# Patient Record
Sex: Male | Born: 1935
Health system: Southern US, Community
[De-identification: ages and names within clinical notes are randomized; demographics above are authoritative.]

## PROBLEM LIST (undated history)

## (undated) DIAGNOSIS — K219 Gastro-esophageal reflux disease without esophagitis: Secondary | ICD-10-CM

## (undated) DIAGNOSIS — J189 Pneumonia, unspecified organism: Secondary | ICD-10-CM

## (undated) DIAGNOSIS — N429 Disorder of prostate, unspecified: Secondary | ICD-10-CM

## (undated) DIAGNOSIS — I499 Cardiac arrhythmia, unspecified: Secondary | ICD-10-CM

## (undated) DIAGNOSIS — K5090 Crohn's disease, unspecified, without complications: Secondary | ICD-10-CM

## (undated) DIAGNOSIS — I509 Heart failure, unspecified: Secondary | ICD-10-CM

## (undated) DIAGNOSIS — G4730 Sleep apnea, unspecified: Secondary | ICD-10-CM

## (undated) DIAGNOSIS — E785 Hyperlipidemia, unspecified: Secondary | ICD-10-CM

## (undated) DIAGNOSIS — I10 Essential (primary) hypertension: Secondary | ICD-10-CM

## (undated) DIAGNOSIS — I219 Acute myocardial infarction, unspecified: Secondary | ICD-10-CM

## (undated) DIAGNOSIS — M1990 Unspecified osteoarthritis, unspecified site: Secondary | ICD-10-CM

## (undated) DIAGNOSIS — I2510 Atherosclerotic heart disease of native coronary artery without angina pectoris: Secondary | ICD-10-CM

## (undated) DIAGNOSIS — F0390 Unspecified dementia without behavioral disturbance: Secondary | ICD-10-CM

## (undated) HISTORY — DX: Gastro-esophageal reflux disease without esophagitis: K21.9

## (undated) HISTORY — PX: CARDIAC CATHETERIZATION: SHX172

## (undated) HISTORY — DX: Unspecified osteoarthritis, unspecified site: M19.90

## (undated) HISTORY — PX: COLONOSCOPY: SHX174

## (undated) HISTORY — DX: Hyperlipidemia, unspecified: E78.5

## (undated) HISTORY — DX: Acute myocardial infarction, unspecified: I21.9

---

## 1974-11-23 HISTORY — PX: KNEE ARTHROSCOPY: SUR90

## 1991-04-25 HISTORY — PX: WRIST SURGERY: SHX841

## 1991-11-24 DIAGNOSIS — J189 Pneumonia, unspecified organism: Secondary | ICD-10-CM

## 1991-11-24 HISTORY — DX: Pneumonia, unspecified organism: J18.9

## 1994-11-23 HISTORY — PX: MENISCUS DEBRIDEMENT: SHX5178

## 2003-07-13 ENCOUNTER — Encounter: Payer: Self-pay | Admitting: Surgery

## 2003-07-13 ENCOUNTER — Encounter: Admission: RE | Admit: 2003-07-13 | Discharge: 2003-07-13 | Payer: Self-pay | Admitting: Surgery

## 2003-07-16 ENCOUNTER — Ambulatory Visit (HOSPITAL_BASED_OUTPATIENT_CLINIC_OR_DEPARTMENT_OTHER): Admission: RE | Admit: 2003-07-16 | Discharge: 2003-07-16 | Payer: Self-pay | Admitting: Surgery

## 2004-12-09 ENCOUNTER — Inpatient Hospital Stay (HOSPITAL_BASED_OUTPATIENT_CLINIC_OR_DEPARTMENT_OTHER): Admission: RE | Admit: 2004-12-09 | Discharge: 2004-12-09 | Payer: Self-pay | Admitting: Interventional Cardiology

## 2007-11-02 ENCOUNTER — Ambulatory Visit: Payer: Self-pay | Admitting: Gastroenterology

## 2007-11-21 DIAGNOSIS — G4730 Sleep apnea, unspecified: Secondary | ICD-10-CM | POA: Insufficient documentation

## 2007-11-21 DIAGNOSIS — D126 Benign neoplasm of colon, unspecified: Secondary | ICD-10-CM

## 2007-11-21 DIAGNOSIS — K219 Gastro-esophageal reflux disease without esophagitis: Secondary | ICD-10-CM | POA: Insufficient documentation

## 2007-11-21 DIAGNOSIS — I2510 Atherosclerotic heart disease of native coronary artery without angina pectoris: Secondary | ICD-10-CM

## 2007-11-21 DIAGNOSIS — I219 Acute myocardial infarction, unspecified: Secondary | ICD-10-CM | POA: Insufficient documentation

## 2007-11-21 DIAGNOSIS — M069 Rheumatoid arthritis, unspecified: Secondary | ICD-10-CM

## 2007-11-24 HISTORY — PX: UMBILICAL HERNIA REPAIR: SHX196

## 2009-10-05 ENCOUNTER — Emergency Department (HOSPITAL_BASED_OUTPATIENT_CLINIC_OR_DEPARTMENT_OTHER): Admission: EM | Admit: 2009-10-05 | Discharge: 2009-10-05 | Payer: Self-pay | Admitting: Emergency Medicine

## 2009-10-05 ENCOUNTER — Ambulatory Visit: Payer: Self-pay | Admitting: Diagnostic Radiology

## 2009-10-10 ENCOUNTER — Ambulatory Visit (HOSPITAL_BASED_OUTPATIENT_CLINIC_OR_DEPARTMENT_OTHER): Admission: RE | Admit: 2009-10-10 | Discharge: 2009-10-10 | Payer: Self-pay | Admitting: Orthopedic Surgery

## 2009-10-10 HISTORY — PX: TRIGGER FINGER RELEASE: SHX641

## 2010-12-29 ENCOUNTER — Other Ambulatory Visit: Payer: Self-pay | Admitting: Gastroenterology

## 2010-12-29 DIAGNOSIS — R109 Unspecified abdominal pain: Secondary | ICD-10-CM

## 2010-12-30 ENCOUNTER — Ambulatory Visit
Admission: RE | Admit: 2010-12-30 | Discharge: 2010-12-30 | Disposition: A | Discharge status: Home / Self Care | Payer: No Typology Code available for payment source | Source: Ambulatory Visit | Attending: Gastroenterology | Admitting: Gastroenterology

## 2010-12-30 DIAGNOSIS — R109 Unspecified abdominal pain: Secondary | ICD-10-CM

## 2010-12-30 MED ORDER — IOHEXOL 300 MG/ML  SOLN
100.0000 mL | Freq: Once | INTRAMUSCULAR | Status: AC | PRN
  Administered 2010-12-30: 100 mL via INTRAVENOUS

## 2011-02-25 LAB — POCT HEMOGLOBIN-HEMACUE: Hemoglobin: 15.5 g/dL (ref 13.0–17.0)

## 2011-02-25 LAB — BASIC METABOLIC PANEL
BUN: 7 mg/dL (ref 6–23)
CO2: 27 mEq/L (ref 19–32)
GFR calc non Af Amer: >60 mL/min (ref >60)
Glucose, Bld: 104 mg/dL — ABNORMAL HIGH (ref 70–99)
Potassium: 4.4 mEq/L (ref 3.5–5.1)
Sodium: 138 mEq/L (ref 135–145)

## 2011-04-10 NOTE — Assessment & Plan Note (Signed)
[place]                         GASTROENTEROLOGY OFFICE NOTE   Anthony, [name]                    MRN:          [id number]  DATE:11/02/2007                            DOB:          1934/10/11    CHIEF COMPLAINT:  77 year old white male, self referred for GERD and a  personal history of colon polyps.   HPI:  Anthony [name] is a very nice man who has been followed by Dr. Nehemiah Settle for years.  Due to health insurance reasons, he needed to change  physicians.  He has had a history of GERD complicated by LPR and  pharyngeal ulcers.  He is followed by Dr. Jaquita Rector of the  Otolaryngology Department at [facility].  He has been maintained on omeprazole q.a.m. and ranitidine  q.h.s.  He has a brother with colon polyps and he has undergone  colonoscopies every five years.  He states his last colonoscopy was  performed by [facility] in April 2007 and showed no  abnormalities.  A five year recall examination was recommended.  He has  had loose stools for many years and his bowel pattern has not changed.  He notes no abdominal pain, weight loss, dysphagia, odynophagia, melena,  hematochezia, change in stool caliber or change in bowel habits.   PAST MEDICAL HISTORY:  1. Status post myocardial infarction 1996.  2. Coronary artery disease  3. Status post angioplasty 1996.  4. Hyperlipidemia.  5. Mild sleep apnea  6. Status post hernia repair 2004.  7. Rheumatoid arthritis since 1980s, maintained on methotrexate.   CURRENT MEDICATIONS:  Listed on the chart, updated and reviewed.   ALLERGIES:  TO AN UNKNOWN EXPERIMENTAL DRUG.   SOCIAL HISTORY/REVIEW OF SYSTEMS:  Per the handwritten form.   PHYSICAL EXAMINATION:  GENERAL:  A well-developed, well-nourished, no  acute distress.  VITAL SIGNS:  Height 6 feet, weight 198.2 pounds, blood pressure 130/76,  pulse 72 and regular.  HEENT:  Anicteric  sclerae, oropharynx clear.  CHEST:  Clear to auscultation bilaterally.  CARDIAC:  Regular rate and rhythm without murmurs.  ABDOMEN:  Soft, nontender, nondistended, normoactive bowel sounds.  No  palpable organomegaly, masses or hernias.  NEUROLOGICAL:  Alert and oriented x3.  Grossly nonfocal.   ASSESSMENT/PLAN:  1. GERD complicated by LPR with pharyngeal ulcers.  Maintain      omeprazole 20 mg p.o. q.a.m. and ranitidine 300 mg q.p.m. as      recommended by his otolaryngologist.  2. Family history of colon polyps, recall colonoscopy recommended for      April 2012.  Will obtain records from his prior gastroenterologist.  3. Rheumatoid arthritis maintained on methotrexate.  I have advised      the patient to discuss his cumulative methotrexate exposure to see      if a liver biopsy is now indicated.  He states he will discuss this      with his rheumatologist at his next visit.  4. Primary care.  The patient is referred to Dr. Phoebe Sharps.     Pricilla Riffle. Fuller Plan, MD, [facility]  Electronically Signed  MTS/MedQ  DD: 11/02/2007  DT: 11/03/2007  Job #: [id number]

## 2011-04-10 NOTE — Op Note (Signed)
NAME:  Anthony [name], Anthony [name]                       ACCOUNT NO.:  [id number]   MEDICAL RECORD NO.:  [id number]                   PATIENT TYPE:  AMB   LOCATION:  [place]                                  FACILITY:  [place]   PHYSICIAN:  Isabel Caprice. Hassell Done, M.D.             DATE OF BIRTH:  Jul 24, 1934   DATE OF PROCEDURE:  07/16/2003  DATE OF DISCHARGE:                                 OPERATIVE REPORT   PREOPERATIVE DIAGNOSIS:  Umbilical hernia.   POSTOPERATIVE DIAGNOSIS:  Umbilical hernia incarcerated with preperitoneal  fat.   PROCEDURE:  Umbilical herniorrhaphy repaired with mesh.   SURGEON:  Isabel Caprice. Hassell Done, M.D.   ANESTHESIA:  General by LMA.   DESCRIPTION OF PROCEDURE:  [name] hernia was marked in the holding  area and he was taken back to room 4 at [facility] day surgery and  given general anesthesia. The abdomen was prepped with Betadine and draped  sterilely.   A curvilinear infraumbilical incision was made and taken down through the  tissue. Using scissors I dissected an obvious hernia away from the  surrounding  fascia and found the neck  which was stuck up to the umbilicus  and distorting it. I went right down to the fascia and cut through the  hernia sac and found fat, and then went around the hernia sac, freeing the  umbilicus completely. In so doing I was able to then reduce the incarcerated  hernia which was preperitoneal fat, and when I reduced it easily returned  into the abdomen. I did not expose the abdominal viscera, and it seemed  to  be that I did not have any violation of the peritoneum.   Once the hernia was completely reduced and I had margins above and below  this, I cut a piece of mesh approximately 2 cm long in a fairly modest  narrow strip and then slit it into position  on either  end with a simple  suture, taking purchase beyond the hernia, getting a purchase full thickness  in and out of the mesh and then back out through the fascia. This  sort of  anchored it at either end.   Then I incorporated the mesh in simple bites, again closing  this hernia  defect transversely. The 0 Prolene that I used was tied down with 7 knots  and completely closed the hernia obliterating  the space. A good repair was  intact. Again I did not appear to be within the abdominal cavity, but it  seemed to be that the hernia was all preperitoneal.   The umbilical defect was then tacked down to the fascia with a simple 4-0  Vicryl. The wound was closed with 4-0 Vicryl and 5-0 Vicryl and with Benzoin  and Steri-Strips. The area was injected with 0.5% Marcaine prior to closing  and then the patient was taken to the recovery room in satisfactory  condition.  He will be given Vicodin  for pain. He will be followed up in the office in  about 2 to 3 weeks.                                               Isabel Caprice Hassell Done, M.D.    MBM/MEDQ  D:  07/16/2003  T:  07/16/2003  Job:  [id number]   cc:   Nehemiah Settle, M.D.  [address] [address]  Fax: [phone]   Belva Crome III, M.D.  [place]  [address] [address]  Fax: [place]

## 2011-04-10 NOTE — Cardiovascular Report (Signed)
NAME:  Anthony [name], Anthony [name] NO.:  [id number]   MEDICAL RECORD NO.:  [id number]          PATIENT TYPE:  OIB   LOCATION:  6501                         FACILITY:  [place]   PHYSICIAN:  Belva Crome III, M.D.DATE OF BIRTH:  November 25, 1933   DATE OF PROCEDURE:  12/09/2004  DATE OF DISCHARGE:                              CARDIAC CATHETERIZATION   INDICATIONS FOR STUDY:  1.  History of previous myocardial infarction treated with angioplasty of      the LAD 1996. Recent Cardiolite study demonstrated a large area of      relatively fixed anterior wall defect consistent with prior anterior      infarction and suggestion of mid to inferior lateral ischemia versus      diaphragmatic attenuation. There was anterolateral diminished      thickening. The study is being done to rule out silent      restenosis/occlusion of the diagonal and to exclude distal circumflex      for right coronary disease causing ischemia.   PROCEDURE PERFORMED:  1.  Left heart catheterization.  2.  Selective coronary angiography.  3.  Left ventriculography.   DESCRIPTION:  After informed consent, a 6-French  sheath was placed in the  right femoral artery using the modified Seldinger technique. A 4-French A2  multipurpose catheter was then used for hemodynamic recordings, left  ventriculography by hand injection, and selective right coronary  angiography.  1.  Left Judkins catheter was used for left coronary angiography.  The      patient tolerated procedure without complications.   RESULTS:  I:  Hemodynamic data:  a.  Left ventricular pressure 134/15.  b.  Aortic pressure 134/68.   II:  LEFT VENTRICULOGRAPHY:  The left ventricle was normal in size.  There  is  a suggestion of mild anterior wall hypokinesis.  Overall LV function,  however, was felt to be normal.  EF is at least 60%.  No MR is noted.   1.  CORONARY ANGIOGRAPHY:      1.  Left main coronary: Mild calcification is noted.  Widely  patent/normal.      2.  Left anterior descending coronary artery:  Some calcification is          noted in the proximal and mid vessel. The LAD contains          irregularities with up to 20-30% narrowing in the proximal and mid          vessel. The LAD wraps around the left ventricular apex.  The          diagonal is large and bifurcates on the left lateral wall. There are          tandem 70% stenoses within a bend in the diagonal, probably          representing a perhaps a chronic stable dissection from the prior          angioplasty.  The diagonal is a large vessel that supplies the          anterolateral and mid lateral wall.      3.  Circumflex artery:  The circumflex coronary artery is large.  It          gives origin to two obtuse marginal branches which predominantly          supply the inferolateral wall.  There is tortuosity in the mid          vessel with up to 20-30% narrowing.  No high-grade obstruction is          felt to be present.      4.  Right coronary: The right coronary artery is a large dominant vessel          giving PDA left ventricular branches and two acute marginal branch.          Irregularities with up to 20-30% narrowing are noted. No significant          obstruction is seen in the RCA.   CONCLUSIONS:  1.  Borderline but probably significant restenosis of the first diagonal      which is the site of the previous infarction greater than 10 years ago.      The main body of the LAD, the  circumflex and right coronary free of any      significant obstruction.  2.  Overall normal LVEF with mild anterior wall hypokinesis.   PLAN:  Medical therapy.  Consider PCI of the diagonal should symptoms  develop.       HWS/MEDQ  D:  12/09/2004  T:  12/09/2004  Job:  [id number]   cc:   Nehemiah Settle, M.D.  [address]  [place] [address]  Fax: [phone]

## 2011-05-23 ENCOUNTER — Emergency Department (HOSPITAL_COMMUNITY)
Admission: EM | Admit: 2011-05-23 | Discharge: 2011-05-24 | Disposition: A | Discharge status: Home / Self Care | Payer: Medicare Other | Attending: Emergency Medicine | Admitting: Emergency Medicine

## 2011-05-23 ENCOUNTER — Emergency Department (HOSPITAL_COMMUNITY): Payer: Medicare Other

## 2011-05-23 DIAGNOSIS — IMO0002 Reserved for concepts with insufficient information to code with codable children: Secondary | ICD-10-CM | POA: Insufficient documentation

## 2011-05-23 DIAGNOSIS — K219 Gastro-esophageal reflux disease without esophagitis: Secondary | ICD-10-CM | POA: Insufficient documentation

## 2011-05-23 DIAGNOSIS — M545 Low back pain, unspecified: Secondary | ICD-10-CM | POA: Insufficient documentation

## 2011-05-23 DIAGNOSIS — W11XXXA Fall on and from ladder, initial encounter: Secondary | ICD-10-CM | POA: Insufficient documentation

## 2011-05-23 DIAGNOSIS — M069 Rheumatoid arthritis, unspecified: Secondary | ICD-10-CM | POA: Insufficient documentation

## 2011-05-23 DIAGNOSIS — E785 Hyperlipidemia, unspecified: Secondary | ICD-10-CM | POA: Insufficient documentation

## 2011-05-23 DIAGNOSIS — N400 Enlarged prostate without lower urinary tract symptoms: Secondary | ICD-10-CM | POA: Insufficient documentation

## 2011-05-23 DIAGNOSIS — M25519 Pain in unspecified shoulder: Secondary | ICD-10-CM | POA: Insufficient documentation

## 2011-05-23 DIAGNOSIS — I252 Old myocardial infarction: Secondary | ICD-10-CM | POA: Insufficient documentation

## 2011-05-23 DIAGNOSIS — I10 Essential (primary) hypertension: Secondary | ICD-10-CM | POA: Insufficient documentation

## 2011-05-24 ENCOUNTER — Emergency Department (HOSPITAL_COMMUNITY): Payer: Medicare Other

## 2011-05-28 ENCOUNTER — Other Ambulatory Visit (HOSPITAL_COMMUNITY): Payer: Self-pay | Admitting: Orthopedic Surgery

## 2011-05-28 DIAGNOSIS — M542 Cervicalgia: Secondary | ICD-10-CM

## 2011-05-29 ENCOUNTER — Ambulatory Visit (HOSPITAL_COMMUNITY)
Admission: RE | Admit: 2011-05-29 | Discharge: 2011-05-29 | Disposition: A | Discharge status: Home / Self Care | Payer: Medicare Other | Source: Ambulatory Visit | Attending: Orthopedic Surgery | Admitting: Orthopedic Surgery

## 2011-05-29 DIAGNOSIS — M47812 Spondylosis without myelopathy or radiculopathy, cervical region: Secondary | ICD-10-CM | POA: Insufficient documentation

## 2011-05-29 DIAGNOSIS — M459 Ankylosing spondylitis of unspecified sites in spine: Secondary | ICD-10-CM | POA: Insufficient documentation

## 2011-05-29 DIAGNOSIS — M545 Low back pain, unspecified: Secondary | ICD-10-CM | POA: Insufficient documentation

## 2011-05-29 DIAGNOSIS — M542 Cervicalgia: Secondary | ICD-10-CM

## 2011-05-29 DIAGNOSIS — M4180 Other forms of scoliosis, site unspecified: Secondary | ICD-10-CM | POA: Insufficient documentation

## 2011-05-29 DIAGNOSIS — M069 Rheumatoid arthritis, unspecified: Secondary | ICD-10-CM | POA: Insufficient documentation

## 2011-05-29 DIAGNOSIS — R29898 Other symptoms and signs involving the musculoskeletal system: Secondary | ICD-10-CM | POA: Insufficient documentation

## 2011-06-01 ENCOUNTER — Ambulatory Visit: Payer: No Typology Code available for payment source

## 2011-06-05 ENCOUNTER — Other Ambulatory Visit: Payer: Self-pay | Admitting: Internal Medicine

## 2011-06-05 DIAGNOSIS — E041 Nontoxic single thyroid nodule: Secondary | ICD-10-CM

## 2011-06-08 ENCOUNTER — Ambulatory Visit
Admission: RE | Admit: 2011-06-08 | Discharge: 2011-06-08 | Disposition: A | Discharge status: Home / Self Care | Payer: Medicare Other | Source: Ambulatory Visit | Attending: Internal Medicine | Admitting: Internal Medicine

## 2011-06-08 DIAGNOSIS — E041 Nontoxic single thyroid nodule: Secondary | ICD-10-CM

## 2011-06-30 ENCOUNTER — Other Ambulatory Visit: Payer: Self-pay | Admitting: Orthopedic Surgery

## 2011-07-03 ENCOUNTER — Other Ambulatory Visit: Payer: Medicare Other

## 2011-07-03 ENCOUNTER — Other Ambulatory Visit: Payer: Self-pay | Admitting: Orthopedic Surgery

## 2011-07-03 ENCOUNTER — Ambulatory Visit
Admission: RE | Admit: 2011-07-03 | Discharge: 2011-07-03 | Disposition: A | Discharge status: Home / Self Care | Payer: Medicare Other | Source: Ambulatory Visit | Attending: Orthopedic Surgery | Admitting: Orthopedic Surgery

## 2011-07-14 ENCOUNTER — Inpatient Hospital Stay (HOSPITAL_BASED_OUTPATIENT_CLINIC_OR_DEPARTMENT_OTHER)
Admission: RE | Admit: 2011-07-14 | Discharge: 2011-07-14 | Disposition: A | Discharge status: Home / Self Care | Payer: Medicare Other | Source: Ambulatory Visit | Attending: Interventional Cardiology | Admitting: Interventional Cardiology

## 2011-07-14 DIAGNOSIS — I519 Heart disease, unspecified: Secondary | ICD-10-CM | POA: Insufficient documentation

## 2011-07-14 DIAGNOSIS — R55 Syncope and collapse: Secondary | ICD-10-CM | POA: Insufficient documentation

## 2011-07-14 DIAGNOSIS — I2510 Atherosclerotic heart disease of native coronary artery without angina pectoris: Secondary | ICD-10-CM | POA: Insufficient documentation

## 2011-07-24 NOTE — Cardiovascular Report (Signed)
NAME:  Anthony [name], Anthony [name] NO.:  [id number]  MEDICAL RECORD NO.:  [id number]  LOCATION:                                 FACILITY:  PHYSICIAN:  Belva Crome, M.D.   DATE OF BIRTH:  11/17/1934  DATE OF PROCEDURE:  07/14/2011 DATE OF DISCHARGE:                           CARDIAC CATHETERIZATION   INDICATIONS FOR PROCEDURE:  Recent episode of syncope/altered mental status and recent Holter monitor demonstrating sustained VT up to 18 beats, asymptomatic.  Recent nuclear study demonstrating a large anterior wall scar with reduction in ejection fraction into the mid 40s from previously preserved EF.  PROCEDURE PERFORMED: 1. Left heart cath. 2. Selective coronary angio. 3. Left ventriculography.  DESCRIPTION:  After informed consent, 2 mg of IV Versed and 50 mcg of fentanyl, a 4-French sheath was placed in the right femoral artery using modified Seldinger technique.  A 1% Xylocaine was used for local anesthesia.  A 4-French A2 multipurpose catheter was used for hemodynamic recordings, left ventriculography by hand and power injection, and right coronary angiography.  Left coronary angiography was performed with a 4-French #4 left Judkins catheter.  The patient tolerated the procedure without complications.  RESULTS: 1. Hemodynamic data:     a.     Aortic pressure 149/59 mmHg.     b.     Left ventricular pressure 104/7 mmHg. 2. Coronary angiography:  The coronary arteries are heavily calcified.     The entire right coronary proximal and mid segments as well as the     distal left main proximal and mid LAD and first diagonal.     a.     Left main coronary:  Widely patent.     b.     Left anterior descending coronary:  The left anterior      descending coronary artery as mentioned before is heavily      calcified.  The vessel contains irregularities throughout the      proximal segment.  After the first septal perforator, there is      eccentric 40-50% narrowing  LAD wraps around the left ventricular      apex.  The first diagonal is large and bifurcates on the left      lateral wall.  It is heavily calcified.  There is focal mid 70%      stenosis which is eccentric, making it difficult to fully assess.      This obstruction in some views appears to be less than 60% and in      other views perhaps 80%.  The diagonal bifurcates into two      moderate-sized vessel.     c.     Circumflex artery:  The circumflex coronary artery is      tortuous.  Proximal calcification is noted.  There is mid vessel      less than 50% obstruction.  Two obtuse marginals are widely      patent.     d.     Right coronary:  The right coronary artery arises      anteriorly.  There is ostial 30% narrowing and midvessel 30-40%      narrowing.  No high-grade  obstruction seen.  CONCLUSION: 1. Moderate coronary artery disease with 50-70% eccentric mid LAD     within a calcified segment and 60-70% proximal diagonal #1     obstruction.  The diagonals also calcified.  The LAD is large and     wraps around the left ventricular apex.  The mid circumflex and mid     right coronary contains less than 50% eccentric narrowing. 2. Left ventricular dysfunction with mid-to-distal anterior wall     severe akinesis/possibly focal aneurysm     formation.  EF 40 4%.  LVEDP is normal. 3. Monomorphic ventricular tachycardia, nonsustained (18 beats) likely     scar related.  We will add beta blocker therapy and improve heart failure therapy. Consider evaluation by EP.     Belva Crome, M.D.     HWS/MEDQ  D:  07/14/2011  T:  07/14/2011  Job:  [id number]  cc:   Nehemiah Settle, M.D. [facility]  Electronically Signed by Daneen Schick M.D. on 07/24/2011 04:22:18 PM

## 2011-08-05 ENCOUNTER — Ambulatory Visit (INDEPENDENT_AMBULATORY_CARE_PROVIDER_SITE_OTHER): Payer: Medicare Other | Admitting: Surgery

## 2011-08-05 ENCOUNTER — Encounter (INDEPENDENT_AMBULATORY_CARE_PROVIDER_SITE_OTHER): Payer: Self-pay | Admitting: Surgery

## 2011-08-05 VITALS — BP 126/72 | HR 80 | Temp 97.4°F | Ht 72.0 in | Wt 190.2 lb

## 2011-08-05 DIAGNOSIS — K4090 Unilateral inguinal hernia, without obstruction or gangrene, not specified as recurrent: Secondary | ICD-10-CM

## 2011-08-05 NOTE — Progress Notes (Signed)
Anthony [name] came today for an appt regarding Anthony [name] persistant right inguinal pain.  I had previously done an umbilical hernia repair on him.  On exam Anthony [name] testes are normal bilaterally. He has a bulge on the right side that is high in the canal. He has a faint one on the left side.  Anthony [name] pain is on the right and at times is very severe. CT scan to Dr. Marlou Sa had ordered showed a tiny fat hernia on the right side. It could be that fat in Anthony [name] hernia had gotten swollen after Anthony [name] catheter for Anthony [name] recent heart attack producing some pain from that being squeezed. This could be the cycle of edema leading to squeezing leading to pain etc.  In any case I don't think he has any immediate threats regarding bowel impingement. If he continues to have this pain then we should do a right inguinal hernia repair.  However, Anthony [name] recent MI would make me want to wait a few months before offering such surgery. I would like for him to see Dr. [name] at Anthony [name] regularly scheduled appointment in October and make sure it's okay for [place] to proceed with elective surgery before putting him to sleep and doing a right inguinal herniorrhaphy.  Plan:   See [name] in October. If OK to go to surgery we'll plan to proceed with open right inguinal hernia repair with mesh.

## 2011-09-17 ENCOUNTER — Ambulatory Visit (INDEPENDENT_AMBULATORY_CARE_PROVIDER_SITE_OTHER): Payer: Medicare Other | Admitting: Surgery

## 2011-09-25 ENCOUNTER — Ambulatory Visit (INDEPENDENT_AMBULATORY_CARE_PROVIDER_SITE_OTHER): Payer: Medicare Other | Admitting: Surgery

## 2011-09-25 ENCOUNTER — Encounter (INDEPENDENT_AMBULATORY_CARE_PROVIDER_SITE_OTHER): Payer: Self-pay | Admitting: Surgery

## 2011-09-25 VITALS — BP 118/66 | HR 64 | Temp 97.4°F | Resp 16 | Ht 72.0 in | Wt 188.2 lb

## 2011-09-25 DIAGNOSIS — K4090 Unilateral inguinal hernia, without obstruction or gangrene, not specified as recurrent: Secondary | ICD-10-CM

## 2011-09-25 NOTE — Progress Notes (Signed)
Chief Complaint:  Right inguinal hernia  History of Present Illness:  Anthony [name] is an 77 y.o. male with intermittent right inguinal pain that at times a sharp. Just been actually order the scan that showed this little hernia the now on my exam he is a little more of a bulge that I think is a right indirect inguinal hernia. I discussed repair with mesh with him and his wife and we will get this scheduled since it's now been far enough out from his MI. His primary is Dr. Carlena Sax and his cardiologist as [name].   Past Medical History  Diagnosis Date  . Arthritis   . GERD (gastroesophageal reflux disease)   . Hyperlipidemia   . Heart attack 06/2011, 05/2011    Past Surgical History  Procedure Date  . Hernia repair 5009    umbilical  . Wrist surgery 04/25/91    left  . Knee arthroscopy 1976    right  . Meniscus debridement 1996    left  . Trigger finger release 10/10/2009    rt    Medications Prior to Admission  Medication Sig Dispense Refill  . Ascorbic Acid (VITAMIN C) 1000 MG tablet Take 1,000 mg by mouth daily.        Marland Kitchen aspirin 325 MG tablet Take 325 mg by mouth daily.        . beclomethasone (BECONASE-AQ) 42 MCG/SPRAY nasal spray Place 2 sprays into the nose 2 (two) times daily. Dose is for each nostril.       Marland Kitchen CALCIUM-VITAMIN D PO Take by mouth daily.        . carvedilol (COREG) 6.25 MG tablet BID times 48H.      . finasteride (PROSCAR) 5 MG tablet Every other day.      . folic acid (FOLVITE) 381 MCG tablet Take 400 mcg by mouth 2 (two) times daily.        . Glucosamine-Chondroit-Vit C-Mn (GLUCOSAMINE CHONDR 1500 COMPLX PO) Take by mouth daily.        . methotrexate (RHEUMATREX) 2.5 MG tablet 8 tablets Once a week.      . nitroGLYCERIN (NITROSTAT) 0.4 MG SL tablet Place 0.4 mg under the tongue every 5 (five) minutes as needed.        Marland Kitchen omeprazole (PRILOSEC) 20 MG capsule Ad lib.      . ranitidine (ZANTAC) 300 MG capsule At bedtime.      . Tamsulosin HCl (FLOMAX)  0.4 MG CAPS Every other day.      . tolterodine (DETROL) 2 MG tablet Take 2 mg by mouth as needed.        . traMADol (ULTRAM) 50 MG tablet Ad lib.      . Vitamins A & D (VITAMIN A & D) 10000-400 UNITS CAPS Take by mouth daily.         No current facility-administered medications on file as of 09/25/2011.     No Known Allergies   No family history on file.  Social History:   reports that he has quit smoking. He has never used smokeless tobacco. He reports that he does not drink alcohol or use illicit drugs.   REVIEW OF SYSTEMS - PERTINENT POSITIVES ONLY: Review of systems is positive for glasses, some heart disease, intermittent prostate problems, arthritis,.  Physical Exam:   Blood pressure 118/66, pulse 64, temperature 97.4 F (36.3 C), temperature source Temporal, resp. rate 16, height 6' (1.829 m), weight 188 lb 3.2 oz (85.367 kg). Body mass  index is 25.52 kg/(m^2).  Gen:  No acute distress.  Well nourished and well groomed.   Neurological: Alert and oriented to person, place, and time. Coordination normal.  Head: Normocephalic and atraumatic.  Eyes: Conjunctivae are normal. Pupils are equal, round, and reactive to light. No scleral icterus.  Neck: Normal range of motion. Neck supple. No tracheal deviation or thyromegaly present. No bruits Cardiovascular: Normal rate, regular rhythm, normal heart sounds and intact distal pulses.  Exam reveals no gallop and no friction rub.  No murmur heard. Respiratory: Effort normal.  No respiratory distress. No chest wall tenderness. Breath sounds normal.  No wheezes, rales or rhonchi.  GI: Soft. Bowel sounds are normal. The abdomen is soft and nontender.  There is no rebound and no guarding. There is a soft bulge in the right inguinal region consistent with inguinal hernis Musculoskeletal: Normal range of motion. Extremities are nontender.  Lymphadenopathy: No cervical, preauricular, postauricular or axillary adenopathy is present Skin: Skin  is warm and dry. No rash noted. No diaphoresis. No erythema. No pallor. No clubbing, cyanosis, or edema.   Psychiatric: Normal mood and affect. Behavior is normal. Judgment and thought content normal.    LABORATORY RESULTS: No results found for this or any previous visit (from the past 48 hour(s)).  RADIOLOGY RESULTS: No results found.  Problem List: Active Problems:  * No active hospital problems. *    Assessment & Plan: Symptomatic right inguinal hernia    Matt B. Hassell Done, MD, [facility] [phone] beeper [phone]  09/25/2011 4:18 PM

## 2011-09-28 ENCOUNTER — Other Ambulatory Visit (INDEPENDENT_AMBULATORY_CARE_PROVIDER_SITE_OTHER): Payer: Self-pay | Admitting: Surgery

## 2011-09-29 ENCOUNTER — Other Ambulatory Visit (INDEPENDENT_AMBULATORY_CARE_PROVIDER_SITE_OTHER): Payer: Self-pay | Admitting: Surgery

## 2011-10-28 ENCOUNTER — Other Ambulatory Visit (INDEPENDENT_AMBULATORY_CARE_PROVIDER_SITE_OTHER): Payer: Self-pay | Admitting: Surgery

## 2011-10-29 ENCOUNTER — Encounter (HOSPITAL_COMMUNITY): Payer: Self-pay | Admitting: Pharmacy Technician

## 2011-11-02 ENCOUNTER — Ambulatory Visit (HOSPITAL_COMMUNITY)
Admission: RE | Admit: 2011-11-02 | Discharge: 2011-11-02 | Disposition: A | Discharge status: Home / Self Care | Payer: Medicare Other | Source: Ambulatory Visit | Attending: Surgery | Admitting: Surgery

## 2011-11-02 ENCOUNTER — Encounter (HOSPITAL_COMMUNITY): Payer: Self-pay

## 2011-11-02 ENCOUNTER — Encounter (HOSPITAL_COMMUNITY)
Admission: RE | Admit: 2011-11-02 | Discharge: 2011-11-02 | Disposition: A | Discharge status: Home / Self Care | Payer: Medicare Other | Source: Ambulatory Visit | Attending: Surgery | Admitting: Surgery

## 2011-11-02 DIAGNOSIS — K469 Unspecified abdominal hernia without obstruction or gangrene: Secondary | ICD-10-CM | POA: Insufficient documentation

## 2011-11-02 DIAGNOSIS — Z01812 Encounter for preprocedural laboratory examination: Secondary | ICD-10-CM | POA: Insufficient documentation

## 2011-11-02 DIAGNOSIS — Z01818 Encounter for other preprocedural examination: Secondary | ICD-10-CM | POA: Insufficient documentation

## 2011-11-02 HISTORY — DX: Cardiac arrhythmia, unspecified: I49.9

## 2011-11-02 HISTORY — DX: Essential (primary) hypertension: I10

## 2011-11-02 HISTORY — DX: Heart failure, unspecified: I50.9

## 2011-11-02 HISTORY — DX: Sleep apnea, unspecified: G47.30

## 2011-11-02 HISTORY — DX: Atherosclerotic heart disease of native coronary artery without angina pectoris: I25.10

## 2011-11-02 HISTORY — DX: Disorder of prostate, unspecified: N42.9

## 2011-11-02 HISTORY — DX: Pneumonia, unspecified organism: J18.9

## 2011-11-02 LAB — BASIC METABOLIC PANEL
BUN: 10 mg/dL (ref 6–23)
Chloride: 103 mEq/L (ref 96–112)
GFR calc Af Amer: >90 mL/min (ref >90)
GFR calc non Af Amer: 83 mL/min — ABNORMAL LOW (ref >90)
Potassium: 4.3 mEq/L (ref 3.5–5.1)
Sodium: 140 mEq/L (ref 135–145)

## 2011-11-02 LAB — SURGICAL PCR SCREEN
MRSA, PCR: NEGATIVE
Staphylococcus aureus: NEGATIVE

## 2011-11-02 LAB — CBC
HCT: 40.9 % (ref 39.0–52.0)
MCHC: 34.2 g/dL (ref 30.0–36.0)
Platelets: 184 10*3/uL (ref 150–400)
RDW: 15 % (ref 11.5–15.5)
WBC: 5.7 10*3/uL (ref 4.0–10.5)

## 2011-11-02 NOTE — Patient Instructions (Signed)
20 Anthony [name]  11/02/2011   Your procedure is scheduled on:  10/1211/12   Wednesday  1030-1200  Report to [place] at 0800 AM.  Call this number if you have problems the morning of surgery: [phone]     Or   Anthony [name]   pst [id number]   Remember:   Do not eat food:After Midnight. Tuesday NIGHT  May have clear liquids:until Midnight .  Tuesday NIGHT  Clear liquids include soda, tea, black coffee, apple or grape juice, broth.  Take these medicines the morning of surgery with A SIP OF WATER:  Carvedilol, Omeprazole  With sip water,   Nitroquick, Tramadol,  Or Hydrocodone if needed              No aspirin, inti inflammatories of herbal meds 5 days before surgery  Do not wear jewelry, make-up or nail polish.  Do not wear lotions, powders, or perfumes. You may wear deodorant.  Do not shave 48 hours prior to surgery.  Do not bring valuables to the hospital.  Contacts, dentures or bridgework may not be worn into surgery.  Leave suitcase in the car. After surgery it may be brought to your room.  For patients admitted to the hospital, checkout time is 11:00 AM the day of discharge.             FLEETS ENEMA NIGHT BEFORE SURGERY BY RECTUM  Patients discharged the day of surgery will not be allowed to drive home.  Name and phone number of your driver: wife  Special Instructions: CHG Shower Use Special Wash: 1/2 bottle night before surgery and 1/2 bottle morning of surgery.   Regular soap face and privates   Please read over the following fact sheets that you were given: MRSA Information

## 2011-11-04 ENCOUNTER — Encounter (HOSPITAL_COMMUNITY)
Admission: RE | Disposition: A | Discharge status: Home / Self Care | Payer: Self-pay | Source: Ambulatory Visit | Attending: Surgery

## 2011-11-04 ENCOUNTER — Encounter (HOSPITAL_COMMUNITY): Payer: Self-pay | Admitting: Anesthesiology

## 2011-11-04 ENCOUNTER — Ambulatory Visit (HOSPITAL_COMMUNITY): Payer: Medicare Other | Admitting: Anesthesiology

## 2011-11-04 ENCOUNTER — Encounter (HOSPITAL_COMMUNITY): Payer: Self-pay | Admitting: *Deleted

## 2011-11-04 ENCOUNTER — Ambulatory Visit: Admit: 2011-11-04 | Payer: Self-pay | Admitting: Surgery

## 2011-11-04 ENCOUNTER — Ambulatory Visit (HOSPITAL_COMMUNITY)
Admission: RE | Admit: 2011-11-04 | Discharge: 2011-11-04 | Disposition: A | Discharge status: Home / Self Care | Payer: Medicare Other | Source: Ambulatory Visit | Attending: Surgery | Admitting: Surgery

## 2011-11-04 DIAGNOSIS — G4730 Sleep apnea, unspecified: Secondary | ICD-10-CM

## 2011-11-04 DIAGNOSIS — K4090 Unilateral inguinal hernia, without obstruction or gangrene, not specified as recurrent: Secondary | ICD-10-CM

## 2011-11-04 DIAGNOSIS — D126 Benign neoplasm of colon, unspecified: Secondary | ICD-10-CM

## 2011-11-04 DIAGNOSIS — K219 Gastro-esophageal reflux disease without esophagitis: Secondary | ICD-10-CM

## 2011-11-04 DIAGNOSIS — E785 Hyperlipidemia, unspecified: Secondary | ICD-10-CM | POA: Insufficient documentation

## 2011-11-04 DIAGNOSIS — Z79899 Other long term (current) drug therapy: Secondary | ICD-10-CM | POA: Insufficient documentation

## 2011-11-04 DIAGNOSIS — I252 Old myocardial infarction: Secondary | ICD-10-CM | POA: Insufficient documentation

## 2011-11-04 DIAGNOSIS — M069 Rheumatoid arthritis, unspecified: Secondary | ICD-10-CM

## 2011-11-04 DIAGNOSIS — Z7982 Long term (current) use of aspirin: Secondary | ICD-10-CM | POA: Insufficient documentation

## 2011-11-04 DIAGNOSIS — M129 Arthropathy, unspecified: Secondary | ICD-10-CM | POA: Insufficient documentation

## 2011-11-04 DIAGNOSIS — I219 Acute myocardial infarction, unspecified: Secondary | ICD-10-CM

## 2011-11-04 DIAGNOSIS — I2510 Atherosclerotic heart disease of native coronary artery without angina pectoris: Secondary | ICD-10-CM

## 2011-11-04 HISTORY — PX: INGUINAL HERNIA REPAIR: SHX194

## 2011-11-04 SURGERY — REPAIR, HERNIA, INGUINAL, ADULT
Anesthesia: General | Laterality: Right

## 2011-11-04 SURGERY — REPAIR, HERNIA, INGUINAL, ADULT
Anesthesia: General | Laterality: Right | Wound class: Clean

## 2011-11-04 MED ORDER — BUPIVACAINE LIPOSOME 1.3 % IJ SUSP
INTRAMUSCULAR | Status: DC | PRN
  Administered 2011-11-04: 14 mL

## 2011-11-04 MED ORDER — OXYCODONE-ACETAMINOPHEN 5-325 MG PO TABS: 1.0000 | ORAL_TABLET | ORAL | Status: AC | PRN

## 2011-11-04 MED ORDER — OXYCODONE-ACETAMINOPHEN 5-325 MG PO TABS
1.0000 | ORAL_TABLET | ORAL | Status: AC | PRN
  Administered 2011-11-04: 1 via ORAL

## 2011-11-04 MED ORDER — PROMETHAZINE HCL 25 MG/ML IJ SOLN: 6.2500 mg | INTRAMUSCULAR | Status: DC | PRN

## 2011-11-04 MED ORDER — FENTANYL CITRATE 0.05 MG/ML IJ SOLN: 25.0000 ug | INTRAMUSCULAR | Status: DC | PRN

## 2011-11-04 MED ORDER — HEPARIN SODIUM (PORCINE) 5000 UNIT/ML IJ SOLN
5000.0000 [IU] | Freq: Once | INTRAMUSCULAR | Status: AC
  Administered 2011-11-04: 5000 [IU] via SUBCUTANEOUS

## 2011-11-04 MED ORDER — FENTANYL CITRATE 0.05 MG/ML IJ SOLN
INTRAMUSCULAR | Status: DC | PRN
  Administered 2011-11-04 (×2): 50 ug via INTRAVENOUS

## 2011-11-04 MED ORDER — BUPIVACAINE LIPOSOME 1.3 % IJ SUSP
20.0000 mL | Freq: Once | INTRAMUSCULAR | Status: DC
  Filled 2011-11-04: qty 20

## 2011-11-04 MED ORDER — LACTATED RINGERS IV SOLN
INTRAVENOUS | Status: DC
  Administered 2011-11-04: 13:00:00 via INTRAVENOUS
  Administered 2011-11-04: 1000 mL via INTRAVENOUS

## 2011-11-04 MED ORDER — SODIUM CHLORIDE 0.9 % IR SOLN
Status: DC | PRN
  Administered 2011-11-04: 1000 mL

## 2011-11-04 MED ORDER — EPHEDRINE SULFATE 50 MG/ML IJ SOLN
INTRAMUSCULAR | Status: DC | PRN
  Administered 2011-11-04 (×2): 2.5 mg via INTRAVENOUS

## 2011-11-04 MED ORDER — OXYCODONE-ACETAMINOPHEN 5-325 MG PO TABS
ORAL_TABLET | ORAL | Status: AC
  Filled 2011-11-04: qty 1

## 2011-11-04 MED ORDER — MIDAZOLAM HCL 5 MG/5ML IJ SOLN
INTRAMUSCULAR | Status: DC | PRN
  Administered 2011-11-04 (×2): 1 mg via INTRAVENOUS

## 2011-11-04 MED ORDER — PROPOFOL 10 MG/ML IV EMUL
INTRAVENOUS | Status: DC | PRN
  Administered 2011-11-04: 175 mg via INTRAVENOUS

## 2011-11-04 MED ORDER — CEFAZOLIN SODIUM-DEXTROSE 2-3 GM-% IV SOLR
INTRAVENOUS | Status: AC
  Filled 2011-11-04: qty 50

## 2011-11-04 MED ORDER — CEFAZOLIN SODIUM-DEXTROSE 2-3 GM-% IV SOLR
2.0000 g | Freq: Once | INTRAVENOUS | Status: AC
  Administered 2011-11-04: 2 g via INTRAVENOUS

## 2011-11-04 SURGICAL SUPPLY — 45 items
ADH SKN CLS APL DERMABOND .7 (GAUZE/BANDAGES/DRESSINGS) ×1
APL SKNCLS STERI-STRIP NONHPOA (GAUZE/BANDAGES/DRESSINGS) ×1
BENZOIN TINCTURE PRP APPL 2/3 (GAUZE/BANDAGES/DRESSINGS) ×2 IMPLANT
BLADE HEX COATED 2.75 (ELECTRODE) ×2 IMPLANT
BLADE SURG 15 STRL LF DISP TIS (BLADE) ×1 IMPLANT
BLADE SURG 15 STRL SS (BLADE) ×2
BLADE SURG SZ10 CARB STEEL (BLADE) ×2 IMPLANT
CANISTER SUCTION 2500CC (MISCELLANEOUS) ×2 IMPLANT
CLOTH BEACON ORANGE TIMEOUT ST (SAFETY) ×2 IMPLANT
DECANTER SPIKE VIAL GLASS SM (MISCELLANEOUS) ×1 IMPLANT
DERMABOND ADVANCED (GAUZE/BANDAGES/DRESSINGS) ×1
DERMABOND ADVANCED .7 DNX12 (GAUZE/BANDAGES/DRESSINGS) IMPLANT
DISSECTOR ROUND CHERRY 3/8 STR (MISCELLANEOUS) IMPLANT
DRAIN PENROSE 18X1/2 LTX STRL (DRAIN) ×2 IMPLANT
DRAPE LAPAROTOMY TRNSV 102X78 (DRAPE) ×2 IMPLANT
DRSG TEGADERM 2-3/8X2-3/4 SM (GAUZE/BANDAGES/DRESSINGS) ×1 IMPLANT
ELECT REM PT RETURN 9FT ADLT (ELECTROSURGICAL) ×2
ELECTRODE REM PT RTRN 9FT ADLT (ELECTROSURGICAL) ×1 IMPLANT
GAUZE SPONGE 4X4 16PLY XRAY LF (GAUZE/BANDAGES/DRESSINGS) IMPLANT
GLOVE BIOGEL M 8.0 STRL (GLOVE) ×2 IMPLANT
GLOVE BIOGEL PI IND STRL 7.0 (GLOVE) ×1 IMPLANT
GLOVE BIOGEL PI INDICATOR 7.0 (GLOVE) ×1
GOWN STRL NON-REIN LRG LVL3 (GOWN DISPOSABLE) ×2 IMPLANT
GOWN STRL REIN XL XLG (GOWN DISPOSABLE) ×4 IMPLANT
KIT BASIN OR (CUSTOM PROCEDURE TRAY) ×2 IMPLANT
MESH HERNIA 3X6 (Mesh General) ×1 IMPLANT
NDL HYPO 25X1 1.5 SAFETY (NEEDLE) ×1 IMPLANT
NEEDLE HYPO 25X1 1.5 SAFETY (NEEDLE) ×2 IMPLANT
NS IRRIG 1000ML POUR BTL (IV SOLUTION) ×2 IMPLANT
PACK BASIC VI WITH GOWN DISP (CUSTOM PROCEDURE TRAY) ×2 IMPLANT
PENCIL BUTTON HOLSTER BLD 10FT (ELECTRODE) ×2 IMPLANT
SPONGE GAUZE 4X4 12PLY (GAUZE/BANDAGES/DRESSINGS) IMPLANT
SPONGE LAP 4X18 X RAY DECT (DISPOSABLE) ×4 IMPLANT
STAPLER VISISTAT 35W (STAPLE) IMPLANT
STRIP CLOSURE SKIN 1/2X4 (GAUZE/BANDAGES/DRESSINGS) ×2 IMPLANT
SUT PROLENE 2 0 CT2 30 (SUTURE) ×6 IMPLANT
SUT SILK 2 0 SH (SUTURE) ×2 IMPLANT
SUT SILK 2 0 SH CR/8 (SUTURE) IMPLANT
SUT SURGILON 0 BLK (SUTURE) IMPLANT
SUT VIC AB 2-0 SH 27 (SUTURE) ×4
SUT VIC AB 2-0 SH 27X BRD (SUTURE) ×2 IMPLANT
SUT VIC AB 4-0 SH 18 (SUTURE) ×2 IMPLANT
SYR BULB IRRIGATION 50ML (SYRINGE) ×2 IMPLANT
SYR CONTROL 10ML LL (SYRINGE) ×2 IMPLANT
YANKAUER SUCT BULB TIP 10FT TU (MISCELLANEOUS) ×2 IMPLANT

## 2011-11-04 NOTE — Op Note (Signed)
Surgeon: Kaylyn Lim, MD, FACS  Asst:  none  Anes:  gen by lma  Procedure: Open right inguinal hernia repair with mesh  Diagnosis: Right direct inguinal hernia  Complications: none  EBL:   minimal cc  Description of Procedure:  The patient was taken to OR 1 at [facility] and given general by LMA.  The right side had been marked and was clipped.  Time performed.  Small oblique incision was made.  External oblique was identified and opened along the fibers into the external ring.  Cord mobilized and a large direct RIH was seen.  The cord was checked and no indirect hernia was seen.    The dome of the direct defect was incised and the preperitoneal fat was swept away.  A small piece of marlex mesh was sewn between the Coopers' ligament and the transversalis fascia.  The overlying transversis was sewn over this.  A piece of marlex type mesh was cut to fit the inguinal area and a tension free repair was done with  Running 2-0 prolene.  The mesh was brought around the cord and an opening in the mesh gave plenty of room for the cord structures.  The mesh laterally was sewn to itself and then tucked beneath the external oblique.  The external oblique was closed with running 2-0 vicryl.  4-0 vicryl was used in the subcut and a subcuticular 5-0 moncryl and Dermabond completed the closure.  The patient was taken to the RR in stable condition.    Matt B. Hassell Done, [place], [facility], [place]

## 2011-11-04 NOTE — Progress Notes (Signed)
Completed bowel prep as directed by doctor

## 2011-11-04 NOTE — Anesthesia Postprocedure Evaluation (Signed)
  Anesthesia Post-op Note  Patient: Anthony [name]  Procedure(s) Performed:  HERNIA REPAIR INGUINAL ADULT - Right Inguinal Hernia Repair with Mesh  Patient Location: PACU  Anesthesia Type: General  Level of Consciousness: awake and alert   Airway and Oxygen Therapy: Patient Spontanous Breathing  Post-op Pain: mild  Post-op Assessment: Post-op Vital signs reviewed, Patient's Cardiovascular Status Stable, Respiratory Function Stable, Patent Airway and No signs of Nausea or vomiting  Post-op Vital Signs: stable  Complications: No apparent anesthesia complications

## 2011-11-04 NOTE — Transfer of Care (Signed)
Immediate Anesthesia Transfer of Care Note  Patient: Anthony [name]  Procedure(s) Performed:  HERNIA REPAIR INGUINAL ADULT - Right Inguinal Hernia Repair with Mesh  Patient Location: PACU  Anesthesia Type: General  Level of Consciousness: awake and alert   Airway & Oxygen Therapy: Patient Spontanous Breathing and Patient connected to face mask oxygen  Post-op Assessment: Report given to PACU RN and Post -op Vital signs reviewed and stable  Post vital signs: Reviewed and stable  Complications: No apparent anesthesia complications

## 2011-11-04 NOTE — H&P (Signed)
Anthony Earls, Anthony [name] 09/25/2011 4:23 PM Signed  Chief Complaint: Right inguinal hernia  History of Present Illness: Anthony [name] is an 77 y.o. male with intermittent right inguinal pain that at times a sharp. Just been actually order the scan that showed this little hernia the now on my exam he is a little more of a bulge that I think is a right indirect inguinal hernia. I discussed repair with mesh with him and his wife and we will get this scheduled since it's now been far enough out from his MI. His primary is Dr. Carlena Sax and his cardiologist as [name].  Past Medical History   Diagnosis  Date   .  Arthritis    .  GERD (gastroesophageal reflux disease)    .  Hyperlipidemia    .  Heart attack  06/2011, 05/2011    Past Surgical History   Procedure  Date   .  Hernia repair  8616     umbilical   .  Wrist surgery  04/25/91     left   .  Knee arthroscopy  1976     right   .  Meniscus debridement  1996     left   .  Trigger finger release  10/10/2009     rt    Medications Prior to Admission   Medication  Sig  Dispense  Refill   .  Ascorbic Acid (VITAMIN C) 1000 MG tablet  Take 1,000 mg by mouth daily.     Marland Kitchen  aspirin 325 MG tablet  Take 325 mg by mouth daily.     .  beclomethasone (BECONASE-AQ) 42 MCG/SPRAY nasal spray  Place 2 sprays into the nose 2 (two) times daily. Dose is for each nostril.     Marland Kitchen  CALCIUM-VITAMIN D PO  Take by mouth daily.     .  carvedilol (COREG) 6.25 MG tablet  BID times 48H.     .  finasteride (PROSCAR) 5 MG tablet  Every other day.     .  folic acid (FOLVITE) 837 MCG tablet  Take 400 mcg by mouth 2 (two) times daily.     .  Glucosamine-Chondroit-Vit C-Mn (GLUCOSAMINE CHONDR 1500 COMPLX PO)  Take by mouth daily.     .  methotrexate (RHEUMATREX) 2.5 MG tablet  8 tablets Once a week.     .  nitroGLYCERIN (NITROSTAT) 0.4 MG SL tablet  Place 0.4 mg under the tongue every 5 (five) minutes as needed.     Marland Kitchen  omeprazole (PRILOSEC) 20 MG capsule  Ad lib.     .   ranitidine (ZANTAC) 300 MG capsule  At bedtime.     .  Tamsulosin HCl (FLOMAX) 0.4 MG CAPS  Every other day.     .  tolterodine (DETROL) 2 MG tablet  Take 2 mg by mouth as needed.     .  traMADol (ULTRAM) 50 MG tablet  Ad lib.     .  Vitamins A & D (VITAMIN A & D) 10000-400 UNITS CAPS  Take by mouth daily.      No current facility-administered medications on file as of 09/25/2011.    No Known Allergies  No family history on file.  Social History: reports that he has quit smoking. He has never used smokeless tobacco. He reports that he does not drink alcohol or use illicit drugs.  REVIEW OF SYSTEMS - PERTINENT POSITIVES ONLY:  Review of systems is positive for glasses, some heart disease,  intermittent prostate problems, arthritis,.  Physical Exam:  Blood pressure 118/66, pulse 64, temperature 97.4 F (36.3 C), temperature source Temporal, resp. rate 16, height 6' (1.829 m), weight 188 lb 3.2 oz (85.367 kg).  Body mass index is 25.52 kg/(m^2).  Gen: No acute distress. Well nourished and well groomed.  Neurological: Alert and oriented to person, place, and time. Coordination normal.  Head: Normocephalic and atraumatic.  Eyes: Conjunctivae are normal. Pupils are equal, round, and reactive to light. No scleral icterus.  Neck: Normal range of motion. Neck supple. No tracheal deviation or thyromegaly present. No bruits  Cardiovascular: Normal rate, regular rhythm, normal heart sounds and intact distal pulses. Exam reveals no gallop and no friction rub. No murmur heard.  Respiratory: Effort normal. No respiratory distress. No chest wall tenderness. Breath sounds normal. No wheezes, rales or rhonchi.  GI: Soft. Bowel sounds are normal. The abdomen is soft and nontender. There is no rebound and no guarding. There is a soft bulge in the right inguinal region consistent with inguinal hernis Musculoskeletal: Normal range of motion. Extremities are nontender.  Lymphadenopathy: No cervical, preauricular,  postauricular or axillary adenopathy is present Skin: Skin is warm and dry. No rash noted. No diaphoresis. No erythema. No pallor. No clubbing, cyanosis, or edema.  Psychiatric: Normal mood and affect. Behavior is normal. Judgment and thought content normal.  LABORATORY RESULTS:  No results found for this or any previous visit (from the past 48 hour(s)).  RADIOLOGY RESULTS:  No results found.  Problem List:  Active Problems:  * No active hospital problems. *   Assessment & Plan:  Symptomatic right inguinal hernia  Anthony [name] Done, Anthony [name], [facility]  [phone] beeper  [phone]  There has been no change in the patient's past medical history or physical exam in the past 24 hours to the best of my knowledge.  Expectations and outcome results have been discussed with the patient to include risks and benefits.  All questions have been answered and will proceed with previously discussed procedure noted and signed in the consent form in the patient's record.    Ramir Malerba BMD @NOW  11/04/2011

## 2011-11-04 NOTE — Anesthesia Preprocedure Evaluation (Addendum)
Anesthesia Evaluation  Patient identified by MRN, date of birth, ID band Patient awake  General Assessment Comment:Rheumatoid arthritis.  Affects neck, but has good ROM neck  Reviewed: Allergy & Precautions, H&P , NPO status , Patient's Chart, lab work & pertinent test results  Airway Mallampati: II TM Distance: >3 FB Neck ROM: Full    Dental No notable dental hx.    Pulmonary neg pulmonary ROS, sleep apnea , pneumonia ,  clear to auscultation  Pulmonary exam normal       Cardiovascular hypertension, Pt. on home beta blockers + CAD, + Past MI, +CHF and neg cardio ROS + dysrhythmias Regular Normal Has had 2 MI. Last in Aug. 2012. No symptoms now.  Had high HR in Aug. Which caused MI. Now controlled with Coreg   Neuro/Psych Negative Neurological ROS  Negative Psych ROS   GI/Hepatic negative GI ROS, Neg liver ROS, GERD-  ,  Endo/Other  Negative Endocrine ROS  Renal/GU negative Renal ROS  Genitourinary negative   Musculoskeletal negative musculoskeletal ROS (+) Arthritis -,   Abdominal   Peds negative pediatric ROS (+)  Hematology negative hematology ROS (+)   Anesthesia Other Findings   Reproductive/Obstetrics negative OB ROS                         Anesthesia Physical Anesthesia Plan  ASA: III  Anesthesia Plan: General   Post-op Pain Management:    Induction: Intravenous  Airway Management Planned: Oral ETT  Additional Equipment:   Intra-op Plan:   Post-operative Plan: Extubation in OR  Informed Consent: I have reviewed the patients History and Physical, chart, labs and discussed the procedure including the risks, benefits and alternatives for the proposed anesthesia with the patient or authorized representative who has indicated his/her understanding and acceptance.   Dental advisory given  Plan Discussed with: CRNA  Anesthesia Plan Comments:         Anesthesia Quick  Evaluation

## 2011-11-05 ENCOUNTER — Encounter (HOSPITAL_COMMUNITY): Payer: Self-pay | Admitting: Surgery

## 2011-11-05 ENCOUNTER — Telehealth (INDEPENDENT_AMBULATORY_CARE_PROVIDER_SITE_OTHER): Payer: Self-pay

## 2011-11-05 NOTE — Telephone Encounter (Signed)
Wife called c/o bruised testicles and penis after hernia surgery yesterday.  Wife was informed that it happens and should resolve in about 2 weeks.  Have patient use ice packs and elevate feet when possible. However we do want him to walk around also. If they would like, he is welcome to come for a nurse only prior to his appointment with Dr. Hassell Done.

## 2011-11-11 ENCOUNTER — Telehealth (INDEPENDENT_AMBULATORY_CARE_PROVIDER_SITE_OTHER): Payer: Self-pay | Admitting: General Surgery

## 2011-11-11 NOTE — Telephone Encounter (Signed)
Robbin spoke with the patients wife regarding pts incision being red and swollen, also states the legnth of the incision is hard. Pt denies fever, and no heat to touch. Per Dr Hassell Done, this is normal and patient is to call if area presents with infection.

## 2011-11-20 ENCOUNTER — Ambulatory Visit (INDEPENDENT_AMBULATORY_CARE_PROVIDER_SITE_OTHER): Payer: Medicare Other | Admitting: Surgery

## 2011-11-20 ENCOUNTER — Encounter (INDEPENDENT_AMBULATORY_CARE_PROVIDER_SITE_OTHER): Payer: Self-pay | Admitting: Surgery

## 2011-11-20 VITALS — BP 112/78 | HR 76 | Temp 97.0°F | Resp 18 | Ht 73.0 in | Wt 188.5 lb

## 2011-11-20 DIAGNOSIS — K4090 Unilateral inguinal hernia, without obstruction or gangrene, not specified as recurrent: Secondary | ICD-10-CM

## 2011-11-20 NOTE — Progress Notes (Signed)
Anthony [name] he is status post open right inguinal hernia repair with mesh. His incision looks good and he is feeling well. He's and gradually increase his activity level and a plan to see him again in 2 months.

## 2011-11-20 NOTE — Patient Instructions (Signed)
Gradually increase level of activity

## 2011-12-18 DIAGNOSIS — R351 Nocturia: Secondary | ICD-10-CM | POA: Diagnosis not present

## 2011-12-18 DIAGNOSIS — R339 Retention of urine, unspecified: Secondary | ICD-10-CM | POA: Diagnosis not present

## 2012-01-14 DIAGNOSIS — M069 Rheumatoid arthritis, unspecified: Secondary | ICD-10-CM | POA: Diagnosis not present

## 2012-01-15 ENCOUNTER — Encounter (INDEPENDENT_AMBULATORY_CARE_PROVIDER_SITE_OTHER): Payer: Self-pay | Admitting: Surgery

## 2012-01-15 ENCOUNTER — Ambulatory Visit (INDEPENDENT_AMBULATORY_CARE_PROVIDER_SITE_OTHER): Payer: Medicare Other | Admitting: Surgery

## 2012-01-15 VITALS — BP 132/78 | HR 76 | Temp 97.0°F | Resp 16 | Ht 73.0 in | Wt 191.6 lb

## 2012-01-15 DIAGNOSIS — Z8719 Personal history of other diseases of the digestive system: Secondary | ICD-10-CM

## 2012-01-15 DIAGNOSIS — Z9889 Other specified postprocedural states: Secondary | ICD-10-CM

## 2012-01-15 NOTE — Progress Notes (Signed)
Anthony [name] 78 y.o.  Body mass index is 25.28 kg/(m^2).  Patient Active Problem List  Diagnoses  . COLONIC POLYPS  . MI  . CORONARY ARTERY DISEASE  . GERD  . ARTHRITIS, RHEUMATOID  . SLEEP APNEA, MILD  . Small fat containing right inguinal hernia that has been painful    No Known Allergies  Past Surgical History  Procedure Date  . Wrist surgery 04/25/91    left  . Knee arthroscopy 1976    right  . Meniscus debridement 1996    left  . Trigger finger release 10/10/2009    rt  . Cardiac catheterization     1996/ 2012 report on chart  . Hernia repair 0016    umbilical  . Colonoscopy   . Inguinal hernia repair 11/04/2011    Procedure: HERNIA REPAIR INGUINAL ADULT;  Surgeon: Pedro Earls, MD;  Location: [facility];  Service: General;  Laterality: Right;  Right Inguinal Hernia Repair with Mesh   Horton Finer, MD, MD No diagnosis found.  Incision healed.  No pain. Doing well.  Matt B. Hassell Done, MD, [facility] [phone] beeper [phone]  01/15/2012 10:05 AM

## 2012-01-15 NOTE — Patient Instructions (Signed)
Followup with [place] as needed

## 2012-02-19 DIAGNOSIS — H2510 Age-related nuclear cataract, unspecified eye: Secondary | ICD-10-CM | POA: Diagnosis not present

## 2012-03-08 DIAGNOSIS — M069 Rheumatoid arthritis, unspecified: Secondary | ICD-10-CM | POA: Diagnosis not present

## 2012-03-08 DIAGNOSIS — Z79899 Other long term (current) drug therapy: Secondary | ICD-10-CM | POA: Diagnosis not present

## 2012-03-10 DIAGNOSIS — I10 Essential (primary) hypertension: Secondary | ICD-10-CM | POA: Diagnosis not present

## 2012-03-10 DIAGNOSIS — I2510 Atherosclerotic heart disease of native coronary artery without angina pectoris: Secondary | ICD-10-CM | POA: Diagnosis not present

## 2012-03-10 DIAGNOSIS — I5022 Chronic systolic (congestive) heart failure: Secondary | ICD-10-CM | POA: Diagnosis not present

## 2012-03-10 DIAGNOSIS — E785 Hyperlipidemia, unspecified: Secondary | ICD-10-CM | POA: Diagnosis not present

## 2012-04-12 DIAGNOSIS — M069 Rheumatoid arthritis, unspecified: Secondary | ICD-10-CM | POA: Diagnosis not present

## 2012-06-06 DIAGNOSIS — R339 Retention of urine, unspecified: Secondary | ICD-10-CM | POA: Diagnosis not present

## 2012-06-07 DIAGNOSIS — M069 Rheumatoid arthritis, unspecified: Secondary | ICD-10-CM | POA: Diagnosis not present

## 2012-06-09 DIAGNOSIS — R350 Frequency of micturition: Secondary | ICD-10-CM | POA: Diagnosis not present

## 2012-06-09 DIAGNOSIS — Z125 Encounter for screening for malignant neoplasm of prostate: Secondary | ICD-10-CM | POA: Diagnosis not present

## 2012-07-30 DIAGNOSIS — Z23 Encounter for immunization: Secondary | ICD-10-CM | POA: Diagnosis not present

## 2012-08-09 DIAGNOSIS — M069 Rheumatoid arthritis, unspecified: Secondary | ICD-10-CM | POA: Diagnosis not present

## 2012-08-09 DIAGNOSIS — Z79899 Other long term (current) drug therapy: Secondary | ICD-10-CM | POA: Diagnosis not present

## 2012-09-12 DIAGNOSIS — E1149 Type 2 diabetes mellitus with other diabetic neurological complication: Secondary | ICD-10-CM | POA: Diagnosis not present

## 2012-09-12 DIAGNOSIS — L97409 Non-pressure chronic ulcer of unspecified heel and midfoot with unspecified severity: Secondary | ICD-10-CM | POA: Diagnosis not present

## 2012-09-13 DIAGNOSIS — M069 Rheumatoid arthritis, unspecified: Secondary | ICD-10-CM | POA: Diagnosis not present

## 2012-09-26 DIAGNOSIS — Z79899 Other long term (current) drug therapy: Secondary | ICD-10-CM | POA: Diagnosis not present

## 2012-09-26 DIAGNOSIS — Z0000 Encounter for general adult medical examination without abnormal findings: Secondary | ICD-10-CM | POA: Diagnosis not present

## 2012-09-26 DIAGNOSIS — Z1331 Encounter for screening for depression: Secondary | ICD-10-CM | POA: Diagnosis not present

## 2012-09-26 DIAGNOSIS — R1031 Right lower quadrant pain: Secondary | ICD-10-CM | POA: Diagnosis not present

## 2012-09-26 DIAGNOSIS — I252 Old myocardial infarction: Secondary | ICD-10-CM | POA: Diagnosis not present

## 2012-09-26 DIAGNOSIS — M069 Rheumatoid arthritis, unspecified: Secondary | ICD-10-CM | POA: Diagnosis not present

## 2012-11-14 DIAGNOSIS — M083 Juvenile rheumatoid polyarthritis (seronegative): Secondary | ICD-10-CM | POA: Diagnosis not present

## 2012-11-21 DIAGNOSIS — M083 Juvenile rheumatoid polyarthritis (seronegative): Secondary | ICD-10-CM | POA: Diagnosis not present

## 2012-11-28 DIAGNOSIS — H2510 Age-related nuclear cataract, unspecified eye: Secondary | ICD-10-CM | POA: Diagnosis not present

## 2012-11-28 DIAGNOSIS — H179 Unspecified corneal scar and opacity: Secondary | ICD-10-CM | POA: Diagnosis not present

## 2012-11-28 DIAGNOSIS — H5200 Hypermetropia, unspecified eye: Secondary | ICD-10-CM | POA: Diagnosis not present

## 2012-11-28 DIAGNOSIS — H52209 Unspecified astigmatism, unspecified eye: Secondary | ICD-10-CM | POA: Diagnosis not present

## 2012-12-06 DIAGNOSIS — M069 Rheumatoid arthritis, unspecified: Secondary | ICD-10-CM | POA: Diagnosis not present

## 2012-12-28 DIAGNOSIS — M25469 Effusion, unspecified knee: Secondary | ICD-10-CM | POA: Diagnosis not present

## 2012-12-28 DIAGNOSIS — M25569 Pain in unspecified knee: Secondary | ICD-10-CM | POA: Diagnosis not present

## 2013-02-20 DIAGNOSIS — M1710 Unilateral primary osteoarthritis, unspecified knee: Secondary | ICD-10-CM | POA: Diagnosis not present

## 2013-02-20 DIAGNOSIS — M25569 Pain in unspecified knee: Secondary | ICD-10-CM | POA: Diagnosis not present

## 2013-02-20 DIAGNOSIS — M25469 Effusion, unspecified knee: Secondary | ICD-10-CM | POA: Diagnosis not present

## 2013-02-24 DIAGNOSIS — M069 Rheumatoid arthritis, unspecified: Secondary | ICD-10-CM | POA: Diagnosis not present

## 2013-02-28 DIAGNOSIS — M069 Rheumatoid arthritis, unspecified: Secondary | ICD-10-CM | POA: Diagnosis not present

## 2013-03-09 DIAGNOSIS — M25539 Pain in unspecified wrist: Secondary | ICD-10-CM | POA: Diagnosis not present

## 2013-03-09 DIAGNOSIS — M25569 Pain in unspecified knee: Secondary | ICD-10-CM | POA: Diagnosis not present

## 2013-03-09 DIAGNOSIS — M25469 Effusion, unspecified knee: Secondary | ICD-10-CM | POA: Diagnosis not present

## 2013-03-30 DIAGNOSIS — M069 Rheumatoid arthritis, unspecified: Secondary | ICD-10-CM | POA: Diagnosis not present

## 2013-04-05 DIAGNOSIS — M2240 Chondromalacia patellae, unspecified knee: Secondary | ICD-10-CM | POA: Diagnosis not present

## 2013-04-07 DIAGNOSIS — I2510 Atherosclerotic heart disease of native coronary artery without angina pectoris: Secondary | ICD-10-CM | POA: Diagnosis not present

## 2013-04-07 DIAGNOSIS — I10 Essential (primary) hypertension: Secondary | ICD-10-CM | POA: Diagnosis not present

## 2013-04-07 DIAGNOSIS — Z01810 Encounter for preprocedural cardiovascular examination: Secondary | ICD-10-CM | POA: Diagnosis not present

## 2013-04-07 DIAGNOSIS — F29 Unspecified psychosis not due to a substance or known physiological condition: Secondary | ICD-10-CM | POA: Diagnosis not present

## 2013-04-07 DIAGNOSIS — I252 Old myocardial infarction: Secondary | ICD-10-CM | POA: Diagnosis not present

## 2013-04-11 DIAGNOSIS — E538 Deficiency of other specified B group vitamins: Secondary | ICD-10-CM | POA: Diagnosis not present

## 2013-04-11 DIAGNOSIS — R413 Other amnesia: Secondary | ICD-10-CM | POA: Diagnosis not present

## 2013-05-31 DIAGNOSIS — M069 Rheumatoid arthritis, unspecified: Secondary | ICD-10-CM | POA: Diagnosis not present

## 2013-06-15 DIAGNOSIS — R413 Other amnesia: Secondary | ICD-10-CM | POA: Diagnosis not present

## 2013-07-03 DIAGNOSIS — M069 Rheumatoid arthritis, unspecified: Secondary | ICD-10-CM | POA: Diagnosis not present

## 2013-07-31 DIAGNOSIS — R197 Diarrhea, unspecified: Secondary | ICD-10-CM | POA: Diagnosis not present

## 2013-08-15 DIAGNOSIS — R197 Diarrhea, unspecified: Secondary | ICD-10-CM | POA: Diagnosis not present

## 2013-08-28 DIAGNOSIS — M069 Rheumatoid arthritis, unspecified: Secondary | ICD-10-CM | POA: Diagnosis not present

## 2013-09-13 DIAGNOSIS — Z23 Encounter for immunization: Secondary | ICD-10-CM | POA: Diagnosis not present

## 2013-09-27 ENCOUNTER — Encounter: Payer: Self-pay | Admitting: Interventional Cardiology

## 2013-09-27 DIAGNOSIS — I5022 Chronic systolic (congestive) heart failure: Secondary | ICD-10-CM | POA: Diagnosis not present

## 2013-09-27 DIAGNOSIS — Z1331 Encounter for screening for depression: Secondary | ICD-10-CM | POA: Diagnosis not present

## 2013-09-27 DIAGNOSIS — I252 Old myocardial infarction: Secondary | ICD-10-CM | POA: Diagnosis not present

## 2013-09-27 DIAGNOSIS — E7800 Pure hypercholesterolemia, unspecified: Secondary | ICD-10-CM | POA: Diagnosis not present

## 2013-09-27 DIAGNOSIS — I700 Atherosclerosis of aorta: Secondary | ICD-10-CM | POA: Diagnosis not present

## 2013-09-27 DIAGNOSIS — Z0000 Encounter for general adult medical examination without abnormal findings: Secondary | ICD-10-CM | POA: Diagnosis not present

## 2013-09-27 DIAGNOSIS — I2510 Atherosclerotic heart disease of native coronary artery without angina pectoris: Secondary | ICD-10-CM | POA: Diagnosis not present

## 2013-10-09 ENCOUNTER — Telehealth: Payer: Self-pay

## 2013-10-09 NOTE — Telephone Encounter (Signed)
pt wife given lipid lab results.At [place]. Have PCP repeat in 1 year.pt wife verbalized understanding.

## 2013-10-09 NOTE — Telephone Encounter (Signed)
Message copied by Lamar Laundry on Mon Oct 09, 2013 11:52 AM ------      Message from: Daneen Schick      Created: Mon Oct 09, 2013  8:50 AM       At target. Have PCP repeat in 1 year ------

## 2013-11-22 DIAGNOSIS — M069 Rheumatoid arthritis, unspecified: Secondary | ICD-10-CM | POA: Diagnosis not present

## 2014-02-01 DIAGNOSIS — M069 Rheumatoid arthritis, unspecified: Secondary | ICD-10-CM | POA: Diagnosis not present

## 2014-02-28 DIAGNOSIS — M069 Rheumatoid arthritis, unspecified: Secondary | ICD-10-CM | POA: Diagnosis not present

## 2014-02-28 DIAGNOSIS — R197 Diarrhea, unspecified: Secondary | ICD-10-CM | POA: Diagnosis not present

## 2014-03-08 ENCOUNTER — Encounter: Payer: Self-pay | Admitting: Interventional Cardiology

## 2014-03-27 DIAGNOSIS — K921 Melena: Secondary | ICD-10-CM | POA: Diagnosis not present

## 2014-03-27 DIAGNOSIS — R197 Diarrhea, unspecified: Secondary | ICD-10-CM | POA: Diagnosis not present

## 2014-04-05 DIAGNOSIS — K6389 Other specified diseases of intestine: Secondary | ICD-10-CM | POA: Diagnosis not present

## 2014-04-05 DIAGNOSIS — K626 Ulcer of anus and rectum: Secondary | ICD-10-CM | POA: Diagnosis not present

## 2014-04-05 DIAGNOSIS — D126 Benign neoplasm of colon, unspecified: Secondary | ICD-10-CM | POA: Diagnosis not present

## 2014-04-05 DIAGNOSIS — K5289 Other specified noninfective gastroenteritis and colitis: Secondary | ICD-10-CM | POA: Diagnosis not present

## 2014-04-05 DIAGNOSIS — K922 Gastrointestinal hemorrhage, unspecified: Secondary | ICD-10-CM | POA: Diagnosis not present

## 2014-04-05 DIAGNOSIS — K633 Ulcer of intestine: Secondary | ICD-10-CM | POA: Diagnosis not present

## 2014-04-05 DIAGNOSIS — K6289 Other specified diseases of anus and rectum: Secondary | ICD-10-CM | POA: Diagnosis not present

## 2014-04-05 DIAGNOSIS — K921 Melena: Secondary | ICD-10-CM | POA: Diagnosis not present

## 2014-04-05 DIAGNOSIS — K5190 Ulcerative colitis, unspecified, without complications: Secondary | ICD-10-CM | POA: Diagnosis not present

## 2014-04-06 DIAGNOSIS — K626 Ulcer of anus and rectum: Secondary | ICD-10-CM | POA: Diagnosis not present

## 2014-04-06 DIAGNOSIS — K5289 Other specified noninfective gastroenteritis and colitis: Secondary | ICD-10-CM | POA: Diagnosis not present

## 2014-04-06 DIAGNOSIS — K633 Ulcer of intestine: Secondary | ICD-10-CM | POA: Diagnosis not present

## 2014-04-06 DIAGNOSIS — D126 Benign neoplasm of colon, unspecified: Secondary | ICD-10-CM | POA: Diagnosis not present

## 2014-04-11 ENCOUNTER — Ambulatory Visit: Payer: Medicare Other | Admitting: Interventional Cardiology

## 2014-05-08 DIAGNOSIS — M069 Rheumatoid arthritis, unspecified: Secondary | ICD-10-CM | POA: Diagnosis not present

## 2014-05-30 DIAGNOSIS — K5010 Crohn's disease of large intestine without complications: Secondary | ICD-10-CM | POA: Diagnosis not present

## 2014-05-31 ENCOUNTER — Ambulatory Visit (INDEPENDENT_AMBULATORY_CARE_PROVIDER_SITE_OTHER): Payer: Medicare Other | Admitting: Interventional Cardiology

## 2014-05-31 ENCOUNTER — Encounter: Payer: Self-pay | Admitting: Interventional Cardiology

## 2014-05-31 VITALS — BP 124/56 | HR 91 | Ht 73.0 in | Wt 173.0 lb

## 2014-05-31 DIAGNOSIS — I10 Essential (primary) hypertension: Secondary | ICD-10-CM | POA: Diagnosis not present

## 2014-05-31 DIAGNOSIS — I5042 Chronic combined systolic (congestive) and diastolic (congestive) heart failure: Secondary | ICD-10-CM | POA: Diagnosis not present

## 2014-05-31 DIAGNOSIS — I2510 Atherosclerotic heart disease of native coronary artery without angina pectoris: Secondary | ICD-10-CM

## 2014-05-31 DIAGNOSIS — E785 Hyperlipidemia, unspecified: Secondary | ICD-10-CM

## 2014-05-31 NOTE — Progress Notes (Signed)
Patient ID: ELJAY LAVE, male   DOB: Oct 27, 1934, 80 y.o.   MRN: [id number]    [address], [address] [place], [place]  [address] Phone: [phone] Fax:  [phone]  Date:  05/31/2014   ID:  [name], DOB 05/02/1934, MRN [id number]  PCP:  Horton Finer, MD   ASSESSMENT:  1. Coronary atherosclerosis with moderate disease documented by angiography in 2012. Prior anterolateral infarction due to occlusion of a large obtuse marginal/ramus branch . The patient is stable and asymptomatic 2. Hyperlipidemia on therapy  3. Hypertension was controlled pressure   PLAN:  1. Clinical followup in one year 2. No change in current medical regimen 3. Maintain an active lifestyle as much as possible   SUBJECTIVE: EFREM PITSTICK is a 80 y.o. male who is doing well. He is sad that he is moving to an independent living facility with his wife. They're selling their property and moving to take some of her physical and mental stress of managing day today house chores. He denies orthopnea, dyspnea, chest pain, palpitations, and syncope. His his annual followup in he has no specific concerns or complaints.   Wt Readings from Last 3 Encounters:  05/31/14 173 lb (78.472 kg)  01/15/12 191 lb 9.6 oz (86.909 kg)  11/20/11 188 lb 8 oz (85.503 kg)     Past Medical History  Diagnosis Date  . GERD (gastroesophageal reflux disease)   . Hyperlipidemia   . Heart attack 1996, 06/2011  . Coronary artery disease   . Hypertension   . Pneumonia 1993  . Sleep apnea     sleep study years ago- has apnea but did not qualify for CPAP  . Prostate troubles     states take alternating meds for bladder/prostate control- "age thing"  . Arthritis     rheumatoid-  Dr Barkley Boards  . CHF (congestive heart failure)     chronic systolic heart failure per Dr [name] [facility] note 09/23/11.   EKG 4/12, stress test  and cath from 8/12 on chart.  Clearance with LOV Dr [name] on chart  . Dysrhythmia    nonsustained,asymptomatic VT per Dr [name] office note  resulting in cath    Current Outpatient Prescriptions  Medication Sig Dispense Refill  . Ascorbic Acid (VITAMIN C) 1000 MG tablet Take 1,000 mg by mouth daily.       Marland Kitchen aspirin 325 MG tablet Take 325 mg by mouth every morning.       . beclomethasone (BECONASE-AQ) 42 MCG/SPRAY nasal spray Place 2 sprays into the nose 2 (two) times daily as needed. Allergies       . CALCIUM-VITAMIN D PO Take 1,000 mg by mouth daily.       . carvedilol (COREG) 6.25 MG tablet Take 6.25 mg by mouth 2 (two) times daily with a meal.       . finasteride (PROSCAR) 5 MG tablet Take 5 mg by mouth Every other day.       . fish oil-omega-3 fatty acids 1000 MG capsule Take 1 g by mouth daily.       . folic acid (FOLVITE) 497 MCG tablet Take 400 mcg by mouth 2 (two) times daily.       . Glucosamine-Chondroit-Vit C-Mn (GLUCOSAMINE CHONDR 1500 COMPLX PO) Take 1 tablet by mouth daily.       Marland Kitchen HYDROcodone-acetaminophen (VICODIN) 5-500 MG per tablet Take 1 tablet by mouth every 6 (six) hours as needed.        Marland Kitchen  methotrexate (RHEUMATREX) 2.5 MG tablet Take 8 tablets by mouth Once a week. Sunday       . nitroGLYCERIN (NITROSTAT) 0.4 MG SL tablet Place 0.4 mg under the tongue every 5 (five) minutes as needed. Chest pain       . omeprazole (PRILOSEC) 20 MG capsule Take 20 mg by mouth daily.       . pravastatin (PRAVACHOL) 40 MG tablet Take 40 mg by mouth at bedtime.       . ranitidine (ZANTAC) 300 MG capsule Take 300 mg by mouth At bedtime.       . Tamsulosin HCl (FLOMAX) 0.4 MG CAPS Take 0.4 mg by mouth Every other day.       . tolterodine (DETROL) 2 MG tablet Take 2 mg by mouth as needed. Bladder       . traMADol (ULTRAM) 50 MG tablet Take 50 mg by mouth at bedtime.       . Vitamins A & D (VITAMIN A & D) 10000-400 UNITS CAPS Take 1 tablet by mouth daily.        No current facility-administered medications for this visit.    Allergies:   No Known Allergies  Social  History:  The patient  reports that he quit smoking about 43 years ago. He has never used smokeless tobacco. He reports that he does not drink alcohol or use illicit drugs.   ROS:  Please see the history of present illness.   Appetite is good although he has lost significant weight. No explanation is been found.    no blood in the urine or stool. No transient neurological complaints. He has had intermittent episodes of significant confusion without explanation being found. All other systems reviewed and negative.   OBJECTIVE: VS:  BP 124/56  Pulse 91  Ht 6' 1"  (1.854 m)  Wt 173 lb (78.472 kg)  BMI 22.83 kg/m2 Well nourished, well developed, in no acute distress, appears chronically ill  HEENT: normal Neck: JVD flat. Carotid bruit absent  Cardiac:  normal S1, S2; RRR; no murmur Lungs:  clear to auscultation bilaterally, no wheezing, rhonchi or rales Abd: soft, nontender, no hepatomegaly Ext: Edema absent. Pulses 2+  Skin: warm and dry Neuro:  CNs 2-12 intact, no focal abnormalities noted  EKG:  Normal sinus rhythm with poor R-wave progression V1 through V4 unchanged from prior tracings       Signed, Illene Labrador III, MD 05/31/2014 4:50 PM

## 2014-05-31 NOTE — Patient Instructions (Signed)
Your physician recommends that you continue on your current medications as directed. Please refer to the Current Medication list given to you today.  Your physician wants you to follow-up in: 1 year. You will receive a reminder letter in the mail two months in advance. If you don't receive a letter, please call our office to schedule the follow-up appointment.  

## 2014-06-25 ENCOUNTER — Other Ambulatory Visit: Payer: Self-pay | Admitting: *Deleted

## 2014-06-25 MED ORDER — PRAVASTATIN SODIUM 40 MG PO TABS: 40.0000 mg | ORAL_TABLET | Freq: Every day | ORAL | Status: DC

## 2014-06-28 DIAGNOSIS — R059 Cough, unspecified: Secondary | ICD-10-CM | POA: Diagnosis not present

## 2014-06-28 DIAGNOSIS — R05 Cough: Secondary | ICD-10-CM | POA: Diagnosis not present

## 2014-07-02 ENCOUNTER — Other Ambulatory Visit: Payer: Self-pay | Admitting: Internal Medicine

## 2014-07-02 DIAGNOSIS — R059 Cough, unspecified: Secondary | ICD-10-CM | POA: Diagnosis not present

## 2014-07-02 DIAGNOSIS — R599 Enlarged lymph nodes, unspecified: Secondary | ICD-10-CM

## 2014-07-02 DIAGNOSIS — R05 Cough: Secondary | ICD-10-CM | POA: Diagnosis not present

## 2014-07-02 DIAGNOSIS — R634 Abnormal weight loss: Secondary | ICD-10-CM | POA: Diagnosis not present

## 2014-07-06 ENCOUNTER — Ambulatory Visit
Admission: RE | Admit: 2014-07-06 | Discharge: 2014-07-06 | Disposition: A | Discharge status: Home / Self Care | Payer: Medicare Other | Source: Ambulatory Visit | Attending: Internal Medicine | Admitting: Internal Medicine

## 2014-07-06 DIAGNOSIS — R059 Cough, unspecified: Secondary | ICD-10-CM

## 2014-07-06 DIAGNOSIS — R05 Cough: Secondary | ICD-10-CM

## 2014-07-06 DIAGNOSIS — J438 Other emphysema: Secondary | ICD-10-CM | POA: Diagnosis not present

## 2014-07-06 DIAGNOSIS — R634 Abnormal weight loss: Secondary | ICD-10-CM

## 2014-07-06 DIAGNOSIS — R599 Enlarged lymph nodes, unspecified: Secondary | ICD-10-CM

## 2014-07-06 MED ORDER — IOHEXOL 300 MG/ML  SOLN
75.0000 mL | Freq: Once | INTRAMUSCULAR | Status: AC | PRN
  Administered 2014-07-06: 75 mL via INTRAVENOUS

## 2014-07-09 DIAGNOSIS — D649 Anemia, unspecified: Secondary | ICD-10-CM | POA: Diagnosis not present

## 2014-07-09 DIAGNOSIS — R05 Cough: Secondary | ICD-10-CM | POA: Diagnosis not present

## 2014-07-09 DIAGNOSIS — I10 Essential (primary) hypertension: Secondary | ICD-10-CM | POA: Diagnosis not present

## 2014-07-09 DIAGNOSIS — R599 Enlarged lymph nodes, unspecified: Secondary | ICD-10-CM | POA: Diagnosis not present

## 2014-07-09 DIAGNOSIS — R634 Abnormal weight loss: Secondary | ICD-10-CM | POA: Diagnosis not present

## 2014-07-09 DIAGNOSIS — R059 Cough, unspecified: Secondary | ICD-10-CM | POA: Diagnosis not present

## 2014-07-09 DIAGNOSIS — I2510 Atherosclerotic heart disease of native coronary artery without angina pectoris: Secondary | ICD-10-CM | POA: Diagnosis not present

## 2014-07-09 DIAGNOSIS — E871 Hypo-osmolality and hyponatremia: Secondary | ICD-10-CM | POA: Diagnosis not present

## 2014-07-16 ENCOUNTER — Ambulatory Visit (INDEPENDENT_AMBULATORY_CARE_PROVIDER_SITE_OTHER): Payer: Medicare Other | Admitting: Pulmonary Disease

## 2014-07-16 ENCOUNTER — Encounter: Payer: Self-pay | Admitting: Pulmonary Disease

## 2014-07-16 ENCOUNTER — Other Ambulatory Visit (INDEPENDENT_AMBULATORY_CARE_PROVIDER_SITE_OTHER): Payer: Medicare Other

## 2014-07-16 VITALS — BP 126/78 | HR 86 | Temp 98.1°F | Ht 73.0 in | Wt 170.2 lb

## 2014-07-16 DIAGNOSIS — R918 Other nonspecific abnormal finding of lung field: Secondary | ICD-10-CM | POA: Diagnosis not present

## 2014-07-16 LAB — RHEUMATOID FACTOR: RHEUMATOID FACTOR: 326 [IU]/mL — AB (ref <=14)

## 2014-07-16 LAB — SEDIMENTATION RATE: Sed Rate: 68 mm/hr — ABNORMAL HIGH (ref 0–22)

## 2014-07-16 MED ORDER — PREDNISONE 20 MG PO TABS: ORAL_TABLET | ORAL | Status: DC

## 2014-07-16 NOTE — Patient Instructions (Signed)
Will start on a course of prednisone to see if you abnormalities on your chest scan resolve.  Please take as directed. Will check bloodwork today for inflammatory issues that may be contributing to your lung issue.  Will see you back in 4 weeks.

## 2014-07-16 NOTE — Progress Notes (Signed)
   Subjective:    Patient ID: Anthony [name], male    DOB: 1934-07-01, 80 y.o.   MRN: [id number]  HPI The patient is a 80 year old male who I've been asked to see for an abnormal CT chest. He was in his usual state of health until proximally 6 weeks ago, when he awakens with a dry cough. This worsened over a period of time, but clearly got better with cough suppression. However, he has also been experiencing malaise as well as a 10-15 pound weight loss over the 6 weeks.  He has had decreased appetite as well, but really hasn't seen a significant impact on his breathing. He had a recent chest CT that showed bilateral groundglass opacities that were more upper lobe than lower lobe, and more posterior than anterior. There was no associated lymphadenopathy. The patient does have a history of smoking 3 packs per day for 20 years, but has not smoked since 1971. He does have a history of rheumatoid arthritis for which he is on methotrexate for the last 15 years. The patient also has a known cardiomyopathy with some degree of chronic congestive heart failure. He never really coughs up mucus of any kind, and denies any issues with aspiration. He has no history of TB exposure, and hasn't had a negative PPD in the past. 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 He denies any significant exposure to asbestos. He has no unusual hobbies that involve particulate matter, and no unusual pets. He has no mold issues in his house, and does not work in farming.   Review of Systems  Constitutional: Positive for appetite change and unexpected weight change. Negative for fever.  HENT: Negative for congestion, dental problem, ear pain, nosebleeds, postnasal drip, rhinorrhea, sinus pressure, sneezing, sore throat and trouble swallowing.   Eyes: Negative for redness and itching.  Respiratory: Positive for cough, chest tightness and wheezing. Negative for shortness of breath.   Cardiovascular:  Positive for chest pain. Negative for palpitations and leg swelling.  Gastrointestinal: Negative for nausea and vomiting.  Genitourinary: Negative for dysuria.  Musculoskeletal: Negative for joint swelling.  Skin: Negative for rash.  Neurological: Negative for headaches.  Hematological: Does not bruise/bleed easily.  Psychiatric/Behavioral: Negative for dysphoric mood. The patient is not nervous/anxious.        Objective:   Physical Exam Constitutional:  Frail appearing male, no acute distress  HENT:  Nares patent without discharge  Oropharynx without exudate, palate and uvula are normal  Eyes:  Perrla, eomi, no scleral icterus  Neck:  No JVD, no TMG  Cardiovascular:  Normal rate, regular rhythm, no rubs or gallops.  No murmurs        Intact distal pulses  Pulmonary :  Normal breath sounds, no stridor or respiratory distress   No rales, rhonchi, or wheezing  Abdominal:  Soft, nondistended, bowel sounds present.  No tenderness noted.   Musculoskeletal:  No lower extremity edema noted.  Lymph Nodes:  No cervical lymphadenopathy noted  Skin:  No cyanosis noted  Neurologic:  Alert, appropriate, moves all 4 extremities without obvious deficit.         Assessment & Plan:

## 2014-07-16 NOTE — Assessment & Plan Note (Signed)
The patient has a cough of 6 weeks duration, and a recent CT chest with bilateral groundglass opacities that do not necessarily fit a specific pattern. He really has no significant occupational exposures, no unusual pets or hobbies, and no definite mold exposure. He does have a history of a cardiomyopathy with chronic congestive heart failure, but the pattern does not fit with edema. However, he does have some underlying emphysema which may make the presentation of edema atypical.  He also has a history of rheumatoid arthritis, and could possibly have BOOP? He is also on methotrexate which can cause pulmonary infiltrates, but it is typically associated with pleural effusion and pleuritis. With the upper lobes being involved more than the lower lobes, we also need to consider the possibility of hypersensitivity pneumonitis. It had a long discussion with him about empiric treatment with steroids, versus more aggressive treatment with bronchoscopy or surgical biopsy. I really do not think this is infectious or malignant, and therefore a trial of prednisone would not be unreasonable. Would also check some blood work for various causes of inflammatory lung disease. If the patient fails to improve on a 4 week course of steroids, could then consider more aggressive workup with bronchoscopy. The patient and his wife are agreeable to this approach.

## 2014-07-17 LAB — ANGIOTENSIN CONVERTING ENZYME: Angiotensin-Converting Enzyme: 56 U/L — ABNORMAL HIGH (ref 8–52)

## 2014-07-21 LAB — HYPERSENSITIVITY PNUEMONITIS PROFILE

## 2014-07-24 DIAGNOSIS — K5010 Crohn's disease of large intestine without complications: Secondary | ICD-10-CM | POA: Diagnosis not present

## 2014-08-09 DIAGNOSIS — M069 Rheumatoid arthritis, unspecified: Secondary | ICD-10-CM | POA: Diagnosis not present

## 2014-08-10 ENCOUNTER — Other Ambulatory Visit: Payer: Self-pay | Admitting: Internal Medicine

## 2014-08-10 ENCOUNTER — Ambulatory Visit
Admission: RE | Admit: 2014-08-10 | Discharge: 2014-08-10 | Disposition: A | Discharge status: Home / Self Care | Payer: Medicare Other | Source: Ambulatory Visit | Attending: Internal Medicine | Admitting: Internal Medicine

## 2014-08-10 DIAGNOSIS — R918 Other nonspecific abnormal finding of lung field: Secondary | ICD-10-CM | POA: Diagnosis not present

## 2014-08-10 DIAGNOSIS — R9389 Abnormal findings on diagnostic imaging of other specified body structures: Secondary | ICD-10-CM

## 2014-08-14 DIAGNOSIS — R599 Enlarged lymph nodes, unspecified: Secondary | ICD-10-CM | POA: Diagnosis not present

## 2014-08-14 DIAGNOSIS — R05 Cough: Secondary | ICD-10-CM | POA: Diagnosis not present

## 2014-08-14 DIAGNOSIS — R9389 Abnormal findings on diagnostic imaging of other specified body structures: Secondary | ICD-10-CM | POA: Diagnosis not present

## 2014-08-14 DIAGNOSIS — R059 Cough, unspecified: Secondary | ICD-10-CM | POA: Diagnosis not present

## 2014-08-14 DIAGNOSIS — Z23 Encounter for immunization: Secondary | ICD-10-CM | POA: Diagnosis not present

## 2014-08-27 ENCOUNTER — Ambulatory Visit: Payer: Medicare Other | Admitting: Pulmonary Disease

## 2014-09-20 ENCOUNTER — Ambulatory Visit (INDEPENDENT_AMBULATORY_CARE_PROVIDER_SITE_OTHER): Payer: Medicare Other | Admitting: Interventional Cardiology

## 2014-09-20 ENCOUNTER — Encounter: Payer: Self-pay | Admitting: Interventional Cardiology

## 2014-09-20 VITALS — BP 112/60 | HR 92 | Ht 73.0 in | Wt 189.8 lb

## 2014-09-20 DIAGNOSIS — I5042 Chronic combined systolic (congestive) and diastolic (congestive) heart failure: Secondary | ICD-10-CM | POA: Diagnosis not present

## 2014-09-20 DIAGNOSIS — I10 Essential (primary) hypertension: Secondary | ICD-10-CM | POA: Diagnosis not present

## 2014-09-20 DIAGNOSIS — I2510 Atherosclerotic heart disease of native coronary artery without angina pectoris: Secondary | ICD-10-CM

## 2014-09-20 NOTE — Patient Instructions (Signed)
Your physician recommends that you continue on your current medications as directed. Please refer to the Current Medication list given to you today.  Your physician has requested that you have an echocardiogram. Echocardiography is a painless test that uses sound waves to create images of your heart. It provides your doctor with information about the size and shape of your heart and how well your heart's chambers and valves are working. This procedure takes approximately one hour. There are no restrictions for this procedure.   Your physician recommends that you schedule a follow-up appointment as needed

## 2014-09-20 NOTE — Progress Notes (Signed)
Patient ID: Anthony [name], male   DOB: 10/13/1934, 80 y.o.   MRN: [id number]    [address] [place], [place]  [address] Phone: [phone] Fax:  [phone]  Date:  09/20/2014   ID:  Anthony [name], DOB 1934-09-22, MRN [id number]  PCP:  Anthony Screws, MD   ASSESSMENT:  1. Chronic combined systolic and diastolic heart failure 2. NEW: Fatigue is the reason that the wife has brought the patient in for further evaluation 3. Coronary artery disease without angina 4. Hypertension, controlled 5. Dementia  PLAN:  1. 2-D Doppler echocardiogram to assess systolic function. If no dramatic change, no further cardiac evaluation will be indicated with reference to problem #2 above. 2. The fatigue that the patient is suffering is likely multifactorial and may have a central nervous system etiology 3. Clinical follow-up as needed if develops cardiac symptoms.    SUBJECTIVE: Anthony [name] is a 80 y.o. male was last seen in July. He was stable from cardiac standpoint at that time. He is back earlier today with a note from his primary care physician reason the question of whether T could be related to coronary disease or heart issue. The patient has no cardiopulmonary complaints such as angina, orthopnea, PND, edema, or palpitations. He has not had syncope. He does sleep in a chair but cannot tell me why. He states he feels better when he sleeps in a chair with relationship to his back, legs, and overall comparable.  Wt Readings from Last 3 Encounters:  09/20/14 189 lb 12.8 oz (86.093 kg)  07/16/14 170 lb 3.2 oz (77.202 kg)  05/31/14 173 lb (78.472 kg)     Past Medical History  Diagnosis Date  . GERD (gastroesophageal reflux disease)   . Hyperlipidemia   . Heart attack 1996, 06/2011  . Coronary artery disease   . Hypertension   . Pneumonia 1993  . Sleep apnea     sleep study years ago- has apnea but did not qualify for CPAP  . Prostate troubles     states take  alternating meds for bladder/prostate control- "age thing"  . Arthritis     rheumatoid-  Dr Barkley Boards  . CHF (congestive heart failure)     chronic systolic heart failure per Dr [name] [facility] note 09/23/11.   EKG 4/12, stress test  and cath from 8/12 on chart.  Clearance with LOV Dr [name] on chart  . Dysrhythmia     nonsustained,asymptomatic VT per Dr [name] office note  resulting in cath    Current Outpatient Prescriptions  Medication Sig Dispense Refill  . Ascorbic Acid (VITAMIN C) 1000 MG tablet Take 1,000 mg by mouth daily.       Marland Kitchen aspirin 325 MG tablet Take 325 mg by mouth every morning.       . beclomethasone (BECONASE-AQ) 42 MCG/SPRAY nasal spray Place 2 sprays into the nose 2 (two) times daily as needed. Allergies       . CALCIUM-VITAMIN D PO Take 1,000 mg by mouth daily.       . carvedilol (COREG) 6.25 MG tablet Take 6.25 mg by mouth 2 (two) times daily with a meal.       . donepezil (ARICEPT) 10 MG tablet Take 10 mg by mouth at bedtime.      . fish oil-omega-3 fatty acids 1000 MG capsule Take 1 g by mouth daily.       . folic acid (FOLVITE) 101 MCG tablet Take 400 mcg by  mouth 2 (two) times daily.       . methotrexate (RHEUMATREX) 2.5 MG tablet Take 8 tablets by mouth Once a week. Sunday       . nitroGLYCERIN (NITROSTAT) 0.4 MG SL tablet Place 0.4 mg under the tongue every 5 (five) minutes as needed. Chest pain       . omeprazole (PRILOSEC) 20 MG capsule Take 20 mg by mouth daily.       . pravastatin (PRAVACHOL) 40 MG tablet Take 1 tablet (40 mg total) by mouth at bedtime.  90 tablet  1  . PRESCRIPTION MEDICATION Liada---Crohns      . ranitidine (ZANTAC) 300 MG capsule Take 300 mg by mouth At bedtime.       . Vitamins A & D (VITAMIN A & D) 10000-400 UNITS CAPS Take 1 tablet by mouth daily.        No current facility-administered medications for this visit.    Allergies:   No Known Allergies  Social History:  The patient  reports that he quit smoking about 43 years ago. His  smoking use included Cigarettes. He has a 60 pack-year smoking history. He has never used smokeless tobacco. He reports that he does not drink alcohol or use illicit drugs.   ROS:  Please see the history of present illness.   He has had confusion and periods of loss of memory over the last 5 years. This is progressing. His wife admits to me that he is developing dementia. I've seen this progressed over the past 5-7 years. The patient is much less engaging in conversation.   All other systems reviewed and negative.   OBJECTIVE: VS:  BP 112/60  Pulse 92  Ht 6' 1"  (1.854 m)  Wt 189 lb 12.8 oz (86.093 kg)  BMI 25.05 kg/m2  SpO2 97% Well nourished, well developed, in no acute distress, elderly  HEENT: normal Neck: JVD flat. Carotid bruit absent  Cardiac:  normal S1, S2; RRR; no murmur Lungs:  clear to auscultation bilaterally, no wheezing, rhonchi or rales Abd: soft, nontender, no hepatomegaly Ext: Edema absent. Pulses 2+  Skin: warm and dry Neuro:  CNs 2-12 intact, no focal abnormalities noted  EKG:  Not performed       Signed, Illene Labrador III, MD 09/20/2014 12:45 PM

## 2014-09-25 ENCOUNTER — Ambulatory Visit (HOSPITAL_COMMUNITY): Payer: Medicare Other | Attending: Cardiology | Admitting: Cardiology

## 2014-09-25 DIAGNOSIS — I5042 Chronic combined systolic (congestive) and diastolic (congestive) heart failure: Secondary | ICD-10-CM

## 2014-09-25 DIAGNOSIS — E785 Hyperlipidemia, unspecified: Secondary | ICD-10-CM | POA: Insufficient documentation

## 2014-09-25 DIAGNOSIS — I2510 Atherosclerotic heart disease of native coronary artery without angina pectoris: Secondary | ICD-10-CM | POA: Diagnosis not present

## 2014-09-25 DIAGNOSIS — I10 Essential (primary) hypertension: Secondary | ICD-10-CM | POA: Diagnosis not present

## 2014-09-25 NOTE — Progress Notes (Signed)
Echo performed. 

## 2014-09-28 ENCOUNTER — Telehealth: Payer: Self-pay

## 2014-09-28 NOTE — Telephone Encounter (Signed)
-----   Message from [place], MD sent at 09/26/2014  1:38 PM EST ----- Heart is stable with mild chronic reduction in systolic function unchanged over past 10 years

## 2014-09-28 NOTE — Telephone Encounter (Signed)
Pt aware of echo results.Heart is stable with mild chronic reduction in systolic function unchanged over past 10 years.pt verbalized understanding.

## 2014-09-28 NOTE — Telephone Encounter (Signed)
-----   Message from [place], MD sent at 09/26/2014  1:38 PM EST ----- Heart is stable with mild chronic reduction in systolic function unchanged over past 10 years

## 2014-10-01 DIAGNOSIS — I10 Essential (primary) hypertension: Secondary | ICD-10-CM | POA: Diagnosis not present

## 2014-10-01 DIAGNOSIS — Z79899 Other long term (current) drug therapy: Secondary | ICD-10-CM | POA: Diagnosis not present

## 2014-10-01 DIAGNOSIS — E559 Vitamin D deficiency, unspecified: Secondary | ICD-10-CM | POA: Diagnosis not present

## 2014-10-01 DIAGNOSIS — Z23 Encounter for immunization: Secondary | ICD-10-CM | POA: Diagnosis not present

## 2014-10-01 DIAGNOSIS — Z1389 Encounter for screening for other disorder: Secondary | ICD-10-CM | POA: Diagnosis not present

## 2014-10-01 DIAGNOSIS — E785 Hyperlipidemia, unspecified: Secondary | ICD-10-CM | POA: Diagnosis not present

## 2014-10-01 DIAGNOSIS — M069 Rheumatoid arthritis, unspecified: Secondary | ICD-10-CM | POA: Diagnosis not present

## 2014-10-01 DIAGNOSIS — Z0000 Encounter for general adult medical examination without abnormal findings: Secondary | ICD-10-CM | POA: Diagnosis not present

## 2014-10-01 DIAGNOSIS — J8489 Other specified interstitial pulmonary diseases: Secondary | ICD-10-CM | POA: Diagnosis not present

## 2014-11-04 DIAGNOSIS — R531 Weakness: Secondary | ICD-10-CM | POA: Diagnosis not present

## 2014-11-04 DIAGNOSIS — R404 Transient alteration of awareness: Secondary | ICD-10-CM | POA: Diagnosis not present

## 2014-11-05 ENCOUNTER — Emergency Department (HOSPITAL_COMMUNITY): Payer: Medicare Other

## 2014-11-05 ENCOUNTER — Encounter (HOSPITAL_COMMUNITY): Payer: Self-pay

## 2014-11-05 ENCOUNTER — Inpatient Hospital Stay (HOSPITAL_COMMUNITY)
Admission: EM | Admit: 2014-11-05 | Discharge: 2014-11-06 | DRG: 641 | Disposition: A | Discharge status: Home Health | Payer: Medicare Other | Attending: Internal Medicine | Admitting: Internal Medicine

## 2014-11-05 ENCOUNTER — Observation Stay (HOSPITAL_COMMUNITY): Payer: Medicare Other

## 2014-11-05 DIAGNOSIS — G4730 Sleep apnea, unspecified: Secondary | ICD-10-CM | POA: Diagnosis present

## 2014-11-05 DIAGNOSIS — I10 Essential (primary) hypertension: Secondary | ICD-10-CM | POA: Diagnosis present

## 2014-11-05 DIAGNOSIS — K5090 Crohn's disease, unspecified, without complications: Secondary | ICD-10-CM | POA: Diagnosis present

## 2014-11-05 DIAGNOSIS — I2510 Atherosclerotic heart disease of native coronary artery without angina pectoris: Secondary | ICD-10-CM | POA: Diagnosis present

## 2014-11-05 DIAGNOSIS — Z7982 Long term (current) use of aspirin: Secondary | ICD-10-CM | POA: Diagnosis not present

## 2014-11-05 DIAGNOSIS — R531 Weakness: Secondary | ICD-10-CM | POA: Diagnosis not present

## 2014-11-05 DIAGNOSIS — R52 Pain, unspecified: Secondary | ICD-10-CM

## 2014-11-05 DIAGNOSIS — M069 Rheumatoid arthritis, unspecified: Secondary | ICD-10-CM | POA: Diagnosis present

## 2014-11-05 DIAGNOSIS — K219 Gastro-esophageal reflux disease without esophagitis: Secondary | ICD-10-CM | POA: Diagnosis present

## 2014-11-05 DIAGNOSIS — E860 Dehydration: Principal | ICD-10-CM | POA: Diagnosis present

## 2014-11-05 DIAGNOSIS — Z87891 Personal history of nicotine dependence: Secondary | ICD-10-CM | POA: Diagnosis not present

## 2014-11-05 DIAGNOSIS — N400 Enlarged prostate without lower urinary tract symptoms: Secondary | ICD-10-CM | POA: Diagnosis present

## 2014-11-05 DIAGNOSIS — R112 Nausea with vomiting, unspecified: Secondary | ICD-10-CM | POA: Diagnosis not present

## 2014-11-05 DIAGNOSIS — R000 Tachycardia, unspecified: Secondary | ICD-10-CM | POA: Diagnosis not present

## 2014-11-05 DIAGNOSIS — I5042 Chronic combined systolic (congestive) and diastolic (congestive) heart failure: Secondary | ICD-10-CM | POA: Diagnosis present

## 2014-11-05 DIAGNOSIS — R262 Difficulty in walking, not elsewhere classified: Secondary | ICD-10-CM | POA: Diagnosis not present

## 2014-11-05 DIAGNOSIS — E785 Hyperlipidemia, unspecified: Secondary | ICD-10-CM | POA: Diagnosis present

## 2014-11-05 DIAGNOSIS — F0390 Unspecified dementia without behavioral disturbance: Secondary | ICD-10-CM | POA: Diagnosis present

## 2014-11-05 DIAGNOSIS — R739 Hyperglycemia, unspecified: Secondary | ICD-10-CM | POA: Diagnosis present

## 2014-11-05 DIAGNOSIS — M25511 Pain in right shoulder: Secondary | ICD-10-CM | POA: Diagnosis not present

## 2014-11-05 LAB — CBC
HCT: 37.4 % — ABNORMAL LOW (ref 39.0–52.0)
HCT: 37.6 % — ABNORMAL LOW (ref 39.0–52.0)
HEMATOCRIT: 36.6 % — AB (ref 39.0–52.0)
HEMATOCRIT: 40.7 % (ref 39.0–52.0)
HEMOGLOBIN: 12.1 g/dL — AB (ref 13.0–17.0)
HEMOGLOBIN: 12.8 g/dL — AB (ref 13.0–17.0)
Hemoglobin: 12.6 g/dL — ABNORMAL LOW (ref 13.0–17.0)
Hemoglobin: 13.8 g/dL (ref 13.0–17.0)
MCH: 29.6 pg (ref 26.0–34.0)
MCH: 29.9 pg (ref 26.0–34.0)
MCH: 30.1 pg (ref 26.0–34.0)
MCH: 30.5 pg (ref 26.0–34.0)
MCHC: 33.1 g/dL (ref 30.0–36.0)
MCHC: 33.7 g/dL (ref 30.0–36.0)
MCHC: 33.9 g/dL (ref 30.0–36.0)
MCHC: 34 g/dL (ref 30.0–36.0)
MCV: 88.8 fL (ref 78.0–100.0)
MCV: 88.9 fL (ref 78.0–100.0)
MCV: 89.5 fL (ref 78.0–100.0)
MCV: 89.5 fL (ref 78.0–100.0)
PLATELETS: 204 10*3/uL (ref 150–400)
PLATELETS: 203 10*3/uL (ref 150–400)
Platelets: 188 10*3/uL (ref 150–400)
Platelets: 228 10*3/uL (ref 150–400)
RBC: 4.09 MIL/uL — AB (ref 4.22–5.81)
RBC: 4.2 MIL/uL — ABNORMAL LOW (ref 4.22–5.81)
RBC: 4.21 MIL/uL — AB (ref 4.22–5.81)
RBC: 4.58 MIL/uL (ref 4.22–5.81)
RDW: 13.8 % (ref 11.5–15.5)
RDW: 13.9 % (ref 11.5–15.5)
RDW: 13.9 % (ref 11.5–15.5)
RDW: 14 % (ref 11.5–15.5)
WBC: 11.3 10*3/uL — AB (ref 4.0–10.5)
WBC: 11.3 10*3/uL — AB (ref 4.0–10.5)
WBC: 13 10*3/uL — AB (ref 4.0–10.5)
WBC: 15.3 10*3/uL — ABNORMAL HIGH (ref 4.0–10.5)

## 2014-11-05 LAB — DIFFERENTIAL
BASOS ABS: 0 10*3/uL (ref 0.0–0.1)
BASOS ABS: 0 10*3/uL (ref 0.0–0.1)
BASOS PCT: 0 % (ref 0–1)
Basophils Relative: 0 % (ref 0–1)
EOS PCT: 0 % (ref 0–5)
Eosinophils Absolute: 0 10*3/uL (ref 0.0–0.7)
Eosinophils Absolute: 0.1 10*3/uL (ref 0.0–0.7)
Eosinophils Relative: 1 % (ref 0–5)
LYMPHS ABS: 1.5 10*3/uL (ref 0.7–4.0)
Lymphocytes Relative: 5 % — ABNORMAL LOW (ref 12–46)
Lymphocytes Relative: 12 % (ref 12–46)
Lymphs Abs: 0.8 10*3/uL (ref 0.7–4.0)
Monocytes Absolute: 0.9 10*3/uL (ref 0.1–1.0)
Monocytes Absolute: 1.1 10*3/uL — ABNORMAL HIGH (ref 0.1–1.0)
Monocytes Relative: 6 % (ref 3–12)
Monocytes Relative: 9 % (ref 3–12)
NEUTROS ABS: 9.5 10*3/uL — AB (ref 1.7–7.7)
Neutro Abs: 13.7 10*3/uL — ABNORMAL HIGH (ref 1.7–7.7)
Neutrophils Relative %: 89 % — ABNORMAL HIGH (ref 43–77)
Neutrophils Relative %: 78 % — ABNORMAL HIGH (ref 43–77)

## 2014-11-05 LAB — GLUCOSE, CAPILLARY
GLUCOSE-CAPILLARY: 113 mg/dL — AB (ref 70–99)
GLUCOSE-CAPILLARY: 109 mg/dL — AB (ref 70–99)
GLUCOSE-CAPILLARY: 105 mg/dL — AB (ref 70–99)
GLUCOSE-CAPILLARY: 89 mg/dL (ref 70–99)
Glucose-Capillary: 154 mg/dL — ABNORMAL HIGH (ref 70–99)

## 2014-11-05 LAB — COMPREHENSIVE METABOLIC PANEL
ALBUMIN: 3.2 g/dL — AB (ref 3.5–5.2)
ALT: 10 U/L (ref 0–53)
ALT: 22 U/L (ref 0–53)
ANION GAP: 17 — AB (ref 5–15)
AST: 26 U/L (ref 0–37)
AST: 75 U/L — ABNORMAL HIGH (ref 0–37)
Albumin: 3.7 g/dL (ref 3.5–5.2)
Alkaline Phosphatase: 87 U/L (ref 39–117)
Alkaline Phosphatase: 78 U/L (ref 39–117)
Anion gap: 12 (ref 5–15)
BUN: 12 mg/dL (ref 6–23)
BUN: 13 mg/dL (ref 6–23)
CO2: 21 mEq/L (ref 19–32)
CO2: 24 mEq/L (ref 19–32)
CREATININE: 0.89 mg/dL (ref 0.50–1.35)
Calcium: 9.5 mg/dL (ref 8.4–10.5)
Calcium: 9 mg/dL (ref 8.4–10.5)
Chloride: 97 mEq/L (ref 96–112)
Chloride: 101 mEq/L (ref 96–112)
Creatinine, Ser: 0.81 mg/dL (ref 0.50–1.35)
GFR calc Af Amer: >90 mL/min (ref >90)
GFR calc Af Amer: >90 mL/min (ref >90)
GFR calc non Af Amer: 80 mL/min — ABNORMAL LOW (ref >90)
GFR calc non Af Amer: 83 mL/min — ABNORMAL LOW (ref >90)
GLUCOSE: 119 mg/dL — AB (ref 70–99)
Glucose, Bld: 184 mg/dL — ABNORMAL HIGH (ref 70–99)
POTASSIUM: 3.4 meq/L — AB (ref 3.7–5.3)
POTASSIUM: 4 meq/L (ref 3.7–5.3)
SODIUM: 137 meq/L (ref 137–147)
Sodium: 135 mEq/L — ABNORMAL LOW (ref 137–147)
TOTAL PROTEIN: 7.1 g/dL (ref 6.0–8.3)
TOTAL PROTEIN: 8 g/dL (ref 6.0–8.3)
Total Bilirubin: 1 mg/dL (ref 0.3–1.2)
Total Bilirubin: 1 mg/dL (ref 0.3–1.2)

## 2014-11-05 LAB — URINE MICROSCOPIC-ADD ON

## 2014-11-05 LAB — URINALYSIS, ROUTINE W REFLEX MICROSCOPIC
Bilirubin Urine: NEGATIVE
Glucose, UA: NEGATIVE mg/dL
KETONES UR: NEGATIVE mg/dL
LEUKOCYTES UA: NEGATIVE
Nitrite: NEGATIVE
PROTEIN: NEGATIVE mg/dL
Specific Gravity, Urine: 1.021 (ref 1.005–1.030)
Urobilinogen, UA: 0.2 mg/dL (ref 0.0–1.0)
pH: 5.5 (ref 5.0–8.0)

## 2014-11-05 LAB — TYPE AND SCREEN
ABO/RH(D): B POS
Antibody Screen: NEGATIVE

## 2014-11-05 LAB — LIPASE, BLOOD: LIPASE: 26 U/L (ref 11–59)

## 2014-11-05 LAB — SEDIMENTATION RATE: Sed Rate: 48 mm/hr — ABNORMAL HIGH (ref 0–16)

## 2014-11-05 LAB — TROPONIN I: Troponin I: <0.3 ng/mL (ref <0.30)

## 2014-11-05 LAB — HEMOGLOBIN A1C
Hgb A1c MFr Bld: 5.6 % (ref <5.7)
MEAN PLASMA GLUCOSE: 114 mg/dL (ref <117)

## 2014-11-05 LAB — TSH: TSH: 1.21 u[IU]/mL (ref 0.350–4.500)

## 2014-11-05 LAB — ABO/RH: ABO/RH(D): B POS

## 2014-11-05 LAB — CK: Total CK: 3368 U/L — ABNORMAL HIGH (ref 7–232)

## 2014-11-05 LAB — OCCULT BLOOD, POC DEVICE: Fecal Occult Bld: NEGATIVE

## 2014-11-05 MED ORDER — ACETAMINOPHEN 650 MG RE SUPP: 650.0000 mg | Freq: Four times a day (QID) | RECTAL | Status: DC | PRN

## 2014-11-05 MED ORDER — DONEPEZIL HCL 10 MG PO TABS
10.0000 mg | ORAL_TABLET | Freq: Every day | ORAL | Status: DC
  Administered 2014-11-05: 10 mg via ORAL
  Filled 2014-11-05 (×2): qty 1

## 2014-11-05 MED ORDER — NITROGLYCERIN 0.4 MG SL SUBL
0.4000 mg | SUBLINGUAL_TABLET | SUBLINGUAL | Status: DC | PRN
Start: 2014-11-05 — End: 2014-11-06

## 2014-11-05 MED ORDER — METHOTREXATE 2.5 MG PO TABS
20.0000 mg | ORAL_TABLET | ORAL | Status: DC
Start: 2014-11-07 — End: 2014-11-06

## 2014-11-05 MED ORDER — MORPHINE SULFATE 4 MG/ML IJ SOLN
4.0000 mg | Freq: Once | INTRAMUSCULAR | Status: AC
  Administered 2014-11-05: 4 mg via INTRAVENOUS
  Filled 2014-11-05: qty 1

## 2014-11-05 MED ORDER — CARVEDILOL 6.25 MG PO TABS
6.2500 mg | ORAL_TABLET | Freq: Two times a day (BID) | ORAL | Status: DC
  Administered 2014-11-05 – 2014-11-06 (×3): 6.25 mg via ORAL
  Filled 2014-11-05 (×5): qty 1

## 2014-11-05 MED ORDER — PANTOPRAZOLE SODIUM 40 MG IV SOLR
40.0000 mg | Freq: Once | INTRAVENOUS | Status: AC
  Administered 2014-11-05: 40 mg via INTRAVENOUS
  Filled 2014-11-05: qty 40

## 2014-11-05 MED ORDER — SODIUM CHLORIDE 0.9 % IV SOLN
INTRAVENOUS | Status: AC
  Administered 2014-11-05: 06:00:00 via INTRAVENOUS

## 2014-11-05 MED ORDER — TAMSULOSIN HCL 0.4 MG PO CAPS
0.4000 mg | ORAL_CAPSULE | Freq: Every day | ORAL | Status: DC
  Administered 2014-11-05 – 2014-11-06 (×2): 0.4 mg via ORAL
  Filled 2014-11-05 (×2): qty 1

## 2014-11-05 MED ORDER — PRAVASTATIN SODIUM 40 MG PO TABS
40.0000 mg | ORAL_TABLET | Freq: Every day | ORAL | Status: DC
  Administered 2014-11-05: 40 mg via ORAL
  Filled 2014-11-05 (×2): qty 1

## 2014-11-05 MED ORDER — ACETAMINOPHEN 325 MG PO TABS
650.0000 mg | ORAL_TABLET | Freq: Four times a day (QID) | ORAL | Status: DC | PRN
  Administered 2014-11-06 (×2): 650 mg via ORAL
  Filled 2014-11-05 (×2): qty 2

## 2014-11-05 MED ORDER — PANTOPRAZOLE SODIUM 40 MG PO TBEC: 40.0000 mg | DELAYED_RELEASE_TABLET | Freq: Every day | ORAL | Status: DC

## 2014-11-05 MED ORDER — MESALAMINE 1.2 G PO TBEC
2.4000 g | DELAYED_RELEASE_TABLET | Freq: Every day | ORAL | Status: DC
  Filled 2014-11-05 (×2): qty 2

## 2014-11-05 MED ORDER — POTASSIUM CHLORIDE CRYS ER 20 MEQ PO TBCR
40.0000 meq | EXTENDED_RELEASE_TABLET | Freq: Once | ORAL | Status: AC
  Administered 2014-11-05: 40 meq via ORAL
  Filled 2014-11-05: qty 2

## 2014-11-05 MED ORDER — ONDANSETRON HCL 4 MG/2ML IJ SOLN: 4.0000 mg | Freq: Four times a day (QID) | INTRAMUSCULAR | Status: DC | PRN

## 2014-11-05 MED ORDER — FLUTICASONE PROPIONATE 50 MCG/ACT NA SUSP
2.0000 | Freq: Every day | NASAL | Status: DC
  Filled 2014-11-05: qty 16

## 2014-11-05 MED ORDER — OMEGA-3-ACID ETHYL ESTERS 1 G PO CAPS
1.0000 g | ORAL_CAPSULE | Freq: Every day | ORAL | Status: DC
Start: 2014-11-05 — End: 2014-11-06
  Administered 2014-11-05 – 2014-11-06 (×2): 1 g via ORAL
  Filled 2014-11-05 (×2): qty 1

## 2014-11-05 MED ORDER — ONDANSETRON HCL 4 MG PO TABS
4.0000 mg | ORAL_TABLET | Freq: Four times a day (QID) | ORAL | Status: DC | PRN
Start: 2014-11-05 — End: 2014-11-06

## 2014-11-05 MED ORDER — SODIUM CHLORIDE 0.9 % IV BOLUS (SEPSIS)
500.0000 mL | Freq: Once | INTRAVENOUS | Status: AC
  Administered 2014-11-05: 500 mL via INTRAVENOUS

## 2014-11-05 MED ORDER — PANTOPRAZOLE SODIUM 40 MG IV SOLR
40.0000 mg | Freq: Two times a day (BID) | INTRAVENOUS | Status: DC
  Administered 2014-11-05 – 2014-11-06 (×3): 40 mg via INTRAVENOUS
  Filled 2014-11-05 (×4): qty 40

## 2014-11-05 NOTE — ED Notes (Signed)
Bed: XA06 Expected date:  Expected time:  Means of arrival:  Comments: 80 EMS emesis and weakness

## 2014-11-05 NOTE — Evaluation (Signed)
Physical Therapy Evaluation Patient Details Name: Anthony [name] MRN: [id number] DOB: [birth year] Today's Date: 11/05/2014   History of Present Illness  80 yo male adm with weakness adn R shoulder pain; PMHx:  HTN, CAD; xray right shoulder unchanged from 2012, CT head negative  Clinical Impression  Pt will benefit from PT to address deficits below; May benefit from OT Consult as well; Will follow    Follow Up Recommendations Home health PT;Outpatient PT (vs)    Equipment Recommendations  None recommended by PT    Recommendations for Other Services       Precautions / Restrictions Precautions Precautions: Fall Precaution Comments: pt dizzy, orthostatic on adm, not a time of PT eval      Mobility  Bed Mobility               General bed mobility comments: pt in chair  Transfers Overall transfer level: Needs assistance   Transfers: Sit to/from Stand Sit to Stand: Supervision         General transfer comment: incr time d/t R shoulder pain and guarding  Ambulation/Gait Ambulation/Gait assistance: Min guard Ambulation Distance (Feet): 150 Feet Assistive device: None Gait Pattern/deviations: Decreased stride length;Trunk flexed Gait velocity: decr trunk rotation   General Gait Details: cues for safety, pt bumps into objects on right side but denies visual problems  Stairs            Wheelchair Mobility    Modified Rankin (Stroke Patients Only)       Balance Overall balance assessment: Needs assistance         Standing balance support: No upper extremity supported Standing balance-Leahy Scale: Fair Standing balance comment: NT further this session d/t pain                             Pertinent Vitals/Pain Pain Assessment: 0-10 Pain Score: 2  Pain Location: right shoulder Pain Descriptors / Indicators: Constant Pain Intervention(s): Limited activity within patient's tolerance;Repositioned;Monitored during session    Home  Living Family/patient expects to be discharged to:: Private residence Living Arrangements: Spouse/significant other   Type of Home: House Home Access: Stairs to enter   Technical brewer of Steps: 1 Home Layout: One level Home Equipment: Environmental consultant - 2 wheels;Cane - single point;Toilet riser;Grab bars - tub/shower      Prior Function Level of Independence: Independent               Hand Dominance        Extremity/Trunk Assessment   Upper Extremity Assessment: Defer to OT evaluation;RUE deficits/detail RUE Deficits / Details: hand/wrist/elbow grossly [facility] for AROM although elbow extension slightly guarded; AAROM ER/IR grossly WFL; fwd flexion and abduction painful; painful to light palpation over superior-lateral shoulder(bursitis ?  type pain)         Lower Extremity Assessment: Overall WFL for tasks assessed         Communication   Communication: No difficulties  Cognition Arousal/Alertness: Awake/alert Behavior During Therapy: WFL for tasks assessed/performed Overall Cognitive Status: Within Functional Limits for tasks assessed                      General Comments      Exercises        Assessment/Plan    PT Assessment Patient needs continued PT services  PT Diagnosis Difficulty walking   PT Problem List Decreased mobility;Decreased activity tolerance;Decreased strength;Decreased range of motion;Decreased balance  PT  Treatment Interventions     PT Goals (Current goals can be found in the Care Plan section) Acute Rehab PT Goals Patient Stated Goal: decr pain PT Goal Formulation: With patient Time For Goal Achievement: 11/12/14 Potential to Achieve Goals: Good    Frequency     Barriers to discharge        Co-evaluation               End of Session Equipment Utilized During Treatment: Gait belt Activity Tolerance: Patient tolerated treatment well Patient left: in chair;with call bell/phone within reach;with chair alarm set;with  family/visitor present           Time: 1435-1451 PT Time Calculation (min) (ACUTE ONLY): 16 min   Charges:   PT Evaluation $Initial PT Evaluation Tier I: 1 Procedure PT Treatments $Gait Training: 8-22 mins   PT G Codes:          Anthony [name] 11/17/14, 3:11 PM

## 2014-11-05 NOTE — H&P (Addendum)
[facility] History and Physical  GERMAIN KOOPMANN MVH:[id number] DOB: 1934/06/05 DOA: 11/05/2014  Referring physician: ER physician. PCP: [name], MD   Chief Complaint: Weakness.  HPI: Anthony [name] is a 80 y.o. male with history of chronic combined systolic and diastolic heart failure last EF measured last month was 45%, hyperlipidemia, nonobstructive CAD, hypertension and dementia was brought to the ER the patient was feeling weak. Patient's wife states that patient had one episode of nausea vomiting early yesterday morning which was coffee-ground. Patient not have any abdominal pain or diarrhea. Patient was recently diagnosed with Crohn's disease and is on Lialda but it's not mentioned in the medication list. Late in the evening patient found very weak to walk. In the ER patient was initially found to be tachycardic and orthostatic which improved with 5 mL normal saline bolus. On exam patient on lying down does not feel weak but on trying to make him walk patient feels dizzy. CT head is pending. In addition patient also was complaining of right shoulder pain which started having since yesterday morning. Denies any fall. Pain is mostly on trying to move his shoulder abduction. Denies any fever chills chest pain shortness of breath. Patient has been admitted for weakness probably from dehydration with nausea vomiting coffee-ground vomitus. Patient states he has had gastric ulcer 20 years ago for which he had EGD done at that time.   Review of Systems: As presented in the history of presenting illness, rest negative.  Past Medical History  Diagnosis Date  . GERD (gastroesophageal reflux disease)   . Hyperlipidemia   . Heart attack 1996, 06/2011  . Coronary artery disease   . Hypertension   . Pneumonia 1993  . Sleep apnea     sleep study years ago- has apnea but did not qualify for CPAP  . Prostate troubles     states take alternating meds for bladder/prostate control-  "age thing"  . Arthritis     rheumatoid-  Dr Barkley Boards  . CHF (congestive heart failure)     chronic systolic heart failure per Dr [name] [facility] note 09/23/11.   EKG 4/12, stress test  and cath from 8/12 on chart.  Clearance with LOV Dr [name] on chart  . Dysrhythmia     nonsustained,asymptomatic VT per Dr [name] office note  resulting in cath   Past Surgical History  Procedure Laterality Date  . Wrist surgery  04/25/91    left  . Knee arthroscopy  1976    right  . Meniscus debridement  1996    left  . Trigger finger release  10/10/2009    rt  . Cardiac catheterization      1996/ 2012 report on chart  . Hernia repair  8413    umbilical  . Colonoscopy    . Inguinal hernia repair  11/04/2011    Procedure: HERNIA REPAIR INGUINAL ADULT;  Surgeon: Pedro Earls, MD;  Location: [facility];  Service: General;  Laterality: Right;  Right Inguinal Hernia Repair with Mesh   Social History:  reports that he quit smoking about 44 years ago. His smoking use included Cigarettes. He has a 60 pack-year smoking history. He has never used smokeless tobacco. He reports that he does not drink alcohol or use illicit drugs. Where does patient live home. Can patient participate in ADLs? Yes.  No Known Allergies  Family History:  Family History  Problem Relation Age of Onset  . Heart disease Mother   . Rheum arthritis Mother   .  Emphysema Father       Prior to Admission medications   Medication Sig Start Date End Date Taking? Authorizing Provider  Ascorbic Acid (VITAMIN C) 1000 MG tablet Take 1,000 mg by mouth daily.    Yes Historical Provider, MD  aspirin 325 MG tablet Take 325 mg by mouth every morning.    Yes Historical Provider, MD  beclomethasone (BECONASE-AQ) 42 MCG/SPRAY nasal spray Place 2 sprays into the nose 2 (two) times daily as needed. Allergies    Yes Historical Provider, MD  CALCIUM-VITAMIN D PO Take 1,000 mg by mouth daily.    Yes Historical Provider, MD  carvedilol (COREG) 6.25 MG tablet  Take 6.25 mg by mouth 2 (two) times daily with a meal.  07/14/11  Yes Historical Provider, MD  donepezil (ARICEPT) 10 MG tablet Take 10 mg by mouth at bedtime.   Yes Historical Provider, MD  fish oil-omega-3 fatty acids 1000 MG capsule Take 1 g by mouth daily.    Yes Historical Provider, MD  folic acid (FOLVITE) 951 MCG tablet Take 400 mcg by mouth 2 (two) times daily.    Yes Historical Provider, MD  methotrexate (RHEUMATREX) 2.5 MG tablet Take 8 tablets by mouth Once a week. Wednesday 07/02/11  Yes Historical Provider, MD  nitroGLYCERIN (NITROSTAT) 0.4 MG SL tablet Place 0.4 mg under the tongue every 5 (five) minutes as needed. Chest pain    Yes Historical Provider, MD  omeprazole (PRILOSEC) 20 MG capsule Take 20 mg by mouth daily.  07/28/11  Yes Historical Provider, MD  pravastatin (PRAVACHOL) 40 MG tablet Take 1 tablet (40 mg total) by mouth at bedtime. 06/25/14  Yes Sinclair Grooms, MD  PRESCRIPTION MEDICATION Take 2 tablets by mouth daily. Liada---Crohns   Yes Historical Provider, MD  ranitidine (ZANTAC) 300 MG capsule Take 300 mg by mouth At bedtime.  07/30/11  Yes Historical Provider, MD  tamsulosin (FLOMAX) 0.4 MG CAPS capsule Take 1 capsule by mouth daily. 10/01/14  Yes Historical Provider, MD  Vitamins A & D (VITAMIN A & D) 10000-400 UNITS CAPS Take 1 tablet by mouth daily.    Yes Historical Provider, MD    Physical Exam: Filed Vitals:   11/05/14 0424 11/05/14 0500 11/05/14 0510 11/05/14 0513  BP: 133/78  145/73   Pulse: 91  92   Temp:   97.7 F (36.5 C)   TempSrc:   Oral   Resp: 18  18   Height:  6' 1"  (1.854 m)    Weight:    79.4 kg (175 lb 0.7 oz)  SpO2: 96%  95%      General:  Well-developed and nourished.  Eyes: Anicteric no pallor.  ENT: No discharge from the ears eyes nose normal.  Neck: No mass felt.  Cardiovascular: S1 and S2 heard.  Respiratory: No rhonchi or crepitations.  Abdomen: Soft nontender bowel sounds present.  Skin: No rash.  Musculoskeletal: No  edema. Patient has increased pain on moving his right shoulder particularly abduction.  Psychiatric: Appears normal.  Neurologic: Alert awake oriented to time place and person. Moves all extremities 5 x 5. No facial asymmetry. No tongue deviation.  Labs on Admission:  Basic Metabolic Panel:  Recent Labs Lab 11/05/14 0012  NA 135*  K 4.0  CL 97  CO2 21  GLUCOSE 184*  BUN 13  CREATININE 0.89  CALCIUM 9.5   Liver Function Tests:  Recent Labs Lab 11/05/14 0012  AST 26  ALT 10  ALKPHOS 87  BILITOT 1.0  PROT 8.0  ALBUMIN 3.7    Recent Labs Lab 11/05/14 0012  LIPASE 26   No results for input(s): AMMONIA in the last 168 hours. CBC:  Recent Labs Lab 11/05/14 0012  WBC 15.3*  NEUTROABS 13.7*  HGB 13.8  HCT 40.7  MCV 88.9  PLT 228   Cardiac Enzymes: No results for input(s): CKTOTAL, CKMB, CKMBINDEX, TROPONINI in the last 168 hours.  BNP (last 3 results) No results for input(s): PROBNP in the last 8760 hours. CBG: No results for input(s): GLUCAP in the last 168 hours.  Radiological Exams on Admission: Dg Shoulder Right  11/05/2014   CLINICAL DATA:  Shoulder pain.  Rheumatoid arthritis.  EXAM: RIGHT SHOULDER - 2+ VIEW  COMPARISON:  05/23/2011  FINDINGS: Suboptimal study due to difficulty with patient positioning. Best obtainable exam shows no evidence of fracture or dislocation. No erosive changes. No significant joint narrowing or change from 2012.  IMPRESSION: No acute abnormality or change since 2012.   Electronically Signed   By: Jorje Guild M.D.   On: 11/05/2014 02:15     Assessment/Plan Active Problems:   Rheumatoid arthritis   HLD (hyperlipidemia)   Chronic combined systolic and diastolic heart failure   Weakness   Nausea with vomiting   Generalized weakness   1. Weakness - may be related to possible dehydration versus could be also related to patient's rheumatoid arthritis. We have to rule out GI bleed for which we have to follow CBC. CT  head is pending. Check CK levels troponin and sedimentation rate and TSH. Gently hydrated given that patient has history of CHF. Physical therapy. 2. Nausea vomiting with coffee-ground vomitus - given the history of gastric ulcers at this time we will keep patient nothing by mouth and CBC checks and Protonix IV for now. If there is evidence of hemoglobin drop then GI consult. Patient states that he is not taking aspirin though it is listed in his medications. 3. Chronic combined systolic and diastolic heart failure last year measured was last month was 45% - patient is not on any diuretics. Appears compensated at this time. Closely observe respiratory status the patient is receiving IV fluids. 4. Rheumatoid arthritis - patient has increasing pain in both shoulder particularly right side. X-rays do not show anything acute. Patient is on methotrexate. Check sedimentation rate. 5. Hyperglycemia - check hemoglobin A1c. 6. History of Crohn's disease - as per patient by patient is on Lialda. At this time patient abdomen appears benign and denies any diarrhea. 7. Hypertension - continue present medications. 8. History of dementia - continue present medications. 9. BPH - continue present medications. 10. Nonobstructive CAD - denies any chest pain. Check troponin.    Code Status: Full code.  Family Communication: Patient's wife at the bedside.  Disposition Plan: Admit to inpatient.    Shevawn Langenberg N. [facility] Pager [phone].  If 7PM-7AM, please contact night-coverage [email or web address] Password [email or web address] 11/05/2014, 5:42 AM

## 2014-11-05 NOTE — ED Notes (Addendum)
Awake. Verbally responsive. A/O x4. Resp even and unlabored. No audible adventitious breath sounds noted. ABC's intact.  

## 2014-11-05 NOTE — ED Provider Notes (Signed)
CSN: [id number]     Arrival date & time 11/05/14  0000 History   First MD Initiated Contact with Patient 11/05/14 [phone]     Chief Complaint  Patient presents with  . Weakness  . Emesis     (Consider location/radiation/quality/duration/timing/severity/associated sxs/prior Treatment) HPI 80 year old male presents emergency department from home with complaint of one episode of nausea and vomiting containing coffee-ground emesis, generalized weakness and right shoulder pain.  Patient was well yesterday.  Per his wife.  This morning woke complaining of right shoulder pain.  She reports that he had a large amount of vomiting of coffee-ground emesis initially thought to be barbecue that he ate last night.  No further vomiting or complaint of abdominal pain.  Patient has gotten gradually more weak and complaining of diffuse myalgias throughout the day.  No fever or chills.  No change in stool.  Patient has history of Crohn's and stomach ulcers.  No prior history of GI bleed.  Patient is on methotrexate and occasionally takes aspirin.  He denies any other NSAIDs. Past Medical History  Diagnosis Date  . GERD (gastroesophageal reflux disease)   . Hyperlipidemia   . Heart attack 1996, 06/2011  . Coronary artery disease   . Hypertension   . Pneumonia 1993  . Sleep apnea     sleep study years ago- has apnea but did not qualify for CPAP  . Prostate troubles     states take alternating meds for bladder/prostate control- "age thing"  . Arthritis     rheumatoid-  Dr Barkley Boards  . CHF (congestive heart failure)     chronic systolic heart failure per Dr [name] [facility] note 09/23/11.   EKG 4/12, stress test  and cath from 8/12 on chart.  Clearance with LOV Dr [name] on chart  . Dysrhythmia     nonsustained,asymptomatic VT per Dr [name] office note  resulting in cath   Past Surgical History  Procedure Laterality Date  . Wrist surgery  04/25/91    left  . Knee arthroscopy  1976    right  . Meniscus debridement   1996    left  . Trigger finger release  10/10/2009    rt  . Cardiac catheterization      1996/ 2012 report on chart  . Hernia repair  1583    umbilical  . Colonoscopy    . Inguinal hernia repair  11/04/2011    Procedure: HERNIA REPAIR INGUINAL ADULT;  Surgeon: Pedro Earls, MD;  Location: [facility];  Service: General;  Laterality: Right;  Right Inguinal Hernia Repair with Mesh   Family History  Problem Relation Age of Onset  . Heart disease Mother   . Rheum arthritis Mother   . Emphysema Father    History  Substance Use Topics  . Smoking status: Former Smoker -- 3.00 packs/day for 20 years    Types: Cigarettes    Quit date: 11/01/1970  . Smokeless tobacco: Never Used     Comment: quit 1967  . Alcohol Use: No    Review of Systems   See History of Present Illness; otherwise all other systems are reviewed and negative  Allergies  Review of patient's allergies indicates no known allergies.  Home Medications   Prior to Admission medications   Medication Sig Start Date End Date Taking? Authorizing Provider  Ascorbic Acid (VITAMIN C) 1000 MG tablet Take 1,000 mg by mouth daily.    Yes Historical Provider, MD  aspirin 325 MG tablet Take 325 mg by  mouth every morning.    Yes Historical Provider, MD  beclomethasone (BECONASE-AQ) 42 MCG/SPRAY nasal spray Place 2 sprays into the nose 2 (two) times daily as needed. Allergies    Yes Historical Provider, MD  CALCIUM-VITAMIN D PO Take 1,000 mg by mouth daily.    Yes Historical Provider, MD  carvedilol (COREG) 6.25 MG tablet Take 6.25 mg by mouth 2 (two) times daily with a meal.  07/14/11  Yes Historical Provider, MD  donepezil (ARICEPT) 10 MG tablet Take 10 mg by mouth at bedtime.   Yes Historical Provider, MD  fish oil-omega-3 fatty acids 1000 MG capsule Take 1 g by mouth daily.    Yes Historical Provider, MD  folic acid (FOLVITE) 329 MCG tablet Take 400 mcg by mouth 2 (two) times daily.    Yes Historical Provider, MD  methotrexate  (RHEUMATREX) 2.5 MG tablet Take 8 tablets by mouth Once a week. Wednesday 07/02/11  Yes Historical Provider, MD  nitroGLYCERIN (NITROSTAT) 0.4 MG SL tablet Place 0.4 mg under the tongue every 5 (five) minutes as needed. Chest pain    Yes Historical Provider, MD  omeprazole (PRILOSEC) 20 MG capsule Take 20 mg by mouth daily.  07/28/11  Yes Historical Provider, MD  pravastatin (PRAVACHOL) 40 MG tablet Take 1 tablet (40 mg total) by mouth at bedtime. 06/25/14  Yes Sinclair Grooms, MD  PRESCRIPTION MEDICATION Take 2 tablets by mouth daily. Liada---Crohns   Yes Historical Provider, MD  ranitidine (ZANTAC) 300 MG capsule Take 300 mg by mouth At bedtime.  07/30/11  Yes Historical Provider, MD  tamsulosin (FLOMAX) 0.4 MG CAPS capsule Take 1 capsule by mouth daily. 10/01/14  Yes Historical Provider, MD  Vitamins A & D (VITAMIN A & D) 10000-400 UNITS CAPS Take 1 tablet by mouth daily.    Yes Historical Provider, MD   BP 139/80 mmHg  Pulse 112  Temp(Src) 98.7 F (37.1 C) (Oral)  Resp 23  SpO2 96% Physical Exam  Constitutional: He is oriented to person, place, and time. He appears well-developed and well-nourished. He appears distressed (uncomfortable appearing).  HENT:  Head: Normocephalic and atraumatic.  Nose: Nose normal.  Mouth/Throat: Oropharynx is clear and moist.  Eyes: Conjunctivae and EOM are normal. Pupils are equal, round, and reactive to light.  Neck: Normal range of motion. Neck supple. No JVD present. No tracheal deviation present. No thyromegaly present.  Cardiovascular: Normal rate, regular rhythm, normal heart sounds and intact distal pulses.  Exam reveals no gallop and no friction rub.   No murmur heard. Pulmonary/Chest: Effort normal and breath sounds normal. No stridor. No respiratory distress. He has no wheezes. He has no rales. He exhibits no tenderness.  Abdominal: Soft. Bowel sounds are normal. He exhibits no distension and no mass. There is no tenderness. There is no rebound and no  guarding.  Genitourinary: Rectum normal and prostate normal. Guaiac negative stool.  Musculoskeletal: Normal range of motion. He exhibits tenderness (Patient has tenderness with palpation and attempted range of motion over right shoulder.). He exhibits no edema.  Lymphadenopathy:    He has no cervical adenopathy.  Neurological: He is alert and oriented to person, place, and time. He displays normal reflexes. He exhibits normal muscle tone. Coordination normal.  Skin: Skin is warm and dry. No rash noted. No erythema. No pallor.  Psychiatric: He has a normal mood and affect. His behavior is normal. Judgment and thought content normal.  Nursing note and vitals reviewed.   ED Course  Procedures (including  critical care time) Labs Review Labs Reviewed  CBC - Abnormal; Notable for the following:    WBC 15.3 (*)    All other components within normal limits  COMPREHENSIVE METABOLIC PANEL - Abnormal; Notable for the following:    Sodium 135 (*)    Glucose, Bld 184 (*)    GFR calc non Af Amer 80 (*)    Anion gap 17 (*)    All other components within normal limits  URINALYSIS, ROUTINE W REFLEX MICROSCOPIC - Abnormal; Notable for the following:    Color, Urine AMBER (*)    APPearance HAZY (*)    Hgb urine dipstick LARGE (*)    All other components within normal limits  DIFFERENTIAL - Abnormal; Notable for the following:    Neutrophils Relative % 89 (*)    Neutro Abs 13.7 (*)    Lymphocytes Relative 5 (*)    All other components within normal limits  URINE MICROSCOPIC-ADD ON - Abnormal; Notable for the following:    Casts HYALINE CASTS (*)    All other components within normal limits  LIPASE, BLOOD  POC OCCULT BLOOD, ED  TYPE AND SCREEN  ABO/RH    Imaging Review Dg Shoulder Right  11/05/2014   CLINICAL DATA:  Shoulder pain.  Rheumatoid arthritis.  EXAM: RIGHT SHOULDER - 2+ VIEW  COMPARISON:  05/23/2011  FINDINGS: Suboptimal study due to difficulty with patient positioning. Best  obtainable exam shows no evidence of fracture or dislocation. No erosive changes. No significant joint narrowing or change from 2012.  IMPRESSION: No acute abnormality or change since 2012.   Electronically Signed   By: Jorje Guild M.D.   On: 11/05/2014 02:15     EKG Interpretation   Date/Time:  Monday November 05 2014 00:05:31 EST Ventricular Rate:  118 PR Interval:  141 QRS Duration: 94 QT Interval:  344 QTC Calculation: 482 R Axis:   -52 Text Interpretation:  Sinus tachycardia Left anterior fascicular block  Probable anteroseptal infarct, old Confirmed by Audri Kozub  MD, Navdeep Fessenden ([id number]) on  11/05/2014 1:06:24 AM      MDM   Final diagnoses:  Pain  Generalized weakness  Nausea and vomiting, vomiting of unspecified type  Tachycardia  Dehydration    80 year old male with one episode of nausea vomiting with coffee-ground emesis, orthostatic changes with standing, patient has been persistently tachycardic despite fluids.  Hemoglobin and hematocrit have been stable.  Will discuss with hospitalist for admission for dehydration and generalized weakness.  I am uncomfortable giving a large amounts of fluids.  Given history of CHF.    Kalman Drape, MD 11/05/14 [phone]

## 2014-11-05 NOTE — ED Notes (Signed)
Pt presents via EMS with c/o weakness and vomiting. Pt had an episode of vomiting this morning, dark in color, possible hematemesis. Pt reports increased weakness throughout the day, no other episodes of nausea and vomiting. BP 130/80, HR 122, R 18, 94% RA, CBG 162 for EMS. Pt denied any shortness of breath, dizziness, or pain.

## 2014-11-05 NOTE — Progress Notes (Signed)
[facility] PROGRESS NOTE  Anthony [name] PQZ:[id number] DOB: December 23, 1933 DOA: 11/05/2014 PCP: Anthony Screws, MD  Assessment/Plan: 1. Weakness 1. Possibly secondary to mild dehydration per below 2. Improved overnight with hydration 2. Nausea/vominting 1. Per pt, sx began relatively shortly after consuming seafood from a local seafood restaurant 2. Symptoms have improved since admission overnight 3. Started clears and will advance as tolerated 4. Question food-borne gastroenteritis as an etiology, although there are no sick contacts 3. Chronic systolic/diastolic CHF 1. Stable. 2. Appears compensated 3. Cont to monitor closely 4. RA 1. Stable 2. Does have R shoulder stiffness this AM 5. Hyperglycemia 1. Stable and controlle 6. Crohn'Anthony disease 1. Stable 2. Will cont home mesalamine for now 7. HTN  1. BP stable, controlled 2. On coreg, 8. Dementia 1. On aricept 9. BPH 1. Cont flomax 10. CAD 1. Stable 2. Denies chest pain or sob 11. DVT propylaixs 1. SCD'Anthony  Code Status: Full Family Communication: Pt in room (indicate person spoken with, relationship, and if by phone, the number) Disposition Plan: Pending   Consultants:    Procedures:    Antibiotics:   (indicate start date, and stop date if known)  HPI/Subjective: Feels better today. Eager to try PO intake  Objective: Filed Vitals:   11/05/14 0500 11/05/14 0510 11/05/14 0513 11/05/14 1400  BP:  145/73  119/63  Pulse:  92  82  Temp:  97.7 F (36.5 C)  98.6 F (37 C)  TempSrc:  Oral  Oral  Resp:  18  18  Height: 6' 1"  (1.854 m)     Weight:   79.4 kg (175 lb 0.7 oz)   SpO2:  95%  98%    Intake/Output Summary (Last 24 hours) at 11/05/14 1507 Last data filed at 11/05/14 1400  Gross per 24 hour  Intake  572.5 ml  Output    150 ml  Net  422.5 ml   Filed Weights   11/05/14 0513  Weight: 79.4 kg (175 lb 0.7 oz)    Exam:   General:  Awake, in nad  Cardiovascular: regular, s1,  s2  Respiratory: normal resp effort, no wheezing  Abdomen: soft, nondistended  Musculoskeletal: perfused, no clubbing   Data Reviewed: Basic Metabolic Panel:  Recent Labs Lab 11/05/14 0012 11/05/14 0605  NA 135* 137  K 4.0 3.4*  CL 97 101  CO2 21 24  GLUCOSE 184* 119*  BUN 13 12  CREATININE 0.89 0.81  CALCIUM 9.5 9.0   Liver Function Tests:  Recent Labs Lab 11/05/14 0012 11/05/14 0605  AST 26 75*  ALT 10 22  ALKPHOS 87 78  BILITOT 1.0 1.0  PROT 8.0 7.1  ALBUMIN 3.7 3.2*    Recent Labs Lab 11/05/14 0012  LIPASE 26   No results for input(Anthony): AMMONIA in the last 168 hours. CBC:  Recent Labs Lab 11/05/14 0012 11/05/14 0605 11/05/14 0915 11/05/14 1321  WBC 15.3* 13.0* 11.3* 11.3*  NEUTROABS 13.7* 9.5*  --   --   HGB 13.8 12.6* 12.1* 12.8*  HCT 40.7 37.4* 36.6* 37.6*  MCV 88.9 88.8 89.5 89.5  PLT 228 204 188 203   Cardiac Enzymes:  Recent Labs Lab 11/05/14 0605  CKTOTAL 3368*  TROPONINI <0.30   BNP (last 3 results) No results for input(Anthony): PROBNP in the last 8760 hours. CBG:  Recent Labs Lab 11/05/14 0929 11/05/14 1200  GLUCAP 113* 109*    No results found for this or any previous visit (from the past 240 hour(Anthony)).  Studies: Dg Shoulder Right  11/05/2014   CLINICAL DATA:  Shoulder pain.  Rheumatoid arthritis.  EXAM: RIGHT SHOULDER - 2+ VIEW  COMPARISON:  05/23/2011  FINDINGS: Suboptimal study due to difficulty with patient positioning. Best obtainable exam shows no evidence of fracture or dislocation. No erosive changes. No significant joint narrowing or change from 2012.  IMPRESSION: No acute abnormality or change since 2012.   Electronically Signed   By: Jorje Guild M.D.   On: 11/05/2014 02:15   Ct Head Wo Contrast  11/05/2014   CLINICAL DATA:  Difficulty walking.  EXAM: CT HEAD WITHOUT CONTRAST  TECHNIQUE: Contiguous axial images were obtained from the base of the skull through the vertex without intravenous contrast.   COMPARISON:  None.  FINDINGS: Skull and Sinuses:No acute fracture or destructive process. Mild mucosal thickening in the inferior and posterior left maxillary sinus.  Orbits: No acute abnormality.  Brain: No evidence of acute infarction, hemorrhage, hydrocephalus, or mass lesion/mass effect.  There is generalized brain volume loss which is typical for age and without lobar predilection. Mild-moderate chronic small vessel disease with ischemic gliosis best visualized around the lateral ventricles.  IMPRESSION: 1. No acute intracranial findings. 2. Mild to moderate chronic small vessel disease.   Electronically Signed   By: Jorje Guild M.D.   On: 11/05/2014 06:00    Scheduled Meds: . carvedilol  6.25 mg Oral BID WC  . donepezil  10 mg Oral QHS  . fluticasone  2 spray Each Nare Daily  . [START ON 11/07/2014] methotrexate  20 mg Oral Q Wed  . omega-3 acid ethyl esters  1 g Oral Daily  . pantoprazole (PROTONIX) IV  40 mg Intravenous Q12H  . pravastatin  40 mg Oral QHS  . tamsulosin  0.4 mg Oral Daily   Continuous Infusions: . sodium chloride 50 mL/hr at 11/05/14 0545    Active Problems:   Rheumatoid arthritis   HLD (hyperlipidemia)   Chronic combined systolic and diastolic heart failure   Weakness   Nausea with vomiting   Generalized weakness    Time spent: 33mn    Anthony [name], [facility] Pager [name] If 7PM-7AM, please contact night-coverage at [email or web address], password [email or web address]12/14/2015, 3:07 PM  LOS: 0 days

## 2014-11-06 DIAGNOSIS — R112 Nausea with vomiting, unspecified: Secondary | ICD-10-CM

## 2014-11-06 DIAGNOSIS — M069 Rheumatoid arthritis, unspecified: Secondary | ICD-10-CM

## 2014-11-06 LAB — GLUCOSE, CAPILLARY
Glucose-Capillary: 97 mg/dL (ref 70–99)
Glucose-Capillary: 99 mg/dL (ref 70–99)

## 2014-11-06 LAB — CBC
HCT: 37.7 % — ABNORMAL LOW (ref 39.0–52.0)
Hemoglobin: 12.3 g/dL — ABNORMAL LOW (ref 13.0–17.0)
MCH: 29.5 pg (ref 26.0–34.0)
MCHC: 32.6 g/dL (ref 30.0–36.0)
MCV: 90.4 fL (ref 78.0–100.0)
PLATELETS: 209 10*3/uL (ref 150–400)
RBC: 4.17 MIL/uL — ABNORMAL LOW (ref 4.22–5.81)
RDW: 14 % (ref 11.5–15.5)
WBC: 10.3 10*3/uL (ref 4.0–10.5)

## 2014-11-06 LAB — BASIC METABOLIC PANEL
Anion gap: 12 (ref 5–15)
BUN: 10 mg/dL (ref 6–23)
CALCIUM: 9.2 mg/dL (ref 8.4–10.5)
CO2: 23 mEq/L (ref 19–32)
CREATININE: 0.85 mg/dL (ref 0.50–1.35)
Chloride: 101 mEq/L (ref 96–112)
GFR calc Af Amer: >90 mL/min (ref >90)
GFR, EST NON AFRICAN AMERICAN: 81 mL/min — AB (ref >90)
Glucose, Bld: 103 mg/dL — ABNORMAL HIGH (ref 70–99)
Potassium: 3.9 mEq/L (ref 3.7–5.3)
SODIUM: 136 meq/L — AB (ref 137–147)

## 2014-11-06 MED ORDER — IBUPROFEN 200 MG PO TABS: 200.0000 mg | ORAL_TABLET | Freq: Four times a day (QID) | ORAL | Status: DC | PRN

## 2014-11-06 NOTE — Care Management Note (Signed)
    Page 1 of 1   11/06/2014     12:12:57 PM CARE MANAGEMENT NOTE 11/06/2014  Patient:  Anthony [name], Anthony [name]   Account Number:  [id number]  Date Initiated:  11/06/2014  Documentation initiated by:  Sunday Spillers  Subjective/Objective Assessment:   80 yo male admitted with weakness, gastroenteritis. PTA lived at home with spouse.     Action/Plan:   Home when stable   Anticipated DC Date:  11/06/2014   Anticipated DC Plan:  [place]  CM consult      [facility] Choice  HOME HEALTH   Choice offered to / List presented to:  C-3 Spouse        HH arranged  [place].   Status of service:  Completed, signed off Medicare Important Message given?   (If response is "NO", the following Medicare IM given date fields will be blank) Date Medicare IM given:   Medicare IM given by:   Date Additional Medicare IM given:   Additional Medicare IM given by:    Discharge Disposition:  [place]  Per UR Regulation:  Reviewed for med. necessity/level of care/duration of stay  If discussed at [place] of Stay Meetings, dates discussed:    Comments:

## 2014-11-06 NOTE — Discharge Summary (Signed)
Physician Discharge Summary  Anthony [name] EVO:[id number] DOB: [birth year] DOA: 11/05/2014  PCP: [name], MD  Admit date: 11/05/2014 Discharge date: 11/06/2014  Time spent: 30 minutes  Recommendations for Outpatient Follow-up:  1. Follow up with PCP in 1-2 weeks  Discharge Diagnoses:  Active Problems:   Rheumatoid arthritis   HLD (hyperlipidemia)   Chronic combined systolic and diastolic heart failure   Weakness   Nausea with vomiting   Generalized weakness   Dehydration   Difficulty walking   Discharge Condition: Improved  Diet recommendation: Regular  Filed Weights   11/05/14 0513  Weight: 79.4 kg (175 lb 0.7 oz)    History of present illness:  Please see admit h and p from 12/14 for details. Briefly pt presents with increased weakness. Pt was noted to have presenting leukocytosis and complaints of nausea/vomiting prior to admit. The patient was admitted for further work up.  Hospital Course:  1. Weakness 1. Possibly secondary to mild dehydration per below 2. Improved overnight with hydration 3. Seen by PT who recommends [place] [facility] 2. Nausea/vominting 1. Per pt, sx began relatively shortly after consuming seafood from a local seafood restaurant 2. Symptoms have improved since admission overnight without antimicrobial intervention 3. Started clears and will advanced to regular diet 4. Question food-borne gastroenteritis as an etiology, although there are no sick contacts 5. Pt seems to be at baseline at time of discharge 3. Chronic systolic/diastolic CHF 1. Stable. 2. Appears compensated 3. Cont to monitor closely 4. RA 1. Stable 2. Does have R shoulder stiffness in the AM 3. Ibuprofen as needed, trying to avoid prednisone given hx of Crohn's disease 5. Hyperglycemia 1. Stable and controlled 6. Crohn's disease 1. Stable 2. Cont home mesalamine 7. HTN 1. BP stable, controlled 2. On coreg, 8. Dementia 1. On aricept 9. BPH 1. Cont  flomax 10. CAD 1. Stable 2. Denied chest pain or sob 11. DVT propylaixs 1. SCD's  Consultations:  none  Discharge Exam: Filed Vitals:   11/05/14 0513 11/05/14 1400 11/05/14 2326 11/06/14 0455  BP:  119/63 129/63 143/63  Pulse:  82 79 82  Temp:  98.6 F (37 C) 98.3 F (36.8 C) 98.2 F (36.8 C)  TempSrc:  Oral Oral Oral  Resp:  18 16 16   Height:      Weight: 79.4 kg (175 lb 0.7 oz)     SpO2:  98% 97% 95%    General: Awake, in nad Cardiovascular: regular, s1, s2 Respiratory: normal resp effort, no wheezing  Discharge Instructions     Medication List    TAKE these medications        aspirin 325 MG tablet  Take 325 mg by mouth every morning.     beclomethasone 42 MCG/SPRAY nasal spray  Commonly known as:  BECONASE-AQ  - Place 2 sprays into the nose 2 (two) times daily as needed. Allergies  -      CALCIUM-VITAMIN D PO  Take 1,000 mg by mouth daily.     carvedilol 6.25 MG tablet  Commonly known as:  COREG  Take 6.25 mg by mouth 2 (two) times daily with a meal.     donepezil 10 MG tablet  Commonly known as:  ARICEPT  Take 10 mg by mouth at bedtime.     fish oil-omega-3 fatty acids 1000 MG capsule  Take 1 g by mouth daily.     folic acid 299 MCG tablet  Commonly known as:  FOLVITE  Take 400 mcg by mouth 2 (  two) times daily.     ibuprofen 200 MG tablet  Commonly known as:  ADVIL,MOTRIN  Take 1 tablet (200 mg total) by mouth every 6 (six) hours as needed for moderate pain.     mesalamine 1.2 G EC tablet  Commonly known as:  LIALDA  Take 2.4 g by mouth daily with breakfast.     methotrexate 2.5 MG tablet  Commonly known as:  RHEUMATREX  Take 8 tablets by mouth Once a week. Wednesday     nitroGLYCERIN 0.4 MG SL tablet  Commonly known as:  NITROSTAT  - Place 0.4 mg under the tongue every 5 (five) minutes as needed. Chest pain  -      omeprazole 20 MG capsule  Commonly known as:  PRILOSEC  Take 20 mg by mouth daily.     pravastatin 40 MG tablet   Commonly known as:  PRAVACHOL  Take 1 tablet (40 mg total) by mouth at bedtime.     ranitidine 300 MG capsule  Commonly known as:  ZANTAC  Take 300 mg by mouth At bedtime.     tamsulosin 0.4 MG Caps capsule  Commonly known as:  FLOMAX  Take 1 capsule by mouth daily.     Vitamin A & D 10000-400 UNITS Caps  Take 1 tablet by mouth daily.     vitamin C 1000 MG tablet  Take 1,000 mg by mouth daily.       No Known Allergies Follow-up Information    Follow up with GATES,ROBERT NEVILL, MD. Schedule an appointment as soon as possible for a visit in 1 week.   Specialty:  Internal Medicine   Contact information:   [address] [place] [address] [phone]        The results of significant diagnostics from this hospitalization (including imaging, microbiology, ancillary and laboratory) are listed below for reference.    Significant Diagnostic Studies: Dg Shoulder Right  11/05/2014   CLINICAL DATA:  Shoulder pain.  Rheumatoid arthritis.  EXAM: RIGHT SHOULDER - 2+ VIEW  COMPARISON:  05/23/2011  FINDINGS: Suboptimal study due to difficulty with patient positioning. Best obtainable exam shows no evidence of fracture or dislocation. No erosive changes. No significant joint narrowing or change from 2012.  IMPRESSION: No acute abnormality or change since 2012.   Electronically Signed   By: Jorje Guild M.D.   On: 11/05/2014 02:15   Ct Head Wo Contrast  11/05/2014   CLINICAL DATA:  Difficulty walking.  EXAM: CT HEAD WITHOUT CONTRAST  TECHNIQUE: Contiguous axial images were obtained from the base of the skull through the vertex without intravenous contrast.  COMPARISON:  None.  FINDINGS: Skull and Sinuses:No acute fracture or destructive process. Mild mucosal thickening in the inferior and posterior left maxillary sinus.  Orbits: No acute abnormality.  Brain: No evidence of acute infarction, hemorrhage, hydrocephalus, or mass lesion/mass effect.  There is generalized  brain volume loss which is typical for age and without lobar predilection. Mild-moderate chronic small vessel disease with ischemic gliosis best visualized around the lateral ventricles.  IMPRESSION: 1. No acute intracranial findings. 2. Mild to moderate chronic small vessel disease.   Electronically Signed   By: Jorje Guild M.D.   On: 11/05/2014 06:00    Microbiology: No results found for this or any previous visit (from the past 240 hour(s)).   Labs: Basic Metabolic Panel:  Recent Labs Lab 11/05/14 0012 11/05/14 0605 11/06/14 0430  NA 135* 137 136*  K 4.0 3.4* 3.9  CL 97 101 101  CO2 21 24 23   GLUCOSE 184* 119* 103*  BUN 13 12 10   CREATININE 0.89 0.81 0.85  CALCIUM 9.5 9.0 9.2   Liver Function Tests:  Recent Labs Lab 11/05/14 0012 11/05/14 0605  AST 26 75*  ALT 10 22  ALKPHOS 87 78  BILITOT 1.0 1.0  PROT 8.0 7.1  ALBUMIN 3.7 3.2*    Recent Labs Lab 11/05/14 0012  LIPASE 26   No results for input(s): AMMONIA in the last 168 hours. CBC:  Recent Labs Lab 11/05/14 0012 11/05/14 0605 11/05/14 0915 11/05/14 1321 11/06/14 0430  WBC 15.3* 13.0* 11.3* 11.3* 10.3  NEUTROABS 13.7* 9.5*  --   --   --   HGB 13.8 12.6* 12.1* 12.8* 12.3*  HCT 40.7 37.4* 36.6* 37.6* 37.7*  MCV 88.9 88.8 89.5 89.5 90.4  PLT 228 204 188 203 209   Cardiac Enzymes:  Recent Labs Lab 11/05/14 0605  CKTOTAL 3368*  TROPONINI <0.30   BNP: BNP (last 3 results) No results for input(s): PROBNP in the last 8760 hours. CBG:  Recent Labs Lab 11/05/14 1200 11/05/14 1616 11/05/14 2034 11/05/14 2323 11/06/14 0338  GLUCAP 109* 105* 154* 89 97   Signed:  Concepcion Gillott K  [facility] 11/06/2014, 11:43 AM

## 2014-11-09 DIAGNOSIS — F0390 Unspecified dementia without behavioral disturbance: Secondary | ICD-10-CM | POA: Diagnosis not present

## 2014-11-09 DIAGNOSIS — I5042 Chronic combined systolic (congestive) and diastolic (congestive) heart failure: Secondary | ICD-10-CM | POA: Diagnosis not present

## 2014-11-09 DIAGNOSIS — M6281 Muscle weakness (generalized): Secondary | ICD-10-CM | POA: Diagnosis not present

## 2014-11-09 DIAGNOSIS — N400 Enlarged prostate without lower urinary tract symptoms: Secondary | ICD-10-CM | POA: Diagnosis not present

## 2014-11-09 DIAGNOSIS — I2510 Atherosclerotic heart disease of native coronary artery without angina pectoris: Secondary | ICD-10-CM | POA: Diagnosis not present

## 2014-11-09 DIAGNOSIS — M069 Rheumatoid arthritis, unspecified: Secondary | ICD-10-CM | POA: Diagnosis not present

## 2014-11-13 DIAGNOSIS — K5010 Crohn's disease of large intestine without complications: Secondary | ICD-10-CM | POA: Diagnosis not present

## 2014-11-13 DIAGNOSIS — K920 Hematemesis: Secondary | ICD-10-CM | POA: Diagnosis not present

## 2014-11-13 DIAGNOSIS — K219 Gastro-esophageal reflux disease without esophagitis: Secondary | ICD-10-CM | POA: Diagnosis not present

## 2014-11-29 DIAGNOSIS — K219 Gastro-esophageal reflux disease without esophagitis: Secondary | ICD-10-CM | POA: Diagnosis not present

## 2014-11-29 DIAGNOSIS — K920 Hematemesis: Secondary | ICD-10-CM | POA: Diagnosis not present

## 2015-05-16 ENCOUNTER — Other Ambulatory Visit: Payer: Self-pay

## 2015-05-16 MED ORDER — CARVEDILOL 6.25 MG PO TABS: 6.2500 mg | ORAL_TABLET | Freq: Two times a day (BID) | ORAL | Status: DC

## 2015-05-27 NOTE — Progress Notes (Signed)
Cardiology Office [name]   Date:  05/28/2015   ID:  Anthony [name], DOB 1934/01/12, MRN [id number]  PCP:  Anthony Screws, [name]  Cardiologist:  Anthony Grooms, [name]   Chief Complaint  Patient presents with  . Coronary Artery Disease      History of Present Illness: Anthony [name] is a 81 y.o. male who presents for chronic systolic heart failure, coronary artery disease with prior anterolateral infarct due to occlusion of a large diagonal, hyperlipidemia, and hypertension. LVEF 45% by echo 2015.  Anthony [name] is doing well. He denies dyspnea. He has not had angina. No episodes of syncope.  Past Medical History  Diagnosis Date  . GERD (gastroesophageal reflux disease)   . Hyperlipidemia   . Heart attack 1996, 06/2011  . Coronary artery disease   . Hypertension   . Pneumonia 1993  . Sleep apnea     sleep study years ago- has apnea but did not qualify for CPAP  . Prostate troubles     states take alternating meds for bladder/prostate control- "age thing"  . Arthritis     rheumatoid-  Anthony [name]  . CHF (congestive heart failure)     chronic systolic heart failure per Anthony [name] 09/23/11.   EKG 4/12, stress test  and cath from 8/12 on chart.  Clearance with [name] Anthony [name] on chart  . Dysrhythmia     nonsustained,asymptomatic VT per Anthony [name]  resulting in cath    Past Surgical History  Procedure Laterality Date  . Wrist surgery Left 04/25/91  . Knee arthroscopy Right 1976  . Meniscus debridement Left 1996  . Trigger finger release Right 10/10/2009  . Cardiac catheterization      1996/ 2012 report on chart  . Umbilical hernia repair  2009  . Colonoscopy    . Inguinal hernia repair  11/04/2011    Procedure: HERNIA REPAIR INGUINAL ADULT;  Surgeon: Anthony [name];  Location: [facility];  Service: General;  Laterality: Right;  Right Inguinal Hernia Repair with Mesh     Current Outpatient Prescriptions  Medication Sig Dispense Refill  . Ascorbic Acid  (VITAMIN C) 1000 MG tablet Take 1,000 mg by mouth daily.     Marland Kitchen aspirin 325 MG tablet Take 325 mg by mouth every morning.     . beclomethasone (BECONASE-AQ) 42 MCG/SPRAY nasal spray Place 2 sprays into the nose 2 (two) times daily as needed. Allergies     . CALCIUM-VITAMIN D PO Take 1,000 mg by mouth daily.     . carvedilol (COREG) 6.25 MG tablet Take 1 tablet (6.25 mg total) by mouth 2 (two) times daily with a meal. 180 tablet 2  . donepezil (ARICEPT) 10 MG tablet Take 10 mg by mouth at bedtime.    . fish oil-omega-3 fatty acids 1000 MG capsule Take 1 g by mouth daily.     . folic acid (FOLVITE) 654 MCG tablet Take 400 mcg by mouth 2 (two) times daily.     Marland Kitchen ibuprofen (ADVIL,MOTRIN) 200 MG tablet Take 1 tablet (200 mg total) by mouth every 6 (six) hours as needed for moderate pain. 30 tablet 0  . mesalamine (LIALDA) 1.2 G EC tablet Take 2.4 g by mouth daily with breakfast.    . methotrexate (RHEUMATREX) 2.5 MG tablet Take 8 tablets by mouth Once a week. Wednesday    . nitroGLYCERIN (NITROSTAT) 0.4 MG SL tablet Place 0.4 mg under the tongue every 5 (five) minutes as  needed. Chest pain     . omeprazole (PRILOSEC) 20 MG capsule Take 20 mg by mouth daily.     . pravastatin (PRAVACHOL) 40 MG tablet Take 1 tablet (40 mg total) by mouth at bedtime. 90 tablet 3  . ranitidine (ZANTAC) 300 MG capsule Take 300 mg by mouth At bedtime.     . tamsulosin (FLOMAX) 0.4 MG CAPS capsule Take 1 capsule by mouth daily.  11  . traMADol (ULTRAM) 50 MG tablet Take 50 mg by mouth every 6 (six) hours as needed.  0  . Vitamins A & D (VITAMIN A & D) 10000-400 UNITS CAPS Take 1 tablet by mouth daily.      No current facility-administered medications for this visit.    Allergies:   Review of patient's allergies indicates no known allergies.    Social History:  The patient  reports that he quit smoking about 44 years ago. His smoking use included Cigarettes. He has a 60 pack-year smoking history. He has never used  smokeless tobacco. He reports that he does not drink alcohol or use illicit drugs.   Family History:  The patient's family history includes Emphysema in his father; Heart disease in his mother; Rheum arthritis in his mother.    ROS:  Please see the history of present illness.   Otherwise, review of systems are positive for decreased memory but otherwise okay.   All other systems are reviewed and negative.    PHYSICAL EXAM: VS:  BP 118/62 mmHg  Pulse 71  Ht 6' 1"  (1.854 m)  Wt 79.379 kg (175 lb)  BMI 23.09 kg/m2 , BMI Body mass index is 23.09 kg/(m^2). GEN: Well nourished, well developed, in no acute distress HEENT: normal Neck: no JVD, carotid bruits, or masses Cardiac: RRR; no murmurs, rubs, or gallops,no edema  Respiratory:  clear to auscultation bilaterally, normal work of breathing GI: soft, nontender, nondistended, + BS MS: no deformity or atrophy Skin: warm and dry, no rash Neuro:  Strength and sensation are intact Psych: euthymic mood, full affect   EKG:  EKG is ordered today. The ekg ordered today demonstrates normal sinus rhythm, left axis deviation, Q waves 1 and aVL. No change from prior.   Recent Labs: 11/05/2014: ALT 22; TSH 1.210 11/06/2014: BUN 10; Creatinine, Ser 0.85; Hemoglobin 12.3*; Platelets 209; Potassium 3.9; Sodium 136*    Lipid Panel No results found for: CHOL, TRIG, HDL, CHOLHDL, VLDL, LDLCALC, LDLDIRECT    Wt Readings from Last 3 Encounters:  05/28/15 79.379 kg (175 lb)  11/05/14 79.4 kg (175 lb 0.7 oz)  09/20/14 86.093 kg (189 lb 12.8 oz)      Other studies Reviewed: Additional studies/ records that were reviewed today include: . Review of the above records demonstrates:    ASSESSMENT AND PLAN:  1. Atherosclerosis of native coronary artery of native heart without angina pectoris Stable  2. Chronic combined systolic and diastolic heart failure No evidence of volume overload  3. Essential hypertension Controlled  4. HLD  (hyperlipidemia) Followed by primary care    Current medicines are reviewed at length with the patient today.  The patient does not have concerns regarding medicines.  The following changes have been made:  no change  Labs/ tests ordered today include:   Orders Placed This Encounter  Procedures  . EKG 12-Lead     Disposition:   FU with HS in 1 year  Signed, Anthony Grooms, [name]  05/28/2015 5:50 PM    [facility]  [place], [place], [place]  [address] Phone: [phone]; Fax: [phone]

## 2015-05-28 ENCOUNTER — Encounter: Payer: Self-pay | Admitting: *Deleted

## 2015-05-28 ENCOUNTER — Other Ambulatory Visit: Payer: Self-pay | Admitting: *Deleted

## 2015-05-28 ENCOUNTER — Ambulatory Visit (INDEPENDENT_AMBULATORY_CARE_PROVIDER_SITE_OTHER): Payer: PPO | Admitting: Interventional Cardiology

## 2015-05-28 VITALS — BP 118/62 | HR 71 | Ht 73.0 in | Wt 175.0 lb

## 2015-05-28 DIAGNOSIS — I10 Essential (primary) hypertension: Secondary | ICD-10-CM

## 2015-05-28 DIAGNOSIS — M5020 Other cervical disc displacement, unspecified cervical region: Secondary | ICD-10-CM | POA: Insufficient documentation

## 2015-05-28 DIAGNOSIS — I5042 Chronic combined systolic (congestive) and diastolic (congestive) heart failure: Secondary | ICD-10-CM

## 2015-05-28 DIAGNOSIS — I2510 Atherosclerotic heart disease of native coronary artery without angina pectoris: Secondary | ICD-10-CM

## 2015-05-28 DIAGNOSIS — I5022 Chronic systolic (congestive) heart failure: Secondary | ICD-10-CM | POA: Insufficient documentation

## 2015-05-28 DIAGNOSIS — R197 Diarrhea, unspecified: Secondary | ICD-10-CM | POA: Insufficient documentation

## 2015-05-28 DIAGNOSIS — R591 Generalized enlarged lymph nodes: Secondary | ICD-10-CM | POA: Insufficient documentation

## 2015-05-28 DIAGNOSIS — R634 Abnormal weight loss: Secondary | ICD-10-CM | POA: Insufficient documentation

## 2015-05-28 DIAGNOSIS — Q792 Exomphalos: Secondary | ICD-10-CM | POA: Insufficient documentation

## 2015-05-28 DIAGNOSIS — I252 Old myocardial infarction: Secondary | ICD-10-CM | POA: Insufficient documentation

## 2015-05-28 DIAGNOSIS — K209 Esophagitis, unspecified without bleeding: Secondary | ICD-10-CM | POA: Insufficient documentation

## 2015-05-28 DIAGNOSIS — I700 Atherosclerosis of aorta: Secondary | ICD-10-CM | POA: Insufficient documentation

## 2015-05-28 DIAGNOSIS — D649 Anemia, unspecified: Secondary | ICD-10-CM | POA: Insufficient documentation

## 2015-05-28 DIAGNOSIS — R05 Cough: Secondary | ICD-10-CM | POA: Insufficient documentation

## 2015-05-28 DIAGNOSIS — E785 Hyperlipidemia, unspecified: Secondary | ICD-10-CM | POA: Diagnosis not present

## 2015-05-28 DIAGNOSIS — R599 Enlarged lymph nodes, unspecified: Secondary | ICD-10-CM | POA: Insufficient documentation

## 2015-05-28 DIAGNOSIS — R55 Syncope and collapse: Secondary | ICD-10-CM | POA: Insufficient documentation

## 2015-05-28 DIAGNOSIS — E871 Hypo-osmolality and hyponatremia: Secondary | ICD-10-CM | POA: Insufficient documentation

## 2015-05-28 DIAGNOSIS — E7800 Pure hypercholesterolemia, unspecified: Secondary | ICD-10-CM | POA: Insufficient documentation

## 2015-05-28 DIAGNOSIS — K5010 Crohn's disease of large intestine without complications: Secondary | ICD-10-CM | POA: Insufficient documentation

## 2015-05-28 DIAGNOSIS — I4729 Other ventricular tachycardia: Secondary | ICD-10-CM | POA: Insufficient documentation

## 2015-05-28 DIAGNOSIS — J8410 Pulmonary fibrosis, unspecified: Secondary | ICD-10-CM | POA: Insufficient documentation

## 2015-05-28 DIAGNOSIS — R059 Cough, unspecified: Secondary | ICD-10-CM | POA: Insufficient documentation

## 2015-05-28 DIAGNOSIS — I472 Ventricular tachycardia: Secondary | ICD-10-CM | POA: Insufficient documentation

## 2015-05-28 DIAGNOSIS — IMO0001 Reserved for inherently not codable concepts without codable children: Secondary | ICD-10-CM | POA: Insufficient documentation

## 2015-05-28 DIAGNOSIS — R1031 Right lower quadrant pain: Secondary | ICD-10-CM | POA: Insufficient documentation

## 2015-05-28 MED ORDER — PRAVASTATIN SODIUM 40 MG PO TABS: 40.0000 mg | ORAL_TABLET | Freq: Every day | ORAL | Status: DC

## 2015-05-28 NOTE — Patient Instructions (Signed)
Medication Instructions:   Your physician recommends that you continue on your current medications as directed. Please refer to the Current Medication list given to you today.   Labwork:   Testing/Procedures:   Follow-Up:  Your physician wants you to follow-up in:  [place] will receive a reminder letter in the mail two months in advance. If you don't receive a letter, please call our office to schedule the follow-up appointment.   Any Other Special Instructions Will Be Listed Below (If Applicable).

## 2015-12-19 DIAGNOSIS — Z79899 Other long term (current) drug therapy: Secondary | ICD-10-CM | POA: Diagnosis not present

## 2015-12-19 DIAGNOSIS — M0579 Rheumatoid arthritis with rheumatoid factor of multiple sites without organ or systems involvement: Secondary | ICD-10-CM | POA: Diagnosis not present

## 2015-12-19 DIAGNOSIS — M150 Primary generalized (osteo)arthritis: Secondary | ICD-10-CM | POA: Diagnosis not present

## 2016-03-12 ENCOUNTER — Other Ambulatory Visit: Payer: PPO

## 2016-03-12 DIAGNOSIS — Z79899 Other long term (current) drug therapy: Secondary | ICD-10-CM | POA: Diagnosis not present

## 2016-03-12 DIAGNOSIS — M0579 Rheumatoid arthritis with rheumatoid factor of multiple sites without organ or systems involvement: Secondary | ICD-10-CM | POA: Diagnosis not present

## 2016-03-19 DIAGNOSIS — M0579 Rheumatoid arthritis with rheumatoid factor of multiple sites without organ or systems involvement: Secondary | ICD-10-CM | POA: Diagnosis not present

## 2016-03-19 DIAGNOSIS — M150 Primary generalized (osteo)arthritis: Secondary | ICD-10-CM | POA: Diagnosis not present

## 2016-03-19 DIAGNOSIS — Z79899 Other long term (current) drug therapy: Secondary | ICD-10-CM | POA: Diagnosis not present

## 2016-07-10 DIAGNOSIS — M150 Primary generalized (osteo)arthritis: Secondary | ICD-10-CM | POA: Diagnosis not present

## 2016-07-10 DIAGNOSIS — M0579 Rheumatoid arthritis with rheumatoid factor of multiple sites without organ or systems involvement: Secondary | ICD-10-CM | POA: Diagnosis not present

## 2016-07-10 DIAGNOSIS — Z79899 Other long term (current) drug therapy: Secondary | ICD-10-CM | POA: Diagnosis not present

## 2016-08-03 DIAGNOSIS — I10 Essential (primary) hypertension: Secondary | ICD-10-CM | POA: Diagnosis not present

## 2016-08-03 DIAGNOSIS — Z0000 Encounter for general adult medical examination without abnormal findings: Secondary | ICD-10-CM | POA: Diagnosis not present

## 2016-08-03 DIAGNOSIS — E7800 Pure hypercholesterolemia, unspecified: Secondary | ICD-10-CM | POA: Diagnosis not present

## 2016-08-03 DIAGNOSIS — I25119 Atherosclerotic heart disease of native coronary artery with unspecified angina pectoris: Secondary | ICD-10-CM | POA: Diagnosis not present

## 2016-08-03 DIAGNOSIS — F322 Major depressive disorder, single episode, severe without psychotic features: Secondary | ICD-10-CM | POA: Diagnosis not present

## 2016-08-03 DIAGNOSIS — K5010 Crohn's disease of large intestine without complications: Secondary | ICD-10-CM | POA: Diagnosis not present

## 2016-08-03 DIAGNOSIS — K219 Gastro-esophageal reflux disease without esophagitis: Secondary | ICD-10-CM | POA: Diagnosis not present

## 2016-08-03 DIAGNOSIS — I252 Old myocardial infarction: Secondary | ICD-10-CM | POA: Diagnosis not present

## 2016-08-03 DIAGNOSIS — B351 Tinea unguium: Secondary | ICD-10-CM | POA: Diagnosis not present

## 2016-08-03 DIAGNOSIS — Z79899 Other long term (current) drug therapy: Secondary | ICD-10-CM | POA: Diagnosis not present

## 2016-08-03 DIAGNOSIS — Z1389 Encounter for screening for other disorder: Secondary | ICD-10-CM | POA: Diagnosis not present

## 2016-08-03 DIAGNOSIS — E559 Vitamin D deficiency, unspecified: Secondary | ICD-10-CM | POA: Diagnosis not present

## 2016-08-03 DIAGNOSIS — M069 Rheumatoid arthritis, unspecified: Secondary | ICD-10-CM | POA: Diagnosis not present

## 2016-08-03 DIAGNOSIS — Z23 Encounter for immunization: Secondary | ICD-10-CM | POA: Diagnosis not present

## 2016-08-03 DIAGNOSIS — I5022 Chronic systolic (congestive) heart failure: Secondary | ICD-10-CM | POA: Diagnosis not present

## 2016-08-31 ENCOUNTER — Other Ambulatory Visit: Payer: Self-pay | Admitting: Interventional Cardiology

## 2016-09-07 ENCOUNTER — Encounter: Payer: Self-pay | Admitting: Nurse Practitioner

## 2016-09-07 ENCOUNTER — Other Ambulatory Visit: Payer: Self-pay | Admitting: *Deleted

## 2016-09-07 ENCOUNTER — Telehealth: Payer: Self-pay | Admitting: Interventional Cardiology

## 2016-09-07 ENCOUNTER — Ambulatory Visit (INDEPENDENT_AMBULATORY_CARE_PROVIDER_SITE_OTHER): Payer: PPO | Admitting: Nurse Practitioner

## 2016-09-07 ENCOUNTER — Encounter (INDEPENDENT_AMBULATORY_CARE_PROVIDER_SITE_OTHER): Payer: Self-pay

## 2016-09-07 VITALS — BP 122/80 | HR 107 | Ht 73.0 in | Wt 180.8 lb

## 2016-09-07 DIAGNOSIS — R000 Tachycardia, unspecified: Secondary | ICD-10-CM

## 2016-09-07 DIAGNOSIS — E7800 Pure hypercholesterolemia, unspecified: Secondary | ICD-10-CM

## 2016-09-07 DIAGNOSIS — I10 Essential (primary) hypertension: Secondary | ICD-10-CM | POA: Diagnosis not present

## 2016-09-07 DIAGNOSIS — I2510 Atherosclerotic heart disease of native coronary artery without angina pectoris: Secondary | ICD-10-CM | POA: Diagnosis not present

## 2016-09-07 DIAGNOSIS — I5042 Chronic combined systolic (congestive) and diastolic (congestive) heart failure: Secondary | ICD-10-CM

## 2016-09-07 LAB — CBC
HCT: 42.9 % (ref 38.5–50.0)
Hemoglobin: 14.6 g/dL (ref 13.2–17.1)
MCH: 30.9 pg (ref 27.0–33.0)
MCHC: 34 g/dL (ref 32.0–36.0)
MCV: 90.9 fL (ref 80.0–100.0)
MPV: 9 fL (ref 7.5–12.5)
Platelets: 251 10*3/uL (ref 140–400)
RBC: 4.72 MIL/uL (ref 4.20–5.80)
RDW: 14.9 % (ref 11.0–15.0)
WBC: 8.9 10*3/uL (ref 3.8–10.8)

## 2016-09-07 LAB — TSH: TSH: 0.76 mIU/L (ref 0.40–4.50)

## 2016-09-07 MED ORDER — CARVEDILOL 6.25 MG PO TABS: 6.2500 mg | ORAL_TABLET | Freq: Two times a day (BID) | ORAL | 2 refills | Status: DC

## 2016-09-07 MED ORDER — PRAVASTATIN SODIUM 40 MG PO TABS: 40.0000 mg | ORAL_TABLET | Freq: Every day | ORAL | 3 refills | Status: DC

## 2016-09-07 NOTE — Telephone Encounter (Signed)
New Message   *STAT* If patient is at the pharmacy, call can be transferred to refill team.   1. Which medications need to be refilled? (please list name of each medication and dose if known)  Pravastatin 40 mg tablet once daily at bedtime  2. Which pharmacy/location (including street and city if local pharmacy) is medication to be sent to? [facility] [phone], [place], [place]  3. Do they need a 30 day or 90 day supply?  30 day supply

## 2016-09-07 NOTE — Patient Instructions (Addendum)
We will be checking the following labs today - BMET, CBC, TSH, Lipids and HPF   Medication Instructions:    Continue with your current medicines.   Make sure this list of medicines matches up with what he is taking    Testing/Procedures To Be Arranged:  48 hour Holter  Follow-Up:   See back tentatively in 6 months - unless the monitor is abnormal.    Other Special Instructions:   N/A    If you need a refill on your cardiac medications before your next appointment, please call your pharmacy.   Call the [place] office at [phone] if you have any questions, problems or concerns.

## 2016-09-07 NOTE — Progress Notes (Signed)
CARDIOLOGY OFFICE NOTE  Date:  09/07/2016    Anthony [name] Date of Birth: 1934-03-05 Medical Record #[id number]  PCP:  [name], MD  Cardiologist:  [name]    Chief Complaint  Patient presents with  . Medication Refill    15 month check - seen for Dr. [name]    History of Present Illness: Anthony [name] is a 82 y.o. male who presents today for a follow up visit. Seen for Dr. [name].   He has a history of chronic systolic heart failure, coronary artery disease with prior anterolateral infarct due to occlusion of a large diagonal, hyperlipidemia, and hypertension. LVEF 45% by echo 2015.  He was last seen in July of 2016 - he was doing well.  Comes in today. Here with his daughter. She was involved in a motor vehicle accident on way here today. No injury noted. Labs typically checked by PCP - not really clear when he was last there - he answers "I don't remember" to several of my questions. Cardiac status ok. No chest pain. Breathing is stable. No syncope/dizziness. Tolerating his current medicines. Needing refills. Not really aware that his heart rate is a little fast today - wife typically gives him his medicines - she has a broken rib - may have missed some doses though according to the daughter. Daughter feels like he is doing ok - just getting more forgetful. Not really clear if he took his AM dose of Coreg today.   Past Medical History:  Diagnosis Date  . Arthritis    rheumatoid-  Dr Barkley Boards  . CHF (congestive heart failure) (HCC)    chronic systolic heart failure per Dr [name] [facility] note 09/23/11.   EKG 4/12, stress test  and cath from 8/12 on chart.  Clearance with [facility] Dr [name] on chart  . Coronary artery disease   . Dysrhythmia    nonsustained,asymptomatic VT per Dr [name] office note  resulting in cath  . GERD (gastroesophageal reflux disease)   . Heart attack 1996, 06/2011  . Hyperlipidemia   . Hypertension   . Pneumonia 1993  . Prostate troubles    states take alternating meds for bladder/prostate control- "age thing"  . Sleep apnea    sleep study years ago- has apnea but did not qualify for CPAP    Past Surgical History:  Procedure Laterality Date  . CARDIAC CATHETERIZATION     1996/ 2012 report on chart  . COLONOSCOPY    . INGUINAL HERNIA REPAIR  11/04/2011   Procedure: HERNIA REPAIR INGUINAL ADULT;  Surgeon: Pedro Earls, MD;  Location: [facility];  Service: General;  Laterality: Right;  Right Inguinal Hernia Repair with Mesh  . KNEE ARTHROSCOPY Right 1976  . MENISCUS DEBRIDEMENT Left 1996  . TRIGGER FINGER RELEASE Right 10/10/2009  . UMBILICAL HERNIA REPAIR  2009  . WRIST SURGERY Left 04/25/91     Medications: Current Outpatient Prescriptions  Medication Sig Dispense Refill  . Ascorbic Acid (VITAMIN C) 1000 MG tablet Take 1,000 mg by mouth daily.     Marland Kitchen aspirin 325 MG tablet Take 325 mg by mouth as needed.    . beclomethasone (BECONASE-AQ) 42 MCG/SPRAY nasal spray Place 2 sprays into the nose 2 (two) times daily as needed. Allergies     . CALCIUM-VITAMIN D PO Take 1,000 mg by mouth daily.     . carvedilol (COREG) 6.25 MG tablet Take 1 tablet (6.25 mg total) by mouth 2 (two) times daily with a  meal. 180 tablet 2  . donepezil (ARICEPT) 10 MG tablet Take 10 mg by mouth at bedtime.    . fish oil-omega-3 fatty acids 1000 MG capsule Take 1 g by mouth daily.     Marland Kitchen FLUoxetine (PROZAC) 20 MG capsule     . folic acid (FOLVITE) 412 MCG tablet Take 400 mcg by mouth 2 (two) times daily.     Marland Kitchen ibuprofen (ADVIL,MOTRIN) 200 MG tablet Take 1 tablet (200 mg total) by mouth every 6 (six) hours as needed for moderate pain. 30 tablet 0  . mesalamine (LIALDA) 1.2 G EC tablet Take 2.4 g by mouth daily with breakfast.    . methotrexate (RHEUMATREX) 2.5 MG tablet Take 8 tablets by mouth Once a week. Wednesday    . nitroGLYCERIN (NITROSTAT) 0.4 MG SL tablet Place 0.4 mg under the tongue every 5 (five) minutes as needed. Chest pain     . omeprazole  (PRILOSEC) 40 MG capsule Take 40 mg by mouth as needed.    . pravastatin (PRAVACHOL) 40 MG tablet Take 1 tablet (40 mg total) by mouth at bedtime. 90 tablet 3  . ranitidine (ZANTAC) 300 MG capsule Take 300 mg by mouth At bedtime.     . tamsulosin (FLOMAX) 0.4 MG CAPS capsule Take 1 capsule by mouth daily.  11  . traMADol (ULTRAM) 50 MG tablet Take 50 mg by mouth every 6 (six) hours as needed.  0  . Vitamins A & D (VITAMIN A & D) 10000-400 UNITS CAPS Take 1 tablet by mouth daily.      No current facility-administered medications for this visit.     Allergies: No Known Allergies  Social History: The patient  reports that he quit smoking about 45 years ago. His smoking use included Cigarettes. He has a 60.00 pack-year smoking history. He has never used smokeless tobacco. He reports that he does not drink alcohol or use drugs.   Family History: The patient's family history includes Emphysema in his father; Heart disease in his mother; Rheum arthritis in his mother.   Review of Systems: Please see the history of present illness.   Otherwise, the review of systems is positive for none.   All other systems are reviewed and negative.   Physical Exam: VS:  BP 122/80   Pulse (!) 107   Ht 6' 1"  (1.854 m)   Wt 180 lb 12.8 oz (82 kg)   BMI 23.85 kg/m  .  BMI Body mass index is 23.85 kg/m.  Wt Readings from Last 3 Encounters:  09/07/16 180 lb 12.8 oz (82 kg)  05/28/15 175 lb (79.4 kg)  11/05/14 175 lb 0.7 oz (79.4 kg)    General: Pleasant. Elderly male who is alert and in no acute distress.  He admits he has a memory issue. Weight is up 5 pounds since last visit here. HEENT: Normal.  Neck: Supple, no JVD, carotid bruits, or masses noted.  Cardiac: Regular rate and rhythm. His rate is faster on exam. ?S3 gallop. No edema.  Respiratory:  Lungs are clear to auscultation bilaterally with normal work of breathing.  GI: Soft and nontender.  MS: No deformity or atrophy. Gait and ROM intact.    Skin: Warm and dry. Color is normal.  Neuro:  Strength and sensation are intact and no gross focal deficits noted.  Psych: Alert, appropriate and with normal affect.   LABORATORY DATA:  EKG:  EKG is ordered today. This demonstrates sinus tach - rate today is 107.  Lab  Results  Component Value Date   WBC 10.3 11/06/2014   HGB 12.3 (L) 11/06/2014   HCT 37.7 (L) 11/06/2014   PLT 209 11/06/2014   GLUCOSE 103 (H) 11/06/2014   ALT 22 11/05/2014   AST 75 (H) 11/05/2014   NA 136 (L) 11/06/2014   K 3.9 11/06/2014   CL 101 11/06/2014   CREATININE 0.85 11/06/2014   BUN 10 11/06/2014   CO2 23 11/06/2014   TSH 1.210 11/05/2014   HGBA1C 5.6 11/05/2014    BNP (last 3 results) No results for input(s): BNP in the last 8760 hours.  ProBNP (last 3 results) No results for input(s): PROBNP in the last 8760 hours.   Other Studies Reviewed Today:  Echo Study Conclusions from 2015  - Left ventricle: The cavity size was normal. Wall thickness was normal. The estimated ejection fraction was 45%. Diffuse hypokinesis. Doppler parameters are consistent with abnormal left ventricular relaxation (grade 1 diastolic dysfunction). - Aortic valve: There was no stenosis. There was trivial regurgitation. - Mitral valve: Mildly calcified annulus. Mildly calcified leaflets . There was trivial regurgitation. - Right ventricle: The cavity size was normal. Systolic function was normal. - Tricuspid valve: There was trivial regurgitation. Peak RV-RA gradient (S): 21 mm Hg. - Pulmonary arteries: PA peak pressure: 24 mm Hg (S). - Inferior vena cava: The vessel was normal in size. The respirophasic diameter changes were in the normal range (>= 50%), consistent with normal central venous pressure.  Impressions:  - Technically difficult study with poor acoustic windows. Echo contrast would have been helpful. Normal LV size with probable global hypokinesis, EF estimated to be 45%.  Normal RV size and systolic function, no significant valvular abnormalities.   Notes Recorded by Sinclair Grooms, MD on 09/26/2014 at 1:38 PM Heart is stable with mild chronic reduction in systolic function unchanged over past 10 years   Assessment/Plan:  1. Atherosclerosis of native coronary artery of native heart without angina pectoris - he is doing well without symptoms.   2. Chronic combined systolic and diastolic heart failure No evidence of volume overload - last echo was unchanged - chronic low EF over the past 10 years.   3. Essential hypertension - BP ok on his current regimen.  4. HLD (hyperlipidemia) - followed by primary care - does not sound like he has had any recent labs - will check here today. Statin refilled.  5. Tachycardia - will review with Dr. [name] - ? If he is taking his Coreg correctly. Daughter will verify. Will place 48 hour Holter. Will need to address anticoagulation if this is AFL/fib.   6. Memory issues - on Aricept  Current medicines are reviewed with the patient today.  The patient does not have concerns regarding medicines other than what has been noted above.  The following changes have been made:  See above.  Labs/ tests ordered today include:    Orders Placed This Encounter  Procedures  . Basic metabolic panel  . Hepatic function panel  . Lipid panel  . CBC  . TSH  . Holter monitor - 48 hour  . EKG 12-Lead     Disposition:   FU with Dr. [name] in 6 months tentatively, unless Holter shows AFL/fib.   Patient is agreeable to this plan and will call if any problems develop in the interim.   Signed: Burtis Junes, RN, ANP-C 09/07/2016 4:23 PM  [facility] [address] [place], [place]  [address]  Phone: [phone] Fax: [phone]

## 2016-09-08 ENCOUNTER — Other Ambulatory Visit: Payer: Self-pay | Admitting: *Deleted

## 2016-09-08 LAB — BASIC METABOLIC PANEL
BUN: 8 mg/dL (ref 7–25)
CO2: 27 mmol/L (ref 20–31)
Calcium: 9.3 mg/dL (ref 8.6–10.3)
Chloride: 102 mmol/L (ref 98–110)
Creat: 1.1 mg/dL (ref 0.70–1.11)
Glucose, Bld: 88 mg/dL (ref 65–99)
Potassium: 3.7 mmol/L (ref 3.5–5.3)
Sodium: 140 mmol/L (ref 135–146)

## 2016-09-08 LAB — LIPID PANEL
Cholesterol: 170 mg/dL (ref 125–200)
HDL: 74 mg/dL (ref >=40)
LDL Cholesterol: 75 mg/dL (ref <130)
Total CHOL/HDL Ratio: 2.3 Ratio (ref <=5.0)
Triglycerides: 105 mg/dL (ref <150)
VLDL: 21 mg/dL (ref <30)

## 2016-09-08 LAB — HEPATIC FUNCTION PANEL
ALT: 22 U/L (ref 9–46)
AST: 25 U/L (ref 10–35)
Albumin: 4 g/dL (ref 3.6–5.1)
Alkaline Phosphatase: 70 U/L (ref 40–115)
Bilirubin, Direct: 0.2 mg/dL (ref <=0.2)
Indirect Bilirubin: 0.3 mg/dL (ref 0.2–1.2)
Total Bilirubin: 0.5 mg/dL (ref 0.2–1.2)
Total Protein: 6.8 g/dL (ref 6.1–8.1)

## 2016-09-17 ENCOUNTER — Ambulatory Visit (INDEPENDENT_AMBULATORY_CARE_PROVIDER_SITE_OTHER): Payer: PPO

## 2016-09-17 DIAGNOSIS — E0590 Thyrotoxicosis, unspecified without thyrotoxic crisis or storm: Secondary | ICD-10-CM | POA: Diagnosis not present

## 2016-09-17 DIAGNOSIS — E7800 Pure hypercholesterolemia, unspecified: Secondary | ICD-10-CM | POA: Diagnosis not present

## 2016-09-17 DIAGNOSIS — I5022 Chronic systolic (congestive) heart failure: Secondary | ICD-10-CM | POA: Diagnosis not present

## 2016-09-17 DIAGNOSIS — I10 Essential (primary) hypertension: Secondary | ICD-10-CM | POA: Diagnosis not present

## 2016-09-17 DIAGNOSIS — E559 Vitamin D deficiency, unspecified: Secondary | ICD-10-CM | POA: Diagnosis not present

## 2016-09-17 DIAGNOSIS — R000 Tachycardia, unspecified: Secondary | ICD-10-CM | POA: Diagnosis not present

## 2016-09-17 DIAGNOSIS — F322 Major depressive disorder, single episode, severe without psychotic features: Secondary | ICD-10-CM | POA: Diagnosis not present

## 2016-09-17 DIAGNOSIS — I25119 Atherosclerotic heart disease of native coronary artery with unspecified angina pectoris: Secondary | ICD-10-CM | POA: Diagnosis not present

## 2016-10-08 DIAGNOSIS — M150 Primary generalized (osteo)arthritis: Secondary | ICD-10-CM | POA: Diagnosis not present

## 2016-10-08 DIAGNOSIS — M0579 Rheumatoid arthritis with rheumatoid factor of multiple sites without organ or systems involvement: Secondary | ICD-10-CM | POA: Diagnosis not present

## 2016-10-08 DIAGNOSIS — Z79899 Other long term (current) drug therapy: Secondary | ICD-10-CM | POA: Diagnosis not present

## 2016-11-11 DIAGNOSIS — H2513 Age-related nuclear cataract, bilateral: Secondary | ICD-10-CM | POA: Diagnosis not present

## 2016-11-11 DIAGNOSIS — H25013 Cortical age-related cataract, bilateral: Secondary | ICD-10-CM | POA: Diagnosis not present

## 2016-11-11 DIAGNOSIS — H1859 Other hereditary corneal dystrophies: Secondary | ICD-10-CM | POA: Diagnosis not present

## 2016-11-11 DIAGNOSIS — H524 Presbyopia: Secondary | ICD-10-CM | POA: Diagnosis not present

## 2017-02-16 DIAGNOSIS — I25119 Atherosclerotic heart disease of native coronary artery with unspecified angina pectoris: Secondary | ICD-10-CM | POA: Diagnosis not present

## 2017-02-16 DIAGNOSIS — K521 Toxic gastroenteritis and colitis: Secondary | ICD-10-CM | POA: Diagnosis not present

## 2017-02-16 DIAGNOSIS — I10 Essential (primary) hypertension: Secondary | ICD-10-CM | POA: Diagnosis not present

## 2017-02-16 DIAGNOSIS — I5022 Chronic systolic (congestive) heart failure: Secondary | ICD-10-CM | POA: Diagnosis not present

## 2017-02-16 DIAGNOSIS — E0590 Thyrotoxicosis, unspecified without thyrotoxic crisis or storm: Secondary | ICD-10-CM | POA: Diagnosis not present

## 2017-02-16 DIAGNOSIS — E7800 Pure hypercholesterolemia, unspecified: Secondary | ICD-10-CM | POA: Diagnosis not present

## 2017-02-16 DIAGNOSIS — K5010 Crohn's disease of large intestine without complications: Secondary | ICD-10-CM | POA: Diagnosis not present

## 2017-02-16 DIAGNOSIS — E559 Vitamin D deficiency, unspecified: Secondary | ICD-10-CM | POA: Diagnosis not present

## 2017-02-16 DIAGNOSIS — F322 Major depressive disorder, single episode, severe without psychotic features: Secondary | ICD-10-CM | POA: Diagnosis not present

## 2017-04-01 DIAGNOSIS — M0579 Rheumatoid arthritis with rheumatoid factor of multiple sites without organ or systems involvement: Secondary | ICD-10-CM | POA: Diagnosis not present

## 2017-04-15 DIAGNOSIS — M0579 Rheumatoid arthritis with rheumatoid factor of multiple sites without organ or systems involvement: Secondary | ICD-10-CM | POA: Diagnosis not present

## 2017-04-15 DIAGNOSIS — Z79899 Other long term (current) drug therapy: Secondary | ICD-10-CM | POA: Diagnosis not present

## 2017-04-15 DIAGNOSIS — M150 Primary generalized (osteo)arthritis: Secondary | ICD-10-CM | POA: Diagnosis not present

## 2017-05-20 DIAGNOSIS — R197 Diarrhea, unspecified: Secondary | ICD-10-CM | POA: Diagnosis not present

## 2017-08-04 DIAGNOSIS — I5022 Chronic systolic (congestive) heart failure: Secondary | ICD-10-CM | POA: Diagnosis not present

## 2017-08-04 DIAGNOSIS — Z23 Encounter for immunization: Secondary | ICD-10-CM | POA: Diagnosis not present

## 2017-08-04 DIAGNOSIS — I25119 Atherosclerotic heart disease of native coronary artery with unspecified angina pectoris: Secondary | ICD-10-CM | POA: Diagnosis not present

## 2017-08-04 DIAGNOSIS — F322 Major depressive disorder, single episode, severe without psychotic features: Secondary | ICD-10-CM | POA: Diagnosis not present

## 2017-08-04 DIAGNOSIS — K521 Toxic gastroenteritis and colitis: Secondary | ICD-10-CM | POA: Diagnosis not present

## 2017-08-04 DIAGNOSIS — I10 Essential (primary) hypertension: Secondary | ICD-10-CM | POA: Diagnosis not present

## 2017-08-04 DIAGNOSIS — N400 Enlarged prostate without lower urinary tract symptoms: Secondary | ICD-10-CM | POA: Diagnosis not present

## 2017-08-04 DIAGNOSIS — E7800 Pure hypercholesterolemia, unspecified: Secondary | ICD-10-CM | POA: Diagnosis not present

## 2017-08-04 DIAGNOSIS — M069 Rheumatoid arthritis, unspecified: Secondary | ICD-10-CM | POA: Diagnosis not present

## 2017-08-04 DIAGNOSIS — Z0000 Encounter for general adult medical examination without abnormal findings: Secondary | ICD-10-CM | POA: Diagnosis not present

## 2017-08-04 DIAGNOSIS — E559 Vitamin D deficiency, unspecified: Secondary | ICD-10-CM | POA: Diagnosis not present

## 2017-08-04 DIAGNOSIS — K5010 Crohn's disease of large intestine without complications: Secondary | ICD-10-CM | POA: Diagnosis not present

## 2017-08-04 DIAGNOSIS — K219 Gastro-esophageal reflux disease without esophagitis: Secondary | ICD-10-CM | POA: Diagnosis not present

## 2017-08-04 DIAGNOSIS — Z79899 Other long term (current) drug therapy: Secondary | ICD-10-CM | POA: Diagnosis not present

## 2017-08-04 DIAGNOSIS — Z1389 Encounter for screening for other disorder: Secondary | ICD-10-CM | POA: Diagnosis not present

## 2017-08-10 DIAGNOSIS — K529 Noninfective gastroenteritis and colitis, unspecified: Secondary | ICD-10-CM | POA: Diagnosis not present

## 2017-08-10 DIAGNOSIS — R197 Diarrhea, unspecified: Secondary | ICD-10-CM | POA: Diagnosis not present

## 2017-08-19 DIAGNOSIS — M0579 Rheumatoid arthritis with rheumatoid factor of multiple sites without organ or systems involvement: Secondary | ICD-10-CM | POA: Diagnosis not present

## 2017-08-26 DIAGNOSIS — M0579 Rheumatoid arthritis with rheumatoid factor of multiple sites without organ or systems involvement: Secondary | ICD-10-CM | POA: Diagnosis not present

## 2017-08-26 DIAGNOSIS — Z79899 Other long term (current) drug therapy: Secondary | ICD-10-CM | POA: Diagnosis not present

## 2017-08-26 DIAGNOSIS — M150 Primary generalized (osteo)arthritis: Secondary | ICD-10-CM | POA: Diagnosis not present

## 2017-10-10 ENCOUNTER — Other Ambulatory Visit: Payer: Self-pay | Admitting: Nurse Practitioner

## 2017-10-10 DIAGNOSIS — E7800 Pure hypercholesterolemia, unspecified: Secondary | ICD-10-CM

## 2017-10-10 DIAGNOSIS — I5042 Chronic combined systolic (congestive) and diastolic (congestive) heart failure: Secondary | ICD-10-CM

## 2017-10-10 DIAGNOSIS — I2510 Atherosclerotic heart disease of native coronary artery without angina pectoris: Secondary | ICD-10-CM

## 2017-10-10 DIAGNOSIS — I10 Essential (primary) hypertension: Secondary | ICD-10-CM

## 2017-10-19 ENCOUNTER — Other Ambulatory Visit: Payer: Self-pay | Admitting: Nurse Practitioner

## 2017-10-19 DIAGNOSIS — I5022 Chronic systolic (congestive) heart failure: Secondary | ICD-10-CM | POA: Diagnosis not present

## 2017-10-19 DIAGNOSIS — F322 Major depressive disorder, single episode, severe without psychotic features: Secondary | ICD-10-CM | POA: Diagnosis not present

## 2017-10-19 DIAGNOSIS — I5042 Chronic combined systolic (congestive) and diastolic (congestive) heart failure: Secondary | ICD-10-CM

## 2017-10-19 DIAGNOSIS — I10 Essential (primary) hypertension: Secondary | ICD-10-CM | POA: Diagnosis not present

## 2017-10-19 DIAGNOSIS — I2510 Atherosclerotic heart disease of native coronary artery without angina pectoris: Secondary | ICD-10-CM

## 2017-10-19 DIAGNOSIS — I25119 Atherosclerotic heart disease of native coronary artery with unspecified angina pectoris: Secondary | ICD-10-CM | POA: Diagnosis not present

## 2017-10-19 DIAGNOSIS — Z79899 Other long term (current) drug therapy: Secondary | ICD-10-CM | POA: Diagnosis not present

## 2017-10-19 DIAGNOSIS — N400 Enlarged prostate without lower urinary tract symptoms: Secondary | ICD-10-CM | POA: Diagnosis not present

## 2017-10-19 DIAGNOSIS — E7800 Pure hypercholesterolemia, unspecified: Secondary | ICD-10-CM | POA: Diagnosis not present

## 2017-10-19 DIAGNOSIS — M069 Rheumatoid arthritis, unspecified: Secondary | ICD-10-CM | POA: Diagnosis not present

## 2017-10-19 DIAGNOSIS — K219 Gastro-esophageal reflux disease without esophagitis: Secondary | ICD-10-CM | POA: Diagnosis not present

## 2017-10-19 DIAGNOSIS — K529 Noninfective gastroenteritis and colitis, unspecified: Secondary | ICD-10-CM | POA: Diagnosis not present

## 2017-12-16 DIAGNOSIS — K529 Noninfective gastroenteritis and colitis, unspecified: Secondary | ICD-10-CM | POA: Diagnosis not present

## 2018-01-12 ENCOUNTER — Other Ambulatory Visit: Payer: Self-pay | Admitting: Nurse Practitioner

## 2018-01-12 DIAGNOSIS — I2510 Atherosclerotic heart disease of native coronary artery without angina pectoris: Secondary | ICD-10-CM

## 2018-01-12 DIAGNOSIS — I10 Essential (primary) hypertension: Secondary | ICD-10-CM

## 2018-01-12 DIAGNOSIS — E7800 Pure hypercholesterolemia, unspecified: Secondary | ICD-10-CM

## 2018-01-12 DIAGNOSIS — I5042 Chronic combined systolic (congestive) and diastolic (congestive) heart failure: Secondary | ICD-10-CM

## 2018-02-02 DIAGNOSIS — M069 Rheumatoid arthritis, unspecified: Secondary | ICD-10-CM | POA: Diagnosis not present

## 2018-02-02 DIAGNOSIS — K219 Gastro-esophageal reflux disease without esophagitis: Secondary | ICD-10-CM | POA: Diagnosis not present

## 2018-02-02 DIAGNOSIS — E7800 Pure hypercholesterolemia, unspecified: Secondary | ICD-10-CM | POA: Diagnosis not present

## 2018-02-02 DIAGNOSIS — I25119 Atherosclerotic heart disease of native coronary artery with unspecified angina pectoris: Secondary | ICD-10-CM | POA: Diagnosis not present

## 2018-02-02 DIAGNOSIS — I5022 Chronic systolic (congestive) heart failure: Secondary | ICD-10-CM | POA: Diagnosis not present

## 2018-02-02 DIAGNOSIS — N400 Enlarged prostate without lower urinary tract symptoms: Secondary | ICD-10-CM | POA: Diagnosis not present

## 2018-02-02 DIAGNOSIS — K529 Noninfective gastroenteritis and colitis, unspecified: Secondary | ICD-10-CM | POA: Diagnosis not present

## 2018-02-02 DIAGNOSIS — I10 Essential (primary) hypertension: Secondary | ICD-10-CM | POA: Diagnosis not present

## 2018-02-02 DIAGNOSIS — F322 Major depressive disorder, single episode, severe without psychotic features: Secondary | ICD-10-CM | POA: Diagnosis not present

## 2018-02-02 DIAGNOSIS — Z79899 Other long term (current) drug therapy: Secondary | ICD-10-CM | POA: Diagnosis not present

## 2018-02-08 ENCOUNTER — Other Ambulatory Visit: Payer: Self-pay | Admitting: Nurse Practitioner

## 2018-02-08 DIAGNOSIS — E7800 Pure hypercholesterolemia, unspecified: Secondary | ICD-10-CM

## 2018-02-08 DIAGNOSIS — I5042 Chronic combined systolic (congestive) and diastolic (congestive) heart failure: Secondary | ICD-10-CM

## 2018-02-08 DIAGNOSIS — I10 Essential (primary) hypertension: Secondary | ICD-10-CM

## 2018-02-08 DIAGNOSIS — I2510 Atherosclerotic heart disease of native coronary artery without angina pectoris: Secondary | ICD-10-CM

## 2018-02-19 ENCOUNTER — Other Ambulatory Visit: Payer: Self-pay | Admitting: Nurse Practitioner

## 2018-02-19 DIAGNOSIS — I10 Essential (primary) hypertension: Secondary | ICD-10-CM

## 2018-02-19 DIAGNOSIS — I5042 Chronic combined systolic (congestive) and diastolic (congestive) heart failure: Secondary | ICD-10-CM

## 2018-02-19 DIAGNOSIS — E7800 Pure hypercholesterolemia, unspecified: Secondary | ICD-10-CM

## 2018-02-19 DIAGNOSIS — I2510 Atherosclerotic heart disease of native coronary artery without angina pectoris: Secondary | ICD-10-CM

## 2018-02-22 ENCOUNTER — Encounter: Payer: Self-pay | Admitting: Nurse Practitioner

## 2018-02-22 ENCOUNTER — Ambulatory Visit: Payer: PPO | Admitting: Nurse Practitioner

## 2018-02-22 DIAGNOSIS — I10 Essential (primary) hypertension: Secondary | ICD-10-CM

## 2018-02-22 DIAGNOSIS — I5042 Chronic combined systolic (congestive) and diastolic (congestive) heart failure: Secondary | ICD-10-CM | POA: Diagnosis not present

## 2018-02-22 DIAGNOSIS — I2510 Atherosclerotic heart disease of native coronary artery without angina pectoris: Secondary | ICD-10-CM

## 2018-02-22 DIAGNOSIS — E7800 Pure hypercholesterolemia, unspecified: Secondary | ICD-10-CM | POA: Diagnosis not present

## 2018-02-22 MED ORDER — CARVEDILOL 6.25 MG PO TABS: ORAL_TABLET | ORAL | 3 refills | Status: DC
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
## 2018-02-22 NOTE — Patient Instructions (Addendum)
We will be checking the following labs today - NONE   Medication Instructions:    Continue with your current medicines.   I sent in your refill for Coreg today    Testing/Procedures To Be Arranged:  N/A  Follow-Up:   See me in one year.     Other Special Instructions:   N/A    If you need a refill on your cardiac medications before your next appointment, please call your pharmacy.   Call the [place] office at [phone] if you have any questions, problems or concerns.

## 2018-03-05 ENCOUNTER — Other Ambulatory Visit: Payer: Self-pay | Admitting: Nurse Practitioner

## 2018-03-05 DIAGNOSIS — E7800 Pure hypercholesterolemia, unspecified: Secondary | ICD-10-CM

## 2018-03-05 DIAGNOSIS — I10 Essential (primary) hypertension: Secondary | ICD-10-CM

## 2018-03-05 DIAGNOSIS — I2510 Atherosclerotic heart disease of native coronary artery without angina pectoris: Secondary | ICD-10-CM

## 2018-03-05 DIAGNOSIS — I5042 Chronic combined systolic (congestive) and diastolic (congestive) heart failure: Secondary | ICD-10-CM

## 2018-04-03 DIAGNOSIS — R402441 Other coma, without documented Glasgow coma scale score, or with partial score reported, in the field [EMT or ambulance]: Secondary | ICD-10-CM | POA: Diagnosis not present

## 2018-04-04 ENCOUNTER — Emergency Department (HOSPITAL_COMMUNITY): Payer: PPO

## 2018-04-04 ENCOUNTER — Other Ambulatory Visit: Payer: Self-pay

## 2018-04-04 ENCOUNTER — Inpatient Hospital Stay (HOSPITAL_COMMUNITY)
Admission: EM | Admit: 2018-04-04 | Discharge: 2018-04-07 | DRG: 439 | Disposition: A | Discharge status: Home Health | Payer: PPO | Attending: Internal Medicine | Admitting: Internal Medicine

## 2018-04-04 DIAGNOSIS — E860 Dehydration: Secondary | ICD-10-CM | POA: Diagnosis not present

## 2018-04-04 DIAGNOSIS — K859 Acute pancreatitis without necrosis or infection, unspecified: Secondary | ICD-10-CM | POA: Diagnosis present

## 2018-04-04 DIAGNOSIS — I10 Essential (primary) hypertension: Secondary | ICD-10-CM | POA: Diagnosis present

## 2018-04-04 DIAGNOSIS — R112 Nausea with vomiting, unspecified: Secondary | ICD-10-CM | POA: Diagnosis not present

## 2018-04-04 DIAGNOSIS — R21 Rash and other nonspecific skin eruption: Secondary | ICD-10-CM | POA: Diagnosis not present

## 2018-04-04 DIAGNOSIS — K219 Gastro-esophageal reflux disease without esophagitis: Secondary | ICD-10-CM | POA: Diagnosis present

## 2018-04-04 DIAGNOSIS — Z8249 Family history of ischemic heart disease and other diseases of the circulatory system: Secondary | ICD-10-CM | POA: Diagnosis not present

## 2018-04-04 DIAGNOSIS — K8500 Idiopathic acute pancreatitis without necrosis or infection: Secondary | ICD-10-CM | POA: Diagnosis not present

## 2018-04-04 DIAGNOSIS — I110 Hypertensive heart disease with heart failure: Secondary | ICD-10-CM | POA: Diagnosis not present

## 2018-04-04 DIAGNOSIS — I5022 Chronic systolic (congestive) heart failure: Secondary | ICD-10-CM | POA: Diagnosis not present

## 2018-04-04 DIAGNOSIS — Z66 Do not resuscitate: Secondary | ICD-10-CM | POA: Diagnosis not present

## 2018-04-04 DIAGNOSIS — R531 Weakness: Secondary | ICD-10-CM

## 2018-04-04 DIAGNOSIS — E871 Hypo-osmolality and hyponatremia: Secondary | ICD-10-CM | POA: Diagnosis not present

## 2018-04-04 DIAGNOSIS — R748 Abnormal levels of other serum enzymes: Secondary | ICD-10-CM | POA: Diagnosis not present

## 2018-04-04 DIAGNOSIS — F0280 Dementia in other diseases classified elsewhere without behavioral disturbance: Secondary | ICD-10-CM | POA: Diagnosis not present

## 2018-04-04 DIAGNOSIS — Z87891 Personal history of nicotine dependence: Secondary | ICD-10-CM

## 2018-04-04 DIAGNOSIS — I2510 Atherosclerotic heart disease of native coronary artery without angina pectoris: Secondary | ICD-10-CM | POA: Diagnosis present

## 2018-04-04 DIAGNOSIS — N400 Enlarged prostate without lower urinary tract symptoms: Secondary | ICD-10-CM | POA: Diagnosis present

## 2018-04-04 DIAGNOSIS — K5010 Crohn's disease of large intestine without complications: Secondary | ICD-10-CM | POA: Diagnosis present

## 2018-04-04 DIAGNOSIS — Z8261 Family history of arthritis: Secondary | ICD-10-CM | POA: Diagnosis not present

## 2018-04-04 DIAGNOSIS — K760 Fatty (change of) liver, not elsewhere classified: Secondary | ICD-10-CM

## 2018-04-04 DIAGNOSIS — M069 Rheumatoid arthritis, unspecified: Secondary | ICD-10-CM | POA: Diagnosis not present

## 2018-04-04 DIAGNOSIS — R0902 Hypoxemia: Secondary | ICD-10-CM | POA: Diagnosis not present

## 2018-04-04 DIAGNOSIS — E785 Hyperlipidemia, unspecified: Secondary | ICD-10-CM | POA: Diagnosis present

## 2018-04-04 DIAGNOSIS — R1110 Vomiting, unspecified: Secondary | ICD-10-CM | POA: Diagnosis not present

## 2018-04-04 DIAGNOSIS — Z825 Family history of asthma and other chronic lower respiratory diseases: Secondary | ICD-10-CM | POA: Diagnosis not present

## 2018-04-04 DIAGNOSIS — G309 Alzheimer's disease, unspecified: Secondary | ICD-10-CM | POA: Diagnosis present

## 2018-04-04 LAB — URINALYSIS, ROUTINE W REFLEX MICROSCOPIC
BACTERIA UA: NONE SEEN
BILIRUBIN URINE: NEGATIVE
Glucose, UA: NEGATIVE mg/dL
Ketones, ur: NEGATIVE mg/dL
LEUKOCYTES UA: NEGATIVE
NITRITE: NEGATIVE
PH: 6 (ref 5.0–8.0)
Protein, ur: NEGATIVE mg/dL
SPECIFIC GRAVITY, URINE: 1.013 (ref 1.005–1.030)

## 2018-04-04 LAB — CBC
HCT: 40.8 % (ref 39.0–52.0)
HEMOGLOBIN: 14.4 g/dL (ref 13.0–17.0)
MCH: 31.9 pg (ref 26.0–34.0)
MCHC: 35.3 g/dL (ref 30.0–36.0)
MCV: 90.5 fL (ref 78.0–100.0)
Platelets: 275 10*3/uL (ref 150–400)
RBC: 4.51 MIL/uL (ref 4.22–5.81)
RDW: 15.3 % (ref 11.5–15.5)
WBC: 11.5 10*3/uL — ABNORMAL HIGH (ref 4.0–10.5)

## 2018-04-04 LAB — COMPREHENSIVE METABOLIC PANEL
ALBUMIN: 3.3 g/dL — AB (ref 3.5–5.0)
ALT: 22 U/L (ref 17–63)
AST: 27 U/L (ref 15–41)
Alkaline Phosphatase: 84 U/L (ref 38–126)
Anion gap: 10 (ref 5–15)
BUN: 10 mg/dL (ref 6–20)
CHLORIDE: 98 mmol/L — AB (ref 101–111)
CO2: 23 mmol/L (ref 22–32)
Calcium: 8.6 mg/dL — ABNORMAL LOW (ref 8.9–10.3)
Creatinine, Ser: 0.73 mg/dL (ref 0.61–1.24)
GFR calc Af Amer: >60 mL/min (ref >60)
GFR calc non Af Amer: >60 mL/min (ref >60)
GLUCOSE: 128 mg/dL — AB (ref 65–99)
Potassium: 4.1 mmol/L (ref 3.5–5.1)
SODIUM: 131 mmol/L — AB (ref 135–145)
Total Bilirubin: 2.1 mg/dL — ABNORMAL HIGH (ref 0.3–1.2)
Total Protein: 7.4 g/dL (ref 6.5–8.1)

## 2018-04-04 LAB — I-STAT TROPONIN, ED: TROPONIN I, POC: 0 ng/mL (ref 0.00–0.08)

## 2018-04-04 LAB — LIPASE, BLOOD: LIPASE: 163 U/L — AB (ref 11–51)

## 2018-04-04 MED ORDER — IOPAMIDOL (ISOVUE-300) INJECTION 61%
INTRAVENOUS | Status: AC
  Filled 2018-04-04: qty 100

## 2018-04-04 MED ORDER — ACETAMINOPHEN 325 MG PO TABS: 650.0000 mg | ORAL_TABLET | Freq: Four times a day (QID) | ORAL | Status: DC | PRN

## 2018-04-04 MED ORDER — SENNA 8.6 MG PO TABS
1.0000 | ORAL_TABLET | Freq: Two times a day (BID) | ORAL | Status: DC
  Administered 2018-04-05 – 2018-04-07 (×5): 8.6 mg via ORAL
  Filled 2018-04-04 (×6): qty 1

## 2018-04-04 MED ORDER — PRAVASTATIN SODIUM 40 MG PO TABS
40.0000 mg | ORAL_TABLET | Freq: Every day | ORAL | Status: DC
Start: 2018-04-04 — End: 2018-04-07
  Administered 2018-04-05 – 2018-04-06 (×3): 40 mg via ORAL
  Filled 2018-04-04 (×2): qty 1
  Filled 2018-04-04: qty 2

## 2018-04-04 MED ORDER — ONDANSETRON HCL 4 MG/2ML IJ SOLN: 4.0000 mg | Freq: Four times a day (QID) | INTRAMUSCULAR | Status: DC | PRN

## 2018-04-04 MED ORDER — ENOXAPARIN SODIUM 40 MG/0.4ML ~~LOC~~ SOLN
40.0000 mg | SUBCUTANEOUS | Status: DC
  Administered 2018-04-05 – 2018-04-07 (×3): 40 mg via SUBCUTANEOUS
  Filled 2018-04-04 (×3): qty 0.4

## 2018-04-04 MED ORDER — DONEPEZIL HCL 10 MG PO TABS
10.0000 mg | ORAL_TABLET | Freq: Every day | ORAL | Status: DC
  Administered 2018-04-05 – 2018-04-06 (×3): 10 mg via ORAL
  Filled 2018-04-04: qty 1
  Filled 2018-04-04: qty 2
  Filled 2018-04-04: qty 1

## 2018-04-04 MED ORDER — ACETAMINOPHEN 650 MG RE SUPP: 650.0000 mg | Freq: Four times a day (QID) | RECTAL | Status: DC | PRN

## 2018-04-04 MED ORDER — IOPAMIDOL (ISOVUE-300) INJECTION 61%
100.0000 mL | Freq: Once | INTRAVENOUS | Status: AC | PRN
  Administered 2018-04-04: 100 mL via INTRAVENOUS

## 2018-04-04 MED ORDER — MESALAMINE 1.2 G PO TBEC
4.8000 g | DELAYED_RELEASE_TABLET | Freq: Every day | ORAL | Status: DC
  Administered 2018-04-05 – 2018-04-07 (×3): 4.8 g via ORAL
  Filled 2018-04-04 (×3): qty 4

## 2018-04-04 MED ORDER — FLUOXETINE HCL 20 MG PO CAPS
20.0000 mg | ORAL_CAPSULE | Freq: Every day | ORAL | Status: DC
  Administered 2018-04-05 – 2018-04-07 (×3): 20 mg via ORAL
  Filled 2018-04-04 (×3): qty 1

## 2018-04-04 MED ORDER — DOCUSATE SODIUM 100 MG PO CAPS
100.0000 mg | ORAL_CAPSULE | Freq: Two times a day (BID) | ORAL | Status: DC
  Administered 2018-04-05 – 2018-04-07 (×5): 100 mg via ORAL
  Filled 2018-04-04 (×6): qty 1

## 2018-04-04 MED ORDER — LACTATED RINGERS IV SOLN
INTRAVENOUS | Status: AC
  Administered 2018-04-05: via INTRAVENOUS

## 2018-04-04 MED ORDER — FAMOTIDINE 20 MG PO TABS
20.0000 mg | ORAL_TABLET | Freq: Every day | ORAL | Status: DC
  Administered 2018-04-05 – 2018-04-06 (×3): 20 mg via ORAL
  Filled 2018-04-04 (×3): qty 1

## 2018-04-04 MED ORDER — TAMSULOSIN HCL 0.4 MG PO CAPS
0.4000 mg | ORAL_CAPSULE | Freq: Every day | ORAL | Status: DC
  Administered 2018-04-05 – 2018-04-07 (×3): 0.4 mg via ORAL
  Filled 2018-04-04 (×3): qty 1

## 2018-04-04 MED ORDER — OXYCODONE HCL 5 MG PO TABS
5.0000 mg | ORAL_TABLET | ORAL | Status: DC | PRN
  Administered 2018-04-06: 5 mg via ORAL
  Filled 2018-04-04: qty 1

## 2018-04-04 MED ORDER — ONDANSETRON HCL 4 MG PO TABS: 4.0000 mg | ORAL_TABLET | Freq: Four times a day (QID) | ORAL | Status: DC | PRN

## 2018-04-04 MED ORDER — POLYETHYLENE GLYCOL 3350 17 G PO PACK: 17.0000 g | PACK | Freq: Every day | ORAL | Status: DC | PRN

## 2018-04-04 MED ORDER — SODIUM CHLORIDE 0.9 % IV BOLUS
500.0000 mL | Freq: Once | INTRAVENOUS | Status: AC
  Administered 2018-04-04: 500 mL via INTRAVENOUS

## 2018-04-04 MED ORDER — CARVEDILOL 6.25 MG PO TABS
6.2500 mg | ORAL_TABLET | Freq: Two times a day (BID) | ORAL | Status: DC
  Administered 2018-04-05 – 2018-04-07 (×4): 6.25 mg via ORAL
  Filled 2018-04-04 (×5): qty 1

## 2018-04-04 NOTE — H&P (Addendum)
History and Physical    IZEKIEL FLEGEL WLN:[id number] DOB: 06/14/1934 DOA: 04/04/2018  PCP: Josetta Huddle, MD Patient coming from: [place]  I have personally briefly reviewed patient's old medical records in [place]  Chief Complaint: Nausea, vomiting, abdominal pain  HPI: Anthony [name] is a 84 y.o. male with medical history significant for HFrEF (EF 45% in 2015), CAD, Crohn's disease on mesalamine, RA on methotrexate, Alzheimer's dementia, HTN and BPH who presented with proximally 1 week of progressive nausea, vomiting and abdominal pain.  He reports that his symptoms have been worsened with eating greasy foods.  Pain is also worsened with rising from supine to seated position, as well as with ambulation.  He denies similar symptoms in the past.  He endorses to 3 episodes of loose stool over similar period of time, he reports that his baseline is generally more constipated.  He denies alcohol consumption.  No new medications.  No recent travel.  No clear food indiscretions.  He reports adherence with his home medication regimen.  Denies fever, chills, myalgias, arthralgias, chest pain, shortness of breath.  Patient resides at home with his wife who is chronically ill.  They have 2 daughters who live nearby and assist with IADLs.  ED Course: In the ED, pt afebrile, hemodynamically intact. Labs notable for WBC 11.5, Hgb 14.4, platelets 275, Na 131, K 4.1, BUN 10, Cr 0.73, glucose 128, TBili 2.1, AST/ALT wnl, lipase 163. U/A negative for leuks, nitrites. CT a/p showed no acute findings. RUQ U/S showed no gallstones, cholecystic wall thickening or CBD dilation. Pt given 500 cc NS while in ED. Pt asking for resumption of diet.  Review of Systems: As per HPI otherwise 10 point review of systems negative.   Past Medical History:  Diagnosis Date  . Arthritis    rheumatoid-  Dr Barkley Boards  . CHF (congestive heart failure) (HCC)    chronic systolic heart failure per Dr [name] LOV note 09/23/11.    EKG 4/12, stress test  and cath from 8/12 on chart.  Clearance with LOV Dr [name] on chart  . Coronary artery disease   . Dysrhythmia    nonsustained,asymptomatic VT per Dr [name] office note  resulting in cath  . GERD (gastroesophageal reflux disease)   . Heart attack ([place]) 1996, 06/2011  . Hyperlipidemia   . Hypertension   . Pneumonia 1993  . Prostate troubles    states take alternating meds for bladder/prostate control- "age thing"  . Sleep apnea    sleep study years ago- has apnea but did not qualify for CPAP    Past Surgical History:  Procedure Laterality Date  . CARDIAC CATHETERIZATION     1996/ 2012 report on chart  . COLONOSCOPY    . INGUINAL HERNIA REPAIR  11/04/2011   Procedure: HERNIA REPAIR INGUINAL ADULT;  Surgeon: Pedro Earls, MD;  Location: [facility];  Service: General;  Laterality: Right;  Right Inguinal Hernia Repair with Mesh  . KNEE ARTHROSCOPY Right 1976  . MENISCUS DEBRIDEMENT Left 1996  . TRIGGER FINGER RELEASE Right 10/10/2009  . UMBILICAL HERNIA REPAIR  2009  . WRIST SURGERY Left 04/25/91     reports that he quit smoking about 47 years ago. His smoking use included cigarettes. He has a 60.00 pack-year smoking history. He has never used smokeless tobacco. He reports that he does not drink alcohol or use drugs.  No Known Allergies  Family History  Problem Relation Age of Onset  . Heart disease Mother   .  Rheum arthritis Mother   . Emphysema Father     Prior to Admission medications   Medication Sig Start Date End Date Taking? Authorizing Provider  CALCIUM-VITAMIN D PO Take 1,000 mg by mouth daily.    Yes [provider]  carvedilol (COREG) 6.25 MG tablet TAKE 1 TABLET TWICE A DAY WITH A MEAL. 02/22/18  Yes Burtis Junes, NP  Coenzyme Q10 (CO Q-10) 100 MG CAPS Take 100 mg by mouth daily.   Yes [provider]  donepezil (ARICEPT) 10 MG tablet Take 10 mg by mouth at bedtime.   Yes [provider]  fish oil-omega-3 fatty acids  1000 MG capsule Take 1 g by mouth daily.    Yes [provider]  FLUoxetine (PROZAC) 20 MG capsule Take 20 mg by mouth daily.  09/06/16  Yes [provider]  folic acid (FOLVITE) 784 MCG tablet Take 400 mcg by mouth 2 (two) times daily.    Yes [provider]  magnesium oxide (MAG-OX) 400 MG tablet Take 400 mg by mouth daily.   Yes [provider]  mesalamine (LIALDA) 1.2 G EC tablet Take 4.8 g by mouth daily with breakfast.    Yes [provider]  pravastatin (PRAVACHOL) 40 MG tablet Take 1 tablet (40 mg total) by mouth at bedtime. 03/08/18  Yes Belva Crome, MD  ranitidine (ZANTAC) 300 MG capsule Take 300 mg by mouth At bedtime.  07/30/11  Yes [provider]  tamsulosin (FLOMAX) 0.4 MG CAPS capsule Take 1 capsule by mouth daily. 10/01/14  Yes [provider]  Vitamins A & D (VITAMIN A & D) 10000-400 UNITS CAPS Take 1 tablet by mouth daily.    Yes [provider]  methotrexate (RHEUMATREX) 2.5 MG tablet Take 8 tablets by mouth Once a week. Wednesday Take 8 tablets=20 mg. 07/02/11   [provider]  nitroGLYCERIN (NITROSTAT) 0.4 MG SL tablet Place 0.4 mg under the tongue every 5 (five) minutes as needed. Chest pain     [provider]  traMADol (ULTRAM) 50 MG tablet Take 50 mg by mouth every 6 (six) hours as needed for moderate pain or severe pain.  04/16/15   [provider]    Physical Exam: Vitals:   04/04/18 2015 04/04/18 2024 04/04/18 2030 04/04/18 2210  BP: 112/71 138/65 109/66 132/73  Pulse: 75 70 74 74  Resp: 12 18 14 17   Temp:      TempSrc:      SpO2: 100% 98% 96% 96%  Weight:      Height:        Constitutional: NAD, calm, comfortable Eyes: PERRL, lids and conjunctivae normal ENMT: Mucous membranes are moist. Posterior pharynx clear of any exudate or lesions.  Neck: normal, supple, no masses Respiratory: clear to auscultation bilaterally, no wheezing, no crackles. Normal respiratory  effort. Cardiovascular: Regular rate and rhythm, no murmurs / rubs / gallops. No extremity edema. 2+ pedal pulses. Abdomen: no tenderness, no masses palpated. Bowel sounds positive.  Musculoskeletal: no clubbing / cyanosis. No joint deformity upper and lower extremities. Good ROM, no contractures. Normal muscle tone.  Skin: no rashes, lesions, ulcers. No induration Neurologic: CN 2-12 grossly intact. Sensation intact. Strength 5/5 in all 4.  Psychiatric: Normal judgment and insight. Alert and oriented. Normal mood.   Labs on Admission: I have personally reviewed following labs and imaging studies  CBC: Recent Labs  Lab 04/04/18 1605  WBC 11.5*  HGB 14.4  HCT 40.8  MCV 90.5  PLT 503   Basic Metabolic Panel: Recent Labs  Lab 04/04/18 1605  NA 131*  K 4.1  CL 98*  CO2 23  GLUCOSE 128*  BUN 10  CREATININE 0.73  CALCIUM 8.6*   GFR: Estimated Creatinine Clearance: 78.1 mL/min (by C-G formula based on SCr of 0.73 mg/dL). Liver Function Tests: Recent Labs  Lab 04/04/18 1605  AST 27  ALT 22  ALKPHOS 84  BILITOT 2.1*  PROT 7.4  ALBUMIN 3.3*   Recent Labs  Lab 04/04/18 1605  LIPASE 163*   No results for input(s): AMMONIA in the last 168 hours. Coagulation Profile: No results for input(s): INR, PROTIME in the last 168 hours. Cardiac Enzymes: No results for input(s): CKTOTAL, CKMB, CKMBINDEX, TROPONINI in the last 168 hours. BNP (last 3 results) No results for input(s): PROBNP in the last 8760 hours. HbA1C: No results for input(s): HGBA1C in the last 72 hours. CBG: No results for input(s): GLUCAP in the last 168 hours. Lipid Profile: No results for input(s): CHOL, HDL, LDLCALC, TRIG, CHOLHDL, LDLDIRECT in the last 72 hours. Thyroid Function Tests: No results for input(s): TSH, T4TOTAL, FREET4, T3FREE, THYROIDAB in the last 72 hours. Anemia Panel: No results for input(s): VITAMINB12, FOLATE, FERRITIN, TIBC, IRON, RETICCTPCT in the last 72 hours. Urine analysis:     Component Value Date/Time   COLORURINE YELLOW 04/04/2018 1543   APPEARANCEUR CLEAR 04/04/2018 1543   LABSPEC 1.013 04/04/2018 1543   PHURINE 6.0 04/04/2018 1543   GLUCOSEU NEGATIVE 04/04/2018 1543   HGBUR SMALL (A) 04/04/2018 1543   BILIRUBINUR NEGATIVE 04/04/2018 1543   KETONESUR NEGATIVE 04/04/2018 1543   PROTEINUR NEGATIVE 04/04/2018 1543   UROBILINOGEN 0.2 11/05/2014 0303   NITRITE NEGATIVE 04/04/2018 1543   LEUKOCYTESUR NEGATIVE 04/04/2018 1543    Radiological Exams on Admission: Ct Abdomen Pelvis W Contrast  Result Date: 04/04/2018 CLINICAL DATA:  Nausea and vomiting for the past 2 days. History of dementia. EXAM: CT ABDOMEN AND PELVIS WITH CONTRAST TECHNIQUE: Multidetector CT imaging of the abdomen and pelvis was performed using the standard protocol following bolus administration of intravenous contrast. CONTRAST:  113m ISOVUE-300 IOPAMIDOL (ISOVUE-300) INJECTION 61% COMPARISON:  CT abdomen dated 07/03/2011. FINDINGS: Lower chest: Bibasilar atelectasis and/or fibrosis. Hepatobiliary: Gallbladder is mildly distended but otherwise unremarkable. No focal abnormality within the liver. No bile duct dilatation seen. Pancreas: Partially infiltrated with fat but otherwise unremarkable. Spleen: Normal in size without focal abnormality. Adrenals/Urinary Tract: Adrenal glands appear normal. LEFT kidney is somewhat atrophic/scarred but otherwise unremarkable without mass, stone or hydronephrosis. RIGHT kidney is unremarkable without mass, stone or hydronephrosis. No perinephric inflammation bilaterally. No ureteral or bladder calculi seen. Bladder appears normal. Stomach/Bowel: No large or small bowel dilatation. No bowel wall thickening or evidence of bowel wall inflammation seen. Appendix is normal. Stomach is unremarkable. Vascular/Lymphatic: Aortic atherosclerosis. No enlarged abdominal or pelvic lymph nodes. Reproductive: Prostate is unremarkable. Other: No free fluid or abscess collections  seen. No free intraperitoneal air. Musculoskeletal: Degenerative changes of the thoracolumbar spine, moderate in degree. No acute or suspicious osseous finding. IMPRESSION: 1. No acute findings within the abdomen or pelvis. No free fluid or inflammatory change seen. No bowel obstruction. No evidence of acute solid organ abnormality. No evidence of pyelonephritis. No renal or ureteral calculi. 2. Aortic atherosclerosis. 3. Additional chronic/incidental findings detailed above. Electronically Signed   By: SFranki CabotM.D.   On: 04/04/2018 18:56   [place]oreaAbdomen Limited  Result Date: 04/04/2018 CLINICAL DATA:  Vomiting for 1 day, elevated lipase. EXAM:  ULTRASOUND ABDOMEN LIMITED RIGHT UPPER QUADRANT COMPARISON:  CT abdomen from earlier same day. FINDINGS: Gallbladder: Gallbladder is mildly distended but otherwise unremarkable. No gallbladder wall thickening, pericholecystic fluid or other signs of acute cholecystitis. No gallstones seen. No sonographic Murphy's sign elicited during the exam. Common bile duct: Diameter: 5 mm Liver: At least mildly echogenic and difficult to penetrate suggesting fatty infiltration. No focal mass or lesion demonstrated. Portal vein is patent on color Doppler imaging with normal direction of blood flow towards the liver. IMPRESSION: 1. No acute findings. No evidence of cholecystitis. No gallstones seen. No bile duct dilatation. 2. Probable fatty infiltration of the liver. Electronically Signed   By: Franki Cabot M.D.   On: 04/04/2018 21:30    Assessment/Plan Active Problems:   Acute pancreatitis  Acute pancreatitis Meets criteria with N/V, abd pain and lipase >3x ULN. Etiology unclear, possibly idiopathic. Denies alcohol use. CT a/p and RUQ U/S unrevealing for biliary etiology. No hypercalcemia. No new meds. - Gentle hydration - Start clears, ADAT - Pain control - Antiemetics PRN - Check TGs - Trend fever curve - Monitor volume status - Daily CBC, CMP  Mild  hyponatremia, likely 2/2 poor solute intake - Gentle IVF as above - Repeat BMP in AM - Urine studies if no improvement  Chronic HFrEF, not in exacerbation - Gentle IVF as above - Continue home Coreg, pravastatin  Rheumatoid Arthritis, chronic - Continue home MTX Qweek  Crohn's disease, not in exacerbation - Continue daily mesalamine (although poor evidence for salicylates in Crohn's)  Dementia - Continue Aricept, Prozac - Delirium protocols  DVT prophylaxis: Lovenox Code Status: DNR/DNI Family Communication: Daughter, Ebony Hail  Disposition Plan: Home in 1-2 days Consults called: None Admission status: Inpatient   Bennie Pierini MD [facility]  If 7PM-7AM, please contact night-coverage [email or web address] Password [facility]  04/04/2018, 10:18 PM

## 2018-04-04 NOTE — ED Triage Notes (Signed)
Pt to ed via [facility] with complaints of N/V for the past two days. Per [facility] patient hasn't been able to get up and change his under garments daily and smells of strong urine smell.  Pt was warm to touch to [facility] as well. Pt has hx of Dementia, and his alert to his norm which is A&Ox2. Pt also has hx of RA and pt complains of Right hip pain.

## 2018-04-04 NOTE — ED Notes (Signed)
Bed: PY19 Expected date:  Expected time:  Means of arrival:  Comments: EMS UTI

## 2018-04-04 NOTE — ED Provider Notes (Signed)
[facility] DEPT Provider Note   CSN: [id number] Arrival date & time: 04/04/18  1504     History   Chief Complaint Chief Complaint  Patient presents with  . Urinary Tract Infection  . Nausea  . Emesis    HPI Anthony [name] is a 84 y.o. male.  HPI  Patient with history of dementia and is limited historian.  Level 5 caveat applies.  Patient's daughter is at bedside.  Patient is been vomiting for the past 3 to 4 days.  Unable to keep any oral intake down.  Was endorsing right-sided abdominal pain earlier but currently denies pain.  No diarrhea.  Daughter is concerned the patient is dehydrated.  Normally able to ambulate has not been able to for the last 2 days. Past Medical History:  Diagnosis Date  . Arthritis    rheumatoid-  Dr Barkley Boards  . CHF (congestive heart failure) (HCC)    chronic systolic heart failure per Dr [name] LOV note 09/23/11.   EKG 4/12, stress test  and cath from 8/12 on chart.  Clearance with LOV Dr [name] on chart  . Coronary artery disease   . Dysrhythmia    nonsustained,asymptomatic VT per Dr [name] office note  resulting in cath  . GERD (gastroesophageal reflux disease)   . Heart attack ([place]) 1996, 06/2011  . Hyperlipidemia   . Hypertension   . Pneumonia 1993  . Prostate troubles    states take alternating meds for bladder/prostate control- "age thing"  . Sleep apnea    sleep study years ago- has apnea but did not qualify for CPAP    Patient Active Problem List   Diagnosis Date Noted  . Abdominal pain, right lower quadrant 05/28/2015  . Absolute anemia 05/28/2015  . Aortic atherosclerosis ([place]) 05/28/2015  . Altered blood in stool 05/28/2015  . Chronic systolic heart failure ([place]) 05/28/2015  . Syncope and collapse 05/28/2015  . Arteriosclerosis of coronary artery 05/28/2015  . Cough 05/28/2015  . Crohn's disease of large bowel ([place]) 05/28/2015  . D (diarrhea) 05/28/2015  . Displacement of cervical intervertebral  disc without myelopathy 05/28/2015  . Benign essential HTN 05/28/2015  . Adenopathy 05/28/2015  . Hypercholesterolemia without hypertriglyceridemia 05/28/2015  . Hypo-osmolality and hyponatremia 05/28/2015  . Postinflammatory pulmonary fibrosis ([place]) 05/28/2015  . Healed myocardial infarct 05/28/2015  . Peptic esophagitis 05/28/2015  . 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 Paroxysmal ventricular tachycardia ([place]) 05/28/2015  . Decreased body weight 05/28/2015  . Weakness 11/05/2014  . Nausea with vomiting 11/05/2014  . Generalized weakness   . Dehydration   . Difficulty walking   . Pulmonary infiltrates 07/16/2014  . Chronic combined systolic and diastolic heart failure ([place]) 05/31/2014  . Essential hypertension 05/31/2014  .  H/O Right inguinal hernia repair 01/15/2012  . Small fat containing right inguinal hernia that has been painful 08/05/2011  . Finger wound, simple, open 10/12/2008  . Benign prostatic hypertrophy without urinary obstruction 04/30/2008  . Synovitis and tenosynovitis 04/30/2008  . HLD (hyperlipidemia) 04/12/2008  . COLONIC POLYPS 11/21/2007  . MI 11/21/2007  . Coronary atherosclerosis 11/21/2007  . GERD 11/21/2007  . Rheumatoid arthritis ([place]) 11/21/2007  . SLEEP APNEA, MILD 11/21/2007    Past Surgical History:  Procedure Laterality Date  . CARDIAC CATHETERIZATION     1996/ 2012 report on chart  . COLONOSCOPY    . INGUINAL HERNIA REPAIR  11/04/2011   Procedure: HERNIA REPAIR INGUINAL ADULT;  Surgeon: Pedro Earls, MD;  Location: [facility]S;  Service:  General;  Laterality: Right;  Right Inguinal Hernia Repair with Mesh  . KNEE ARTHROSCOPY Right 1976  . MENISCUS DEBRIDEMENT Left 1996  . TRIGGER FINGER RELEASE Right 10/10/2009  . UMBILICAL HERNIA REPAIR  2009  . WRIST SURGERY Left 04/25/91        Home Medications    Prior to Admission medications   Medication Sig Start Date End Date Taking? Authorizing Provider  CALCIUM-VITAMIN D PO Take 1,000 mg by mouth  daily.    Yes [provider]  carvedilol (COREG) 6.25 MG tablet TAKE 1 TABLET TWICE A DAY WITH A MEAL. 02/22/18  Yes Burtis Junes, NP  Coenzyme Q10 (CO Q-10) 100 MG CAPS Take 100 mg by mouth daily.   Yes [provider]  donepezil (ARICEPT) 10 MG tablet Take 10 mg by mouth at bedtime.   Yes [provider]  fish oil-omega-3 fatty acids 1000 MG capsule Take 1 g by mouth daily.    Yes [provider]  FLUoxetine (PROZAC) 20 MG capsule Take 20 mg by mouth daily.  09/06/16  Yes [provider]  folic acid (FOLVITE) 779 MCG tablet Take 400 mcg by mouth 2 (two) times daily.    Yes [provider]  magnesium oxide (MAG-OX) 400 MG tablet Take 400 mg by mouth daily.   Yes [provider]  mesalamine (LIALDA) 1.2 G EC tablet Take 4.8 g by mouth daily with breakfast.    Yes [provider]  pravastatin (PRAVACHOL) 40 MG tablet Take 1 tablet (40 mg total) by mouth at bedtime. 03/08/18  Yes Belva Crome, MD  ranitidine (ZANTAC) 300 MG capsule Take 300 mg by mouth At bedtime.  07/30/11  Yes [provider]  tamsulosin (FLOMAX) 0.4 MG CAPS capsule Take 1 capsule by mouth daily. 10/01/14  Yes [provider]  Vitamins A & D (VITAMIN A & D) 10000-400 UNITS CAPS Take 1 tablet by mouth daily.    Yes [provider]  methotrexate (RHEUMATREX) 2.5 MG tablet Take 8 tablets by mouth Once a week. Wednesday Take 8 tablets=20 mg. 07/02/11   [provider]  nitroGLYCERIN (NITROSTAT) 0.4 MG SL tablet Place 0.4 mg under the tongue every 5 (five) minutes as needed. Chest pain     [provider]  traMADol (ULTRAM) 50 MG tablet Take 50 mg by mouth every 6 (six) hours as needed for moderate pain or severe pain.  04/16/15   [provider]    Family History Family History  Problem Relation Age of Onset  . Heart disease Mother   . Rheum arthritis Mother   . Emphysema Father     Social History Social  History   Tobacco Use  . Smoking status: Former Smoker    Packs/day: 3.00    Years: 20.00    Pack years: 60.00    Types: Cigarettes    Last attempt to quit: 11/01/1970    Years since quitting: 47.4  . Smokeless tobacco: Never Used  . Tobacco comment: quit 1967  Substance Use Topics  . Alcohol use: No  . Drug use: No     Allergies   Patient has no known allergies.   Review of Systems Review of Systems  Unable to perform ROS: Dementia     Physical Exam Updated Vital Signs BP 132/73 (BP Location: Left Arm)   Pulse 74   Temp 98.6 F (37 C) (Oral)   Resp 17   Ht 6' (1.829 m)   Wt 83  kg (183 lb)   SpO2 96%   BMI 24.82 kg/m   Physical Exam  Constitutional: He appears well-developed and well-nourished. No distress.  HENT:  Head: Normocephalic and atraumatic.  Mouth/Throat: Oropharynx is clear and moist. No oropharyngeal exudate.  Eyes: Pupils are equal, round, and reactive to light. EOM are normal.  Neck: Normal range of motion. Neck supple.  Cardiovascular: Normal rate and regular rhythm. Exam reveals no gallop and no friction rub.  No murmur heard. Pulmonary/Chest: Effort normal and breath sounds normal. No stridor. No respiratory distress. He has no wheezes. He has no rales. He exhibits no tenderness.  Abdominal: Soft. Bowel sounds are normal. He exhibits distension. There is no tenderness. There is no rebound and no guarding.  Diffuse abdominal distention without focal tenderness, rebound or guarding.  Musculoskeletal: Normal range of motion. He exhibits no edema or tenderness.  No lower extremity swelling, asymmetry or tenderness.  Neurological: He is alert.  Moving all extremities without focal deficit.  Sensation grossly intact.  Oriented to person  Skin: Skin is warm and dry. Capillary refill takes less than 2 seconds. No rash noted. He is not diaphoretic. No erythema.  Nursing note and vitals reviewed.    ED Treatments / Results  Labs (all labs ordered  are listed, but only abnormal results are displayed) Labs Reviewed  LIPASE, BLOOD - Abnormal; Notable for the following components:      Result Value   Lipase 163 (*)    All other components within normal limits  COMPREHENSIVE METABOLIC PANEL - Abnormal; Notable for the following components:   Sodium 131 (*)    Chloride 98 (*)    Glucose, Bld 128 (*)    Calcium 8.6 (*)    Albumin 3.3 (*)    Total Bilirubin 2.1 (*)    All other components within normal limits  CBC - Abnormal; Notable for the following components:   WBC 11.5 (*)    All other components within normal limits  URINALYSIS, ROUTINE W REFLEX MICROSCOPIC - Abnormal; Notable for the following components:   Hgb urine dipstick SMALL (*)    All other components within normal limits  I-STAT TROPONIN, ED    EKG EKG Interpretation  Date/Time:  Monday Apr 04 2018 19:30:02 EDT Ventricular Rate:  75 PR Interval:    QRS Duration: 112 QT Interval:  430 QTC Calculation: 481 R Axis:   -55 Text Interpretation:  Sinus rhythm Left anterior fascicular block Abnormal R-wave progression, late transition Borderline prolonged QT interval Confirmed by Julianne Rice [phone]) on 04/04/2018 7:37:35 PM   Radiology Ct Abdomen Pelvis W Contrast  Result Date: 04/04/2018 CLINICAL DATA:  Nausea and vomiting for the past 2 days. History of dementia. EXAM: CT ABDOMEN AND PELVIS WITH CONTRAST TECHNIQUE: Multidetector CT imaging of the abdomen and pelvis was performed using the standard protocol following bolus administration of intravenous contrast. CONTRAST:  176m ISOVUE-300 IOPAMIDOL (ISOVUE-300) INJECTION 61% COMPARISON:  CT abdomen dated 07/03/2011. FINDINGS: Lower chest: Bibasilar atelectasis and/or fibrosis. Hepatobiliary: Gallbladder is mildly distended but otherwise unremarkable. No focal abnormality within the liver. No bile duct dilatation seen. Pancreas: Partially infiltrated with fat but otherwise unremarkable. Spleen: Normal in size  without focal abnormality. Adrenals/Urinary Tract: Adrenal glands appear normal. LEFT kidney is somewhat atrophic/scarred but otherwise unremarkable without mass, stone or hydronephrosis. RIGHT kidney is unremarkable without mass, stone or hydronephrosis. No perinephric inflammation bilaterally. No ureteral or bladder calculi seen. Bladder appears normal. Stomach/Bowel: No large or small bowel dilatation. No bowel wall  thickening or evidence of bowel wall inflammation seen. Appendix is normal. Stomach is unremarkable. Vascular/Lymphatic: Aortic atherosclerosis. No enlarged abdominal or pelvic lymph nodes. Reproductive: Prostate is unremarkable. Other: No free fluid or abscess collections seen. No free intraperitoneal air. Musculoskeletal: Degenerative changes of the thoracolumbar spine, moderate in degree. No acute or suspicious osseous finding. IMPRESSION: 1. No acute findings within the abdomen or pelvis. No free fluid or inflammatory change seen. No bowel obstruction. No evidence of acute solid organ abnormality. No evidence of pyelonephritis. No renal or ureteral calculi. 2. Aortic atherosclerosis. 3. Additional chronic/incidental findings detailed above. Electronically Signed   By: Franki Cabot M.D.   On: 04/04/2018 18:56   US Abdomen Limited  Result Date: 04/04/2018 CLINICAL DATA:  Vomiting for 1 day, elevated lipase. EXAM: ULTRASOUND ABDOMEN LIMITED RIGHT UPPER QUADRANT COMPARISON:  CT abdomen from earlier same day. FINDINGS: Gallbladder: Gallbladder is mildly distended but otherwise unremarkable. No gallbladder wall thickening, pericholecystic fluid or other signs of acute cholecystitis. No gallstones seen. No sonographic Murphy's sign elicited during the exam. Common bile duct: Diameter: 5 mm Liver: At least mildly echogenic and difficult to penetrate suggesting fatty infiltration. No focal mass or lesion demonstrated. Portal vein is patent on color Doppler imaging with normal direction of blood flow  towards the liver. IMPRESSION: 1. No acute findings. No evidence of cholecystitis. No gallstones seen. No bile duct dilatation. 2. Probable fatty infiltration of the liver. Electronically Signed   By: Franki Cabot M.D.   On: 04/04/2018 21:30    Procedures Procedures (including critical care time)  Medications Ordered in ED Medications  iopamidol (ISOVUE-300) 61 % injection (has no administration in time range)  iopamidol (ISOVUE-300) 61 % injection 100 mL (100 mLs Intravenous Contrast Given 04/04/18 1832)  sodium chloride 0.9 % bolus 500 mL (500 mLs Intravenous New Bag/Given 04/04/18 2108)     Initial Impression / Assessment and Plan / ED Course  I have reviewed the triage vital signs and the nursing notes.  Pertinent labs & imaging results that were available during my care of the patient were reviewed by me and considered in my medical decision making (see chart for details).     Lipase is elevated.  CT abdomen and abdominal ultrasound without acute findings.  Given IV fluids.  Discussed with hospitalist will admit for hydration.   Final Clinical Impressions(s) / ED Diagnoses   Final diagnoses:  Idiopathic acute pancreatitis, unspecified complication status    ED Discharge Orders    None       Julianne Rice, MD 04/04/18 2213

## 2018-04-05 ENCOUNTER — Other Ambulatory Visit: Payer: Self-pay

## 2018-04-05 ENCOUNTER — Encounter (HOSPITAL_COMMUNITY): Payer: Self-pay

## 2018-04-05 DIAGNOSIS — R531 Weakness: Secondary | ICD-10-CM

## 2018-04-05 DIAGNOSIS — I2510 Atherosclerotic heart disease of native coronary artery without angina pectoris: Secondary | ICD-10-CM

## 2018-04-05 LAB — CBC
HEMATOCRIT: 38.7 % — AB (ref 39.0–52.0)
HEMOGLOBIN: 13.1 g/dL (ref 13.0–17.0)
MCH: 31 pg (ref 26.0–34.0)
MCHC: 33.9 g/dL (ref 30.0–36.0)
MCV: 91.7 fL (ref 78.0–100.0)
Platelets: 260 10*3/uL (ref 150–400)
RBC: 4.22 MIL/uL (ref 4.22–5.81)
RDW: 15.3 % (ref 11.5–15.5)
WBC: 9.5 10*3/uL (ref 4.0–10.5)

## 2018-04-05 LAB — LIPASE, BLOOD: LIPASE: 152 U/L — AB (ref 11–51)

## 2018-04-05 LAB — PHOSPHORUS: PHOSPHORUS: 2.8 mg/dL (ref 2.5–4.6)

## 2018-04-05 LAB — MAGNESIUM: MAGNESIUM: 1.8 mg/dL (ref 1.7–2.4)

## 2018-04-05 LAB — COMPREHENSIVE METABOLIC PANEL
ALT: 16 U/L — ABNORMAL LOW (ref 17–63)
ANION GAP: 10 (ref 5–15)
AST: 16 U/L (ref 15–41)
Albumin: 2.9 g/dL — ABNORMAL LOW (ref 3.5–5.0)
Alkaline Phosphatase: 75 U/L (ref 38–126)
BILIRUBIN TOTAL: 1.7 mg/dL — AB (ref 0.3–1.2)
BUN: 10 mg/dL (ref 6–20)
CHLORIDE: 101 mmol/L (ref 101–111)
CO2: 24 mmol/L (ref 22–32)
Calcium: 8.5 mg/dL — ABNORMAL LOW (ref 8.9–10.3)
Creatinine, Ser: 0.75 mg/dL (ref 0.61–1.24)
Glucose, Bld: 118 mg/dL — ABNORMAL HIGH (ref 65–99)
POTASSIUM: 3.9 mmol/L (ref 3.5–5.1)
Sodium: 135 mmol/L (ref 135–145)
TOTAL PROTEIN: 6.6 g/dL (ref 6.5–8.1)

## 2018-04-05 LAB — GLUCOSE, CAPILLARY: Glucose-Capillary: 168 mg/dL — ABNORMAL HIGH (ref 65–99)

## 2018-04-05 LAB — TRIGLYCERIDES: Triglycerides: 74 mg/dL (ref <150)

## 2018-04-05 MED ORDER — SODIUM CHLORIDE 0.9 % IV SOLN
INTRAVENOUS | Status: AC
  Administered 2018-04-05 – 2018-04-06 (×2): via INTRAVENOUS

## 2018-04-05 NOTE — Progress Notes (Signed)
PROGRESS NOTE    Anthony [name]  ZOX:[id number] DOB: Aug 31, 1934 DOA: 04/04/2018 PCP: Josetta Huddle, MD   Brief Narrative:  Anthony [name] is a 84 y.o. male with medical history significant for HFrEF (EF 45% in 2015), CAD, Crohn's disease on mesalamine, RA on methotrexate, Alzheimer's dementia, HTN and BPH who presented with proximally 1 week of progressive nausea, vomiting and abdominal pain.  He reports that his symptoms have been worsened with eating greasy foods.  Pain is also worsened with rising from supine to seated position, as well as with ambulation.  He denies similar symptoms in the past.  He endorsed to 3 episodes of loose stool over similar period of time, he reports that his baseline is generally more constipated.  He denies alcohol consumption.  No new medications.  No recent travel.  No clear food indiscretions.  He reports adherence with his home medication regimen.    In the ED, CT of the Abd/Pelvis showed no acute findings. RUQ U/S showed no gallstones, cholecystic wall thickening or CBD dilation. Pt given 500 cc NS while in ED. He was admitted for Mild Pancreatitis and currently his diet is being advanced.   Assessment & Plan:   Active Problems:   Coronary atherosclerosis   Benign prostatic hyperplasia without urinary obstruction   Generalized weakness   Chronic systolic heart failure (HCC)   Crohn's disease of large bowel (HCC)   Benign essential HTN   Acute pancreatitis  Mild Acute Pancreatitis -Met criteria with N/V, abd pain and lipase >3x ULN on admission. Nausea/Vomiting and Abdominal Pain Improved  -Lipase Level was 163 -> 152; ? Dehydration -Etiology unclear, possibly idiopathic. Denies alcohol use.  -CT a/p and RUQ U/S unrevealing for biliary etiology.  -No hypercalcemia. No new meds. -Gentle Hydration stopped yesterday but will resume at 75 mL/hr x15 hours  -Started Clear Liquid Diet and advanced to FULL and if tolerating will go to Soft - Pain  control -Antiemetics with Ondansetron 4 mg po/IV q6hprn Nausea -Checked TGs and was 79 -WBC went from 11.5 -> 9.5 -Continue to Trend fever curve -Continue to Monitor volume status -Repeat CBC, CMP, and Lipase in AM -Gentle IVF resumed with NS at 75 mL/hr x15 hours  Generalized Weakness with Recent Fall at [place] from Above -PT/OT Evaluating and Recommending SNF -Social Work Consulted for assistance with placement   Mild Hyponatremia, likely 2/2 poor solute intake, improving  -Na+ was 131 and improved to 135 -Gentle IVF resumed with NS at 75 mL/hr x15 hours -Will obtain Urine studies if no improvement -Repeat CMP in AM   Chronic Systolic HFrEF -Currently not in exacerbation -Gentle IVF resumed with NS at 75 mL/hr x15 hours -Strict I's/O's, Daily Weights -Patient is -299.6 mL since admit; Weight on admission was 183 -Continue to Monitor Volume Status Carefully  Rheumatoid Arthritis, chronic -Continue Home MTX Qweek when improved   Crohn's Disease, not in exacerbation -Continue Daily Mesalamine 4.8 g po Daily with Breakfast  Alzheimer's Dementia -C/w Donepezil 10 mg qHS and Fluoxetine 20 mg po qHS -Delirium Precautions   GERD -Continue with Famotidine 20 mg p.o. nightly  BPH -C/w Tamsulosin 0.4 mg p.o. daily  OSAP -Did not qualify for CPAP as an outpatient   HLD -C/w Pravastatin 40 mg p.o. nightly  Hx of CAD/MI -Continue with Carvedilol 6.25 mg p.o. twice daily as well as Pravastatin 40 mg p.o. nightly  Essential HTN -Continue with Carvedilol 6.25 mg p.o. twice daily -BP on the lower side at 101/59  so will continue to monitor closely   Hyperbilirubinemia -Mildly elevated at 2.1 and improved to 1.7 -Continue to Monitor and Repeat CMP in AM   DVT prophylaxis: Enoxaparin 40 mg sq q24h Code Status: FULL CODE Family Communication: Discussed with Family at bedside  Disposition Plan: SNF if at D/C when medically stable and if agreeable   Consultants:    None   Procedures: None   Antimicrobials:  Anti-infectives (From admission, onward)   None     Subjective: Seen and examined and was feeling better.  States pain was improved.  No nausea, vomiting, shortness breath, lightheadedness or dizziness.  Felt like he could increase his diet.  No other complaints or concerns at this time.  Objective: Vitals:   04/05/18 0116 04/05/18 0518 04/05/18 1253 04/05/18 1710  BP: 133/75 (!) 145/72 (!) 101/59 95/64  Pulse: 84 82 72 77  Resp: _0 Temp: 97.7 F (36.5 C) 97.6 F (36.4 C) 97.8 F (36.6 C)   TempSrc: Oral Oral Oral   SpO2: 97% 95% 98%   Weight:      Height:        Intake/Output Summary (Last 24 hours) at 04/05/2018 1712 Last data filed at 04/05/2018 1500 Gross per 24 hour  Intake 2050.42 ml  Output 2650 ml  Net -599.58 ml   Filed Weights   04/04/18 1521  Weight: 83 kg (183 lb)   Examination: Physical Exam:  Constitutional: WN/WD Caucasian male in NAD and appears calm and comfortable Eyes: Lids and conjunctivae normal, sclerae anicteric  ENMT: External Ears, Nose appear normal. Grossly normal hearing.  Neck: Appears normal, supple, no cervical masses, normal ROM, no appreciable thyromegaly, no JVD Respiratory: Diminished to auscultation bilaterally, no wheezing, rales, rhonchi or crackles. Normal respiratory effort and patient is not tachypenic. No accessory muscle use.  Cardiovascular: RRR, no murmurs / rubs / gallops. S1 and S2 auscultated. No extremity edema. Abdomen: Soft, non-tender, non-distended. No masses palpated. No appreciable hepatosplenomegaly. Bowel sounds positive x4.  GU: Deferred. Musculoskeletal: No clubbing / cyanosis of digits/nails. No joint deformity upper and lower extremities. Skin: No rashes, lesions, ulcers on a limited skin evaluation. No induration; Warm and dry.  Neurologic: CN 2-12 grossly intact with no focal deficits. Romberg sign and cerebellar reflexes not assessed.    Psychiatric: Normal judgment and insight. Alert and awake. Normal mood and appropriate affect.   Data Reviewed: I have personally reviewed following labs and imaging studies  CBC: Recent Labs  Lab 04/04/18 1605 04/05/18 0407  WBC 11.5* 9.5  HGB 14.4 13.1  HCT 40.8 38.7*  MCV 90.5 91.7  PLT 275 295   Basic Metabolic Panel: Recent Labs  Lab 04/04/18 1605 04/04/18 2331 04/05/18 0407  NA 131*  --  135  K 4.1  --  3.9  CL 98*  --  101  CO2 23  --  24  GLUCOSE 128*  --  118*  BUN 10  --  10  CREATININE 0.73  --  0.75  CALCIUM 8.6*  --  8.5*  MG  --  1.8  --   PHOS  --  2.8  --    GFR: Estimated Creatinine Clearance: 78.1 mL/min (by C-G formula based on SCr of 0.75 mg/dL). Liver Function Tests: Recent Labs  Lab 04/04/18 1605 04/05/18 0407  AST 27 16  ALT 22 16*  ALKPHOS 84 75  BILITOT 2.1* 1.7*  PROT 7.4 6.6  ALBUMIN 3.3* 2.9*   Recent Labs  Lab 04/04/18  1605 04/05/18 0407  LIPASE 163* 152*   No results for input(s): AMMONIA in the last 168 hours. Coagulation Profile: No results for input(s): INR, PROTIME in the last 168 hours. Cardiac Enzymes: No results for input(s): CKTOTAL, CKMB, CKMBINDEX, TROPONINI in the last 168 hours. BNP (last 3 results) No results for input(s): PROBNP in the last 8760 hours. HbA1C: No results for input(s): HGBA1C in the last 72 hours. CBG: Recent Labs  Lab 04/05/18 0751  GLUCAP 168*   Lipid Profile: Recent Labs    04/04/18 2331  TRIG 74   Thyroid Function Tests: No results for input(s): TSH, T4TOTAL, FREET4, T3FREE, THYROIDAB in the last 72 hours. Anemia Panel: No results for input(s): VITAMINB12, FOLATE, FERRITIN, TIBC, IRON, RETICCTPCT in the last 72 hours. Sepsis Labs: No results for input(s): PROCALCITON, LATICACIDVEN in the last 168 hours.  No results found for this or any previous visit (from the past 240 hour(s)).   Radiology Studies: Ct Abdomen Pelvis W Contrast  Result Date: 04/04/2018 CLINICAL DATA:   Nausea and vomiting for the past 2 days. History of dementia. EXAM: CT ABDOMEN AND PELVIS WITH CONTRAST TECHNIQUE: Multidetector CT imaging of the abdomen and pelvis was performed using the standard protocol following bolus administration of intravenous contrast. CONTRAST:  148m ISOVUE-300 IOPAMIDOL (ISOVUE-300) INJECTION 61% COMPARISON:  CT abdomen dated 07/03/2011. FINDINGS: Lower chest: Bibasilar atelectasis and/or fibrosis. Hepatobiliary: Gallbladder is mildly distended but otherwise unremarkable. No focal abnormality within the liver. No bile duct dilatation seen. Pancreas: Partially infiltrated with fat but otherwise unremarkable. Spleen: Normal in size without focal abnormality. Adrenals/Urinary Tract: Adrenal glands appear normal. LEFT kidney is somewhat atrophic/scarred but otherwise unremarkable without mass, stone or hydronephrosis. RIGHT kidney is unremarkable without mass, stone or hydronephrosis. No perinephric inflammation bilaterally. No ureteral or bladder calculi seen. Bladder appears normal. Stomach/Bowel: No large or small bowel dilatation. No bowel wall thickening or evidence of bowel wall inflammation seen. Appendix is normal. Stomach is unremarkable. Vascular/Lymphatic: Aortic atherosclerosis. No enlarged abdominal or pelvic lymph nodes. Reproductive: Prostate is unremarkable. Other: No free fluid or abscess collections seen. No free intraperitoneal air. Musculoskeletal: Degenerative changes of the thoracolumbar spine, moderate in degree. No acute or suspicious osseous finding. IMPRESSION: 1. No acute findings within the abdomen or pelvis. No free fluid or inflammatory change seen. No bowel obstruction. No evidence of acute solid organ abnormality. No evidence of pyelonephritis. No renal or ureteral calculi. 2. Aortic atherosclerosis. 3. Additional chronic/incidental findings detailed above. Electronically Signed   By: SFranki CabotM.D.   On: 04/04/2018 18:56   [place]oreaAbdomen Limited  Result  Date: 04/04/2018 CLINICAL DATA:  Vomiting for 1 day, elevated lipase. EXAM: ULTRASOUND ABDOMEN LIMITED RIGHT UPPER QUADRANT COMPARISON:  CT abdomen from earlier same day. FINDINGS: Gallbladder: Gallbladder is mildly distended but otherwise unremarkable. No gallbladder wall thickening, pericholecystic fluid or other signs of acute cholecystitis. No gallstones seen. No sonographic Murphy's sign elicited during the exam. Common bile duct: Diameter: 5 mm Liver: At least mildly echogenic and difficult to penetrate suggesting fatty infiltration. No focal mass or lesion demonstrated. Portal vein is patent on color Doppler imaging with normal direction of blood flow towards the liver. IMPRESSION: 1. No acute findings. No evidence of cholecystitis. No gallstones seen. No bile duct dilatation. 2. Probable fatty infiltration of the liver. Electronically Signed   By: SFranki CabotM.D.   On: 04/04/2018 21:30   Scheduled Meds: . carvedilol  6.25 mg Oral BID  . docusate sodium  100 mg Oral  BID  . donepezil  10 mg Oral QHS  . enoxaparin (LOVENOX) injection  40 mg Subcutaneous Q24H  . famotidine  20 mg Oral QHS  . FLUoxetine  20 mg Oral Daily  . mesalamine  4.8 g Oral Q breakfast  . pravastatin  40 mg Oral QHS  . senna  1 tablet Oral BID  . tamsulosin  0.4 mg Oral Daily   Continuous Infusions: . sodium chloride 75 mL/hr at 04/05/18 1500    LOS: 1 day   Kerney Elbe, DO [facility] Pager [phone]  If 7PM-7AM, please contact night-coverage [email or web address] Password [facility] 04/05/2018, 5:12 PM

## 2018-04-05 NOTE — Plan of Care (Signed)
  Problem: Clinical Measurements: Goal: Ability to maintain clinical measurements within normal limits will improve Outcome: Progressing Goal: Will remain free from infection Outcome: Progressing Goal: Diagnostic test results will improve Outcome: Progressing Goal: Respiratory complications will improve Outcome: Progressing Goal: Cardiovascular complication will be avoided Outcome: Progressing   Problem: Nutrition: Goal: Adequate nutrition will be maintained Outcome: Progressing   Problem: Coping: Goal: Level of anxiety will decrease Outcome: Progressing   Problem: Elimination: Goal: Will not experience complications related to urinary retention Outcome: Progressing   Problem: Pain Managment: Goal: General experience of comfort will improve Outcome: Progressing   Problem: Safety: Goal: Ability to remain free from injury will improve Outcome: Progressing

## 2018-04-05 NOTE — ED Notes (Signed)
ED TO INPATIENT HANDOFF REPORT  Name/Age/Gender Anthony [name] 84 y.o. male  Code Status    Code Status Orders  (From admission, onward)        Start     Ordered   04/04/18 2331  Full code  Continuous     04/04/18 2330    Code Status History    Date Active Date Inactive Code Status Order ID Comments User Context   11/05/2014 0541 11/06/2014 1550 Full Code [id number]  Rise Patience, MD Inpatient      Home/SNF/Other Home  Chief Complaint GI Problems, UTI?  Level of Care/Admitting Diagnosis ED Disposition    ED Disposition Condition Comment   Admit  Hospital Area: [facility] [[id number]]  Level of Care: Med-Surg [16]  Diagnosis: Acute pancreatitis [577.0.ICD-9-CM]  Admitting Physician: Bennie Pierini [[id number]]  Attending Physician: Jonnie Finner, [place] [[id number]]  Estimated length of stay: past midnight tomorrow  Certification:: I certify this patient will need inpatient services for at least 2 midnights  PT Class (Do Not Modify): Inpatient [101]  PT Acc Code (Do Not Modify): Private [1]       Medical History Past Medical History:  Diagnosis Date  . Arthritis    rheumatoid-  Dr Barkley Boards  . CHF (congestive heart failure) (HCC)    chronic systolic heart failure per Dr [name] LOV note 09/23/11.   EKG 4/12, stress test  and cath from 8/12 on chart.  Clearance with LOV Dr [name] on chart  . Coronary artery disease   . Dysrhythmia    nonsustained,asymptomatic VT per Dr [name] office note  resulting in cath  . GERD (gastroesophageal reflux disease)   . Heart attack ([place]) 1996, 06/2011  . Hyperlipidemia   . Hypertension   . Pneumonia 1993  . Prostate troubles    states take alternating meds for bladder/prostate control- "age thing"  . Sleep apnea    sleep study years ago- has apnea but did not qualify for CPAP    Allergies No Known Allergies  IV Location/Drains/Wounds Patient Lines/Drains/Airways Status   Active Line/Drains/Airways     Name:   Placement date:   Placement time:   Site:   Days:   Peripheral IV 04/04/18 Right Forearm   04/04/18    1514    Forearm   1   Incision 11/04/11 Abdomen Bilateral   11/04/11    1243     2344          Labs/Imaging Results for orders placed or performed during the hospital encounter of 04/04/18 (from the past 48 hour(s))  Urinalysis, Routine w reflex microscopic     Status: Abnormal   Collection Time: 04/04/18  3:43 PM  Result Value Ref Range   Color, Urine YELLOW YELLOW   APPearance CLEAR CLEAR   Specific Gravity, Urine 1.013 1.005 - 1.030   pH 6.0 5.0 - 8.0   Glucose, UA NEGATIVE NEGATIVE mg/dL   Hgb urine dipstick SMALL (A) NEGATIVE   Bilirubin Urine NEGATIVE NEGATIVE   Ketones, ur NEGATIVE NEGATIVE mg/dL   Protein, ur NEGATIVE NEGATIVE mg/dL   Nitrite NEGATIVE NEGATIVE   Leukocytes, UA NEGATIVE NEGATIVE   RBC / HPF 0-5 0 - 5 RBC/hpf   WBC, UA 0-5 0 - 5 WBC/hpf   Bacteria, UA NONE SEEN NONE SEEN   Squamous Epithelial / LPF 0-5 0 - 5   Mucus PRESENT     Comment: Performed at [facility] [place] [address], [place], [place] [address]  Lipase, blood     Status: Abnormal   Collection Time: 04/04/18  4:05 PM  Result Value Ref Range   Lipase 163 (H) 11 - 51 U/L    Comment: Performed at [facility], [place] [address], [place], [place] [address]  Comprehensive metabolic panel     Status: Abnormal   Collection Time: 04/04/18  4:05 PM  Result Value Ref Range   Sodium 131 (L) 135 - 145 mmol/L   Potassium 4.1 3.5 - 5.1 mmol/L   Chloride 98 (L) 101 - 111 mmol/L   CO2 23 22 - 32 mmol/L   Glucose, Bld 128 (H) 65 - 99 mg/dL   BUN 10 6 - 20 mg/dL   Creatinine, Ser 0.73 0.61 - 1.24 mg/dL   Calcium 8.6 (L) 8.9 - 10.3 mg/dL   Total Protein 7.4 6.5 - 8.1 g/dL   Albumin 3.3 (L) 3.5 - 5.0 g/dL   AST 27 15 - 41 U/L   ALT 22 17 - 63 U/L   Alkaline Phosphatase 84 38 - 126 U/L   Total Bilirubin 2.1 (H) 0.3 - 1.2 mg/dL   GFR calc non Af Amer >60 >60  mL/min   GFR calc Af Amer >60 >60 mL/min    Comment: (NOTE) The eGFR has been calculated using the CKD EPI equation. This calculation has not been validated in all clinical situations. eGFR's persistently <60 mL/min signify possible Chronic Kidney Disease.    Anion gap 10 5 - 15    Comment: Performed at [facility], [place] [address], [place], [place] [address]  CBC     Status: Abnormal   Collection Time: 04/04/18  4:05 PM  Result Value Ref Range   WBC 11.5 (H) 4.0 - 10.5 K/uL   RBC 4.51 4.22 - 5.81 MIL/uL   Hemoglobin 14.4 13.0 - 17.0 g/dL   HCT 40.8 39.0 - 52.0 %   MCV 90.5 78.0 - 100.0 fL   MCH 31.9 26.0 - 34.0 pg   MCHC 35.3 30.0 - 36.0 g/dL   RDW 15.3 11.5 - 15.5 %   Platelets 275 150 - 400 K/uL    Comment: Performed at [facility], [place] [address], [place],  [address]  I-stat troponin, ED     Status: None   Collection Time: 04/04/18  7:37 PM  Result Value Ref Range   Troponin i, poc 0.00 0.00 - 0.08 ng/mL   Comment 3            Comment: Due to the release kinetics of cTnI, a negative result within the first hours of the onset of symptoms does not rule out myocardial infarction with certainty. If myocardial infarction is still suspected, repeat the test at appropriate intervals.    Ct Abdomen Pelvis W Contrast  Result Date: 04/04/2018 CLINICAL DATA:  Nausea and vomiting for the past 2 days. History of dementia. EXAM: CT ABDOMEN AND PELVIS WITH CONTRAST TECHNIQUE: Multidetector CT imaging of the abdomen and pelvis was performed using the standard protocol following bolus administration of intravenous contrast. CONTRAST:  135m ISOVUE-300 IOPAMIDOL (ISOVUE-300) INJECTION 61% COMPARISON:  CT abdomen dated 07/03/2011. FINDINGS: Lower chest: Bibasilar atelectasis and/or fibrosis. Hepatobiliary: Gallbladder is mildly distended but otherwise unremarkable. No focal abnormality within the liver. No bile duct dilatation seen. Pancreas:  Partially infiltrated with fat but otherwise unremarkable. Spleen: Normal in size without focal abnormality. Adrenals/Urinary Tract: Adrenal glands appear normal. LEFT kidney is somewhat atrophic/scarred but otherwise unremarkable without mass, stone or  hydronephrosis. RIGHT kidney is unremarkable without mass, stone or hydronephrosis. No perinephric inflammation bilaterally. No ureteral or bladder calculi seen. Bladder appears normal. Stomach/Bowel: No large or small bowel dilatation. No bowel wall thickening or evidence of bowel wall inflammation seen. Appendix is normal. Stomach is unremarkable. Vascular/Lymphatic: Aortic atherosclerosis. No enlarged abdominal or pelvic lymph nodes. Reproductive: Prostate is unremarkable. Other: No free fluid or abscess collections seen. No free intraperitoneal air. Musculoskeletal: Degenerative changes of the thoracolumbar spine, moderate in degree. No acute or suspicious osseous finding. IMPRESSION: 1. No acute findings within the abdomen or pelvis. No free fluid or inflammatory change seen. No bowel obstruction. No evidence of acute solid organ abnormality. No evidence of pyelonephritis. No renal or ureteral calculi. 2. Aortic atherosclerosis. 3. Additional chronic/incidental findings detailed above. Electronically Signed   By: Franki Cabot M.D.   On: 04/04/2018 18:56   US Abdomen Limited  Result Date: 04/04/2018 CLINICAL DATA:  Vomiting for 1 day, elevated lipase. EXAM: ULTRASOUND ABDOMEN LIMITED RIGHT UPPER QUADRANT COMPARISON:  CT abdomen from earlier same day. FINDINGS: Gallbladder: Gallbladder is mildly distended but otherwise unremarkable. No gallbladder wall thickening, pericholecystic fluid or other signs of acute cholecystitis. No gallstones seen. No sonographic Murphy's sign elicited during the exam. Common bile duct: Diameter: 5 mm Liver: At least mildly echogenic and difficult to penetrate suggesting fatty infiltration. No focal mass or lesion demonstrated.  Portal vein is patent on color Doppler imaging with normal direction of blood flow towards the liver. IMPRESSION: 1. No acute findings. No evidence of cholecystitis. No gallstones seen. No bile duct dilatation. 2. Probable fatty infiltration of the liver. Electronically Signed   By: Franki Cabot M.D.   On: 04/04/2018 21:30    Pending Labs Unresulted Labs (From admission, onward)   Start     Ordered   04/05/18 0500  Comprehensive metabolic panel  Tomorrow morning,   R     04/04/18 2330   04/05/18 0500  CBC  Tomorrow morning,   R     04/04/18 2330   04/04/18 2331  Magnesium  Add-on,   R     04/04/18 2330   04/04/18 2331  Phosphorus  Add-on,   R     04/04/18 2331   04/04/18 2331  Triglycerides  Add-on,   R     04/04/18 2331      Vitals/Pain Today's Vitals   04/04/18 2015 04/04/18 2024 04/04/18 2030 04/04/18 2210  BP: 112/71 138/65 109/66 132/73  Pulse: 75 70 74 74  Resp: 12 18 14 17   Temp:      TempSrc:      SpO2: 100% 98% 96% 96%  Weight:      Height:      PainSc:        Isolation Precautions No active isolations  Medications Medications  iopamidol (ISOVUE-300) 61 % injection (has no administration in time range)  carvedilol (COREG) tablet 6.25 mg (has no administration in time range)  donepezil (ARICEPT) tablet 10 mg (has no administration in time range)  FLUoxetine (PROZAC) capsule 20 mg (has no administration in time range)  mesalamine (LIALDA) EC tablet 4.8 g (has no administration in time range)  pravastatin (PRAVACHOL) tablet 40 mg (has no administration in time range)  famotidine (PEPCID) tablet 20 mg (has no administration in time range)  tamsulosin (FLOMAX) capsule 0.4 mg (has no administration in time range)  enoxaparin (LOVENOX) injection 40 mg (has no administration in time range)  lactated ringers infusion (has no administration in time  range)  acetaminophen (TYLENOL) tablet 650 mg (has no administration in time range)    Or  acetaminophen (TYLENOL)  suppository 650 mg (has no administration in time range)  oxyCODONE (Oxy IR/ROXICODONE) immediate release tablet 5 mg (has no administration in time range)  docusate sodium (COLACE) capsule 100 mg (has no administration in time range)  senna (SENOKOT) tablet 8.6 mg (has no administration in time range)  polyethylene glycol (MIRALAX / GLYCOLAX) packet 17 g (has no administration in time range)  ondansetron (ZOFRAN) tablet 4 mg (has no administration in time range)    Or  ondansetron (ZOFRAN) injection 4 mg (has no administration in time range)  iopamidol (ISOVUE-300) 61 % injection 100 mL (100 mLs Intravenous Contrast Given 04/04/18 1832)  sodium chloride 0.9 % bolus 500 mL (0 mLs Intravenous Stopped 04/04/18 2139)    Mobility non-ambulatory

## 2018-04-05 NOTE — Evaluation (Signed)
Physical Therapy Evaluation Patient Details Name: Anthony [name] MRN: [id number] DOB: [birth year] Today's Date: 04/05/2018   History of Present Illness  84 y.o. male with medical history significant for HFrEF (EF 45% in 2015), CAD, Crohn's disease on mesalamine, RA on methotrexate, Alzheimer's dementia, HTN and BPH and admitted with nausea, vomiting, abdominal pain and diagnosed with acute pancreatitis  Clinical Impression  Pt admitted with above diagnosis. Pt currently with functional limitations due to the deficits listed below (see PT Problem List).  Pt will benefit from skilled PT to increase their independence and safety with mobility to allow discharge to the venue listed below.  Pt assisted OOB and ambulated short distance in hallway.  Pt requiring increased assist for bed mobility.  Daughter reports pt with recent fall at home.  Recommend SNF upon d/c at this time.    Follow Up Recommendations SNF    Equipment Recommendations  None recommended by PT    Recommendations for Other Services       Precautions / Restrictions Precautions Precautions: Fall      Mobility  Bed Mobility Overal bed mobility: Needs Assistance Bed Mobility: Supine to Sit     Supine to sit: Max assist     General bed mobility comments: pt attempted to bring LEs over EOB and unable due to weakness, assist for bil LEs to EOB and trunk upright  Transfers Overall transfer level: Needs assistance Equipment used: Rolling walker (2 wheeled) Transfers: Sit to/from Stand Sit to Stand: Min assist         General transfer comment: verbal cues for forward flexion and hand placement, slight assist to steady upon rise  Ambulation/Gait Ambulation/Gait assistance: Min assist Ambulation Distance (Feet): 40 Feet Assistive device: Rolling walker (2 wheeled) Gait Pattern/deviations: Step-to pattern;Trunk flexed Gait velocity: decreased   General Gait Details: small steps, increased trunk flexion despite  posture cues (daughter says this is baseline), distance to tolerance, pt reports fatigue and weakness  Stairs            Wheelchair Mobility    Modified Rankin (Stroke Patients Only)       Balance Overall balance assessment: Needs assistance;History of Falls         Standing balance support: Bilateral upper extremity supported Standing balance-Leahy Scale: Poor Standing balance comment: requires UE support                             Pertinent Vitals/Pain Pain Assessment: No/denies pain    Home Living Family/patient expects to be discharged to:: Private residence Living Arrangements: Spouse/significant other Available Help at Discharge: Family;Available PRN/intermittently Type of Home: House Home Access: Stairs to enter   [employer] of Steps: 2-3 Home Layout: One level Home Equipment: Walker - 2 wheels;Cane - single point      Prior Function Level of Independence: Independent         Comments: typically ambulates without assistive device however daughter states pt needs to use something, hx of falls     Hand Dominance        Extremity/Trunk Assessment        Lower Extremity Assessment Lower Extremity Assessment: Generalized weakness    Cervical / Trunk Assessment Cervical / Trunk Assessment: Kyphotic  Communication   Communication: No difficulties  Cognition Arousal/Alertness: Awake/alert Behavior During Therapy: WFL for tasks assessed/performed Overall Cognitive Status: History of cognitive impairments - at baseline  General Comments      Exercises     Assessment/Plan    PT Assessment Patient needs continued PT services  PT Problem List Decreased strength;Decreased mobility;Decreased knowledge of use of DME;Decreased activity tolerance;Decreased balance       PT Treatment Interventions DME instruction;Therapeutic activities;Gait training;Therapeutic  exercise;Patient/family education;Functional mobility training;Balance training    PT Goals (Current goals can be found in the Care Plan section)  Acute Rehab PT Goals PT Goal Formulation: With patient/family Time For Goal Achievement: 04/19/18 Potential to Achieve Goals: Good    Frequency Min 2X/week   Barriers to discharge        Co-evaluation               AM-PAC PT "6 Clicks" Daily Activity  Outcome Measure Difficulty turning over in bed (including adjusting bedclothes, sheets and blankets)?: Unable Difficulty moving from lying on back to sitting on the side of the bed? : Unable Difficulty sitting down on and standing up from a chair with arms (e.g., wheelchair, bedside commode, etc,.)?: Unable Help needed moving to and from a bed to chair (including a wheelchair)?: A Little Help needed walking in hospital room?: A Little Help needed climbing 3-5 steps with a railing? : A Lot 6 Click Score: 11    End of Session Equipment Utilized During Treatment: Gait belt Activity Tolerance: Patient tolerated treatment well Patient left: in chair;with chair alarm set;with call bell/phone within reach;with family/visitor present Nurse Communication: Mobility status PT Visit Diagnosis: Other abnormalities of gait and mobility (R26.89)    Time: 6503-5465 PT Time Calculation (min) (ACUTE ONLY): 18 min   Charges:   PT Evaluation $PT Eval Low Complexity: 1 Low     PT G CodesCarmelia Bake, PT, DPT 04/05/2018 Pager: [phone]  York Ram E 04/05/2018, 1:07 PM

## 2018-04-06 DIAGNOSIS — K8500 Idiopathic acute pancreatitis without necrosis or infection: Principal | ICD-10-CM

## 2018-04-06 DIAGNOSIS — K5010 Crohn's disease of large intestine without complications: Secondary | ICD-10-CM

## 2018-04-06 DIAGNOSIS — K859 Acute pancreatitis without necrosis or infection, unspecified: Secondary | ICD-10-CM

## 2018-04-06 DIAGNOSIS — I10 Essential (primary) hypertension: Secondary | ICD-10-CM

## 2018-04-06 DIAGNOSIS — N400 Enlarged prostate without lower urinary tract symptoms: Secondary | ICD-10-CM

## 2018-04-06 DIAGNOSIS — I5022 Chronic systolic (congestive) heart failure: Secondary | ICD-10-CM

## 2018-04-06 DIAGNOSIS — K760 Fatty (change of) liver, not elsewhere classified: Secondary | ICD-10-CM

## 2018-04-06 LAB — GLUCOSE, CAPILLARY: Glucose-Capillary: 121 mg/dL — ABNORMAL HIGH (ref 65–99)

## 2018-04-06 LAB — COMPREHENSIVE METABOLIC PANEL
ALK PHOS: 73 U/L (ref 38–126)
ALT: 15 U/L — AB (ref 17–63)
AST: 19 U/L (ref 15–41)
Albumin: 2.6 g/dL — ABNORMAL LOW (ref 3.5–5.0)
Anion gap: 10 (ref 5–15)
BUN: 10 mg/dL (ref 6–20)
CALCIUM: 8.5 mg/dL — AB (ref 8.9–10.3)
CO2: 24 mmol/L (ref 22–32)
CREATININE: 0.7 mg/dL (ref 0.61–1.24)
Chloride: 102 mmol/L (ref 101–111)
GFR calc Af Amer: >60 mL/min (ref >60)
GFR calc non Af Amer: >60 mL/min (ref >60)
Glucose, Bld: 115 mg/dL — ABNORMAL HIGH (ref 65–99)
Potassium: 3.7 mmol/L (ref 3.5–5.1)
SODIUM: 136 mmol/L (ref 135–145)
Total Bilirubin: 1 mg/dL (ref 0.3–1.2)
Total Protein: 6.3 g/dL — ABNORMAL LOW (ref 6.5–8.1)

## 2018-04-06 LAB — CBC WITH DIFFERENTIAL/PLATELET
Basophils Absolute: 0 10*3/uL (ref 0.0–0.1)
Basophils Relative: 0 %
EOS ABS: 0.2 10*3/uL (ref 0.0–0.7)
Eosinophils Relative: 3 %
HCT: 37.7 % — ABNORMAL LOW (ref 39.0–52.0)
HEMOGLOBIN: 12.7 g/dL — AB (ref 13.0–17.0)
LYMPHS ABS: 1.2 10*3/uL (ref 0.7–4.0)
LYMPHS PCT: 18 %
MCH: 30.8 pg (ref 26.0–34.0)
MCHC: 33.7 g/dL (ref 30.0–36.0)
MCV: 91.3 fL (ref 78.0–100.0)
Monocytes Absolute: 0.6 10*3/uL (ref 0.1–1.0)
Monocytes Relative: 10 %
NEUTROS PCT: 69 %
Neutro Abs: 4.4 10*3/uL (ref 1.7–7.7)
Platelets: 233 10*3/uL (ref 150–400)
RBC: 4.13 MIL/uL — AB (ref 4.22–5.81)
RDW: 15.2 % (ref 11.5–15.5)
WBC: 6.4 10*3/uL (ref 4.0–10.5)

## 2018-04-06 LAB — LIPASE, BLOOD: Lipase: 36 U/L (ref 11–51)

## 2018-04-06 LAB — PHOSPHORUS: PHOSPHORUS: 2.6 mg/dL (ref 2.5–4.6)

## 2018-04-06 LAB — MAGNESIUM: Magnesium: 1.9 mg/dL (ref 1.7–2.4)

## 2018-04-06 MED ORDER — METHOTREXATE 2.5 MG PO TABS
20.0000 mg | ORAL_TABLET | ORAL | Status: DC
  Administered 2018-04-06: 20 mg via ORAL
  Filled 2018-04-06: qty 8

## 2018-04-06 MED ORDER — ENSURE ENLIVE PO LIQD
237.0000 mL | Freq: Two times a day (BID) | ORAL | Status: DC
  Administered 2018-04-06 – 2018-04-07 (×3): 237 mL via ORAL

## 2018-04-06 NOTE — Progress Notes (Signed)
Daughter at bedside states pt will go to SNF. CSW will follow.

## 2018-04-06 NOTE — NC FL2 (Signed)
[place] LEVEL OF CARE SCREENING TOOL     IDENTIFICATION  Patient Name: Anthony [name] Birthdate: 11-19-1934 Sex: male Admission Date (Current Location): 04/04/2018  [place] [facility] and [place] Number:  Herbalist and Address:  [facility],  [place] [address], [place]      Provider Number: [id number]  Attending Physician Name and Address:  Debbe Odea, MD  Relative Name and Phone Number:       Current Level of Care: Hospital Recommended Level of Care: [place] Prior Approval Number:    Date Approved/Denied:   PASRR Number: [id number]  Discharge Plan: SNF    Current Diagnoses: Patient Active Problem List   Diagnosis Date Noted  . Acute pancreatitis 04/04/2018  . Abdominal pain, right lower quadrant 05/28/2015  . Absolute anemia 05/28/2015  . Aortic atherosclerosis ([place]) 05/28/2015  . Altered blood in stool 05/28/2015  . Chronic systolic heart failure ([place]) 05/28/2015  . Syncope and collapse 05/28/2015  . Arteriosclerosis of coronary artery 05/28/2015  . Cough 05/28/2015  . Crohn's disease of large bowel ([place]) 05/28/2015  . D (diarrhea) 05/28/2015  . Displacement of cervical intervertebral disc without myelopathy 05/28/2015  . Benign essential HTN 05/28/2015  . Adenopathy 05/28/2015  . Hypercholesterolemia without hypertriglyceridemia 05/28/2015  . Hypo-osmolality and hyponatremia 05/28/2015  . Postinflammatory pulmonary fibrosis ([place]) 05/28/2015  . Healed myocardial infarct 05/28/2015  . Peptic esophagitis 05/28/2015  . 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 Paroxysmal ventricular tachycardia ([place]) 05/28/2015  . Decreased body weight 05/28/2015  . Weakness 11/05/2014  . Nausea with vomiting 11/05/2014  . Generalized weakness   . Dehydration   . Difficulty walking   . Pulmonary infiltrates 07/16/2014  . Chronic combined systolic and diastolic heart failure ([place]) 05/31/2014  . Essential hypertension 05/31/2014  .   H/O Right inguinal hernia repair 01/15/2012  . Small fat containing right inguinal hernia that has been painful 08/05/2011  . Finger wound, simple, open 10/12/2008  . Benign prostatic hyperplasia without urinary obstruction 04/30/2008  . Synovitis and tenosynovitis 04/30/2008  . HLD (hyperlipidemia) 04/12/2008  . COLONIC POLYPS 11/21/2007  . MI 11/21/2007  . Coronary atherosclerosis 11/21/2007  . GERD 11/21/2007  . Rheumatoid arthritis ([place]) 11/21/2007  . SLEEP APNEA, MILD 11/21/2007    Orientation RESPIRATION BLADDER Height & Weight     Self, Time, Place  Normal Incontinent, External catheter Weight: 173 lb 4.5 oz (78.6 kg) Height:  6' (182.9 cm)  BEHAVIORAL SYMPTOMS/MOOD NEUROLOGICAL BOWEL NUTRITION STATUS      Incontinent Diet(soft diet)  AMBULATORY STATUS COMMUNICATION OF NEEDS Skin   Extensive Assist Verbally Normal                       Personal Care Assistance Level of Assistance  Bathing, Feeding, Dressing Bathing Assistance: Limited assistance Feeding assistance: Independent Dressing Assistance: Limited assistance     Functional Limitations Info  Sight, Hearing, Speech Sight Info: Adequate Hearing Info: Adequate      SPECIAL CARE FACTORS FREQUENCY  PT (By licensed PT), OT (By licensed OT)     PT Frequency: 5x OT Frequency: 5x            Contractures Contractures Info: Not present    Additional Factors Info  Code Status, Allergies Code Status Info: full code Allergies Info: nka           Current Medications (04/06/2018):  This is the current hospital active medication list Current Facility-Administered Medications  Medication Dose Route Frequency  Provider Last Rate Last Dose  . acetaminophen (TYLENOL) tablet 650 mg  650 mg Oral Q6H PRN Bennie Pierini, MD       Or  . acetaminophen (TYLENOL) suppository 650 mg  650 mg Rectal Q6H PRN Bennie Pierini, MD      . carvedilol (COREG) tablet 6.25 mg  6.25 mg Oral BID Bennie Pierini, MD    6.25 mg at 04/06/18 0900  . docusate sodium (COLACE) capsule 100 mg  100 mg Oral BID Bennie Pierini, MD   100 mg at 04/06/18 0900  . donepezil (ARICEPT) tablet 10 mg  10 mg Oral QHS Bennie Pierini, MD   10 mg at 04/05/18 2125  . enoxaparin (LOVENOX) injection 40 mg  40 mg Subcutaneous Q24H Bennie Pierini, MD   40 mg at 04/06/18 0900  . famotidine (PEPCID) tablet 20 mg  20 mg Oral QHS Bennie Pierini, MD   20 mg at 04/05/18 2125  . feeding supplement (ENSURE ENLIVE) (ENSURE ENLIVE) liquid 237 mL  237 mL Oral BID BM Rizwan, Saima, MD      . FLUoxetine (PROZAC) capsule 20 mg  20 mg Oral Daily Bennie Pierini, MD   20 mg at 04/06/18 0900  . mesalamine (LIALDA) EC tablet 4.8 g  4.8 g Oral Q breakfast Bennie Pierini, MD   4.8 g at 04/06/18 0859  . methotrexate (RHEUMATREX) tablet 20 mg  20 mg Oral Weekly Rizwan, Eunice Blase, MD      . ondansetron (ZOFRAN) tablet 4 mg  4 mg Oral Q6H PRN Bennie Pierini, MD       Or  . ondansetron [facility]) injection 4 mg  4 mg Intravenous Q6H PRN Bennie Pierini, MD      . oxyCODONE (Oxy IR/ROXICODONE) immediate release tablet 5 mg  5 mg Oral Q4H PRN Bennie Pierini, MD      . polyethylene glycol [facility] Flor[facility]) packet 17 g  17 g Oral Daily PRN Bennie Pierini, MD      . pravastatin (PRAVACHOL) tablet 40 mg  40 mg Oral QHS Bennie Pierini, MD   40 mg at 04/05/18 2125  . senna (SENOKOT) tablet 8.6 mg  1 tablet Oral BID Bennie Pierini, MD   8.6 mg at 04/06/18 0859  . tamsulosin (FLOMAX) capsule 0.4 mg  0.4 mg Oral Daily Bennie Pierini, MD   0.4 mg at 04/06/18 6213     Discharge Medications: Please see discharge summary for a list of discharge medications.  Relevant Imaging Results:  Relevant Lab Results:   Additional Information SS# [id number]  Nila Nephew, LCSW

## 2018-04-06 NOTE — Evaluation (Signed)
Occupational Therapy Evaluation Patient Details Name: Anthony [name] MRN: [id number] DOB: [birth year] Today's Date: 04/06/2018    History of Present Illness 84 y.o. male with medical history significant for HFrEF (EF 45% in 2015), CAD, Crohn's disease on mesalamine, RA on methotrexate, Alzheimer's dementia, HTN and BPH and admitted with nausea, vomiting, abdominal pain and diagnosed with acute pancreatitis   Clinical Impression   Pt admitted with pancreatitis. Pt currently with functional limitations due to the deficits listed below (see OT Problem List).  Pt will benefit from skilled OT to increase their safety and independence with ADL and functional mobility for ADL to facilitate discharge to venue listed below.      Follow Up Recommendations  SNF    Equipment Recommendations  None recommended by OT    Recommendations for Other Services       Precautions / Restrictions Precautions Precautions: Fall      Mobility Bed Mobility Overal bed mobility: Needs Assistance Bed Mobility: Supine to Sit     Supine to sit: Max assist     General bed mobility comments: pt attempted to bring LEs over EOB and unable due to weakness, assist for bil LEs to EOB and trunk upright  Transfers Overall transfer level: Needs assistance Equipment used: Rolling walker (2 wheeled) Transfers: Sit to/from [facility] Sit to Stand: Mod assist Stand pivot transfers: Min assist            Balance Overall balance assessment: Needs assistance;History of Falls         Standing balance support: Bilateral upper extremity supported Standing balance-Leahy Scale: Poor Standing balance comment: requires UE support                           ADL either performed or assessed with clinical judgement   ADL Overall ADL's : Needs assistance/impaired Eating/Feeding: Set up;Sitting   Grooming: Minimal assistance;Sitting   Upper Body Bathing: Moderate assistance;Sitting    Lower Body Bathing: Maximal assistance;Sit to/from stand;Cueing for sequencing;Cueing for safety   Upper Body Dressing : Minimal assistance;Sitting   Lower Body Dressing: Maximal assistance;Sit to/from stand;Cueing for sequencing;Cueing for safety   Toilet Transfer: Maximal assistance;Stand-pivot;RW Toilet Transfer Details (indicate cue type and reason): bed to chair Toileting- Clothing Manipulation and Hygiene: Total assistance;Sit to/from stand               Vision Patient Visual Report: No change from baseline              Pertinent Vitals/Pain Pain Assessment: No/denies pain     Hand Dominance     Extremity/Trunk Assessment Upper Extremity Assessment Upper Extremity Assessment: Generalized weakness           Communication Communication Communication: No difficulties   Cognition Arousal/Alertness: Awake/alert Behavior During Therapy: WFL for tasks assessed/performed Overall Cognitive Status: History of cognitive impairments - at baseline                                                Home Living Family/patient expects to be discharged to:: Skilled nursing facility Living Arrangements: Spouse/significant other Available Help at Discharge: Family;Available PRN/intermittently Type of Home: House Home Access: Stairs to enter [employer] of Steps: 2-3   Home Layout: One level         Bathroom Toilet: Standard  Home Equipment: [place] - 2 wheels;Cane - single point          Prior Functioning/Environment Level of Independence: Independent        Comments: typically ambulates without assistive device however daughter states pt needs to use something, hx of falls        OT Problem List: Decreased strength;Decreased activity tolerance;Decreased safety awareness;Impaired balance (sitting and/or standing)      OT Treatment/Interventions: Self-care/ADL training;Patient/family education;DME and/or AE instruction     OT Goals(Current goals can be found in the care plan section) Acute Rehab OT Goals Patient Stated Goal: get well OT Goal Formulation: With patient Time For Goal Achievement: 04/20/18  OT Frequency: Min 2X/week   Barriers to D/C:               AM-PAC PT "6 Clicks" Daily Activity     Outcome Measure Help from another person eating meals?: A Little Help from another person taking care of personal grooming?: A Little Help from another person toileting, which includes using toliet, bedpan, or urinal?: Total Help from another person bathing (including washing, rinsing, drying)?: A Little Help from another person to put on and taking off regular upper body clothing?: A Little Help from another person to put on and taking off regular lower body clothing?: A Lot 6 Click Score: 15   End of Session Equipment Utilized During Treatment: Rolling walker;Gait belt Nurse Communication: Mobility status  Activity Tolerance: Patient tolerated treatment well Patient left: in chair;with call bell/phone within reach;with chair alarm set;with family/visitor present  OT Visit Diagnosis: Unsteadiness on feet (R26.81);History of falling (Z91.81);Other abnormalities of gait and mobility (R26.89);Muscle weakness (generalized) (M62.81)                Time: 8377-9396 OT Time Calculation (min): 19 min Charges:  OT General Charges $OT Visit: 1 Visit OT Evaluation $OT Eval Moderate Complexity: 1 Mod G-Codes:     Kari Baars, OT [phone]  Betsy Pries 04/06/2018, 12:40 PM

## 2018-04-06 NOTE — Progress Notes (Signed)
Initial Nutrition Assessment  DOCUMENTATION CODES:   Not applicable  INTERVENTION:   Ensure Enlive po BID, each supplement provides 350 kcal and 20 grams of protein  NUTRITION DIAGNOSIS:   Inadequate oral intake related to vomiting, nausea as evidenced by per patient/family report.  GOAL:   Patient will meet greater than or equal to 90% of their needs  MONITOR:   PO intake, Supplement acceptance, Weight trends, Labs, Diet advancement  REASON FOR ASSESSMENT:   Malnutrition Screening Tool    ASSESSMENT:   Patient with PMH significant for CHF, CAD, HLD, HTN, Alzheimer's dementia, and Crohn's disease (on mesalamine). Presents this admission with complaints of nausea/vomiting one week PTA. Found to have acute pancreatitis.    Spoke with daughter at bedside. Reports pt and wife lives at home alone and his wife cooks most meals. Daughter has attempted to convince them to eat "healthier options" but they usually stick to things they like (sweets). Daughter states pt's appetite decreased during this one week time period PTA but is now increasing back to normal. Discussed the importance of protein intake for preservation of lean body mass in advancing age. Discussed high protein options pt and wife can eat at home.  Pt has tolerated full liquids and his diet has progressed to soft today. Pt amendable to supplementation. RD to order.   Daughter endorses pt's UBW stays around 180 lb and denies he had any recent wt loss. Records indicate pt weighed 183 lb 02/22/18 and 173 lb this admission. Unable to identify if this is fluid loss versus dry weight loss given history of CHF. Nutrition-Focused physical exam completed.   Medications reviewed.  Labs reviewed.   NUTRITION - FOCUSED PHYSICAL EXAM:    Most Recent Value  Orbital Region  No depletion  Upper Arm Region  Mild depletion  Thoracic and Lumbar Region  No depletion  Buccal Region  No depletion  Temple Region  Moderate depletion   Clavicle Bone Region  Mild depletion  Clavicle and Acromion Bone Region  No depletion  Scapular Bone Region  No depletion  Dorsal Hand  No depletion  Patellar Region  No depletion  Anterior Thigh Region  No depletion  Posterior Calf Region  No depletion  Edema (RD Assessment)  Mild  Hair  Reviewed  Eyes  Reviewed  Mouth  Reviewed  Skin  Reviewed  Nails  Reviewed     Diet Order:   Diet Order           DIET SOFT Room service appropriate? Yes; Fluid consistency: Thin  Diet effective now          EDUCATION NEEDS:   Education needs have been addressed  Skin:  Skin Assessment: Reviewed RN Assessment  Last BM:  04/04/18  Height:   Ht Readings from Last 1 Encounters:  04/04/18 6' (1.829 m)    Weight:   Wt Readings from Last 1 Encounters:  04/06/18 173 lb 4.5 oz (78.6 kg)    Ideal Body Weight:  80.9 kg  BMI:  Body mass index is 23.5 kg/m.  Estimated Nutritional Needs:   Kcal:  2100-2300 kcal  Protein:  105-115 g  Fluid:  >2.1 L/day    Mariana Single RD, LDN Clinical Nutrition Pager # - [phone]

## 2018-04-06 NOTE — Progress Notes (Addendum)
PROGRESS NOTE    Anthony [name]   OMV:[id number]  DOB: 09-Jul-1934  DOA: 04/04/2018 PCP: Anthony Huddle, MD   Brief Narrative:  Anthony [name] medical history significantfor HFrEF (EF 45% in 2015), CAD, Crohn's disease on mesalamine, RA on methotrexate,Alzheimer'sdementia, HTN and BPH who presented withproximally 1 week of progressivenausea, vomiting and abdominal pain. He reports that his symptoms have been worsened with eating greasy foods.  CT of the Abd/Pelvis showed no acute findings. RUQ U/S showed no gallstones, cholecystic wall thickening or CBD dilation  Subjective: Drinking full liquids without any trouble. He daughter states he wraps his hands in heating pad when his rheumatoid arthritis is bad and leaves them on his lap and has a rash on his thighs.    Assessment & Plan:   Principal Problem:   Acute pancreatitis - no cause found - imaging shows fatty infiltration of the pancreast but other wise no cause for pancreatitis. No clear inflammation of the pancreas noted either.  - symptoms improved- advance to soft diet and if tolerated, d/c home tomorrow  Active Problems:      Generalized weakness - per PT/ OT, needs SNF- he is an agreement with it    Chronic systolic heart failure/ dehydration with mild hyponatremia - has improved with IVF which I have d/c'd today - cont Coreg  Fatty liver  - noted on imaging- discussed with patient and daughter    Crohn's disease of large bowel  - cont Mesalamine  Rh arthritis - cont Methotrexate    Benign prostatic hyperplasia without urinary obstruction - Flomax  Dementia- no behavioral disturbances - Aricept-   DVT prophylaxis: Lovenox Code Status: Full code Family Communication: daughter Disposition Plan: SNF Consultants:   none Procedures:    Antimicrobials:  Anti-infectives (From admission, onward)   None       Objective: Vitals:   04/05/18 1253  04/05/18 1710 04/06/18 0423 04/06/18 1244  BP: (!) 101/59 95/64 (!) 147/76 112/64  Pulse: 72 77 75 83  Resp: 18  16 19   Temp: 97.8 F (36.6 C)  (!) 97.4 F (36.3 C) 98.2 F (36.8 C)  TempSrc: Oral  Oral Oral  SpO2: 98%  97% 100%  Weight:   78.6 kg (173 lb 4.5 oz)   Height:        Intake/Output Summary (Last 24 hours) at 04/06/2018 1538 Last data filed at 04/06/2018 1255 Gross per 24 hour  Intake 1545 ml  Output 3900 ml  Net -2355 ml   Filed Weights   04/04/18 1521 04/06/18 0423  Weight: 83 kg (183 lb) 78.6 kg (173 lb 4.5 oz)    Examination: General exam: Appears comfortable  HEENT: PERRLA, oral mucosa moist, no sclera icterus or thrush Respiratory system: Clear to auscultation. Respiratory effort normal. Cardiovascular system: S1 & S2 heard, RRR.   Gastrointestinal system: Abdomen soft, non-tender, nondistended. Normal bowel sound. No organomegaly Central nervous system: Alert and oriented. No focal neurological deficits. Extremities: No cyanosis, clubbing or edema Skin: he has healing burn marks from a heating pad on his thighs- the skin is not broken but only discolored Psychiatry:  Mood & affect appropriate.     Data Reviewed: I have personally reviewed following labs and imaging studies  CBC: Recent Labs  Lab 04/04/18 1605 04/05/18 0407 04/06/18 0354  WBC 11.5* 9.5 6.4  NEUTROABS  --   --  4.4  HGB 14.4 13.1 12.7*  HCT 40.8 38.7* 37.7*  MCV 90.5 91.7  91.3  PLT 275 260 132   Basic Metabolic Panel: Recent Labs  Lab 04/04/18 1605 04/04/18 2331 04/05/18 0407 04/06/18 0354  NA 131*  --  135 136  K 4.1  --  3.9 3.7  CL 98*  --  101 102  CO2 23  --  24 24  GLUCOSE 128*  --  118* 115*  BUN 10  --  10 10  CREATININE 0.73  --  0.75 0.70  CALCIUM 8.6*  --  8.5* 8.5*  MG  --  1.8  --  1.9  PHOS  --  2.8  --  2.6   GFR: Estimated Creatinine Clearance: 78.1 mL/min (by C-G formula based on SCr of 0.7 mg/dL). Liver Function Tests: Recent Labs  Lab  04/04/18 1605 04/05/18 0407 04/06/18 0354  AST 27 16 19   ALT 22 16* 15*  ALKPHOS 84 75 73  BILITOT 2.1* 1.7* 1.0  PROT 7.4 6.6 6.3*  ALBUMIN 3.3* 2.9* 2.6*   Recent Labs  Lab 04/04/18 1605 04/05/18 0407 04/06/18 0354  LIPASE 163* 152* 36   No results for input(s): AMMONIA in the last 168 hours. Coagulation Profile: No results for input(s): INR, PROTIME in the last 168 hours. Cardiac Enzymes: No results for input(s): CKTOTAL, CKMB, CKMBINDEX, TROPONINI in the last 168 hours. BNP (last 3 results) No results for input(s): PROBNP in the last 8760 hours. HbA1C: No results for input(s): HGBA1C in the last 72 hours. CBG: Recent Labs  Lab 04/05/18 0751 04/06/18 0738  GLUCAP 168* 121*   Lipid Profile: Recent Labs    04/04/18 2331  TRIG 74   Thyroid Function Tests: No results for input(s): TSH, T4TOTAL, FREET4, T3FREE, THYROIDAB in the last 72 hours. Anemia Panel: No results for input(s): VITAMINB12, FOLATE, FERRITIN, TIBC, IRON, RETICCTPCT in the last 72 hours. Urine analysis:    Component Value Date/Time   COLORURINE YELLOW 04/04/2018 1543   APPEARANCEUR CLEAR 04/04/2018 1543   LABSPEC 1.013 04/04/2018 1543   PHURINE 6.0 04/04/2018 1543   GLUCOSEU NEGATIVE 04/04/2018 1543   HGBUR SMALL (A) 04/04/2018 1543   BILIRUBINUR NEGATIVE 04/04/2018 1543   KETONESUR NEGATIVE 04/04/2018 1543   PROTEINUR NEGATIVE 04/04/2018 1543   UROBILINOGEN 0.2 11/05/2014 0303   NITRITE NEGATIVE 04/04/2018 1543   LEUKOCYTESUR NEGATIVE 04/04/2018 1543   Sepsis Labs: @LABRCNTIP (procalcitonin:4,lacticidven:4) )No results found for this or any previous visit (from the past 240 hour(s)).       Radiology Studies: Ct Abdomen Pelvis W Contrast  Result Date: 04/04/2018 CLINICAL DATA:  Nausea and vomiting for the past 2 days. History of dementia. EXAM: CT ABDOMEN AND PELVIS WITH CONTRAST TECHNIQUE: Multidetector CT imaging of the abdomen and pelvis was performed using the standard protocol  following bolus administration of intravenous contrast. CONTRAST:  164m ISOVUE-300 IOPAMIDOL (ISOVUE-300) INJECTION 61% COMPARISON:  CT abdomen dated 07/03/2011. FINDINGS: Lower chest: Bibasilar atelectasis and/or fibrosis. Hepatobiliary: Gallbladder is mildly distended but otherwise unremarkable. No focal abnormality within the liver. No bile duct dilatation seen. Pancreas: Partially infiltrated with fat but otherwise unremarkable. Spleen: Normal in size without focal abnormality. Adrenals/Urinary Tract: Adrenal glands appear normal. LEFT kidney is somewhat atrophic/scarred but otherwise unremarkable without mass, stone or hydronephrosis. RIGHT kidney is unremarkable without mass, stone or hydronephrosis. No perinephric inflammation bilaterally. No ureteral or bladder calculi seen. Bladder appears normal. Stomach/Bowel: No large or small bowel dilatation. No bowel wall thickening or evidence of bowel wall inflammation seen. Appendix is normal. Stomach is unremarkable. Vascular/Lymphatic: Aortic atherosclerosis. No enlarged abdominal or  pelvic lymph nodes. Reproductive: Prostate is unremarkable. Other: No free fluid or abscess collections seen. No free intraperitoneal air. Musculoskeletal: Degenerative changes of the thoracolumbar spine, moderate in degree. No acute or suspicious osseous finding. IMPRESSION: 1. No acute findings within the abdomen or pelvis. No free fluid or inflammatory change seen. No bowel obstruction. No evidence of acute solid organ abnormality. No evidence of pyelonephritis. No renal or ureteral calculi. 2. Aortic atherosclerosis. 3. Additional chronic/incidental findings detailed above. Electronically Signed   By: Franki Cabot M.D.   On: 04/04/2018 18:56   US Abdomen Limited  Result Date: 04/04/2018 CLINICAL DATA:  Vomiting for 1 day, elevated lipase. EXAM: ULTRASOUND ABDOMEN LIMITED RIGHT UPPER QUADRANT COMPARISON:  CT abdomen from earlier same day. FINDINGS: Gallbladder: Gallbladder  is mildly distended but otherwise unremarkable. No gallbladder wall thickening, pericholecystic fluid or other signs of acute cholecystitis. No gallstones seen. No sonographic Murphy's sign elicited during the exam. Common bile duct: Diameter: 5 mm Liver: At least mildly echogenic and difficult to penetrate suggesting fatty infiltration. No focal mass or lesion demonstrated. Portal vein is patent on color Doppler imaging with normal direction of blood flow towards the liver. IMPRESSION: 1. No acute findings. No evidence of cholecystitis. No gallstones seen. No bile duct dilatation. 2. Probable fatty infiltration of the liver. Electronically Signed   By: Franki Cabot M.D.   On: 04/04/2018 21:30      Scheduled Meds: . carvedilol  6.25 mg Oral BID  . docusate sodium  100 mg Oral BID  . donepezil  10 mg Oral QHS  . enoxaparin (LOVENOX) injection  40 mg Subcutaneous Q24H  . famotidine  20 mg Oral QHS  . feeding supplement (ENSURE ENLIVE)  237 mL Oral BID BM  . FLUoxetine  20 mg Oral Daily  . mesalamine  4.8 g Oral Q breakfast  . methotrexate  20 mg Oral Weekly  . pravastatin  40 mg Oral QHS  . senna  1 tablet Oral BID  . tamsulosin  0.4 mg Oral Daily   Continuous Infusions:   LOS: 2 days    Time spent in minutes: 35    Debbe Odea, MD [facility] Pager: [email or web address] Password [facility] 04/06/2018, 3:38 PM

## 2018-04-07 ENCOUNTER — Encounter: Payer: Self-pay | Admitting: *Deleted

## 2018-04-07 DIAGNOSIS — E871 Hypo-osmolality and hyponatremia: Secondary | ICD-10-CM

## 2018-04-07 DIAGNOSIS — K760 Fatty (change of) liver, not elsewhere classified: Secondary | ICD-10-CM

## 2018-04-07 DIAGNOSIS — E860 Dehydration: Secondary | ICD-10-CM

## 2018-04-07 LAB — GLUCOSE, CAPILLARY: GLUCOSE-CAPILLARY: 87 mg/dL (ref 65–99)

## 2018-04-07 MED ORDER — SENNA 8.6 MG PO TABS: 1.0000 | ORAL_TABLET | Freq: Two times a day (BID) | ORAL | 0 refills | Status: DC

## 2018-04-07 MED ORDER — POLYETHYLENE GLYCOL 3350 17 G PO PACK: 17.0000 g | PACK | Freq: Every day | ORAL | 0 refills | Status: DC | PRN

## 2018-04-07 MED ORDER — ENSURE ENLIVE PO LIQD: 237.0000 mL | Freq: Two times a day (BID) | ORAL | 12 refills | Status: DC

## 2018-04-07 NOTE — Care Management Important Message (Signed)
Important Message  Patient Details  Name: Anthony [name] MRN: [id number] Date of Birth: [birth year]   Medicare Important Message Given:  Yes    Kerin Salen 04/07/2018, 11:33 AMImportant Message  Patient Details  Name: Anthony [name] MRN: [id number] Date of Birth: 1934-09-17   Medicare Important Message Given:  Yes    Kerin Salen 04/07/2018, 11:33 AM

## 2018-04-07 NOTE — Discharge Summary (Signed)
Physician Discharge Summary  Anthony [name]:[id number] DOB: 1934-08-05 DOA: 04/04/2018  PCP: Anthony Huddle, MD  Admit date: 04/04/2018 Discharge date: 04/07/2018  Admitted From: home with wife Disposition:  same    Home Health:  PT       Discharge Condition:  stable   CODE STATUS:  Full code   Diet recommendation: heart healthy Consultations:  none    Discharge Diagnoses:  Principal Problem:   Acute pancreatitis Active Problems:   Coronary atherosclerosis   Benign prostatic hyperplasia without urinary obstruction   Generalized weakness   Chronic systolic heart failure (HCC)   Crohn's disease of large bowel ([place])   Benign essential HTN   Fatty liver    Subjective: Tolerating solid food without issues. They have chosen to take him home with home health.   Brief Summary: Anthony [name] a 84 y.o.malewith medical history significantfor HFrEF (EF 45% in 2015), CAD, Crohn's disease on mesalamine, RA on methotrexate,Alzheimer'sdementia, HTN and BPH who presented withproximally 1 week of progressivenausea, vomiting and abdominal pain. He reports that his symptoms have been worsened with eating greasy foods.  CT of the Abd/Pelvisshowed no acute findings. RUQ U/S showed no gallstones, cholecystic wall thickening or CBD dilation     Hospital Course:  Principal Problem:   Acute pancreatitis - no cause found - imaging shows fatty infiltration of the pancreas but other wise no cause for pancreatitis. No clear inflammation of the pancreas noted either.  - symptoms improved- advanced to soft diet yesterday. No abdominal pain, nausea, vomiting or diarrhea noted after this change-   Active Problems:      Generalized weakness - per PT/ OT, initially needed SNF but now he is minimal assist and has walked 120 ft with a rolling walker      Chronic systolic heart failure/ dehydration with mild hyponatremia - dehydration and sodium level has  improved with IVF   - cont Coreg  Fatty liver  - noted on imaging- discussed with patient and daughter    Crohn's disease of large bowel  - cont Mesalamine  Rh arthritis - cont Methotrexate    Benign prostatic hyperplasia without urinary obstruction - Flomax  Dementia- no behavioral disturbances - Aricept-   Discoloration/ burn on b/l thighs He daughter states he wraps his hands in a very old heating pad when his rheumatoid arthritis is bad and leaves them on his lap - skin is a bit dry but he does not have any discomfort in the area - he states he will apply olive oil - this is reasonable         Discharge Exam: Vitals:   04/06/18 2142 04/07/18 0516  BP: 110/65 (!) 147/76  Pulse: 77 70  Resp: 18 19  Temp: 97.8 F (36.6 C) (!) 97.5 F (36.4 C)  SpO2: 97% 98%   Vitals:   04/06/18 0423 04/06/18 1244 04/06/18 2142 04/07/18 0516  BP: (!) 147/76 112/64 110/65 (!) 147/76  Pulse: 75 83 77 70  Resp: 16 19 18 19   Temp: (!) 97.4 F (36.3 C) 98.2 F (36.8 C) 97.8 F (36.6 C) (!) 97.5 F (36.4 C)  TempSrc: Oral Oral Oral Oral  SpO2: 97% 100% 97% 98%  Weight: 78.6 kg (173 lb 4.5 oz)   78.9 kg (173 lb 15.1 oz)  Height:        General: Pt is alert, awake, not in acute distress- herd of hearing Cardiovascular: RRR, S1/S2 +, no rubs, no gallops Respiratory: CTA  bilaterally, no wheezing, no rhonchi Abdominal: Soft, NT, ND, bowel sounds + Extremities: no edema, no cyanosis Skin: see rash above   Discharge Instructions  Discharge Instructions    Diet general   Complete by:  As directed    Regular diet   Increase activity slowly   Complete by:  As directed      Allergies as of 04/07/2018   No Known Allergies     Medication List    TAKE these medications   CALCIUM-VITAMIN D PO Take 1,000 mg by mouth daily.   carvedilol 6.25 MG tablet Commonly known as:  COREG TAKE 1 TABLET TWICE A DAY WITH A MEAL.   Co Q-10 100 MG Caps Take 100 mg by mouth daily.    donepezil 10 MG tablet Commonly known as:  ARICEPT Take 10 mg by mouth at bedtime.   feeding supplement (ENSURE ENLIVE) Liqd Take 237 mLs by mouth 2 (two) times daily between meals.   fish oil-omega-3 fatty acids 1000 MG capsule Take 1 g by mouth daily.   FLUoxetine 20 MG capsule Commonly known as:  PROZAC Take 20 mg by mouth daily.   folic acid 212 MCG tablet Commonly known as:  FOLVITE Take 400 mcg by mouth 2 (two) times daily.   magnesium oxide 400 MG tablet Commonly known as:  MAG-OX Take 400 mg by mouth daily.   mesalamine 1.2 g EC tablet Commonly known as:  LIALDA Take 4.8 g by mouth daily with breakfast.   methotrexate 2.5 MG tablet Commonly known as:  RHEUMATREX Take 8 tablets by mouth Once a week. Wednesday Take 8 tablets=20 mg.   nitroGLYCERIN 0.4 MG SL tablet Commonly known as:  NITROSTAT Place 0.4 mg under the tongue every 5 (five) minutes as needed. Chest pain   polyethylene glycol packet Commonly known as:  MIRALAX / GLYCOLAX Take 17 g by mouth daily as needed for mild constipation.   pravastatin 40 MG tablet Commonly known as:  PRAVACHOL Take 1 tablet (40 mg total) by mouth at bedtime.   ranitidine 300 MG capsule Commonly known as:  ZANTAC Take 300 mg by mouth At bedtime.   senna 8.6 MG Tabs tablet Commonly known as:  SENOKOT Take 1 tablet (8.6 mg total) by mouth 2 (two) times daily.   tamsulosin 0.4 MG Caps capsule Commonly known as:  FLOMAX Take 1 capsule by mouth daily.   traMADol 50 MG tablet Commonly known as:  ULTRAM Take 50 mg by mouth every 6 (six) hours as needed for moderate pain or severe pain.   Vitamin A & D 10000-400 units Caps Take 1 tablet by mouth daily.       No Known Allergies   Procedures/Studies:    Ct Abdomen Pelvis W Contrast  Result Date: 04/04/2018 CLINICAL DATA:  Nausea and vomiting for the past 2 days. History of dementia. EXAM: CT ABDOMEN AND PELVIS WITH CONTRAST TECHNIQUE: Multidetector CT imaging of  the abdomen and pelvis was performed using the standard protocol following bolus administration of intravenous contrast. CONTRAST:  191m ISOVUE-300 IOPAMIDOL (ISOVUE-300) INJECTION 61% COMPARISON:  CT abdomen dated 07/03/2011. FINDINGS: Lower chest: Bibasilar atelectasis and/or fibrosis. Hepatobiliary: Gallbladder is mildly distended but otherwise unremarkable. No focal abnormality within the liver. No bile duct dilatation seen. Pancreas: Partially infiltrated with fat but otherwise unremarkable. Spleen: Normal in size without focal abnormality. Adrenals/Urinary Tract: Adrenal glands appear normal. LEFT kidney is somewhat atrophic/scarred but otherwise unremarkable without mass, stone or hydronephrosis. RIGHT kidney is unremarkable without mass, stone  or hydronephrosis. No perinephric inflammation bilaterally. No ureteral or bladder calculi seen. Bladder appears normal. Stomach/Bowel: No large or small bowel dilatation. No bowel wall thickening or evidence of bowel wall inflammation seen. Appendix is normal. Stomach is unremarkable. Vascular/Lymphatic: Aortic atherosclerosis. No enlarged abdominal or pelvic lymph nodes. Reproductive: Prostate is unremarkable. Other: No free fluid or abscess collections seen. No free intraperitoneal air. Musculoskeletal: Degenerative changes of the thoracolumbar spine, moderate in degree. No acute or suspicious osseous finding. IMPRESSION: 1. No acute findings within the abdomen or pelvis. No free fluid or inflammatory change seen. No bowel obstruction. No evidence of acute solid organ abnormality. No evidence of pyelonephritis. No renal or ureteral calculi. 2. Aortic atherosclerosis. 3. Additional chronic/incidental findings detailed above. Electronically Signed   By: Franki Cabot M.D.   On: 04/04/2018 18:56   US Abdomen Limited  Result Date: 04/04/2018 CLINICAL DATA:  Vomiting for 1 day, elevated lipase. EXAM: ULTRASOUND ABDOMEN LIMITED RIGHT UPPER QUADRANT COMPARISON:  CT  abdomen from earlier same day. FINDINGS: Gallbladder: Gallbladder is mildly distended but otherwise unremarkable. No gallbladder wall thickening, pericholecystic fluid or other signs of acute cholecystitis. No gallstones seen. No sonographic Murphy's sign elicited during the exam. Common bile duct: Diameter: 5 mm Liver: At least mildly echogenic and difficult to penetrate suggesting fatty infiltration. No focal mass or lesion demonstrated. Portal vein is patent on color Doppler imaging with normal direction of blood flow towards the liver. IMPRESSION: 1. No acute findings. No evidence of cholecystitis. No gallstones seen. No bile duct dilatation. 2. Probable fatty infiltration of the liver. Electronically Signed   By: Franki Cabot M.D.   On: 04/04/2018 21:30     The results of significant diagnostics from this hospitalization (including imaging, microbiology, ancillary and laboratory) are listed below for reference.     Microbiology: No results found for this or any previous visit (from the past 240 hour(s)).   Labs: BNP (last 3 results) No results for input(s): BNP in the last 8760 hours. Basic Metabolic Panel: Recent Labs  Lab 04/04/18 1605 04/04/18 2331 04/05/18 0407 04/06/18 0354  NA 131*  --  135 136  K 4.1  --  3.9 3.7  CL 98*  --  101 102  CO2 23  --  24 24  GLUCOSE 128*  --  118* 115*  BUN 10  --  10 10  CREATININE 0.73  --  0.75 0.70  CALCIUM 8.6*  --  8.5* 8.5*  MG  --  1.8  --  1.9  PHOS  --  2.8  --  2.6   Liver Function Tests: Recent Labs  Lab 04/04/18 1605 04/05/18 0407 04/06/18 0354  AST 27 16 19   ALT 22 16* 15*  ALKPHOS 84 75 73  BILITOT 2.1* 1.7* 1.0  PROT 7.4 6.6 6.3*  ALBUMIN 3.3* 2.9* 2.6*   Recent Labs  Lab 04/04/18 1605 04/05/18 0407 04/06/18 0354  LIPASE 163* 152* 36   No results for input(s): AMMONIA in the last 168 hours. CBC: Recent Labs  Lab 04/04/18 1605 04/05/18 0407 04/06/18 0354  WBC 11.5* 9.5 6.4  NEUTROABS  --   --  4.4  HGB  14.4 13.1 12.7*  HCT 40.8 38.7* 37.7*  MCV 90.5 91.7 91.3  PLT 275 260 233   Cardiac Enzymes: No results for input(s): CKTOTAL, CKMB, CKMBINDEX, TROPONINI in the last 168 hours. BNP: Invalid input(s): POCBNP CBG: Recent Labs  Lab 04/05/18 0751 04/06/18 0738 04/07/18 0754  GLUCAP 168* 121* 87  D-Dimer No results for input(s): DDIMER in the last 72 hours. Hgb A1c No results for input(s): HGBA1C in the last 72 hours. Lipid Profile Recent Labs    04/04/18 2331  TRIG 74   Thyroid function studies No results for input(s): TSH, T4TOTAL, T3FREE, THYROIDAB in the last 72 hours.  Invalid input(s): FREET3 Anemia work up No results for input(s): VITAMINB12, FOLATE, FERRITIN, TIBC, IRON, RETICCTPCT in the last 72 hours. Urinalysis    Component Value Date/Time   COLORURINE YELLOW 04/04/2018 1543   APPEARANCEUR CLEAR 04/04/2018 1543   LABSPEC 1.013 04/04/2018 1543   PHURINE 6.0 04/04/2018 1543   GLUCOSEU NEGATIVE 04/04/2018 1543   HGBUR SMALL (A) 04/04/2018 1543   BILIRUBINUR NEGATIVE 04/04/2018 1543   KETONESUR NEGATIVE 04/04/2018 1543   PROTEINUR NEGATIVE 04/04/2018 1543   UROBILINOGEN 0.2 11/05/2014 0303   NITRITE NEGATIVE 04/04/2018 1543   LEUKOCYTESUR NEGATIVE 04/04/2018 1543   Sepsis Labs Invalid input(s): PROCALCITONIN,  WBC,  LACTICIDVEN Microbiology No results found for this or any previous visit (from the past 240 hour(s)).   Time coordinating discharge in minutes: 65  SIGNED:   Debbe Odea, MD  [facility] 04/07/2018, 10:33 AM Pager   If 7PM-7AM, please contact night-coverage [email or web address] Password [email or web address]

## 2018-04-07 NOTE — Care Management Note (Signed)
Case Management Note  Patient Details  Name: Anthony [name] MRN: [id number] Date of Birth: 1934/03/13  Subjective/Objective:                    Action/Plan: spoke with Pt and daughter at bedside concerning discharge plans. Pt selected to go home with [facility]. Pt had no preference for HH. Referral given to [place].    Expected Discharge Date:  04/07/18               Expected Discharge Plan:  [place]  In-House Referral:  Clinical Social Work  Discharge planning Services     Post Acute Care Choice:    Choice offered to:  Patient, Adult Children  DME Arranged:    DME Agency:     HH Arranged:  PT, OT HH Agency:  [place]  Status of Service:  Completed, signed off  If discussed at [place] of Stay Meetings, dates discussed:    Additional CommentsPurcell Mouton, RN 04/07/2018, 1:27 PM

## 2018-04-07 NOTE — Consult Note (Addendum)
   [facility] CM Inpatient Consult   04/07/2018  Anthony [name] 14-Feb-1934 [id number]    Patient screened for [place] Management program. Spoke with inpatient LCSW made aware that family has elected to take patient home.   Went to bedside to speak with Anthony [name] and (daughter) Anthony [name]. Discussed [facility] Care Management services. Both daughter and patient are agreeable. [facility] Care Management written consent obtained. [facility] Care Management folders provided.   Anthony [name] lives with his wife who has dementia.  His daughter Anthony [name] lives locally and is primary contact and caregiver.  Daughter Anthony [name] indicates Anthony [name] may need assist with transportation to MD appointments.  Confirmed Primary Care MD is Dr. Inda Merlin [facility] is listed as doing transition of care calls). Daughter Anthony [name] states she lives a hour away and that her sister Anthony [name] should be contacted post discharge at [phone]. Anthony [name] is agreeable to this.   Discussed [facility] Care Management referral for CHF with [facility] RNCM and [facility] LCSW for transportation and community resources. Explained [facility] Care Management will not interfere or replace home health.   Made inpatient RNCM aware [facility] Care Management to follow post discharge.   Anthony [name] is slated for discharge today.     Marthenia Rolling, MSN-Ed, RN,BSN [facility] Liaison [phone]

## 2018-04-07 NOTE — Progress Notes (Signed)
Physical Therapy Treatment Patient Details Name: Anthony [name] MRN: [id number] DOB: 27-May-1934 Today's Date: 04/07/2018    History of Present Illness 84 y.o. male with medical history significant for HFrEF (EF 45% in 2015), CAD, Crohn's disease on mesalamine, RA on methotrexate, Alzheimer's dementia, HTN and BPH and admitted with nausea, vomiting, abdominal pain and diagnosed with acute pancreatitis    PT Comments    Pt assisted with ambulating in hallway and performed a few exercises seated in recliner.  Pt's mobility improved today however would still recommend SNF upon d/c in preparation for more independence upon return home with spouse.  Daughter present and reports spouse more agreeable to HHPT upon pt's return home as well.    Follow Up Recommendations  SNF     Equipment Recommendations  None recommended by PT    Recommendations for Other Services       Precautions / Restrictions Precautions Precautions: Fall Restrictions Weight Bearing Restrictions: No    Mobility  Bed Mobility Overal bed mobility: Needs Assistance Bed Mobility: Supine to Sit     Supine to sit: Min guard     General bed mobility comments: increased time and effort however did not require physical assist today  Transfers Overall transfer level: Needs assistance Equipment used: Rolling walker (2 wheeled) Transfers: Sit to/from Stand Sit to Stand: Min guard         General transfer comment: verbal cues for forward flexion and hand placement  Ambulation/Gait Ambulation/Gait assistance: Min guard Ambulation Distance (Feet): 120 Feet Assistive device: Rolling walker (2 wheeled) Gait Pattern/deviations: Trunk flexed;Step-through pattern     General Gait Details: more reciprocal gait pattern observed today, pt reports feeling better, tolerated improved distance, appears more steady with RW   Stairs             Wheelchair Mobility    Modified Rankin (Stroke Patients Only)        Balance                                            Cognition Arousal/Alertness: Awake/alert Behavior During Therapy: WFL for tasks assessed/performed Overall Cognitive Status: History of cognitive impairments - at baseline                                        Exercises General Exercises - Lower Extremity Ankle Circles/Pumps: AROM;10 reps;Seated Long Arc Quad: AROM;10 reps;Both;Seated Hip ABduction/ADduction: AROM;10 reps;Both;Seated Hip Flexion/Marching: AROM;10 reps;Both;Seated    General Comments        Pertinent Vitals/Pain Pain Assessment: No/denies pain    Home Living                      Prior Function            PT Goals (current goals can now be found in the care plan section) Progress towards PT goals: Progressing toward goals    Frequency    Min 2X/week      PT Plan Current plan remains appropriate    Co-evaluation              AM-PAC PT "6 Clicks" Daily Activity  Outcome Measure  Difficulty turning over in bed (including adjusting bedclothes, sheets and blankets)?: A Lot Difficulty moving from lying on back to sitting  on the side of the bed? : A Lot Difficulty sitting down on and standing up from a chair with arms (e.g., wheelchair, bedside commode, etc,.)?: A Lot Help needed moving to and from a bed to chair (including a wheelchair)?: A Little Help needed walking in hospital room?: A Little Help needed climbing 3-5 steps with a railing? : A Lot 6 Click Score: 14    End of Session Equipment Utilized During Treatment: Gait belt Activity Tolerance: Patient tolerated treatment well Patient left: in chair;with chair alarm set;with call bell/phone within reach;with family/visitor present Nurse Communication: Mobility status PT Visit Diagnosis: Other abnormalities of gait and mobility (R26.89)     Time: 9390-3009 PT Time Calculation (min) (ACUTE ONLY): 18 min  Charges:  $Gait Training:  8-22 mins                    G Codes:      Carmelia Bake, PT, DPT 04/07/2018 Pager: [phone]  York Ram E 04/07/2018, 10:49 AM

## 2018-04-07 NOTE — Clinical Social Work Note (Addendum)
Daughter and pt discussed plan with CSW again after pt participated in PT session this morning. They were encouraged by his progress and feel they would like to have pt come directly home (with home health) rather than SNF. Attending and CM aware of updated plan.   Clinical Social Work Assessment  Patient Details  Name: Anthony [name] MRN: [id number] Date of Birth: 20-Oct-1934  Date of referral:  04/07/18               Reason for consult:  Facility Placement                Permission sought to share information with:  Family Supports Permission granted to share information::  Yes, Verbal Permission Granted  Name::     wife and 2 daughters [phone]  Agency::     Relationship::     Contact Information:     Housing/Transportation Living arrangements for the past 2 months:  Single Family Home Source of Information:  Patient, Adult Children Patient Interpreter Needed:  None Criminal Activity/Legal Involvement Pertinent to Current Situation/Hospitalization:  No - Comment as needed Significant Relationships:  None Lives with:  Spouse Do you feel safe going back to the place where you live?  Yes Need for family participation in patient care:  Yes (Comment)(pt with dementia)  Care giving concerns:  Pt admitted from home where he resides with his wife. Daughters report that wife and daughters care for pt at baseline- he can ambulate independently but due to dementia/Alzheimer's needs assistance with daily care and hygiene. Daughters report some family dynamics issues which are causing them to consider having pt come stay with one of his daughters rather than home with wife in near future. Currently admitted for acute pancreatitis.   Social Worker assessment / plan:  CSW consulted to assist with disposition planning- SNF recommended. Met with pt and daughter at bedside and other daughter participated via phone (number above, primary contact). They are in agreement to pursue SNF, state they  do not anticipate a long stay needed as pt is progressing with PT while inpatient.  Daughter requested [facility] and CSW made referral- pt accepted and [employer] authorized [phone] 4 days.   Plan: pursuing SNF for ST rehab at DC. Pending medical stability for DC and following for any changes in pt's needs.   Employment status:  Retired Forensic scientist:  Managed Medicare([employer]) PT Recommendations:  [place] / Referral to community resources:  [place]  Patient/Family's Response to care:  Pt engaged but does not remark, daughters appreciative of care  Patient/Family's Understanding of and Emotional Response to Diagnosis, Current Treatment, and Prognosis:  Pt conversed but does not directly demonstrate understanding of treatment. Emotionally calm and pleasant. Daughters demonstrate good understanding of treatment received by describing hospital course to [place]- are good historians of pt's needs. Emotionally daughters admit being somewhat concerned about pt being cared for at home but are proactive in anticipating changes which may be needed.   Emotional Assessment Appearance:  Appears stated age Attitude/Demeanor/Rapport:  Engaged Affect (typically observed):  Calm Orientation:  Oriented to Self, Oriented to Place, Oriented to  Time Alcohol / Substance use:  Not Applicable Psych involvement (Current and /or in the community):  No (Comment)  Discharge Needs  Concerns to be addressed:  Decision making concerns, Discharge Planning Concerns Readmission within the last 30 days:  No Current discharge risk:  Dependent with Mobility Barriers to Discharge:  Continued Medical Work up  Nila Nephew, LCSW 04/07/2018, 10:31 AM  [phone]

## 2018-04-07 NOTE — Progress Notes (Signed)
Pt's daughter changed and asked for [facility]. Referral given to in house rep.

## 2018-04-07 NOTE — Progress Notes (Signed)
Patient and his family given discharge, follow up, and medication instructions, verbalized understanding, IV removed, personal belongings with patient, family to transport home

## 2018-04-08 ENCOUNTER — Other Ambulatory Visit: Payer: Self-pay | Admitting: *Deleted

## 2018-04-08 NOTE — Patient Outreach (Signed)
[facility]) Care Management  04/08/2018  Anthony [name] 1934/04/22 [id number]   Covering for assigned care manager, L. Zigmund Daniel.  Referral received from hospital liaison as member was recently admitted to hospital, diagnosed with pancreatitis.  He was discharged home yesterday.  Per chart, he also has history of heart failure, hypertension, sleep apnea, and Crohn's.  Noted per referral contact person is daughter [name].  Call placed to [facility], member's identity verified.  This care manager explained purpose of call, [facility] services explained.  She state she was not at the bedside when hospital liaison introduced services but instead her sister was.  She state member will have home health involved, hoping to hear from them within the next 24 hours.  Difference between home health and [facility] explained, but she still would like to wait until assessment from home health team is done to determine what member's additional needs are.    Will update covering care manager to follow up next week.  Valente David, [place], MSN [place] [phone]

## 2018-04-11 ENCOUNTER — Other Ambulatory Visit: Payer: Self-pay | Admitting: *Deleted

## 2018-04-11 ENCOUNTER — Other Ambulatory Visit: Payer: Self-pay

## 2018-04-11 NOTE — Progress Notes (Signed)
[facility] was called concerning pt's start of care spoke with [name], RN [phone] in house rep and a call to the pt and family.

## 2018-04-11 NOTE — Patient Outreach (Signed)
[facility]) Care Management  04/11/2018  Anthony [name] Jul 21, 1934 [id number]  BSW contacted patients daughter, [name], as indicated on referral. HIPAA identifiers verified. BSW introduced self as well as [facility] Care Management services. BSW discussed referral for community resources and transportation. Anthony [name] stated "I'm really not sure what we need".   Patient discharged from hospital on 04/07/18. Anthony [name] expected patient to discharge to a SNF for rehabilitation. However, patients other daughter declined SNF stating patient could be managed at home. During phone call Anthony [name] stated she is who is to make decisions regarding Anthony fathers care. Anthony [name] indicated Anthony sister is bipolar and is not patients point of contact.   Anthony [name] states patient not managing well. Patient has had a fall over the weekend. Patient having difficulty transfering and ambulating safely. Anthony [name] stated home health services have not started.  BSW obtained verbal permission to contact [facility] to follow-up on patients referral status.  Anthony [name] disclosed that Anthony fathers Alzheimer's leads to difficulty in completion of ADL's indicating before hospitalization patient went "four months" without a bath due to refusal. BSW discussed challenges of Alzheimer's and discussed utilization of redirection when attempting to assist with ADL's. Patient over the income limit to qualify for Medicaid or CAP services. BSW discussed adults day options versus private duty in-home aide. BSW to mail [name] resources for both. BSW educated [name] on in-home aide options through home health while patient receives physical therapy. Anthony [name] communicated understanding of options and plans to ask if patients qualifies for an in-home aide to assist with ADL's while receiving therapy.  BSW discussed referral to [employer] [facility] [place] for [employer] due to both patient and wife's inability  to safely cook. Although this is a needed service, referral was declined due to patients wife "throwing them away" in the past when they were approved for this community resource. Anthony [name] stated that Anthony [name] has beginning stages of dementia and refuses to eat mobile meals due to "not liking them". Anthony [name] currently providing meals for both patient and his wife.  BSW spoke about possible transportation resources for [facility]. [name] happy to hear there are options available but not interested in a referral at this time. Patient currently ambulates with a walker and can not safely exit home due to steps. Anthony [name] hoping he will regain strength with PT services. BSW mentioned options throughout the community to have ramps built if needed.   At this time Anthony [name] is overwhelmed and unable to establish what, if any, resources she would like assistance with. BSW to mail resources to Anthony home address. BSW to call [name] in two weeks to verify resources received. At that time, BSW will assist with resources as needed. BSW gave direct contact information and asked that if  assistance is needed in the next two weeks to contact this BSW directly. BSW spoke with [name] about plan and she is in agreement.  After ending call with patients daughter, BSW obtained contact information for [facility], spoke with [name] in the [place] location. BSW communicated with [name] [name]'s concerns and contact information. BSW recevied a call back from [facility] stating she was able to speak with patients daughter, [name]. Patients start of care is scheduled for Tuesday afternoon. BSW discussed patients need for a home health aide with [name] as well.   Daneen Schick, BSW, CDP Social Worker Cell # [phone]  [email or web address]

## 2018-04-11 NOTE — Patient Outreach (Addendum)
[place] [facility]) Care Management  04/11/2018  Anthony [name] February 29, 1934 [id number]  Initial telephone assessment   Referral received: 04/07/18 Referral from : [facility] hospital Liaison  Referral reason : Hospital admission 5/13-5/16 Acute Pancreatitis.  Insurance : [employer]  Chart reviewed : Patient is 84 year old male with PMHx that includes but not limited to BPH, Chrons Disease, HTN, CHF,Alzheimer's Dementia   Transition of care to be completed by PCP office  Successful outreach call to patient contact , daughter Anthony [name], HIPAA information was verified, explained purpose of the call.   Daughter discussed patient recent hospital with pancreatitis tolerating diet and fluids  at home, patient is drinking ensure as a supplement. Daughter states patient does not complain of having pain, no vomiting noted, patient has chronic diarrhea.  Daughter discussed that patient lives at home with his wife, that is able to assist with warming food in microwave,.Daughter Anthony [name] discussed she helps with bringing prepared meals to home,unsure at times if wife helps patient with getting meals/food, they at times just eat donuts for breakfast. Daughter discussed concern patients' wife may have some Dementia.   Daughter discussed patient is had a fall over the weekend, states he forgot how to use handle on side of recliner to let foot rest down and had a fall getting out of chair with foot rest up. Daughter reports patient did not have an injury with fall, his wife called EMS for assistance with getting patient up from the floor, states that is not the first time EMS has been to home to assist with patient or wife. Daughter reports patient using rolling walker for mobility in home. Discussed with daughter notifying MD of increased falls or injury with fall.   Daughter discussed appreciation of information resources provided by [facility] , social worker Anthony [name] on today, she discussed being  overwhelmed with patient increasing needs as she doesn't live  nearby.   Daughter manages patient medication filling daily pill organizer, denies any cost concerns. Patient was recently discharged from hospital and all medications have been reviewed.  Daughter is agreeable to [facility] care management services and Transition of care program , and outreaches explained Daughter states [facility] has visit scheduled at home on 5/21 for assessment, physical therapy  and plan to include bath aide with services.  Patient has PCP in the next week.   Plan Will update  assigned care manager, for follow up call in the next week as part of transition of care.  Provided [facility] care management main office, 24 hours nurse line, my contact information.  Will send PCP barrier letter.   THN CM Care Plan Problem One     Most Recent Value  Care Plan Problem One  Risk for readmission , recent hospital admission related to Acute Pancreatitis .   Role Documenting the Problem One  Care Management Coordinator  Care Plan for Problem One  Active  THN Long Term Goal   Patient will not experience a hospital admission in the next 31 days   THN Long Term Goal Start Date  04/11/18  Interventions for Problem One Long Term Goal  Advised daughter related importance of taking medication as prescribed, Reviewed symptoms of Pancreatitis to be aware of  abdominal pain , nausea, vomiting , fever and notifying MD sooner.   THN CM Short Term Goal #1   Patient/Daughter will reports attending all medical appointments in the next 14 days   THN CM Short Term Goal #1  Start Date  04/12/18  Interventions for Short Term Goal #1  Reviewed upcoming medical appointments,  taking medication bottle or list to visit, PCP visit on 5/30 discussed transportaiton  THN CM Short Term Goal #2   Over the next 30 days patient will report maintaining adequate nutriiton intake .   THN CM Short Term Goal #2 Start Date  04/12/18  Interventions for Short  Term Goal #2  Discussed with daughter importance of nutrition and fluid intake as tolerated , encouraged continued use of ensure as supplement ,small meals        Anthony Draft, RN, [place] Management Coordinator  [phone]- Mobile [phone]- [place]

## 2018-04-12 DIAGNOSIS — I10 Essential (primary) hypertension: Secondary | ICD-10-CM | POA: Diagnosis not present

## 2018-04-12 DIAGNOSIS — R269 Unspecified abnormalities of gait and mobility: Secondary | ICD-10-CM | POA: Diagnosis not present

## 2018-04-12 DIAGNOSIS — I509 Heart failure, unspecified: Secondary | ICD-10-CM | POA: Diagnosis not present

## 2018-04-12 DIAGNOSIS — I2510 Atherosclerotic heart disease of native coronary artery without angina pectoris: Secondary | ICD-10-CM | POA: Diagnosis not present

## 2018-04-12 DIAGNOSIS — M6281 Muscle weakness (generalized): Secondary | ICD-10-CM | POA: Diagnosis not present

## 2018-04-12 DIAGNOSIS — G309 Alzheimer's disease, unspecified: Secondary | ICD-10-CM | POA: Diagnosis not present

## 2018-04-12 DIAGNOSIS — K8500 Idiopathic acute pancreatitis without necrosis or infection: Secondary | ICD-10-CM | POA: Diagnosis not present

## 2018-04-13 ENCOUNTER — Other Ambulatory Visit: Payer: Self-pay | Admitting: *Deleted

## 2018-04-13 NOTE — Patient Outreach (Signed)
[place] [facility]) Care Management  04/13/2018  Anthony [name] 1934-10-03 [id number]   EMMI red alert:for 04/12/18  Dashboard reads : patient answers yes to question of sad/anxious/hopeless.   Placed call to patient contact person , [name], daughter, HIPAA verified, explained reason for the call and EMMI program.  Daughter states she was with patient on yesterday, she described patient as being frustrated with slow progress with mobility.  She discussed home health visit on yesterday with therapist. Daughter plans to visit patient again on tomorrow and home health visit also planned.   Daughter denies any new concern at this time.   Plan  Assigned care manager for follow up on the next week for transition of care, encouraged notify this care manager is new concerns .  [name], RN, [place] Management Coordinator  [phone]- Mobile [phone]- Toll Free Main Office

## 2018-04-16 DIAGNOSIS — G309 Alzheimer's disease, unspecified: Secondary | ICD-10-CM | POA: Diagnosis not present

## 2018-04-16 DIAGNOSIS — I509 Heart failure, unspecified: Secondary | ICD-10-CM | POA: Diagnosis not present

## 2018-04-16 DIAGNOSIS — R269 Unspecified abnormalities of gait and mobility: Secondary | ICD-10-CM | POA: Diagnosis not present

## 2018-04-16 DIAGNOSIS — I2510 Atherosclerotic heart disease of native coronary artery without angina pectoris: Secondary | ICD-10-CM | POA: Diagnosis not present

## 2018-04-16 DIAGNOSIS — K8500 Idiopathic acute pancreatitis without necrosis or infection: Secondary | ICD-10-CM | POA: Diagnosis not present

## 2018-04-16 DIAGNOSIS — M6281 Muscle weakness (generalized): Secondary | ICD-10-CM | POA: Diagnosis not present

## 2018-04-16 DIAGNOSIS — I10 Essential (primary) hypertension: Secondary | ICD-10-CM | POA: Diagnosis not present

## 2018-04-20 ENCOUNTER — Other Ambulatory Visit: Payer: Self-pay | Admitting: *Deleted

## 2018-04-20 ENCOUNTER — Encounter: Payer: Self-pay | Admitting: *Deleted

## 2018-04-20 NOTE — Patient Outreach (Signed)
[facility]) Care Management  04/20/2018  TAZ VANNESS [birth year] [id number]    Telephone Assessment (Unsuccessful)  RN attempted outreach call for [facility] services however unsuccessful but able to leave a HIPAA approved voice message requesting a call back. Will sent outreach letter accordingly.  [name], RN Care Management Coordinator [place] Office [phone]

## 2018-04-21 DIAGNOSIS — K859 Acute pancreatitis without necrosis or infection, unspecified: Secondary | ICD-10-CM | POA: Diagnosis not present

## 2018-04-22 ENCOUNTER — Other Ambulatory Visit: Payer: Self-pay | Admitting: *Deleted

## 2018-04-22 NOTE — Patient Outreach (Signed)
[facility]) Care Management  04/22/2018  Anthony [name] 24-Feb-1934 [id number]   Telephone Assessment:  RN spoke with pt who provided permission to speak with his spouse Anthony [name]) and/or daughter Anthony [name]). RN introduced the [facility] program and the purpose for today's call. Pt recently discharged via hospital due to pancreatitis and was discharged home with [facility]. RN spoke with the wife and daughter Anthony [name]) who indicated pt is doing well with no acute issues at this time. It was verified that pt followed up with his primary provided on yesterday and had therapy with the [facility] agency. Much discussion on available resources for [employer], [address]",[employer],  Level of care issues related to possible placement if pt requires more care and caregivers are unable to accommodate pt's needs. RN also offered [facility] for community home visits if pt would benefit from a more one-on-one consultation in the home. Currently wife has decline but continues to be receptive to the ongoing weekly calls of inquire. Plan of care discussed with both the wife and daughter with adjusted interventions based upon pt's progress. Will scheduled next week's follow up call accordingly and provided needed community resources based upon today's discussion. No additional inquires or request at this time. Note daughter Anthony [name] has requested all call come to her cell for pt's follow up calls [phone].   [name], RN Care Management Coordinator [place] Office [phone]

## 2018-04-23 DIAGNOSIS — I10 Essential (primary) hypertension: Secondary | ICD-10-CM | POA: Diagnosis not present

## 2018-04-23 DIAGNOSIS — I2510 Atherosclerotic heart disease of native coronary artery without angina pectoris: Secondary | ICD-10-CM | POA: Diagnosis not present

## 2018-04-23 DIAGNOSIS — K8500 Idiopathic acute pancreatitis without necrosis or infection: Secondary | ICD-10-CM | POA: Diagnosis not present

## 2018-04-23 DIAGNOSIS — M6281 Muscle weakness (generalized): Secondary | ICD-10-CM | POA: Diagnosis not present

## 2018-04-23 DIAGNOSIS — R269 Unspecified abnormalities of gait and mobility: Secondary | ICD-10-CM | POA: Diagnosis not present

## 2018-04-23 DIAGNOSIS — I509 Heart failure, unspecified: Secondary | ICD-10-CM | POA: Diagnosis not present

## 2018-04-23 DIAGNOSIS — G309 Alzheimer's disease, unspecified: Secondary | ICD-10-CM | POA: Diagnosis not present

## 2018-04-25 ENCOUNTER — Ambulatory Visit: Payer: Self-pay

## 2018-04-25 ENCOUNTER — Ambulatory Visit: Payer: PPO

## 2018-04-26 ENCOUNTER — Other Ambulatory Visit: Payer: Self-pay

## 2018-04-26 ENCOUNTER — Ambulatory Visit: Payer: Self-pay

## 2018-04-26 NOTE — Patient Outreach (Signed)
[facility]) Care Management  04/26/2018  Anthony [name] 1934/04/02 [id number]   BSW contacted patients daughter, Anthony [name], to follow up on status of mailings. HIPAA identifiers confirmed. [name] confirmed she has received resources in the mail but is still unable to distinguish which, if any, she would like assistance with. [name] states "dad's doing really well we just cant make him bathe". Due to patients Alzheimer's Disease, patient is refusing to bathe due to belief he has already taken a bath.   Patient continues to received PT twice weekly. So far, patient has not been offered aide services through home health. Anthony [name] stated she is letting her sister, Anthony [name], speak with home health in efforts to obtain aide services. [name] is under the impression a home health nurse will assess patient today to determine eligibility of aide services during home health certification.  [name] also states "moms patience has ran thin" when speaking of her father's Alzheimer's Disease. Although patients wife becomes irritated with level of care patient needs, she is not open to paying for any services including an in-home aide or for patient to attend an adult day center. BSW encouraged [name] to continue discussing options with her mother in hopes of alleviating some stress patients wife is enduring.   Patient is in need of dental work due to cavities in patients remaining front teeth. One tooth is broken and patients dentist has referred him to an oral surgeon. [name] estimates $3,000 worth of dental work needed and indicates the work will "drain their savings". The dentist is recommending all teeth be removed and patient be fitted for dentures. Patient currently has a partial plate which he refuses to wear. [name] would like to only pull the broken tooth and await future dental work until cavities begin to be painful due to concerns of patient not wearing  dentures and being unable to eat. However, [name] indicates her sister believes that only pulling one tooth would be "abuse" and has threatened to call APS if patient does not have all teeth pulled at once. BSW spoke with [name] about difficulty in navigating tough decisions for persons living with Alzheimer's due to inability to predict how the patient will respond.  During today's call [name] stated patient currently has a shower chair and grab bars on outside steps as well as in the hallway of the home. Patient is in need of an outdoor ramp in order to safely exit using a walker as well as grab bars in patients shower. BSW called and left messages with [facility] in hopes of referring patient to their home modification programs. BSW also provided patients daughter with contact information to [employer]. BSW explained that this program does not accept referrals from outside sources and likes to speak with the patient/caregiver.  Plan: BSW to follow up with patients daughter in the next two weeks to discuss status of ramp referrals.  Daneen Schick, Arita Miss, CDP Social Worker [phone]

## 2018-04-29 ENCOUNTER — Other Ambulatory Visit: Payer: Self-pay | Admitting: *Deleted

## 2018-04-29 NOTE — Patient Outreach (Signed)
[place] [facility]) Care Management  04/29/2018  Anthony [name] 02/10/1934 [id number]   Transition of care (Successful) RN spoke with pt's daughter (dawn) who indicates pt is doing well with no falls or acute issues. States pt is eating better with an improved appetite and [facility] continue to work with pt with PT services twice weekly. Plan of care discussed related to prevention measures/medical appointments and nutritional improvement.  RN was able to verify pt's attendance to all her medical appointments and has been taking all his medications with sufficient refills and transportation source. Currently the only problems is pt refusal to take a bath. RN encouraged large wipes and daughter indicates this is what they have been using with some success. Plan of care updated with interventions to adjust to pt's progress and offered to continue with ongoing transition of care contacts over the next few weeks with a follow up call next week. Daughter remains receptive as Therapist, sports inquired on any needed resources at this time and strongly encouraged ongoing ahderence to the current plan of care in place. Will follow up next week.  [name], RN Care Management Coordinator [place] Office [phone]

## 2018-04-30 DIAGNOSIS — I2510 Atherosclerotic heart disease of native coronary artery without angina pectoris: Secondary | ICD-10-CM | POA: Diagnosis not present

## 2018-04-30 DIAGNOSIS — I509 Heart failure, unspecified: Secondary | ICD-10-CM | POA: Diagnosis not present

## 2018-04-30 DIAGNOSIS — G309 Alzheimer's disease, unspecified: Secondary | ICD-10-CM | POA: Diagnosis not present

## 2018-04-30 DIAGNOSIS — R269 Unspecified abnormalities of gait and mobility: Secondary | ICD-10-CM | POA: Diagnosis not present

## 2018-04-30 DIAGNOSIS — M6281 Muscle weakness (generalized): Secondary | ICD-10-CM | POA: Diagnosis not present

## 2018-04-30 DIAGNOSIS — I10 Essential (primary) hypertension: Secondary | ICD-10-CM | POA: Diagnosis not present

## 2018-04-30 DIAGNOSIS — K8500 Idiopathic acute pancreatitis without necrosis or infection: Secondary | ICD-10-CM | POA: Diagnosis not present

## 2018-05-10 ENCOUNTER — Other Ambulatory Visit: Payer: Self-pay

## 2018-05-10 ENCOUNTER — Encounter: Payer: Self-pay | Admitting: *Deleted

## 2018-05-10 ENCOUNTER — Other Ambulatory Visit: Payer: Self-pay | Admitting: *Deleted

## 2018-05-10 NOTE — Patient Outreach (Signed)
[place] [facility]) Care Management  05/10/2018  ISMAEEL ARVELO 1934-07-07 [id number]  Successful call placed to patients daughter, [name], on today's date. [name] able to verify HIPAA identifiers. [name] stated her dad is doing "okay". Patient has been discharged from physical therapy. [name] states she and her sister are taking turns being with the patient at home. The patients children are actively looking into options for an in home aide to assist with ADL's. The patients wife continues to not want to pay for services which makes it difficult.   [name] confirmed she has hired someone to [employer] in the patients shower. She has not called [facility] due to knowing "they can afford a ramp, my mom just doesn't want to pay for one. So anyone that we call will look at their finances and say they do not qualify". [name] stated neither the patient nor his spouse are currently in need of a wheelchair so she has decided this is not a priority.   [name] also informed this BSW she has been in contact with a real estate agent to discuss selling the patients home and finding a smaller, more affordable, rental home. [name] states "they can't afford to move into assisted living". [name] states the patient "does not have savings, makes too much for financial assistance, and still owes a lot of money on his home". The patients wife is overwhelmed with being the primary care giver and her patience is "running thin".  BSW inquired if the family has began looking at assisted living facilities. [name] denies actively looking at options due to knowing memory care will cost more than the patient makes. BSW discussed long term care planning with a CSW. [name] agreeable to referral. BSW to update CSW, Nat Christen, with patient referral for long term care planning. BSW to sign off on case as patient will be followed by a CSW.  Daneen Schick, Arita Miss,  CDP Social Worker [phone]

## 2018-05-10 NOTE — Patient Outreach (Signed)
[facility]) Care Management  05/10/2018  Anthony [name] 1934/10/16 [id number]   CSW was able to make initial contact with patient and patient's daughter, Anthony [name] today to perform the phone assessment, as well as assess and assist with social work needs and services.  CSW introduced self, explained role and types of services provided through [place] [facility] ([place] [facility]).  CSW further explained to patient and Anthony [name] that CSW works with Daneen Schick, social work Social worker, also with [place] [facility]. CSW then explained the reason for the call, indicating that Anthony [name] thought that patient would benefit from social work services and resources to assist with arranging for long-term care placement in an assisted living facility that offers memory impaired services and a wander-guard system.  CSW obtained two HIPAA compliant identifiers from patient, which included patient's name and date of birth. CSW spent the majority of the conversation talking with Anthony [name], as patient did not wish to discuss placement, of any kind, with CSW.  Anthony [name] indicated that she would really like to place both patient and her mother, Anthony [name], as they are both experiencing memory deficits and unable to perform all activities of daily living independently.  Anthony [name] believes that her mother will be agreeable, but knows that her father will be very resistant.  CSW agreed to schedule a home visit with patient, wife and Anthony [name] to discuss the entire placement process, as well as to try and convince patient of the benefits to assisted living placement. In the meantime, CSW agreed to have Anthony [name] mail a list of assisted living facilities in [place] to Anthony [name]'s home for her to review.  Anthony [name] has also been encouraged to go and view facilities of interest. CSW will make contact with patient's Primary Care Physician, Dr. Josetta Huddle  to request that an FL-2 Form be completed on both patient and wife.  In addition, CSW will request that an order for a TB Skin Test to be placed, both requirements of assisted living placement.  CSW explained to Anthony [name] that once the completed and signed FL-2 Forms are obtained, CSW will fax out the forms to all assisted living facilities of interest, keeping in mind that patient would greatly benefit from an assisted living facility that offers memory impaired services and has the wander-guard system.  Anthony [name] has been encouraged to go ahead and apply for Long-Term Care Medicaid, for both patient and wife, at the [place].  CSW provided Anthony [name] with all the information she will need to take with her when she goes to apply.  Anthony [name] voiced understanding and was agreeable to this above named plans. THN CM Care Plan Problem One     Most Recent Value  Care Plan Problem One  Level of care issues.  Role Documenting the Problem One  Clinical Social Worker  Care Plan for Problem One  Active  [facility] CM Short Term Goal #1   Patient will meet with CSW for an initial home visit to discuss long-term care placement in an assisted living facility within the next two weeks.  THN CM Short Term Goal #1 Start Date  05/10/18  Interventions for Short Term Goal #1  CSW will meet with patient, patient's wife, and patient's daughter next week to discuss long-term care placement arrangements.  THN CM Short Term Goal #2   Patient will review the list of assisted living facilities  mailed to him by CSW, within the next two weeks.  THN CM Short Term Goal #2 Start Date  05/10/18  Interventions for Short Term Goal #2  CSW will mail patient and patient's daughter a list of assisted living facilities in [facility] that offer memory impaired services.  THN CM Short Term Goal #3  Patient will decide on assisted living facilities of choice and report these findings to CSW within the  next three weeks.  THN CM Short Term Goal #3 Start Date  05/10/18  Interventions for Short Tern Goal #3  CSW will contact patient's primary care physician to request that an FL-2 Form be completed and a TB Skin Test order be placed, both necessary for assisted living placement.    Nat Christen, BSW, MSW, LCSW  Licensed Education officer, [facility]  [address] [place] [address] [place], [place] [address] Physical Address-[address], [place], [place] [address] Toll Free Main # [phone] Fax # [phone] Cell # [phone]  Office # [phone] [email or web address]

## 2018-05-11 ENCOUNTER — Other Ambulatory Visit: Payer: Self-pay | Admitting: *Deleted

## 2018-05-11 ENCOUNTER — Encounter: Payer: Self-pay | Admitting: *Deleted

## 2018-05-11 ENCOUNTER — Ambulatory Visit: Payer: PPO | Admitting: *Deleted

## 2018-05-11 NOTE — Patient Outreach (Signed)
[facility]) Care Management  05/11/2018  Anthony [name] February 09, 1934 [id number]  BSW mailed a list of assisted living facilities located in [place] to the patients daughter, Anthony [name], as requested by [place].  Daneen Schick, Arita Miss, CDP Social Worker [phone]

## 2018-05-11 NOTE — Patient Outreach (Addendum)
[place] [facility]) Care Management  05/11/2018  NILESH STEGALL 1934-02-10 [id number]    Telephone Assessment (Unsuccessful)  RN attempted outreach call today however only able to leave a HIPAA approved voice message requesting a call back. Will continue outreach calls accordingly and verify letter to be sent for notification. Will also communicate with involved LCSW Di Kindle) with update.  [name], RN Care Management Coordinator [place] Office [phone]

## 2018-05-18 ENCOUNTER — Encounter: Payer: Self-pay | Admitting: *Deleted

## 2018-05-20 ENCOUNTER — Other Ambulatory Visit: Payer: Self-pay | Admitting: *Deleted

## 2018-05-20 NOTE — Patient Outreach (Signed)
[facility]) Care Management  05/20/2018  Anthony [name] December 25, 1933 [id number]   CSW was able to meet with patient, patient's wife, Justan Gaede and patient's daughter, [name] today to perform the initial home visit.  Patient and Mrs. Dunnaway admit that they are no longer interested in placement into an assisted living facility, but would rather have someone come into their home to assist with activities of daily living.  CSW agreed to mail them a list of home health agencies, as well as directed them to the following website:  [email or web address].  CSW explained that insurance does not cover the cost of in-home care services, unless patient and Mrs. Kincaid have a long-term care insurance policy that provides coverage.  CSW agreed to follow-up with [name] again on Friday, May 27, 2018 to ensure that she received the packet of resource information, as well as answer any questions that she may have at that time.  [name] voiced understanding and was agreeable to this plan.  THN CM Care Plan Problem One     Most Recent Value  Care Plan Problem One  Level of care issues.  Role Documenting the Problem One  Clinical Social Worker  Care Plan for Problem One  Active  [facility] CM Short Term Goal #1   Patient will meet with CSW for an initial home visit to discuss long-term care placement in an assisted living facility within the next two weeks.  THN CM Short Term Goal #1 Start Date  05/10/18  [facility] CM Short Term Goal #1 Met Date  05/20/18  Interventions for Short Term Goal #1  CSW met with patient, patient's wife and patient's daughter today for the initial home visit.  THN CM Short Term Goal #2   Patient will review the list of assisted living facilities mailed to him by CSW, within the next two weeks.  THN CM Short Term Goal #2 Start Date  05/10/18  [facility] CM Short Term Goal #2 Met Date  05/20/18  Interventions for Short Term Goal #2  Patient has reviewed the list of  assisted living facilities and has decided that he would prefer to remain in his home with home health services arranged.  THN CM Short Term Goal #3  Patient will decide on assisted living facilities of choice and report these findings to CSW within the next three weeks.  THN CM Short Term Goal #3 Start Date  05/10/18  [facility] CM Short Term Goal #3 Met Date  05/20/18  Interventions for Short Tern Goal #3  Patient is no longer wanting to pursue placement in an assisted living facility.  THN CM Short Term Goal #4  Patient will review list of home health agencies and decide on a home health agency of choice, within the next two weeks.  THN CM Short Term Goal #4 Start Date  05/20/18  Interventions for Short Term Goal #4  CSW will mail patient a list of home health agencies for his review.     Nat Christen, BSW, MSW, LCSW  Licensed Education officer, environmental Health System  Mailing [place] [address] [place], [place] [address] Physical Address-[address], [place], [place] [address] Toll Free Main # [phone] Fax # [phone] Cell # [phone]  Office # [phone] [email or web address]

## 2018-05-20 NOTE — Patient Outreach (Addendum)
[facility]) Care Management  05/20/2018  Anthony [name] Jun 25, 1934 [id number]    Transition of care (successful)  RN attempted to reach pt's caregiver daughter today Anthony [name]) and left a HIPAA approved voice message. Will continue to follow up in few weeks as social worker [name] is working with family on possible assisted living (memory unit) with the daughter along with a pending home visit.   RN followed up with update on pt's activities with this discipline for ongoing transition of care call. Note did not wish to have a home visit with discipline. Will continue to follow up accordingly with [facility] services.  ADDENDUM:  RN received a call back to from the pt's daughter Anthony [name]) who indicated she is working with the Education officer, museum who will be visiting today to discuss possible placement for both the pt and the spouse who is not a TH[facility] pt. RN strongly encouraged to consider based upon recent events where the spouse has a fall. Daughter is strongly considering placement at this time due to the amount of time she spends caring for the pt and his spouse. RN review the notes and plan of care discussed along with what to expect with placing the pt in a facility as a couple (perferrable a memory unit). All inquires or request will be present to the involved social worker today as RN will continue to monitor this pt for ongoing needs if appropriate at this level of care in the home. Will follow up in few weeks on progress and continue to monitor this pt.   [name], RN Care Management Coordinator [place] Office [phone]

## 2018-05-23 NOTE — Patient Outreach (Signed)
[facility]) Care Management  05/23/2018  Anthony [name] [birth year] [id number]  BSW mailed patient community resources as requested by [place].  Daneen Schick, BSW, CDP [facility] [phone]

## 2018-05-27 ENCOUNTER — Encounter: Payer: Self-pay | Admitting: *Deleted

## 2018-05-27 ENCOUNTER — Other Ambulatory Visit: Payer: Self-pay | Admitting: *Deleted

## 2018-05-27 NOTE — Patient Outreach (Signed)
[facility]) Care Management  05/27/2018  CINCERE ZORN 05/17/1934 [id number]   CSW made an attempt to try and contact patient's daughter, [name] today to ensure that she received the packet of resource information mailed to patient's home; however, [name] was not available at the time of CSW's call.  A HIPAA compliant message was left for [name] on voicemail.  CSW is currently awaiting a return call.  CSW will make a second outreach attempt within the next 3-4 business days, if a return call is not received from [name] in the meantime.  CSW will also mail an Outreach Letter to patient's home requesting that patient and/or [name] contact CSW if they are still interested in receiving social work services through [place] with Scientist, clinical (histocompatibility and immunogenetics). Nat Christen, BSW, MSW, LCSW  Licensed Education officer, environmental Health System  Mailing [place] [address], [place], [place] [address] Physical Address-[address], [place], [place] [address] Toll Free Main # [phone] Fax # [phone] Cell # [phone]  Office # [phone] [email or web address]

## 2018-06-01 ENCOUNTER — Encounter: Payer: Self-pay | Admitting: *Deleted

## 2018-06-02 ENCOUNTER — Other Ambulatory Visit: Payer: Self-pay | Admitting: *Deleted

## 2018-06-02 NOTE — Patient Outreach (Signed)
[facility]) Care Management  06/02/2018  Anthony [name] 1934/11/01 [id number]   CSW was able to make contact with Anthony [name]'s daughter, [name] today to follow-up regarding social work services and resources for Anthony [name].  Anthony [name] admits that she received the list of home health agencies mailed to her by CSW, but that she will hold off on making home health arrangements at this time, as Anthony [name] is refusing to pay for services out-of-pocket.  CSW explained to Anthony [name] that Anthony [name]'s [employer] Medicare will pay 80% and Anthony [name]'s supplemental policy, through [employer], will pay the additional 20%, for a coverage of 100%.  CSW encouraged Anthony [name] to contact Anthony [name]'s Primary Care Physician, Dr. Josetta Huddle to place the request for home health physical therapy, occupational therapy, nursing and aide services to be ordered.  CSW also agreed to do the same, as well as send documentation of this request.  CSW will perform a case closure on Anthony [name], as all goals of treatment have been met from social work standpoint and no additional social work needs have been identified at this time.  CSW was able to ensure that Anthony [name] has the correct contact information for CSW, encouraging Anthony [name] to contact CSW directly if additional social work needs arise in the near future.  Anthony [name] voiced understanding and was agreeable to this plan.  Anthony [name] also agreed to contact CSW if she has trouble arranging home health services for Anthony [name].  CSW will notify Anthony [name]'s RNCM with [place] [facility], [name] of CSW's plans to close Anthony [name]'s case.  CSW will fax an update to Anthony [name]'s Primary Care Physician, Dr. Josetta Huddle to ensure that they are aware of CSW's involvement with Anthony [name]'s plan of care.    THN CM Care Plan Problem One     Most Recent Value  Care Plan Problem One  Level of care issues.  Role Documenting the Problem One   Clinical Social Worker  Care Plan for Problem One  Active  [facility] CM Short Term Goal #1   Anthony [name] will meet with CSW for an initial home visit to discuss long-term care placement in an assisted living facility within the next two weeks.  THN CM Short Term Goal #1 Start Date  05/10/18  [facility] CM Short Term Goal #1 Met Date  05/20/18  [facility] CM Short Term Goal #2   Anthony [name] will review the list of assisted living facilities mailed to him by CSW, within the next two weeks.  THN CM Short Term Goal #2 Start Date  05/10/18  [facility] CM Short Term Goal #2 Met Date  05/20/18  THN CM Short Term Goal #3  Anthony [name] will decide on assisted living facilities of choice and report these findings to [place] within the next three weeks.  THN CM Short Term Goal #3 Start Date  05/10/18  [facility] CM Short Term Goal #3 Met Date  05/20/18  [facility] CM Short Term Goal #4  Anthony [name] will review list of home health agencies and decide on a home health agency of choice, within the next two weeks.  THN CM Short Term Goal #4 Start Date  05/20/18  [facility] CM Short Term Goal #4 Met Date  06/02/18  Interventions for Short Term Goal #4  CSW will mail Anthony [name] a list of home health agencies for his review.     Nat Christen, BSW, MSW, LCSW  Licensed Education officer, environmental Health System  Mailing Address-[address] [place], [place] [address] Physical Address-[address], [place], [place] [address] Toll Free Main # [phone] Fax # [phone] Cell # [phone]  Office # [phone] [email or web address]

## 2018-06-03 ENCOUNTER — Other Ambulatory Visit: Payer: Self-pay | Admitting: *Deleted

## 2018-06-03 NOTE — Patient Outreach (Signed)
[facility] Care Management  06/03/2018  KINSER FELLMAN [birth year] [id number]    Telephone Assessment (Case Closure)  RN initial spoke with pt's spouse Lake Bells)  And introduced the [facility] program and purpose for today's call. Spouse requested RN to contact the daughter [name] for an update. RN contacted [place] who indicated she has received all the requested information for possible placements to a higher level of care for both the pt and his spouse. Confirmed orders have been called for [facility] for PT/OT and aide services however this process is pending a start date. Daughter maybe interested in some consultation for her wife for [facility] in the future but right now will focus on her father. RN informed the daughter to contact the [facility] office directly with this request and she will be called by one of the nurse for possible community if pt is appropriate for [facility] services. RN received the correct plan of care and verified all goals have been met with no additional needs at this time. RN offered to assist further with any inquires at this time however none presented. Daughter expressed her gratitude for the assistance with the social worker and this RN case manager following up and providing the needed resources.   Based upon the caregiver stress levels RN encouraged caregiver to seek another assistance from other family members to assist or relieve prior to a burnout with caring from her mother and father. Daughter indicated she is the only one to provider the care needed for the pt and his spouse but she would review the information soon and make a decision on possible services. RN informed caregiver that [facility] will remain available is further resources are needed however all goals have been met and pt will be deactivated via [facility] services. Caregiver aware that the pt's primary provider will be notified of pt's disposition with community case management services with [facility]. No additional  inquires or request at this time as this case will be closed.  [name], RN Care Management Coordinator [place] Office [phone]

## 2018-07-04 ENCOUNTER — Telehealth: Payer: Self-pay | Admitting: Internal Medicine

## 2018-07-23 DIAGNOSIS — D649 Anemia, unspecified: Secondary | ICD-10-CM | POA: Diagnosis not present

## 2018-07-23 DIAGNOSIS — I2510 Atherosclerotic heart disease of native coronary artery without angina pectoris: Secondary | ICD-10-CM | POA: Diagnosis not present

## 2018-07-23 DIAGNOSIS — M069 Rheumatoid arthritis, unspecified: Secondary | ICD-10-CM | POA: Diagnosis not present

## 2018-07-23 DIAGNOSIS — I5022 Chronic systolic (congestive) heart failure: Secondary | ICD-10-CM | POA: Diagnosis not present

## 2018-07-23 DIAGNOSIS — N400 Enlarged prostate without lower urinary tract symptoms: Secondary | ICD-10-CM | POA: Diagnosis not present

## 2018-07-23 DIAGNOSIS — I10 Essential (primary) hypertension: Secondary | ICD-10-CM | POA: Diagnosis not present

## 2018-07-23 DIAGNOSIS — E785 Hyperlipidemia, unspecified: Secondary | ICD-10-CM | POA: Diagnosis not present

## 2018-07-23 DIAGNOSIS — F322 Major depressive disorder, single episode, severe without psychotic features: Secondary | ICD-10-CM | POA: Diagnosis not present

## 2018-08-22 DIAGNOSIS — N400 Enlarged prostate without lower urinary tract symptoms: Secondary | ICD-10-CM | POA: Diagnosis not present

## 2018-08-22 DIAGNOSIS — F322 Major depressive disorder, single episode, severe without psychotic features: Secondary | ICD-10-CM | POA: Diagnosis not present

## 2018-08-22 DIAGNOSIS — I5022 Chronic systolic (congestive) heart failure: Secondary | ICD-10-CM | POA: Diagnosis not present

## 2018-08-22 DIAGNOSIS — M069 Rheumatoid arthritis, unspecified: Secondary | ICD-10-CM | POA: Diagnosis not present

## 2018-08-22 DIAGNOSIS — I25119 Atherosclerotic heart disease of native coronary artery with unspecified angina pectoris: Secondary | ICD-10-CM | POA: Diagnosis not present

## 2018-08-22 DIAGNOSIS — E785 Hyperlipidemia, unspecified: Secondary | ICD-10-CM | POA: Diagnosis not present

## 2018-08-22 DIAGNOSIS — I10 Essential (primary) hypertension: Secondary | ICD-10-CM | POA: Diagnosis not present

## 2018-09-07 DIAGNOSIS — I10 Essential (primary) hypertension: Secondary | ICD-10-CM | POA: Diagnosis not present

## 2018-09-07 DIAGNOSIS — M069 Rheumatoid arthritis, unspecified: Secondary | ICD-10-CM | POA: Diagnosis not present

## 2018-09-07 DIAGNOSIS — I2510 Atherosclerotic heart disease of native coronary artery without angina pectoris: Secondary | ICD-10-CM | POA: Diagnosis not present

## 2018-09-07 DIAGNOSIS — N400 Enlarged prostate without lower urinary tract symptoms: Secondary | ICD-10-CM | POA: Diagnosis not present

## 2018-09-07 DIAGNOSIS — I25119 Atherosclerotic heart disease of native coronary artery with unspecified angina pectoris: Secondary | ICD-10-CM | POA: Diagnosis not present

## 2018-09-07 DIAGNOSIS — F322 Major depressive disorder, single episode, severe without psychotic features: Secondary | ICD-10-CM | POA: Diagnosis not present

## 2018-09-07 DIAGNOSIS — I5022 Chronic systolic (congestive) heart failure: Secondary | ICD-10-CM | POA: Diagnosis not present

## 2018-09-07 DIAGNOSIS — I252 Old myocardial infarction: Secondary | ICD-10-CM | POA: Diagnosis not present

## 2018-09-07 DIAGNOSIS — D649 Anemia, unspecified: Secondary | ICD-10-CM | POA: Diagnosis not present

## 2018-09-07 DIAGNOSIS — E785 Hyperlipidemia, unspecified: Secondary | ICD-10-CM | POA: Diagnosis not present

## 2018-09-30 ENCOUNTER — Ambulatory Visit: Payer: PPO | Admitting: Podiatry

## 2018-10-04 ENCOUNTER — Emergency Department (HOSPITAL_COMMUNITY): Payer: PPO

## 2018-10-04 ENCOUNTER — Inpatient Hospital Stay (HOSPITAL_COMMUNITY)
Admission: EM | Admit: 2018-10-04 | Discharge: 2018-10-13 | DRG: 871 | Disposition: A | Discharge status: Skilled Nursing Facility | Payer: PPO | Attending: Internal Medicine | Admitting: Internal Medicine

## 2018-10-04 ENCOUNTER — Other Ambulatory Visit: Payer: Self-pay

## 2018-10-04 ENCOUNTER — Encounter (HOSPITAL_COMMUNITY): Payer: Self-pay

## 2018-10-04 DIAGNOSIS — R1310 Dysphagia, unspecified: Secondary | ICD-10-CM | POA: Diagnosis present

## 2018-10-04 DIAGNOSIS — R000 Tachycardia, unspecified: Secondary | ICD-10-CM | POA: Diagnosis not present

## 2018-10-04 DIAGNOSIS — B370 Candidal stomatitis: Secondary | ICD-10-CM | POA: Diagnosis not present

## 2018-10-04 DIAGNOSIS — R0689 Other abnormalities of breathing: Secondary | ICD-10-CM | POA: Diagnosis not present

## 2018-10-04 DIAGNOSIS — W1830XA Fall on same level, unspecified, initial encounter: Secondary | ICD-10-CM | POA: Diagnosis present

## 2018-10-04 DIAGNOSIS — J9601 Acute respiratory failure with hypoxia: Secondary | ICD-10-CM | POA: Diagnosis not present

## 2018-10-04 DIAGNOSIS — Z87891 Personal history of nicotine dependence: Secondary | ICD-10-CM | POA: Diagnosis not present

## 2018-10-04 DIAGNOSIS — E118 Type 2 diabetes mellitus with unspecified complications: Secondary | ICD-10-CM | POA: Diagnosis not present

## 2018-10-04 DIAGNOSIS — I5022 Chronic systolic (congestive) heart failure: Secondary | ICD-10-CM | POA: Diagnosis present

## 2018-10-04 DIAGNOSIS — R509 Fever, unspecified: Secondary | ICD-10-CM | POA: Diagnosis not present

## 2018-10-04 DIAGNOSIS — R12 Heartburn: Secondary | ICD-10-CM | POA: Diagnosis not present

## 2018-10-04 DIAGNOSIS — Z7952 Long term (current) use of systemic steroids: Secondary | ICD-10-CM

## 2018-10-04 DIAGNOSIS — R0902 Hypoxemia: Secondary | ICD-10-CM

## 2018-10-04 DIAGNOSIS — J9811 Atelectasis: Secondary | ICD-10-CM | POA: Diagnosis not present

## 2018-10-04 DIAGNOSIS — R21 Rash and other nonspecific skin eruption: Secondary | ICD-10-CM | POA: Diagnosis present

## 2018-10-04 DIAGNOSIS — I110 Hypertensive heart disease with heart failure: Secondary | ICD-10-CM | POA: Diagnosis not present

## 2018-10-04 DIAGNOSIS — E860 Dehydration: Secondary | ICD-10-CM | POA: Diagnosis not present

## 2018-10-04 DIAGNOSIS — M069 Rheumatoid arthritis, unspecified: Secondary | ICD-10-CM | POA: Diagnosis not present

## 2018-10-04 DIAGNOSIS — R5381 Other malaise: Secondary | ICD-10-CM | POA: Diagnosis not present

## 2018-10-04 DIAGNOSIS — R531 Weakness: Secondary | ICD-10-CM | POA: Diagnosis not present

## 2018-10-04 DIAGNOSIS — Z8249 Family history of ischemic heart disease and other diseases of the circulatory system: Secondary | ICD-10-CM

## 2018-10-04 DIAGNOSIS — K5090 Crohn's disease, unspecified, without complications: Secondary | ICD-10-CM | POA: Diagnosis present

## 2018-10-04 DIAGNOSIS — Z79899 Other long term (current) drug therapy: Secondary | ICD-10-CM | POA: Diagnosis not present

## 2018-10-04 DIAGNOSIS — F0390 Unspecified dementia without behavioral disturbance: Secondary | ICD-10-CM | POA: Diagnosis present

## 2018-10-04 DIAGNOSIS — A419 Sepsis, unspecified organism: Principal | ICD-10-CM | POA: Diagnosis present

## 2018-10-04 DIAGNOSIS — Z9981 Dependence on supplemental oxygen: Secondary | ICD-10-CM | POA: Diagnosis not present

## 2018-10-04 DIAGNOSIS — E785 Hyperlipidemia, unspecified: Secondary | ICD-10-CM | POA: Diagnosis not present

## 2018-10-04 DIAGNOSIS — E559 Vitamin D deficiency, unspecified: Secondary | ICD-10-CM | POA: Diagnosis not present

## 2018-10-04 DIAGNOSIS — M2550 Pain in unspecified joint: Secondary | ICD-10-CM | POA: Diagnosis not present

## 2018-10-04 DIAGNOSIS — G4733 Obstructive sleep apnea (adult) (pediatric): Secondary | ICD-10-CM | POA: Diagnosis present

## 2018-10-04 DIAGNOSIS — I959 Hypotension, unspecified: Secondary | ICD-10-CM | POA: Diagnosis not present

## 2018-10-04 DIAGNOSIS — R197 Diarrhea, unspecified: Secondary | ICD-10-CM | POA: Diagnosis not present

## 2018-10-04 DIAGNOSIS — I2510 Atherosclerotic heart disease of native coronary artery without angina pectoris: Secondary | ICD-10-CM | POA: Diagnosis not present

## 2018-10-04 DIAGNOSIS — I252 Old myocardial infarction: Secondary | ICD-10-CM | POA: Diagnosis not present

## 2018-10-04 DIAGNOSIS — J84116 Cryptogenic organizing pneumonia: Secondary | ICD-10-CM | POA: Diagnosis not present

## 2018-10-04 DIAGNOSIS — J8489 Other specified interstitial pulmonary diseases: Secondary | ICD-10-CM | POA: Diagnosis not present

## 2018-10-04 DIAGNOSIS — Z66 Do not resuscitate: Secondary | ICD-10-CM | POA: Diagnosis present

## 2018-10-04 DIAGNOSIS — J849 Interstitial pulmonary disease, unspecified: Secondary | ICD-10-CM | POA: Diagnosis not present

## 2018-10-04 DIAGNOSIS — E871 Hypo-osmolality and hyponatremia: Secondary | ICD-10-CM | POA: Diagnosis not present

## 2018-10-04 DIAGNOSIS — I5020 Unspecified systolic (congestive) heart failure: Secondary | ICD-10-CM | POA: Diagnosis not present

## 2018-10-04 DIAGNOSIS — K50119 Crohn's disease of large intestine with unspecified complications: Secondary | ICD-10-CM | POA: Diagnosis not present

## 2018-10-04 DIAGNOSIS — K5010 Crohn's disease of large intestine without complications: Secondary | ICD-10-CM | POA: Diagnosis not present

## 2018-10-04 DIAGNOSIS — R05 Cough: Secondary | ICD-10-CM | POA: Diagnosis not present

## 2018-10-04 DIAGNOSIS — E1165 Type 2 diabetes mellitus with hyperglycemia: Secondary | ICD-10-CM | POA: Diagnosis present

## 2018-10-04 DIAGNOSIS — I7300 Raynaud's syndrome without gangrene: Secondary | ICD-10-CM | POA: Diagnosis not present

## 2018-10-04 DIAGNOSIS — R52 Pain, unspecified: Secondary | ICD-10-CM | POA: Diagnosis not present

## 2018-10-04 DIAGNOSIS — F329 Major depressive disorder, single episode, unspecified: Secondary | ICD-10-CM | POA: Diagnosis not present

## 2018-10-04 DIAGNOSIS — K219 Gastro-esophageal reflux disease without esophagitis: Secondary | ICD-10-CM | POA: Diagnosis present

## 2018-10-04 DIAGNOSIS — M791 Myalgia, unspecified site: Secondary | ICD-10-CM | POA: Diagnosis not present

## 2018-10-04 DIAGNOSIS — I10 Essential (primary) hypertension: Secondary | ICD-10-CM | POA: Diagnosis not present

## 2018-10-04 DIAGNOSIS — K760 Fatty (change of) liver, not elsewhere classified: Secondary | ICD-10-CM | POA: Diagnosis present

## 2018-10-04 DIAGNOSIS — Z7401 Bed confinement status: Secondary | ICD-10-CM | POA: Diagnosis not present

## 2018-10-04 DIAGNOSIS — E7800 Pure hypercholesterolemia, unspecified: Secondary | ICD-10-CM | POA: Diagnosis present

## 2018-10-04 DIAGNOSIS — R739 Hyperglycemia, unspecified: Secondary | ICD-10-CM | POA: Diagnosis not present

## 2018-10-04 DIAGNOSIS — Z515 Encounter for palliative care: Secondary | ICD-10-CM | POA: Diagnosis not present

## 2018-10-04 DIAGNOSIS — Z7189 Other specified counseling: Secondary | ICD-10-CM | POA: Diagnosis not present

## 2018-10-04 DIAGNOSIS — R918 Other nonspecific abnormal finding of lung field: Secondary | ICD-10-CM | POA: Diagnosis not present

## 2018-10-04 DIAGNOSIS — R079 Chest pain, unspecified: Secondary | ICD-10-CM | POA: Diagnosis not present

## 2018-10-04 HISTORY — DX: Crohn's disease, unspecified, without complications: K50.90

## 2018-10-04 LAB — CBC WITH DIFFERENTIAL/PLATELET
ABS IMMATURE GRANULOCYTES: 0.29 10*3/uL — AB (ref 0.00–0.07)
Basophils Absolute: 0 10*3/uL (ref 0.0–0.1)
Basophils Relative: 0 %
EOS ABS: 0.5 10*3/uL (ref 0.0–0.5)
Eosinophils Relative: 5 %
HEMATOCRIT: 41.8 % (ref 39.0–52.0)
Hemoglobin: 13.9 g/dL (ref 13.0–17.0)
Immature Granulocytes: 3 %
LYMPHS ABS: 0.3 10*3/uL — AB (ref 0.7–4.0)
Lymphocytes Relative: 3 %
MCH: 31.6 pg (ref 26.0–34.0)
MCHC: 33.3 g/dL (ref 30.0–36.0)
MCV: 95 fL (ref 80.0–100.0)
MONO ABS: 0.3 10*3/uL (ref 0.1–1.0)
MONOS PCT: 3 %
Neutro Abs: 9.3 10*3/uL — ABNORMAL HIGH (ref 1.7–7.7)
Neutrophils Relative %: 86 %
Platelets: 254 10*3/uL (ref 150–400)
RBC: 4.4 MIL/uL (ref 4.22–5.81)
RDW: 15.9 % — AB (ref 11.5–15.5)
WBC: 10.7 10*3/uL — ABNORMAL HIGH (ref 4.0–10.5)
nRBC: 0 % (ref 0.0–0.2)

## 2018-10-04 LAB — I-STAT TROPONIN, ED: TROPONIN I, POC: 0.02 ng/mL (ref 0.00–0.08)

## 2018-10-04 LAB — COMPREHENSIVE METABOLIC PANEL
ALK PHOS: 69 U/L (ref 38–126)
ALT: 21 U/L (ref 0–44)
ANION GAP: 8 (ref 5–15)
AST: 18 U/L (ref 15–41)
Albumin: 2.8 g/dL — ABNORMAL LOW (ref 3.5–5.0)
BILIRUBIN TOTAL: 1.5 mg/dL — AB (ref 0.3–1.2)
BUN: 14 mg/dL (ref 8–23)
CALCIUM: 8.3 mg/dL — AB (ref 8.9–10.3)
CO2: 24 mmol/L (ref 22–32)
Chloride: 99 mmol/L (ref 98–111)
Creatinine, Ser: 1.04 mg/dL (ref 0.61–1.24)
GFR calc Af Amer: >60 mL/min (ref >60)
GFR calc non Af Amer: >60 mL/min (ref >60)
GLUCOSE: 175 mg/dL — AB (ref 70–99)
Potassium: 4.1 mmol/L (ref 3.5–5.1)
SODIUM: 131 mmol/L — AB (ref 135–145)
TOTAL PROTEIN: 5.7 g/dL — AB (ref 6.5–8.1)

## 2018-10-04 LAB — I-STAT CG4 LACTIC ACID, ED: Lactic Acid, Venous: 1.48 mmol/L (ref 0.5–1.9)

## 2018-10-04 LAB — I-STAT CHEM 8, ED
BUN: 16 mg/dL (ref 8–23)
Calcium, Ion: 1.07 mmol/L — ABNORMAL LOW (ref 1.15–1.40)
Chloride: 98 mmol/L (ref 98–111)
Creatinine, Ser: 1 mg/dL (ref 0.61–1.24)
Glucose, Bld: 175 mg/dL — ABNORMAL HIGH (ref 70–99)
HEMATOCRIT: 42 % (ref 39.0–52.0)
HEMOGLOBIN: 14.3 g/dL (ref 13.0–17.0)
POTASSIUM: 4.2 mmol/L (ref 3.5–5.1)
Sodium: 130 mmol/L — ABNORMAL LOW (ref 135–145)
TCO2: 25 mmol/L (ref 22–32)

## 2018-10-04 LAB — URINALYSIS, ROUTINE W REFLEX MICROSCOPIC
Bilirubin Urine: NEGATIVE
GLUCOSE, UA: NEGATIVE mg/dL
HGB URINE DIPSTICK: NEGATIVE
Ketones, ur: NEGATIVE mg/dL
LEUKOCYTES UA: NEGATIVE
Nitrite: NEGATIVE
PH: 7 (ref 5.0–8.0)
PROTEIN: NEGATIVE mg/dL
Specific Gravity, Urine: 1.004 — ABNORMAL LOW (ref 1.005–1.030)

## 2018-10-04 LAB — INFLUENZA PANEL BY PCR (TYPE A & B)
INFLAPCR: NEGATIVE
Influenza B By PCR: NEGATIVE

## 2018-10-04 LAB — GROUP A STREP BY PCR: GROUP A STREP BY PCR: NOT DETECTED

## 2018-10-04 MED ORDER — MESALAMINE 1.2 G PO TBEC
4.8000 g | DELAYED_RELEASE_TABLET | Freq: Every day | ORAL | Status: DC
  Administered 2018-10-05 – 2018-10-13 (×8): 4.8 g via ORAL
  Filled 2018-10-04 (×9): qty 4

## 2018-10-04 MED ORDER — FAMOTIDINE 20 MG PO TABS
40.0000 mg | ORAL_TABLET | Freq: Every day | ORAL | Status: DC
  Administered 2018-10-05 – 2018-10-12 (×9): 40 mg via ORAL
  Filled 2018-10-04 (×9): qty 2

## 2018-10-04 MED ORDER — SODIUM CHLORIDE 0.9 % IV SOLN
2.0000 g | Freq: Once | INTRAVENOUS | Status: AC
  Administered 2018-10-04: 2 g via INTRAVENOUS
  Filled 2018-10-04: qty 2

## 2018-10-04 MED ORDER — VANCOMYCIN HCL IN DEXTROSE 1-5 GM/200ML-% IV SOLN
1000.0000 mg | Freq: Two times a day (BID) | INTRAVENOUS | Status: DC
  Administered 2018-10-05 – 2018-10-06 (×4): 1000 mg via INTRAVENOUS
  Filled 2018-10-04 (×4): qty 200

## 2018-10-04 MED ORDER — TAMSULOSIN HCL 0.4 MG PO CAPS
0.4000 mg | ORAL_CAPSULE | Freq: Every day | ORAL | Status: DC
  Administered 2018-10-05 – 2018-10-12 (×8): 0.4 mg via ORAL
  Filled 2018-10-04 (×8): qty 1

## 2018-10-04 MED ORDER — FOLIC ACID 0.5 MG HALF TAB
500.0000 ug | ORAL_TABLET | Freq: Every evening | ORAL | Status: DC
  Administered 2018-10-05: 0.5 mg via ORAL
  Filled 2018-10-04: qty 1

## 2018-10-04 MED ORDER — ENOXAPARIN SODIUM 40 MG/0.4ML ~~LOC~~ SOLN
40.0000 mg | SUBCUTANEOUS | Status: DC
  Administered 2018-10-05 – 2018-10-12 (×9): 40 mg via SUBCUTANEOUS
  Filled 2018-10-04 (×9): qty 0.4

## 2018-10-04 MED ORDER — METRONIDAZOLE IN NACL 5-0.79 MG/ML-% IV SOLN
500.0000 mg | Freq: Three times a day (TID) | INTRAVENOUS | Status: DC
  Administered 2018-10-05 (×3): 500 mg via INTRAVENOUS
  Filled 2018-10-04 (×2): qty 100

## 2018-10-04 MED ORDER — SODIUM CHLORIDE 0.9 % IV SOLN
2.0000 g | Freq: Two times a day (BID) | INTRAVENOUS | Status: DC
  Administered 2018-10-05 – 2018-10-07 (×5): 2 g via INTRAVENOUS
  Filled 2018-10-04 (×6): qty 2

## 2018-10-04 MED ORDER — VANCOMYCIN HCL 10 G IV SOLR
1750.0000 mg | Freq: Once | INTRAVENOUS | Status: AC
  Administered 2018-10-04: 1750 mg via INTRAVENOUS
  Filled 2018-10-04: qty 1750

## 2018-10-04 MED ORDER — METRONIDAZOLE IN NACL 5-0.79 MG/ML-% IV SOLN
500.0000 mg | Freq: Once | INTRAVENOUS | Status: AC
  Administered 2018-10-04: 500 mg via INTRAVENOUS
  Filled 2018-10-04: qty 100

## 2018-10-04 MED ORDER — MAGNESIUM OXIDE 400 (241.3 MG) MG PO TABS
200.0000 mg | ORAL_TABLET | Freq: Every day | ORAL | Status: DC
  Administered 2018-10-05 – 2018-10-13 (×10): 200 mg via ORAL
  Filled 2018-10-04 (×10): qty 1

## 2018-10-04 MED ORDER — VITAMIN B-12 1000 MCG PO TABS
500.0000 ug | ORAL_TABLET | Freq: Every evening | ORAL | Status: DC
  Administered 2018-10-05 – 2018-10-12 (×9): 500 ug via ORAL
  Filled 2018-10-04 (×9): qty 1

## 2018-10-04 MED ORDER — PRAVASTATIN SODIUM 40 MG PO TABS
40.0000 mg | ORAL_TABLET | Freq: Every day | ORAL | Status: DC
  Administered 2018-10-05 – 2018-10-12 (×9): 40 mg via ORAL
  Filled 2018-10-04 (×9): qty 1

## 2018-10-04 MED ORDER — NITROGLYCERIN 0.4 MG SL SUBL: 0.4000 mg | SUBLINGUAL_TABLET | SUBLINGUAL | Status: DC | PRN

## 2018-10-04 MED ORDER — SODIUM CHLORIDE 0.9 % IV BOLUS
2000.0000 mL | Freq: Once | INTRAVENOUS | Status: AC
  Administered 2018-10-04: 2000 mL via INTRAVENOUS

## 2018-10-04 MED ORDER — OXYBUTYNIN CHLORIDE 5 MG PO TABS
5.0000 mg | ORAL_TABLET | Freq: Two times a day (BID) | ORAL | Status: DC
  Administered 2018-10-05 – 2018-10-13 (×18): 5 mg via ORAL
  Filled 2018-10-04 (×18): qty 1

## 2018-10-04 MED ORDER — THIAMINE HCL 100 MG/ML IJ SOLN
100.0000 mg | Freq: Once | INTRAMUSCULAR | Status: AC
  Administered 2018-10-04: 100 mg via INTRAVENOUS
  Filled 2018-10-04: qty 2

## 2018-10-04 MED ORDER — PREDNISONE 20 MG PO TABS
30.0000 mg | ORAL_TABLET | Freq: Every day | ORAL | Status: DC
  Administered 2018-10-05: 30 mg via ORAL
  Filled 2018-10-04: qty 1

## 2018-10-04 MED ORDER — ACETAMINOPHEN 500 MG PO TABS
500.0000 mg | ORAL_TABLET | Freq: Two times a day (BID) | ORAL | Status: DC
  Administered 2018-10-05 (×2): 500 mg via ORAL
  Filled 2018-10-04 (×2): qty 1

## 2018-10-04 MED ORDER — LOPERAMIDE HCL 2 MG PO CAPS
2.0000 mg | ORAL_CAPSULE | Freq: Every evening | ORAL | Status: DC
  Administered 2018-10-05 – 2018-10-07 (×4): 2 mg via ORAL
  Filled 2018-10-04 (×5): qty 1

## 2018-10-04 MED ORDER — SODIUM CHLORIDE 0.9 % IV SOLN
INTRAVENOUS | Status: DC
  Administered 2018-10-05: via INTRAVENOUS

## 2018-10-04 MED ORDER — CARVEDILOL 6.25 MG PO TABS
6.2500 mg | ORAL_TABLET | Freq: Two times a day (BID) | ORAL | Status: DC
  Administered 2018-10-05: 6.25 mg via ORAL
  Filled 2018-10-04 (×2): qty 1

## 2018-10-04 NOTE — H&P (Signed)
[facility]                                                                                    Patient Demographics  Anthony [name], is a 84 y.o. male  CSN: [id number]  MRN: [id number]  DOB - Aug 26, 1934  Admit Date - 10/04/2018  Outpatient Primary MD for the patient is Josetta Huddle, MD   With History of -  Past Medical History:  Diagnosis Date  . Arthritis    rheumatoid-  Dr Barkley Boards  . CHF (congestive heart failure) (HCC)    chronic systolic heart failure per Dr [name] LOV note 09/23/11.   EKG 4/12, stress test  and cath from 8/12 on chart.  Clearance with LOV Dr [name] on chart  . Coronary artery disease   . Dysrhythmia    nonsustained,asymptomatic VT per Dr [name] office note  resulting in cath  . GERD (gastroesophageal reflux disease)   . Heart attack ([place]) 1996, 06/2011  . Hyperlipidemia   . Hypertension   . Pneumonia 1993  . Prostate troubles    states take alternating meds for bladder/prostate control- "age thing"  . Sleep apnea    sleep study years ago- has apnea but did not qualify for CPAP      Past Surgical History:  Procedure Laterality Date  . CARDIAC CATHETERIZATION     1996/ 2012 report on chart  . COLONOSCOPY    . INGUINAL HERNIA REPAIR  11/04/2011   Procedure: HERNIA REPAIR INGUINAL ADULT;  Surgeon: Pedro Earls, MD;  Location: [facility] ORS;  Service: General;  Laterality: Right;  Right Inguinal Hernia Repair with Mesh  . KNEE ARTHROSCOPY Right 1976  . MENISCUS DEBRIDEMENT Left 1996  . TRIGGER FINGER RELEASE Right 10/10/2009  . UMBILICAL HERNIA REPAIR  2009  . WRIST SURGERY Left 04/25/91    in for   Chief Complaint  Patient presents with  . Weakness     HPI  Anthony [name]  is a 84 y.o. male, with past medical history significant for chronic congestive heart failure, systolic, coronary artery disease, rheumatoid arthritis on methotrexate, dementia on Aricept presenting with 2 days history of generalized weakness and decreased p.o.  intake.  The patient is not a good historian and most of the history was taken from his wife on the phone and his daughter who was at bedside.  They described a cough for the last 1 week with tonsillar follicles.  In the emergency room the patient was noted to be somnolent, noncontributory and extremely dry.  Patient noted to have small punctate lesions in his throat, but no erythema.  He was noted to be febrile at 100.4.  Since patient is immunocompromised due to steroids and methotrexate, the decision was taken to start him on IV antibiotics for sepsis of unknown origin.    Review of Systems    Unable to obtain due to patient's condition  Social History Social History   Tobacco Use  . Smoking status: Former Smoker    Packs/day: 3.00    Years: 20.00    Pack years: 60.00    Types: Cigarettes    Last attempt to quit: 11/01/1970  Years since quitting: 47.9  . Smokeless tobacco: Never Used  . Tobacco comment: quit 1967  Substance Use Topics  . Alcohol use: No     Family History Family History  Problem Relation Age of Onset  . Heart disease Mother   . Rheum arthritis Mother   . Emphysema Father      Prior to Admission medications   Medication Sig Start Date End Date Taking? Authorizing Provider  acetaminophen (TYLENOL) 500 MG tablet Take 500 mg by mouth 2 (two) times daily.   Yes [provider]  CALCIUM-VITAMIN D PO Take 1 tablet by mouth every evening.    Yes [provider]  carvedilol (COREG) 6.25 MG tablet TAKE 1 TABLET TWICE A DAY WITH A MEAL. Patient taking differently: Take 6.25 mg by mouth 2 (two) times daily with a meal.  02/22/18  Yes Burtis Junes, NP  Cholecalciferol (VITAMIN D3) 50 MCG (2000 UT) capsule Take 2,000 Units by mouth every morning.   Yes [provider]  Coenzyme Q10 (CO Q-10) 100 MG CAPS Take 100 mg by mouth every evening.    Yes [provider]  Cyanocobalamin (B-12) 500 MCG TABS Take 500 mcg by mouth every  evening.   Yes [provider]  donepezil (ARICEPT) 5 MG tablet Take 5 mg by mouth at bedtime. 08/15/18  Yes [provider]  famotidine (PEPCID) 20 MG tablet Take 40 mg by mouth at bedtime.   Yes [provider]  feeding supplement, ENSURE ENLIVE, (ENSURE ENLIVE) LIQD Take 237 mLs by mouth 2 (two) times daily between meals. 04/07/18  Yes Debbe Odea, MD  FLUoxetine (PROZAC) 20 MG capsule Take 20 mg by mouth every morning.  09/06/16  Yes [provider]  folic acid (FOLVITE) 119 MCG tablet Take 400 mcg by mouth every evening.    Yes [provider]  loperamide (IMODIUM) 2 MG capsule Take 2 mg by mouth every evening.   Yes [provider]  magnesium oxide (MAG-OX) 400 MG tablet Take 200 mg by mouth daily.    Yes [provider]  mesalamine (LIALDA) 1.2 G EC tablet Take 4.8 g by mouth daily with breakfast.    Yes [provider]  methotrexate (RHEUMATREX) 2.5 MG tablet Take 8 tablets by mouth Once a week. Wednesday Take 8 tablets=20 mg. 07/02/11  Yes [provider]  nitroGLYCERIN (NITROSTAT) 0.4 MG SL tablet Place 0.4 mg under the tongue every 5 (five) minutes as needed. Chest pain    Yes [provider]  oxybutynin (DITROPAN) 5 MG tablet Take 5 mg by mouth 2 (two) times daily. 09/20/18  Yes [provider]  pravastatin (PRAVACHOL) 40 MG tablet Take 1 tablet (40 mg total) by mouth at bedtime. 03/08/18  Yes Belva Crome, MD  predniSONE (DELTASONE) 10 MG tablet Take 30 mg by mouth daily with breakfast.   Yes [provider]  tamsulosin (FLOMAX) 0.4 MG CAPS capsule Take 0.4 mg by mouth daily after supper.  10/01/14  Yes [provider]  vitamin A 10000 UNIT capsule Take 10,000 Units by mouth every evening.    Yes [provider]  vitamin E 400 UNIT capsule Take 400 Units by mouth every evening.    Yes [provider]  polyethylene glycol (MIRALAX / GLYCOLAX) packet Take  17 g by mouth daily as needed for mild constipation. Patient not taking: Reported on 04/11/2018 04/07/18   Debbe Odea, MD  senna (SENOKOT) 8.6 MG TABS tablet Take 1  tablet (8.6 mg total) by mouth 2 (two) times daily. Patient not taking: Reported on 04/11/2018 04/07/18   Debbe Odea, MD    No Known Allergies  Physical Exam  Vitals  Blood pressure 100/64, pulse 72, temperature 98.2 F (36.8 C), temperature source Oral, resp. rate 18, height 6' 1"  (1.854 m), weight 81.6 kg, SpO2 93 %.   1. General well-developed, chronically ill male, noncontributory,  2.  Flat affect and insight, Not Suicidal or Homicidal, confused.  3. No F.N deficits, grossly, patient moving all extremities.  4. Ears and Eyes appear Normal, Conjunctivae clear, PERRLA.  Dry oral Mucosa.  5. Supple Neck, No JVD, No cervical lymphadenopathy appriciated, No Carotid Bruits.  6. Symmetrical Chest wall movement, Good air movement bilaterally, CTAB.  7. RRR, No Gallops, Rubs or Murmurs, No Parasternal Heave.  8. Positive Bowel Sounds, Abdomen Soft, Non tender,   9.  No Cyanosis, Normal Skin Turgor, No Skin Rash or Bruise.  10. Good muscle tone,  joints appear normal , no effusions, Normal ROM.    Data Review  CBC Recent Labs  Lab 10/04/18 1516 10/04/18 1526  WBC 10.7*  --   HGB 13.9 14.3  HCT 41.8 42.0  PLT 254  --   MCV 95.0  --   MCH 31.6  --   MCHC 33.3  --   RDW 15.9*  --   LYMPHSABS 0.3*  --   MONOABS 0.3  --   EOSABS 0.5  --   BASOSABS 0.0  --    ------------------------------------------------------------------------------------------------------------------  Chemistries  Recent Labs  Lab 10/04/18 1516 10/04/18 1526  NA 131* 130*  K 4.1 4.2  CL 99 98  CO2 24  --   GLUCOSE 175* 175*  BUN 14 16  CREATININE 1.04 1.00  CALCIUM 8.3*  --   AST 18  --   ALT 21  --   ALKPHOS 69  --   BILITOT 1.5*  --     ------------------------------------------------------------------------------------------------------------------ estimated creatinine clearance is 64.4 mL/min (by C-G formula based on SCr of 1 mg/dL). ------------------------------------------------------------------------------------------------------------------ No results for input(s): TSH, T4TOTAL, T3FREE, THYROIDAB in the last 72 hours.  Invalid input(s): FREET3   Coagulation profile No results for input(s): INR, PROTIME in the last 168 hours. ------------------------------------------------------------------------------------------------------------------- No results for input(s): DDIMER in the last 72 hours. -------------------------------------------------------------------------------------------------------------------  Cardiac Enzymes No results for input(s): CKMB, TROPONINI, MYOGLOBIN in the last 168 hours.  Invalid input(s): CK ------------------------------------------------------------------------------------------------------------------ Invalid input(s): POCBNP   ---------------------------------------------------------------------------------------------------------------  Urinalysis    Component Value Date/Time   COLORURINE YELLOW 10/04/2018 1646   APPEARANCEUR CLEAR 10/04/2018 1646   LABSPEC 1.004 (L) 10/04/2018 1646   PHURINE 7.0 10/04/2018 1646   GLUCOSEU NEGATIVE 10/04/2018 1646   HGBUR NEGATIVE 10/04/2018 1646   BILIRUBINUR NEGATIVE 10/04/2018 1646   KETONESUR NEGATIVE 10/04/2018 1646   PROTEINUR NEGATIVE 10/04/2018 1646   UROBILINOGEN 0.2 11/05/2014 0303   NITRITE NEGATIVE 10/04/2018 1646   LEUKOCYTESUR NEGATIVE 10/04/2018 1646    ----------------------------------------------------------------------------------------------------------------   Imaging results:   Dg Chest Port 1 View  Result Date: 10/04/2018 CLINICAL DATA:  Generalized weakness EXAM: PORTABLE CHEST 1 VIEW COMPARISON:   08/10/2014 FINDINGS: The heart is normal in size. Lungs are under aerated with bibasilar atelectasis. No pneumothorax or pleural effusion. IMPRESSION: Bibasilar atelectasis. Electronically Signed   By: Marybelle Killings M.D.   On: 10/04/2018 15:58    My personal review of EKG: Rhythm NSR, 78 bpm with left anterior fascicular block and abnormal R wave progression  Assessment &  Plan  Sepsis With start IV vancomycin, cefepime and Flagyl Cultures taken Source unknown, probably tonsillitis  Dehydration Start IV fluids  History of rheumatoid arthritis on DMARDs and prednisone  History of advanced dementia on Aricept  Diabetes mellitus, controlled Insulin sliding scale  DVT Prophylaxis Lovenox  AM Labs Ordered, also please review Full Orders  Family Communication: Discussed with daughter at bedside  Code Status full  Disposition Plan: Home with home health  Time spent in minutes : 42 minutes  Condition GUARDED   @SIGNATURE @

## 2018-10-04 NOTE — ED Triage Notes (Signed)
Pt arrives to ED from home with complaints of generalized weakness, gradually but worse today. EMS reports pt's daughter called, pt has no complaints. EMS states pt lives at home with wife, is alert and oriented, has recently been incontinent of urine and had low grade temp at home of 99.7. Pt placed in position of comfort with bed locked and lowered, call bell in reach.

## 2018-10-04 NOTE — ED Notes (Signed)
Condom cath placed on pt 

## 2018-10-04 NOTE — ED Provider Notes (Signed)
[facility] EMERGENCY DEPARTMENT Provider Note   CSN: [id number] Arrival date & time: 10/04/18  1444     History   Chief Complaint Chief Complaint  Patient presents with  . Weakness  Level 5 caveat dementia.  History is obtained from patient's wife by telephone  HPI DOMONIK LEVARIO is a 84 y.o. male.  Patient has been generally weak for the past 2 days.  He has not been eating or drinking.  He has been too weak to stand up from a chair.  EMS treated patient with saline 500 mL bolus in route.  Other associated symptoms include cough for about a week.  Patient denies pain anywhere.  No other symptoms  HPI  Past Medical History:  Diagnosis Date  . Arthritis    rheumatoid-  Dr Barkley Boards  . CHF (congestive heart failure) (HCC)    chronic systolic heart failure per Dr [name] LOV note 09/23/11.   EKG 4/12, stress test  and cath from 8/12 on chart.  Clearance with LOV Dr [name] on chart  . Coronary artery disease   . Dysrhythmia    nonsustained,asymptomatic VT per Dr [name] office note  resulting in cath  . GERD (gastroesophageal reflux disease)   . Heart attack ([place]) 1996, 06/2011  . Hyperlipidemia   . Hypertension   . Pneumonia 1993  . Prostate troubles    states take alternating meds for bladder/prostate control- "age thing"  . Sleep apnea    sleep study years ago- has apnea but did not qualify for CPAP    Patient Active Problem List   Diagnosis Date Noted  . Fatty liver 04/06/2018  . Acute pancreatitis 04/04/2018  . Abdominal pain, right lower quadrant 05/28/2015  . Absolute anemia 05/28/2015  . Aortic atherosclerosis ([place]) 05/28/2015  . Altered blood in stool 05/28/2015  . Chronic systolic heart failure ([place]) 05/28/2015  . Syncope and collapse 05/28/2015  . Arteriosclerosis of coronary artery 05/28/2015  . Cough 05/28/2015  . Crohn's disease of large bowel ([place]) 05/28/2015  . D (diarrhea) 05/28/2015  . Displacement of cervical intervertebral disc  without myelopathy 05/28/2015  . Benign essential HTN 05/28/2015  . Adenopathy 05/28/2015  . Hypercholesterolemia without hypertriglyceridemia 05/28/2015  . Hypo-osmolality and hyponatremia 05/28/2015  . Postinflammatory pulmonary fibrosis ([place]) 05/28/2015  . Healed myocardial infarct 05/28/2015  . Peptic esophagitis 05/28/2015  . 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 Paroxysmal ventricular tachycardia ([place]) 05/28/2015  . Decreased body weight 05/28/2015  . Weakness 11/05/2014  . Nausea with vomiting 11/05/2014  . Generalized weakness   . Dehydration   . Difficulty walking   . Pulmonary infiltrates 07/16/2014  . Chronic combined systolic and diastolic heart failure ([place]) 05/31/2014  . Essential hypertension 05/31/2014  .  H/O Right inguinal hernia repair 01/15/2012  . Small fat containing right inguinal hernia that has been painful 08/05/2011  . Finger wound, simple, open 10/12/2008  . Benign prostatic hyperplasia without urinary obstruction 04/30/2008  . Synovitis and tenosynovitis 04/30/2008  . HLD (hyperlipidemia) 04/12/2008  . COLONIC POLYPS 11/21/2007  . MI 11/21/2007  . Coronary atherosclerosis 11/21/2007  . GERD 11/21/2007  . Rheumatoid arthritis ([place]) 11/21/2007  . SLEEP APNEA, MILD 11/21/2007    Past Surgical History:  Procedure Laterality Date  . CARDIAC CATHETERIZATION     1996/ 2012 report on chart  . COLONOSCOPY    . INGUINAL HERNIA REPAIR  11/04/2011   Procedure: HERNIA REPAIR INGUINAL ADULT;  Surgeon: Pedro Earls, MD;  Location: [facility];  Service: General;  Laterality: Right;  Right Inguinal Hernia Repair with Mesh  . KNEE ARTHROSCOPY Right 1976  . MENISCUS DEBRIDEMENT Left 1996  . TRIGGER FINGER RELEASE Right 10/10/2009  . UMBILICAL HERNIA REPAIR  2009  . WRIST SURGERY Left 04/25/91        Home Medications    Prior to Admission medications   Medication Sig Start Date End Date Taking? Authorizing Provider  CALCIUM-VITAMIN D PO Take 1,000 mg by mouth daily.      [provider]  carvedilol (COREG) 6.25 MG tablet TAKE 1 TABLET TWICE A DAY WITH A MEAL. 02/22/18   Burtis Junes, NP  Coenzyme Q10 (CO Q-10) 100 MG CAPS Take 100 mg by mouth daily.    [provider]  donepezil (ARICEPT) 10 MG tablet Take 10 mg by mouth at bedtime.    [provider]  feeding supplement, ENSURE ENLIVE, (ENSURE ENLIVE) LIQD Take 237 mLs by mouth 2 (two) times daily between meals. 04/07/18   Debbe Odea, MD  fish oil-omega-3 fatty acids 1000 MG capsule Take 1 g by mouth daily.     [provider]  FLUoxetine (PROZAC) 20 MG capsule Take 20 mg by mouth daily.  09/06/16   [provider]  folic acid (FOLVITE) 979 MCG tablet Take 400 mcg by mouth 2 (two) times daily.     [provider]  magnesium oxide (MAG-OX) 400 MG tablet Take 200 mg by mouth daily.     [provider]  mesalamine (LIALDA) 1.2 G EC tablet Take 4.8 g by mouth daily with breakfast.     [provider]  methotrexate (RHEUMATREX) 2.5 MG tablet Take 8 tablets by mouth Once a week. Wednesday Take 8 tablets=20 mg. 07/02/11   [provider]  nitroGLYCERIN (NITROSTAT) 0.4 MG SL tablet Place 0.4 mg under the tongue every 5 (five) minutes as needed. Chest pain     [provider]  polyethylene glycol (MIRALAX / GLYCOLAX) packet Take 17 g by mouth daily as needed for mild constipation. Patient not taking: Reported on 04/11/2018 04/07/18   Debbe Odea, MD  pravastatin (PRAVACHOL) 40 MG tablet Take 1 tablet (40 mg total) by mouth at bedtime. 03/08/18   Belva Crome, MD  ranitidine (ZANTAC) 300 MG capsule Take 300 mg by mouth At bedtime.  07/30/11   [provider]  senna (SENOKOT) 8.6 MG TABS tablet Take 1 tablet (8.6 mg total) by mouth 2 (two) times daily. Patient not taking: Reported on 04/11/2018 04/07/18   Debbe Odea, MD  tamsulosin (FLOMAX) 0.4 MG CAPS capsule Take 1 capsule by mouth daily. 10/01/14   [provider]  traMADol (ULTRAM) 50 MG tablet Take 50 mg by mouth every 6 (six) hours as needed for moderate pain or severe pain.  04/16/15   [provider]  vitamin A 10000 UNIT capsule Take 10,000 Units by mouth daily.    [provider]  vitamin E 400 UNIT capsule Take 400 Units by mouth daily.    [provider]  Vitamins A & D (VITAMIN A & D) 10000-400 UNITS CAPS Take 1 tablet by mouth daily.     [provider]   Prednisone Family History Family History  Problem Relation Age of Onset  . Heart disease Mother   . Rheum arthritis Mother   . Emphysema Father     Social History Social History   Tobacco Use  . Smoking status: Former Smoker    Packs/day: 3.00  Years: 20.00    Pack years: 60.00    Types: Cigarettes    Last attempt to quit: 11/01/1970    Years since quitting: 47.9  . Smokeless tobacco: Never Used  . Tobacco comment: quit 1967  Substance Use Topics  . Alcohol use: No  . Drug use: No     Allergies   Patient has no known allergies.   Review of Systems Review of Systems  Unable to perform ROS: Dementia  Constitutional: Positive for appetite change.  Respiratory: Positive for cough.   Musculoskeletal: Positive for gait problem.       Walks with cane at baseline  Neurological: Positive for weakness.     Physical Exam Updated Vital Signs BP 97/65   Pulse 80   Temp 98.2 F (36.8 C) (Oral)   Resp 19   Ht 6' 1"  (1.854 m)   Wt 81.6 kg   SpO2 93%   BMI 23.75 kg/m   Physical Exam  Constitutional: No distress.  Frail-appearing  HENT:  Head: Normocephalic and atraumatic.  Mucous membranes dry oropharynx reddened  Eyes: Conjunctivae and EOM are normal.  Neck: Neck supple. No tracheal deviation present. No thyromegaly present.  Cardiovascular: Normal rate and regular rhythm.  No murmur heard. Pulmonary/Chest: Effort normal and breath sounds normal.  Abdominal: Soft. Bowel sounds are normal. He exhibits no distension.  There is no tenderness.  Genitourinary: Penis normal.  Genitourinary Comments: Rectum normal tone brown stool no gross blood  Musculoskeletal: Normal range of motion. He exhibits no edema or tenderness.  Neurological: He is alert. Coordination normal.  Oriented to name and hospital does not know month or year  Skin: Skin is warm and dry. No rash noted.  Psychiatric: He has a normal mood and affect.  Nursing note and vitals reviewed.    ED Treatments / Results  Labs (all labs ordered are listed, but only abnormal results are displayed) Labs Reviewed  CULTURE, BLOOD (ROUTINE X 2)  CULTURE, BLOOD (ROUTINE X 2)  URINE CULTURE  GROUP A STREP BY PCR  COMPREHENSIVE METABOLIC PANEL  CBC WITH DIFFERENTIAL/PLATELET  URINALYSIS, ROUTINE W REFLEX MICROSCOPIC  I-STAT CG4 LACTIC ACID, ED  I-STAT TROPONIN, ED  I-STAT CHEM 8, ED    EKG EKG Interpretation  Date/Time:  Tuesday October 04 2018 14:55:44 EST Ventricular Rate:  78 PR Interval:    QRS Duration: 110 QT Interval:  400 QTC Calculation: 456 R Axis:   -67 Text Interpretation:  Sinus rhythm Left anterior fascicular block Abnormal R-wave progression, late transition No significant change since last tracing Confirmed by Orlie Dakin [phone]) on 10/04/2018 3:16:17 PM   Radiology No results found.  Procedures Procedures (including critical care time)  Medications Ordered in ED Medications  sodium chloride 0.9 % bolus 2,000 mL (has no administration in time range)     Initial Impression / Assessment and Plan / ED Course  I have reviewed the triage vital signs and the nursing notes.  Pertinent labs & imaging results that were available during my care of the patient were reviewed by me and considered in my medical decision making (see chart for details).     Patient noted to be dehydrated clinically.  Mildly hypotensive.  Broad-spectrum antibiotics ordered.  He is immune compromised.  Wife reports he did not get a flu  vaccine this year 5:40 PM patient is alert appears in no distress.  Blood pressure has improved somewhat after treatment with intravenous fluids and intravenous antibiotics.  Chest x-ray viewed by me  Results for orders placed or performed during the hospital encounter of 10/04/18  Group A Strep by PCR  Result Value Ref Range   Group A Strep by PCR NOT DETECTED NOT DETECTED  Comprehensive metabolic panel  Result Value Ref Range   Sodium 131 (L) 135 - 145 mmol/L   Potassium 4.1 3.5 - 5.1 mmol/L   Chloride 99 98 - 111 mmol/L   CO2 24 22 - 32 mmol/L   Glucose, Bld 175 (H) 70 - 99 mg/dL   BUN 14 8 - 23 mg/dL   Creatinine, Ser 1.04 0.61 - 1.24 mg/dL   Calcium 8.3 (L) 8.9 - 10.3 mg/dL   Total Protein 5.7 (L) 6.5 - 8.1 g/dL   Albumin 2.8 (L) 3.5 - 5.0 g/dL   AST 18 15 - 41 U/L   ALT 21 0 - 44 U/L   Alkaline Phosphatase 69 38 - 126 U/L   Total Bilirubin 1.5 (H) 0.3 - 1.2 mg/dL   GFR calc non Af Amer >60 >60 mL/min   GFR calc Af Amer >60 >60 mL/min   Anion gap 8 5 - 15  CBC WITH DIFFERENTIAL  Result Value Ref Range   WBC 10.7 (H) 4.0 - 10.5 K/uL   RBC 4.40 4.22 - 5.81 MIL/uL   Hemoglobin 13.9 13.0 - 17.0 g/dL   HCT 41.8 39.0 - 52.0 %   MCV 95.0 80.0 - 100.0 fL   MCH 31.6 26.0 - 34.0 pg   MCHC 33.3 30.0 - 36.0 g/dL   RDW 15.9 (H) 11.5 - 15.5 %   Platelets 254 150 - 400 K/uL   nRBC 0.0 0.0 - 0.2 %   Neutrophils Relative % 86 %   Neutro Abs 9.3 (H) 1.7 - 7.7 K/uL   Lymphocytes Relative 3 %   Lymphs Abs 0.3 (L) 0.7 - 4.0 K/uL   Monocytes Relative 3 %   Monocytes Absolute 0.3 0.1 - 1.0 K/uL   Eosinophils Relative 5 %   Eosinophils Absolute 0.5 0.0 - 0.5 K/uL   Basophils Relative 0 %   Basophils Absolute 0.0 0.0 - 0.1 K/uL   Immature Granulocytes 3 %   Abs Immature Granulocytes 0.29 (H) 0.00 - 0.07 K/uL  Urinalysis, Routine w reflex microscopic  Result Value Ref Range   Color, Urine YELLOW YELLOW   APPearance CLEAR CLEAR   Specific Gravity, Urine 1.004 (L) 1.005 - 1.030   pH  7.0 5.0 - 8.0   Glucose, UA NEGATIVE NEGATIVE mg/dL   Hgb urine dipstick NEGATIVE NEGATIVE   Bilirubin Urine NEGATIVE NEGATIVE   Ketones, ur NEGATIVE NEGATIVE mg/dL   Protein, ur NEGATIVE NEGATIVE mg/dL   Nitrite NEGATIVE NEGATIVE   Leukocytes, UA NEGATIVE NEGATIVE  I-Stat CG4 Lactic Acid, ED  Result Value Ref Range   Lactic Acid, Venous 1.48 0.5 - 1.9 mmol/L  I-stat troponin, ED (not at [facility])  Result Value Ref Range   Troponin i, poc 0.02 0.00 - 0.08 ng/mL   Comment 3          I-stat Chem 8, ED  Result Value Ref Range   Sodium 130 (L) 135 - 145 mmol/L   Potassium 4.2 3.5 - 5.1 mmol/L   Chloride 98 98 - 111 mmol/L   BUN 16 8 - 23 mg/dL   Creatinine, Ser 1.00 0.61 - 1.24 mg/dL   Glucose, Bld 175 (H) 70 - 99 mg/dL   Calcium, Ion 1.07 (L) 1.15 - 1.40 mmol/L   TCO2 25 22 - 32 mmol/L  Hemoglobin 14.3 13.0 - 17.0 g/dL   HCT 42.0 39.0 - 52.0 %   Dg Chest Port 1 View  Result Date: 10/04/2018 CLINICAL DATA:  Generalized weakness EXAM: PORTABLE CHEST 1 VIEW COMPARISON:  08/10/2014 FINDINGS: The heart is normal in size. Lungs are under aerated with bibasilar atelectasis. No pneumothorax or pleural effusion. IMPRESSION: Bibasilar atelectasis. Electronically Signed   By: Marybelle Killings M.D.   On: 10/04/2018 15:58  labWork remarkable for mild hyperglycemia.  Patient's daughter arrived and corroborated HPI.  Presently patient is full code.  Palliative care consult ordered ordered.  I consulted Dr.Hijazi arrange for overnight stay Final Clinical Impressions(s) / ED Diagnoses  Dx #1 febrile illness #2 generalized weakness #3 hypotension Final diagnoses:  None  #4 hyperglycemia  ED Discharge Orders    None       Orlie Dakin, MD 10/04/18 1750

## 2018-10-04 NOTE — ED Notes (Signed)
Condom cath replaced on pt.

## 2018-10-04 NOTE — Progress Notes (Signed)
Pharmacy Antibiotic Note  Anthony [name] is a 84 y.o. male admitted on 10/04/2018 with sepsis.  Pharmacy has been consulted for cefepime and vancomycin dosing.  Currently, afebrile with elevated WBC 10.7. Scr 1.0 mg/dL, CrCl ~64 mL/min.    Plan: Cefepime 2 gm IV q12h Vancomycin 1750 mg IV once, then 1000 mg IV q12h Monitor renal function and vancomycin trough at steady-state F/u cultures and LOT   Height: 6' 1"  (185.4 cm) Weight: 180 lb (81.6 kg) IBW/kg (Calculated) : 79.9  Temp (24hrs), Avg:98.2 F (36.8 C), Min:98.2 F (36.8 C), Max:98.2 F (36.8 C)  Recent Labs  Lab 10/04/18 1516 10/04/18 1526 10/04/18 1527  WBC 10.7*  --   --   CREATININE 1.04 1.00  --   LATICACIDVEN  --   --  1.48    Estimated Creatinine Clearance: 64.4 mL/min (by C-G formula based on SCr of 1 mg/dL).    No Known Allergies  Antimicrobials this admission: Cefepime 11/12>> Vancomycin 11/12>> Metronidazole 11/12>>   Microbiology results: 11/12 UCx: 11/12 BCx: 11/12 GAS PCR:   Thank you for allowing pharmacy to be a part of this patient's care.  Willia Craze, Pharmacy Student

## 2018-10-04 NOTE — Progress Notes (Signed)
Patient's daughter Anthony [name] is at bedside during admission. Anthony [name] requests she be contacted in the event of changes/updates/questions; as her mother/pt's husband also suffers from some dementia.

## 2018-10-05 DIAGNOSIS — E860 Dehydration: Secondary | ICD-10-CM

## 2018-10-05 LAB — BASIC METABOLIC PANEL
ANION GAP: 5 (ref 5–15)
BUN: 12 mg/dL (ref 8–23)
CHLORIDE: 106 mmol/L (ref 98–111)
CO2: 23 mmol/L (ref 22–32)
Calcium: 7.9 mg/dL — ABNORMAL LOW (ref 8.9–10.3)
Creatinine, Ser: 0.94 mg/dL (ref 0.61–1.24)
GFR calc Af Amer: >60 mL/min (ref >60)
GLUCOSE: 127 mg/dL — AB (ref 70–99)
POTASSIUM: 3.7 mmol/L (ref 3.5–5.1)
Sodium: 134 mmol/L — ABNORMAL LOW (ref 135–145)

## 2018-10-05 LAB — URINE CULTURE: CULTURE: NO GROWTH

## 2018-10-05 LAB — GLUCOSE, CAPILLARY: Glucose-Capillary: 82 mg/dL (ref 70–99)

## 2018-10-05 MED ORDER — FLUOXETINE HCL 20 MG PO CAPS
20.0000 mg | ORAL_CAPSULE | Freq: Every day | ORAL | Status: DC
  Administered 2018-10-05 – 2018-10-13 (×9): 20 mg via ORAL
  Filled 2018-10-05 (×10): qty 1

## 2018-10-05 MED ORDER — PREDNISONE 20 MG PO TABS
20.0000 mg | ORAL_TABLET | Freq: Every day | ORAL | Status: DC
  Administered 2018-10-06 – 2018-10-08 (×3): 20 mg via ORAL
  Filled 2018-10-05 (×3): qty 1

## 2018-10-05 MED ORDER — DONEPEZIL HCL 5 MG PO TABS
5.0000 mg | ORAL_TABLET | Freq: Every day | ORAL | Status: DC
  Administered 2018-10-05 – 2018-10-12 (×8): 5 mg via ORAL
  Filled 2018-10-05 (×8): qty 1

## 2018-10-05 MED ORDER — GUAIFENESIN-DM 100-10 MG/5ML PO SYRP
5.0000 mL | ORAL_SOLUTION | ORAL | Status: DC | PRN
  Administered 2018-10-05 – 2018-10-09 (×4): 5 mL via ORAL
  Filled 2018-10-05 (×5): qty 5

## 2018-10-05 MED ORDER — FOLIC ACID 1 MG PO TABS
1.0000 mg | ORAL_TABLET | Freq: Every evening | ORAL | Status: DC
  Administered 2018-10-05 – 2018-10-12 (×8): 1 mg via ORAL
  Filled 2018-10-05 (×8): qty 1

## 2018-10-05 MED ORDER — CARVEDILOL 3.125 MG PO TABS
3.1250 mg | ORAL_TABLET | Freq: Two times a day (BID) | ORAL | Status: DC
  Administered 2018-10-05 – 2018-10-13 (×16): 3.125 mg via ORAL
  Filled 2018-10-05 (×15): qty 1

## 2018-10-05 MED ORDER — ENSURE ENLIVE PO LIQD
237.0000 mL | Freq: Two times a day (BID) | ORAL | Status: DC
  Administered 2018-10-05 – 2018-10-13 (×12): 237 mL via ORAL

## 2018-10-05 MED ORDER — SODIUM CHLORIDE 0.9 % IV SOLN
INTRAVENOUS | Status: AC
  Administered 2018-10-05 – 2018-10-06 (×2): via INTRAVENOUS

## 2018-10-05 MED ORDER — ACETAMINOPHEN 325 MG PO TABS
650.0000 mg | ORAL_TABLET | Freq: Four times a day (QID) | ORAL | Status: DC | PRN
  Administered 2018-10-06 – 2018-10-10 (×9): 650 mg via ORAL
  Filled 2018-10-05 (×10): qty 2

## 2018-10-05 MED ORDER — NYSTATIN 100000 UNIT/ML MT SUSP
5.0000 mL | Freq: Four times a day (QID) | OROMUCOSAL | Status: DC
  Administered 2018-10-05 – 2018-10-13 (×31): 500000 [IU] via OROMUCOSAL
  Filled 2018-10-05 (×30): qty 5

## 2018-10-05 NOTE — Progress Notes (Signed)
PROGRESS NOTE   Anthony [name]  MWN:[id number]    DOB: [birth year]    DOA: 10/04/2018  PCP: Josetta Huddle, MD   I have briefly reviewed patients previous medical [facility] in [facility].  Brief Narrative:  84 year old male, lives with family, ambulates with the help of a cane, PMH of RA on chronic methotrexate, chronic systolic CHF, CAD, NSVT, GERD, HLD, HTN, OSA, dementia, Crohn's disease for which he was started on prednisone just a week ago for diarrhea and suspected Crohn's flare and inability to afford Entocort, reportedly sustained a mechanical fall a week prior to admission, presented with progressively weakness, decreased oral intake, dry cough, low-grade fevers up to 101 F, dizzy and lightheaded at times, worsening of chronic urinary incontinence and transient diarrhea that has resolved.  He was admitted for suspected sepsis of unclear source, dehydration, oral thrush and deconditioning.   Assessment & Plan:   Active Problems:   Sepsis ([place])   Suspected sepsis: Unclear source.  Febrile up to 101.30F rectally this morning.  Transiently hypotensive and tachypneic in ED.  Lactate normal.  Flu panel PCR negative.  UA not suggestive of UTI.  Group A strep by PCR negative.  Chest x-ray shows bibasilar atelectasis.  Blood cultures x2: Negative to date.  Since patient is immunocompromised on prednisone and MTX, empirically started on broad-spectrum antibiotics including IV vancomycin, cefepime and Flagyl.  DC Flagyl.  Oral thrush: In the context of chronic steroids.  Nystatin swish and swallow.  Dehydration with hyponatremia: Secondary to poor oral intake.  IV normal saline hydration.  Rheumatoid arthritis: Hold methotrexate.  Not on chronic prednisone as per family's report.  Follows with rheumatologist outpatient.  Patient follow-up with rheumatology  Dementia: Continue Aricept.  Essential hypertension: Soft blood pressures.  Reduce carvedilol to 3.125 mg twice daily and titrate  up as blood pressures improved.  Hyperlipidemia: Continue statins.  Chronic systolic CHF: Clinically dehydrated.  Gently hydrate and follow closely.  CAD: Asymptomatic of chest pain.  GERD: Continue Pepcid.  OSA: Reportedly did not qualify for CPAP.  Mechanical fall: PT evaluated and recommend SNF.  PT also noted coughing and mild choking when drinking water and recommended speech therapy consult, placed.  Bilateral thigh rash: Chronic rash over bilateral thighs in a honeycomb pattern reportedly due to chronic use of hot water bag for raynauds disease  History of Crohn's disease: As per family's report, started on prednisone a week ago because he was unable to afford Entocort and has not been on prednisone chronically.  [facility] consulted and rapidly tapering prednisone to DC over the next 2 days.  I discussed with Dr. Watt Climes.  Continue Lialda.   DVT prophylaxis: Lovenox Code Status: Full Family Communication: Discussed in detail with patient's daughter at bedside. Disposition: DC to SNF pending clinical improvement.   Consultants:  [facility]  Procedures:  None  Antimicrobials:  IV vancomycin and cefepime Discontinued Flagyl   Subjective: Patient is a poor historian.  Obtained history from daughter at bedside.  Reportedly sustained a mechanical fall a week prior to admission, presented with progressively weakness, decreased oral intake, dry cough, low-grade fevers up to 101 F, dizzy and lightheaded at times, worsening of chronic urinary incontinence and transient diarrhea that has resolved.  ROS: Above, otherwise negative.  Objective:  Vitals:   10/05/18 0959 10/05/18 1000 10/05/18 1012 10/05/18 1206  BP:   107/72 (!) 101/49  Pulse:    77  Resp:    20  Temp: (!) 101.8 F (  38.8 C) 97.7 F (36.5 C)  98.2 F (36.8 C)  TempSrc: Rectal Oral  Oral  SpO2:    91%  Weight:      Height:        Examination:  General exam: Elderly male, moderately built and thinly  nourished, frail and chronically ill looking lying comfortably propped up in bed.   ENT: Oral mucosa dry.  Thrush noted on tongue.  Mild patchy erythema of posterior pharyngeal wall without exudate or ulcers. Respiratory system: Clear to auscultation. Respiratory effort normal. Cardiovascular system: S1 & S2 heard, RRR. No JVD, murmurs, rubs, gallops or clicks. No pedal edema.  Telemetry personally reviewed: Sinus rhythm. Gastrointestinal system: Abdomen is nondistended, soft and nontender. No organomegaly or masses felt. Normal bowel sounds heard. Central nervous system: Alert and oriented to self and partly to place. No focal neurological deficits. Extremities: Symmetric 5 x 5 power. Skin: Chronic rash on bilateral thighs in a honeycomb pattern reportedly due to use of hot water bag for Raynaud's. Psychiatry: Judgement and insight appear impaired. Mood & affect flat.     Data Reviewed: I have personally reviewed following labs and imaging studies  CBC: Recent Labs  Lab 10/04/18 1516 10/04/18 1526  WBC 10.7*  --   NEUTROABS 9.3*  --   HGB 13.9 14.3  HCT 41.8 42.0  MCV 95.0  --   PLT 254  --    Basic Metabolic Panel: Recent Labs  Lab 10/04/18 1516 10/04/18 1526 10/05/18 0427  NA 131* 130* 134*  K 4.1 4.2 3.7  CL 99 98 106  CO2 24  --  23  GLUCOSE 175* 175* 127*  BUN 14 16 12   CREATININE 1.04 1.00 0.94  CALCIUM 8.3*  --  7.9*   Liver Function Tests: Recent Labs  Lab 10/04/18 1516  AST 18  ALT 21  ALKPHOS 69  BILITOT 1.5*  PROT 5.7*  ALBUMIN 2.8*   CBG: Recent Labs  Lab 10/05/18 0911  GLUCAP 82    Recent Results (from the past 240 hour(s))  Blood Culture (routine x 2)     Status: None (Preliminary result)   Collection Time: 10/04/18  3:16 PM  Result Value Ref Range Status   Specimen Description BLOOD RIGHT WRIST  Final   Special Requests   Final    BOTTLES DRAWN AEROBIC AND ANAEROBIC Blood Culture adequate volume   Culture   Final    NO GROWTH < 24  HOURS Performed at [facility], [place] [address], [place], [place] [address]    Report Status PENDING  Incomplete  Group A Strep by PCR     Status: None   Collection Time: 10/04/18  3:31 PM  Result Value Ref Range Status   Group A Strep by PCR NOT DETECTED NOT DETECTED Final    Comment: Performed at [facility], [place] [address] [place], [place] [address]  Blood Culture (routine x 2)     Status: None (Preliminary result)   Collection Time: 10/04/18  4:36 PM  Result Value Ref Range Status   Specimen Description BLOOD LEFT HAND  Final   Special Requests   Final    BOTTLES DRAWN AEROBIC AND ANAEROBIC Blood Culture adequate volume   Culture   Final    NO GROWTH < 24 HOURS Performed at [facility], [place] [address] [place], [place] [address]    Report Status PENDING  Incomplete  Urine culture     Status: None  Collection Time: 10/04/18  4:46 PM  Result Value Ref Range Status   Specimen Description URINE, CLEAN CATCH  Final   Special Requests NONE  Final   Culture   Final    NO GROWTH Performed at [facility], [address], [place], [place] [address]    Report Status 10/05/2018 FINAL  Final         Radiology Studies: Dg Chest Port 1 View  Result Date: 10/04/2018 CLINICAL DATA:  Generalized weakness EXAM: PORTABLE CHEST 1 VIEW COMPARISON:  08/10/2014 FINDINGS: The heart is normal in size. Lungs are under aerated with bibasilar atelectasis. No pneumothorax or pleural effusion. IMPRESSION: Bibasilar atelectasis. Electronically Signed   By: Marybelle Killings M.D.   On: 10/04/2018 15:58        Scheduled Meds: . acetaminophen  500 mg Oral BID  . carvedilol  6.25 mg Oral BID WC  . enoxaparin (LOVENOX) injection  40 mg Subcutaneous Q24H  . famotidine  40 mg Oral QHS  . folic acid  1 mg Oral QPM  . loperamide  2 mg Oral QPM  . magnesium oxide  200 mg Oral Daily  . mesalamine  4.8 g Oral Q breakfast  . nystatin  5 mL Mouth/Throat QID  . oxybutynin  5 mg  Oral BID  . pravastatin  40 mg Oral QHS  . predniSONE  30 mg Oral Q breakfast  . tamsulosin  0.4 mg Oral QPC supper  . vitamin B-12  500 mcg Oral QPM   Continuous Infusions: . sodium chloride 75 mL/hr at 10/05/18 0013  . ceFEPime (MAXIPIME) IV 2 g (10/05/18 9758)  . metronidazole 500 mg (10/05/18 1248)  . vancomycin 1,000 mg (10/05/18 0502)     LOS: 1 day     Vernell Leep, MD, FACP, [facility]. [facility] Pager [phone]  If 7PM-7AM, please contact night-coverage [email or web address] Password [email or web address] 10/05/2018, 3:15 PM

## 2018-10-05 NOTE — Consult Note (Signed)
   [facility] CM Inpatient Consult   10/05/2018  Anthony [name] December 27, 1933 [id number]  Patient was assessed for [name] for community services. Patient was previously active with [place] Coordinator and [facility] Social Worker. Patient with [employer] plan.    Primary Care Provider:  Dr. Josetta Huddle This office provides the transition of care calls when patient transitions home.  Chart review reveals that the patient is Anthony [name]  is a 84 y.o. male, with past medical history significant for chronic congestive heart failure, systolic, coronary artery disease, rheumatoid arthritis on methotrexate, dementia on Aricept presenting with 2 days history of generalized weakness and decreased p.o. intake.   Patient's disposition is currently unknown and awaiting the evaluation of PT and OT. Will follow progress and notes; also, awaiting a Palliative Care consult.   Of note, [facility] Care Management services does not replace or interfere with any services that are arranged by inpatient case management or social work. For additional questions or referrals please contact:  Natividad Brood, RN BSN [facility] Liaison  [phone] business mobile phone Toll free office [phone]

## 2018-10-05 NOTE — Plan of Care (Signed)
PMT note:    Spoke with daughter [name]. She states she stays with her mother and her sister [name] is with her father. She states her mother ha mild dementia but does have capacity to engage in conversations. She will call our team with a time she can bring her mother to discuss [place], hopefully tomorrow.

## 2018-10-05 NOTE — Progress Notes (Addendum)
Anthony [name] 3:33 PM  Subjective: Asked by both daughters to see patient who is followed by my partner Dr. Paulita Fujita he has a history of Crohn's and his hospital computer chart in our office computer chart was reviewed and his CT in May was negative for any obvious active Crohn's his last colonoscopy was 2015 and endoscopy in 2016 and he was started on prednisone just a week ago for diarrhea and presumed Crohn's flare and inability to afford Entocort and their main question is how quickly can he wean his prednisone he is doing better today during his hospital stay has no GI complaints and is tolerating clear liquids  Objective: Vital signs stable afebrile no acute distress exam pertinent for abdomen soft nontender good bowel sounds labs okay  Assessment: Multiple medical problems  Plan: Okay with me to wean prednisone as quickly as needed since he has only been on it about a week and I will go ahead and lower it  to 20 today and can follow-up as an outpatient with Dr. Paulita Fujita please call us back if we can be of any further assistance with this hospital stay  [facility]  Pager [phone] After 5PM or if no answer call [phone]
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
## 2018-10-05 NOTE — Plan of Care (Signed)
  Problem: Activity: Goal: Risk for activity intolerance will decrease Outcome: Progressing   Problem: Coping: Goal: Level of anxiety will decrease Outcome: Progressing   Problem: Elimination: Goal: Will not experience complications related to bowel motility Outcome: Progressing

## 2018-10-05 NOTE — Plan of Care (Signed)
  Problem: Pain Managment: Goal: General experience of comfort will improve Outcome: Progressing   Problem: Safety: Goal: Ability to remain free from injury will improve Outcome: Progressing   

## 2018-10-05 NOTE — Care Management Note (Signed)
Case Management Note  Patient Details  Name: Anthony [name] MRN: [id number] Date of Birth: [birth year]  Subjective/Objective:                 sepsis   Action/Plan:  Spoke w patient and his daughter at bedside. She states that her and her sister who both live in different towns from patient take turns so that one of them visits with their dad and mom everyday. They also provide transportation for them to appointments and overall help manage their care. They have DME canes, WC, RW at home.   Daughter states they would be interested in [place] if patient were to go home with home health, but were more  interested in SNF eval as she states he cannot stand well on his own. Requested PT OT eval from MD.    Expected Discharge Date:  10/07/18               Expected Discharge Plan:  [place]  In-House Referral:     Discharge planning Services  CM Consult  Post Acute Care Choice:    Choice offered to:     DME Arranged:    DME Agency:     HH Arranged:    HH Agency:     Status of Service:  In process, will continue to follow  If discussed at Long Length of Stay Meetings, dates discussed:    Additional Comments:  Carles Collet, RN 10/05/2018, 10:48 AM

## 2018-10-05 NOTE — Progress Notes (Addendum)
Patient was having shaking episodes.  Vitals as follows.   10/05/18 0911  Vitals  BP (!) 157/71  MAP (mmHg) 94  BP Method Automatic  Pulse Rate 92  Pulse Rate Source Monitor  Oxygen Therapy  SpO2 90 %     10/05/18 0923  Vitals  Temp (!) 100.9 F (38.3 C)  Temp Source Oral  BP 133/75  MAP (mmHg) 89  BP Method Automatic  Pulse Rate 76  Oxygen Therapy  SpO2 91 %   Patient is slight nauseous.   Medications given to patient. Daughter at bedside. MD notified.

## 2018-10-06 DIAGNOSIS — Z7189 Other specified counseling: Secondary | ICD-10-CM

## 2018-10-06 DIAGNOSIS — Z515 Encounter for palliative care: Secondary | ICD-10-CM

## 2018-10-06 LAB — CBC
HCT: 39.9 % (ref 39.0–52.0)
Hemoglobin: 12.6 g/dL — ABNORMAL LOW (ref 13.0–17.0)
MCH: 30.1 pg (ref 26.0–34.0)
MCHC: 31.6 g/dL (ref 30.0–36.0)
MCV: 95.5 fL (ref 80.0–100.0)
NRBC: 0 % (ref 0.0–0.2)
PLATELETS: 232 10*3/uL (ref 150–400)
RBC: 4.18 MIL/uL — AB (ref 4.22–5.81)
RDW: 15.8 % — AB (ref 11.5–15.5)
WBC: 14 10*3/uL — ABNORMAL HIGH (ref 4.0–10.5)

## 2018-10-06 LAB — BASIC METABOLIC PANEL
ANION GAP: 8 (ref 5–15)
BUN: 11 mg/dL (ref 8–23)
CALCIUM: 8.2 mg/dL — AB (ref 8.9–10.3)
CO2: 23 mmol/L (ref 22–32)
CREATININE: 0.86 mg/dL (ref 0.61–1.24)
Chloride: 106 mmol/L (ref 98–111)
GFR calc Af Amer: >60 mL/min (ref >60)
Glucose, Bld: 105 mg/dL — ABNORMAL HIGH (ref 70–99)
Potassium: 3.8 mmol/L (ref 3.5–5.1)
Sodium: 137 mmol/L (ref 135–145)

## 2018-10-06 LAB — VANCOMYCIN, TROUGH: Vancomycin Tr: 14 ug/mL — ABNORMAL LOW (ref 15–20)

## 2018-10-06 MED ORDER — CHLORHEXIDINE GLUCONATE 0.12 % MT SOLN
15.0000 mL | Freq: Two times a day (BID) | OROMUCOSAL | Status: DC
  Administered 2018-10-06 – 2018-10-13 (×14): 15 mL via OROMUCOSAL
  Filled 2018-10-06 (×14): qty 15

## 2018-10-06 MED ORDER — ORAL CARE MOUTH RINSE
15.0000 mL | Freq: Two times a day (BID) | OROMUCOSAL | Status: DC
  Administered 2018-10-06 – 2018-10-13 (×9): 15 mL via OROMUCOSAL

## 2018-10-06 MED ORDER — VANCOMYCIN HCL 10 G IV SOLR
1250.0000 mg | Freq: Two times a day (BID) | INTRAVENOUS | Status: DC
  Administered 2018-10-07: 1250 mg via INTRAVENOUS
  Filled 2018-10-06 (×2): qty 1250

## 2018-10-06 NOTE — Progress Notes (Signed)
Pt rectal temp= 101.8. RN gave pt prn tylenol and notified MD. MD aware. No new orders.

## 2018-10-06 NOTE — Evaluation (Signed)
Occupational Therapy Evaluation Patient Details Name: Anthony [name] MRN: [id number] DOB: 06-Apr-1934 Today's Date: 10/06/2018    History of Present Illness 84yo male with increased weakness and decreased intake. Diagnosed with sepsis. PMH RA, CHF, dysrhythmia, HTN, cardiac cath, knee scope, wrist surgery, dementia   Clinical Impression   Pt lived with his wife and walked with a cane or holding rails within his home. He was assisted for showering, but could feed, dress with difficulty, groom and toilet himself. Pt presents with baseline memory deficits, generalized weakness, decreased standing balance and R side lean in supine. He requires min to max assist for ADL and min assist for mobility. Pt will need post acute rehab in SNF upon discharge. Will follow acutely    Follow Up Recommendations  SNF;Supervision/Assistance - 24 hour    Equipment Recommendations  None recommended by OT    Recommendations for Other Services       Precautions / Restrictions Precautions Precautions: Fall Restrictions Weight Bearing Restrictions: No      Mobility Bed Mobility Overal bed mobility: Needs Assistance Bed Mobility: Supine to Sit;Sit to Supine     Supine to sit: Min assist Sit to supine: Min guard   General bed mobility comments: inefficient, slow movement, min assist to raise trunk  Transfers Overall transfer level: Needs assistance Equipment used: 1 person hand held assist Transfers: Sit to/from [facility] Sit to Stand: Min assist Stand pivot transfers: Min assist       General transfer comment: assist to rise and steady    Balance Overall balance assessment: Needs assistance   Sitting balance-Leahy Scale: Fair     Standing balance support: Single extremity supported Standing balance-Leahy Scale: Poor                             ADL either performed or assessed with clinical judgement   ADL Overall ADL's : Needs  assistance/impaired Eating/Feeding: Minimal assistance;Sitting Eating/Feeding Details (indicate cue type and reason): encouraged pt to get OOB to chair for meals Grooming: Wash/dry hands;Standing;Min guard   Upper Body Bathing: Minimal assistance;Sitting   Lower Body Bathing: Moderate assistance;Sit to/from stand   Upper Body Dressing : Minimal assistance;Sitting   Lower Body Dressing: Moderate assistance;Sit to/from stand Lower Body Dressing Details (indicate cue type and reason): can doff socks, min assist to don, moderate assistance for pants Toilet Transfer: BSC;Minimal assistance;Stand-pivot   Toileting- Clothing Manipulation and Hygiene: Maximal assistance;Sit to/from stand       Functional mobility during ADLs: Minimal assistance(stood and took several side steps to [facility]) General ADL Comments: fatigues easily     Vision Baseline Vision/History: Wears glasses Wears Glasses: At all times Patient Visual Report: No change from baseline       Perception     Praxis      Pertinent Vitals/Pain Pain Assessment: Faces Faces Pain Scale: No hurt     Hand Dominance Right   Extremity/Trunk Assessment Upper Extremity Assessment Upper Extremity Assessment: LUE deficits/detail LUE Deficits / Details: shoulder 3+/5   Lower Extremity Assessment Lower Extremity Assessment: Defer to PT evaluation   Cervical / Trunk Assessment Cervical / Trunk Assessment: Kyphotic   Communication Communication Communication: No difficulties   Cognition Arousal/Alertness: Awake/alert Behavior During Therapy: WFL for tasks assessed/performed Overall Cognitive Status: History of cognitive impairments - at baseline  General Comments: dementia at baseline, has short term memory deficits but otherwise behavior WNL and very pleasant    General Comments       Exercises     Shoulder Instructions      Home Living Family/patient expects to be  discharged to:: Private residence Living Arrangements: Spouse/significant other Available Help at Discharge: Family;Available 24 hours/day Type of Home: House Home Access: Stairs to enter [employer] of Steps: 3 Entrance Stairs-Rails: Can reach both Home Layout: One level     Bathroom Shower/Tub: Occupational psychologist: Handicapped height     Home Equipment: Environmental consultant - 2 wheels;Cane - single point;Toilet riser;Shower seat   Additional Comments: railing in hallway       Prior Functioning/Environment Level of Independence: Needs assistance  Gait / Transfers Assistance Needed: on good days can walk inside home with SPC, on bad days cannot stand up  ADL's / Homemaking Assistance Needed: can do all self care with exception of showering            OT Problem List: Decreased strength;Decreased activity tolerance;Impaired balance (sitting and/or standing);Decreased cognition;Decreased safety awareness;Decreased knowledge of use of DME or AE      OT Treatment/Interventions: Self-care/ADL training;DME and/or AE instruction;Therapeutic activities;Patient/family education;Balance training;Therapeutic exercise    OT Goals(Current goals can be found in the care plan section) Acute Rehab OT Goals Patient Stated Goal: to go to rehab facility  OT Goal Formulation: With patient Time For Goal Achievement: 10/20/18 Potential to Achieve Goals: Good ADL Goals Pt Will Perform Grooming: with supervision;standing(3 activities) Pt Will Perform Upper Body Dressing: with set-up;with supervision;sitting Pt Will Perform Lower Body Dressing: with supervision;sit to/from stand Pt Will Transfer to Toilet: with supervision;ambulating;bedside commode Pt Will Perform Toileting - Clothing Manipulation and hygiene: with supervision;sit to/from stand Pt/caregiver will Perform Home Exercise Program: Increased strength;With theraband;With minimal assist(L shoulder) Additional ADL Goal #1: Pt  will perform bed mobility with supervision in preparation for ADL.  OT Frequency: Min 2X/week   Barriers to D/C: Decreased caregiver support          Co-evaluation              AM-PAC PT "6 Clicks" Daily Activity     Outcome Measure Help from another person eating meals?: A Little Help from another person taking care of personal grooming?: A Little Help from another person toileting, which includes using toliet, bedpan, or urinal?: A Lot Help from another person bathing (including washing, rinsing, drying)?: A Lot Help from another person to put on and taking off regular upper body clothing?: A Little Help from another person to put on and taking off regular lower body clothing?: A Lot 6 Click Score: 15   End of Session Equipment Utilized During Treatment: Gait belt Nurse Communication: Mobility status  Activity Tolerance: Patient tolerated treatment well Patient left: in bed;with call bell/phone within reach;with bed alarm set;with family/visitor present  OT Visit Diagnosis: Unsteadiness on feet (R26.81);Other abnormalities of gait and mobility (R26.89);Muscle weakness (generalized) (M62.81);Other symptoms and signs involving cognitive function;History of falling (Z91.81)                Time: 9735-3299 OT Time Calculation (min): 28 min Charges:  OT General Charges $OT Visit: 1 Visit OT Evaluation $OT Eval Moderate Complexity: 1 Mod OT Treatments $Self Care/Home Management : 8-22 mins  Nestor Lewandowsky, OTR/L Acute Rehabilitation Services Pager: [phone] Office: [phone]  Malka So 10/06/2018, 3:57 PM

## 2018-10-06 NOTE — Consult Note (Addendum)
Consultation Note Date: 10/06/2018   Patient Name: Anthony [name]  DOB: [birth year]  MRN: [id number]  Age / Sex: 84 y.o., male  PCP: Josetta Huddle, MD Referring Physician: Bonnell Public, MD  Reason for Consultation: Establishing goals of care  HPI/Patient Profile: Anthony [name]  is a 84 y.o. male, with past medical history significant for chronic congestive heart failure, systolic, coronary artery disease, rheumatoid arthritis on methotrexate, dementia on Aricept presenting with 2 days history of generalized weakness and decreased p.o. intake.  Clinical Assessment and Goals of Care: Patient is resting in bed. He states his name and DOB. He has dementia at baseline. He lives with his wife Anthony [name] of 79 years. He has two daughters. 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 His daughters come daily to bring food, and do laundry and clean. They state their father is most appropriate for memory care, and their mother has  Mild dementia. Anthony [name] is actively involved in the conversation and is appropriate with her responses. The patient answers questions, but is not talkative.    The couple had moved into assisted living for a while, but he did not like it and they did not have the funds to stay there and moved back home.   The patient describes his life as "boring". Functionally, at times he walks to get the paper. He uses a walker, cane, and wall mounted rails to help with ambulation. He spends 12 hours per day in his recliner which at times he urinates in due to incontinence. He rides with his daughter to pick up food sometimes, but otherwise stays at home unless going to doctors appointments. He has had incontinence for around 1 year. He watches t.v. all day. He falls trying to dress himself because he tries to stand in the holes of his pants and then bend over to pull them up over both legs at the  same time. He takes sink baths. He feeds himself. He snacks during the day, and Coke, coffee and candy are part of his quality of life. He has had coughing for a    We discussed his diagnoses, prognosis, GOC, EOL wishes disposition and options.  A detailed discussion was had today regarding advanced directives.  Concepts specific to code status, artifical feeding and hydration, IV antibiotics and rehospitalization were discussed.  The difference between an aggressive medical intervention path and a comfort care path was discussed.  Values and goals of care important to patient and family were attempted to be elicited.  He states he would never want chest compressions, shocks, or a breathing tube for respiratory distress or cardio/pulmonary failure. He would never want a feeding tube or dialysis. He is amenable to a MBS to assess swallow function. They would then need to discuss quality of life and what changes are recommended.   I completed a MOST form today and the signed original was placed in the chart. A photocopy was placed in the chart to be scanned into EMR. The patient outlined their wishes  for the following treatment decisions:  Cardiopulmonary Resuscitation: Do Not Attempt Resuscitation (DNR/No CPR)  Medical Interventions: Limited Additional Interventions: Use medical treatment, IV fluids and cardiac monitoring as indicated, DO NOT USE intubation or mechanical ventilation. May consider use of less invasive airway support such as BiPAP or CPAP. Also provide comfort measures. Transfer to the hospital if indicated. Avoid intensive care.   Antibiotics: Antibiotics if indicated  IV Fluids: IV fluids if indicated  Feeding Tube: No feeding tube    Daughter [name] follows me out of room and states her mother is angry with her because she called [facility] to evaluate her parents.     SUMMARY OF RECOMMENDATIONS   Recommend MBS to establish swallow function. Following the test we can discuss care moving  forward based on his wishes.    Code Status/Advance Care Planning:  DNR    Symptom Management:   No complaints  Palliative Prophylaxis:   Eye Care and Oral Care  Prognosis:   Unable to determine  Discharge Planning: To Be Determined      Primary Diagnoses: Present on Admission: . Sepsis ([place])   I have reviewed the medical record, interviewed the patient and family, and examined the patient. The following aspects are pertinent.  Past Medical History:  Diagnosis Date  . Arthritis    rheumatoid-  Dr Barkley Boards  . CHF (congestive heart failure) (HCC)    chronic systolic heart failure per Dr [name] L[facility] note 09/23/11.   EKG 4/12, stress test  and cath from 8/12 on chart.  Clearance with LOV Dr [name] on chart  . Coronary artery disease   . Dysrhythmia    nonsustained,asymptomatic VT per Dr [name] office note  resulting in cath  . GERD (gastroesophageal reflux disease)   . Heart attack ([place]) 1996, 06/2011  . Hyperlipidemia   . Hypertension   . Pneumonia 1993  . Prostate troubles    states take alternating meds for bladder/prostate control- "age thing"  . Sleep apnea    sleep study years ago- has apnea but did not qualify for CPAP   Social History   Socioeconomic History  . Marital status: Married    Spouse name: Not on file  . Number of children: Not on file  . Years of education: Not on file  . Highest education level: Not on file  Occupational History  . Not on file  Social Needs  . Financial resource strain: Not on file  . Food insecurity:    Worry: Not on file    Inability: Not on file  . Transportation needs:    Medical: Not on file    Non-medical: Not on file  Tobacco Use  . Smoking status: Former Smoker    Packs/day: 3.00    Years: 20.00    Pack years: 60.00    Types: Cigarettes    Last attempt to quit: 11/01/1970    Years since quitting: 47.9  . Smokeless tobacco: Never Used  . Tobacco comment: quit 1967  Substance and Sexual Activity  .  Alcohol use: No  . Drug use: No  . Sexual activity: Not on file  Lifestyle  . Physical activity:    Days per week: Not on file    Minutes per session: Not on file  . Stress: Not on file  Relationships  . Social connections:    Talks on phone: Not on file    Gets together: Not on file    Attends religious service: Not on file  Active member of club or organization: Not on file    Attends meetings of clubs or organizations: Not on file    Relationship status: Not on file  Other Topics Concern  . Not on file  Social History Narrative  . Not on file   Family History  Problem Relation Age of Onset  . Heart disease Mother   . Rheum arthritis Mother   . Emphysema Father    Scheduled Meds: . carvedilol  3.125 mg Oral BID WC  . chlorhexidine  15 mL Mouth Rinse BID  . donepezil  5 mg Oral QHS  . enoxaparin (LOVENOX) injection  40 mg Subcutaneous Q24H  . famotidine  40 mg Oral QHS  . feeding supplement (ENSURE ENLIVE)  237 mL Oral BID BM  . FLUoxetine  20 mg Oral Daily  . folic acid  1 mg Oral QPM  . loperamide  2 mg Oral QPM  . magnesium oxide  200 mg Oral Daily  . mouth rinse  15 mL Mouth Rinse q12n4p  . mesalamine  4.8 g Oral Q breakfast  . nystatin  5 mL Mouth/Throat QID  . oxybutynin  5 mg Oral BID  . pravastatin  40 mg Oral QHS  . predniSONE  20 mg Oral Q breakfast  . tamsulosin  0.4 mg Oral QPC supper  . vitamin B-12  500 mcg Oral QPM   Continuous Infusions: . ceFEPime (MAXIPIME) IV 2 g (10/06/18 0925)  . vancomycin 1,000 mg (10/06/18 0631)   PRN Meds:.acetaminophen, guaiFENesin-dextromethorphan, nitroGLYCERIN Medications Prior to Admission:  Prior to Admission medications   Medication Sig Start Date End Date Taking? Authorizing Provider  acetaminophen (TYLENOL) 500 MG tablet Take 500 mg by mouth 2 (two) times daily.   Yes [provider]  CALCIUM-VITAMIN D PO Take 1 tablet by mouth every evening.    Yes [provider]  carvedilol (COREG) 6.25  MG tablet TAKE 1 TABLET TWICE A DAY WITH A MEAL. Patient taking differently: Take 6.25 mg by mouth 2 (two) times daily with a meal.  02/22/18  Yes Burtis Junes, NP  Cholecalciferol (VITAMIN D3) 50 MCG (2000 UT) capsule Take 2,000 Units by mouth every morning.   Yes [provider]  Coenzyme Q10 (CO Q-10) 100 MG CAPS Take 100 mg by mouth every evening.    Yes [provider]  Cyanocobalamin (B-12) 500 MCG TABS Take 500 mcg by mouth every evening.   Yes [provider]  donepezil (ARICEPT) 5 MG tablet Take 5 mg by mouth at bedtime. 08/15/18  Yes [provider]  famotidine (PEPCID) 20 MG tablet Take 40 mg by mouth at bedtime.   Yes [provider]  feeding supplement, ENSURE ENLIVE, (ENSURE ENLIVE) LIQD Take 237 mLs by mouth 2 (two) times daily between meals. 04/07/18  Yes Debbe Odea, MD  FLUoxetine (PROZAC) 20 MG capsule Take 20 mg by mouth every morning.  09/06/16  Yes [provider]  folic acid (FOLVITE) 146 MCG tablet Take 400 mcg by mouth every evening.    Yes [provider]  loperamide (IMODIUM) 2 MG capsule Take 2 mg by mouth every evening.   Yes [provider]  magnesium oxide (MAG-OX) 400 MG tablet Take 200 mg by mouth daily.    Yes [provider]  mesalamine (LIALDA) 1.2 G EC tablet Take 4.8 g by mouth daily with breakfast.    Yes [provider]  methotrexate (RHEUMATREX) 2.5 MG tablet Take 8 tablets by mouth Once  a week. Wednesday Take 8 tablets=20 mg. 07/02/11  Yes [provider]  nitroGLYCERIN (NITROSTAT) 0.4 MG SL tablet Place 0.4 mg under the tongue every 5 (five) minutes as needed. Chest pain    Yes [provider]  oxybutynin (DITROPAN) 5 MG tablet Take 5 mg by mouth 2 (two) times daily. 09/20/18  Yes [provider]  pravastatin (PRAVACHOL) 40 MG tablet Take 1 tablet (40 mg total) by mouth at bedtime. 03/08/18  Yes Belva Crome, MD  predniSONE (DELTASONE) 10  MG tablet Take 30 mg by mouth daily with breakfast.   Yes [provider]  tamsulosin (FLOMAX) 0.4 MG CAPS capsule Take 0.4 mg by mouth daily after supper.  10/01/14  Yes [provider]  vitamin A 10000 UNIT capsule Take 10,000 Units by mouth every evening.    Yes [provider]  vitamin E 400 UNIT capsule Take 400 Units by mouth every evening.    Yes [provider]  polyethylene glycol (MIRALAX / GLYCOLAX) packet Take 17 g by mouth daily as needed for mild constipation. Patient not taking: Reported on 04/11/2018 04/07/18   Debbe Odea, MD  senna (SENOKOT) 8.6 MG TABS tablet Take 1 tablet (8.6 mg total) by mouth 2 (two) times daily. Patient not taking: Reported on 04/11/2018 04/07/18   Debbe Odea, MD   No Known Allergies Review of Systems  All other systems reviewed and are negative.   Physical Exam  Constitutional: No distress.  Pulmonary/Chest: Effort normal.  Neurological: He is alert.  Skin: Skin is warm and dry.    Vital Signs: BP (!) 108/47 (BP Location: Left Arm)   Pulse 96   Temp (!) 102.8 F (39.3 C) (Oral)   Resp 18   Ht 6' 1"  (1.854 m)   Wt 86.1 kg   SpO2 91%   BMI 25.04 kg/m  Pain Scale: 0-10   Pain Score: 0-No pain   SpO2: SpO2: 91 % O2 Device:SpO2: 91 % O2 Flow Rate: .   IO: Intake/output summary:   Intake/Output Summary (Last 24 hours) at 10/06/2018 1345 Last data filed at 10/06/2018 1134 Gross per 24 hour  Intake 1404.73 ml  Output 5250 ml  Net -3845.27 ml    LBM: Last BM Date: 10/05/18 Baseline Weight: Weight: 81.6 kg Most recent weight: Weight: 86.1 kg     Palliative Assessment/Data: 40%     Time In: 12:00 Time Out: 2:10 Time Total: 130 min Greater than 50%  of this time was spent counseling and coordinating care related to the above assessment and plan.  Signed by: Asencion Gowda, NP   Please contact Palliative Medicine Team phone at [phone] for questions and concerns.  For individual  provider: See Shea Evans

## 2018-10-06 NOTE — Progress Notes (Signed)
RN rounded on pt. Pt and daughter state they do not need anything at this time.

## 2018-10-06 NOTE — Plan of Care (Signed)
  Problem: Education: Goal: Knowledge of General Education information will improve Description Including pain rating scale, medication(s)/side effects and non-pharmacologic comfort measures Outcome: Progressing   Problem: Health Behavior/Discharge Planning: Goal: Ability to manage health-related needs will improve Outcome: Progressing   Problem: Clinical Measurements: Goal: Ability to maintain clinical measurements within normal limits will improve Outcome: Progressing   Problem: Activity: Goal: Risk for activity intolerance will decrease Outcome: Progressing   Problem: Nutrition: Goal: Adequate nutrition will be maintained Outcome: Progressing   Problem: Coping: Goal: Level of anxiety will decrease Outcome: Progressing   Problem: Safety: Goal: Ability to remain free from injury will improve Outcome: Progressing   Problem: Skin Integrity: Goal: Risk for impaired skin integrity will decrease Outcome: Progressing

## 2018-10-06 NOTE — Progress Notes (Signed)
Pt rectal temp= 102.8. MD is aware. No new orders.

## 2018-10-06 NOTE — Progress Notes (Signed)
PROGRESS NOTE   Anthony [name]  OEV:[id number]    DOB: 1934/04/03    DOA: 10/04/2018  PCP: Josetta Huddle, MD   I have briefly reviewed patients previous medical records in [facility].  Brief Narrative:  84 year old male, lives with family, ambulates with the help of a cane, PMH of RA on chronic methotrexate, chronic systolic CHF, CAD, NSVT, GERD, HLD, HTN, OSA, dementia, Crohn's disease for which he was started on prednisone just a week ago for diarrhea and suspected Crohn's flare and inability to afford Entocort, reportedly sustained a mechanical fall a week prior to admission, presented with progressively weakness, decreased oral intake, dry cough, low-grade fevers up to 101 F, dizzy and lightheaded at times, worsening of chronic urinary incontinence and transient diarrhea that has resolved.  He was admitted for suspected sepsis of unclear source, dehydration, oral thrush and deconditioning.  10/06/2018: Patient continues to have fever and chills.  Seen alongside patient's daughter.  Awaiting palliative care input.   Assessment & Plan:   Active Problems:   Sepsis ([place])   Suspected sepsis: Unclear source.  Febrile up to 101.64F rectally this morning.  Transiently hypotensive and tachypneic in ED.  Lactate normal.  Flu panel PCR negative.  UA not suggestive of UTI.  Group A strep by PCR negative.  Chest x-ray shows bibasilar atelectasis.  Blood cultures x2: Negative to date.  Since patient is immunocompromised on prednisone and MTX, empirically started on broad-spectrum antibiotics including IV vancomycin, cefepime and Flagyl.  DC Flagyl. 10/06/2018: Patient continues to have fever.  Worsening leukocytosis.  WBC is 14,000 today.  We will proceed with procalcitonin level, ESR, CRP, ANA and CT scan of the chest without contrast.  Further work-up will depend on hospital course and goal of care.  Oral thrush: In the context of chronic steroids.  Nystatin swish and swallow.  Dehydration  with hyponatremia:  Secondary to poor oral intake.  IV normal saline hydration. 10/06/2018: Hyponatremia has resolved.  Rheumatoid arthritis:  Hold methotrexate.   Not on chronic prednisone as per family's report.   Follows with rheumatologist outpatient.   Patient follow-up with rheumatology  Dementia: Continue Aricept.  Essential hypertension: Soft blood pressures.  Reduce carvedilol to 3.125 mg twice daily and titrate up as blood pressures improved.  Hyperlipidemia: Continue statins.  Chronic systolic CHF: Clinically dehydrated.  Gently hydrate and follow closely.  CAD: Asymptomatic of chest pain.  GERD: Continue Pepcid.  OSA: Reportedly did not qualify for CPAP.  Mechanical fall: PT evaluated and recommend SNF.  PT also noted coughing and mild choking when drinking water and recommended speech therapy consult, placed.  Bilateral thigh rash:  This is most likely erythema ab igne (Chronic rash over bilateral thighs in a honeycomb pattern reportedly due to chronic use of hot water bag for raynauds disease)  History of Crohn's disease: As per family's report, started on prednisone a week ago because he was unable to afford Entocort and has not been on prednisone chronically.  [facility] consulted and rapidly tapering prednisone to DC over the next 2 days.  I discussed with Dr. Watt Climes.  Continue Lialda.   DVT prophylaxis: Lovenox Code Status: Full Family Communication: Discussed in detail with patient's daughter at bedside. Disposition: DC to SNF pending clinical improvement.   Consultants:  [facility]  Procedures:  None  Antimicrobials:  IV vancomycin and cefepime Discontinued Flagyl   Subjective: No history from the patient. Patient continues to have fever.  Objective:  Vitals:   10/06/18  1102 10/06/18 0828 10/06/18 0926 10/06/18 1200  BP: 136/67 (!) 134/99  (!) 108/47  Pulse: 75 (!) 105  96  Resp: 18   18  Temp: 98.3 F (36.8 C)  (!) 101.8 F (38.8 C) (!)  102.8 F (39.3 C)  TempSrc: Oral  Rectal Oral  SpO2: 91%   91%  Weight: 86.1 kg     Height:        Examination:  General exam: Elderly male, moderately built and thinly nourished, frail and chronically ill looking lying comfortably propped up in bed.   ENT: Oral mucosa dry.  Thrush noted on tongue.  Mild patchy erythema of posterior pharyngeal wall without exudate or ulcers. Respiratory system: Clear to auscultation. Respiratory effort normal. Cardiovascular system: S1 & S2 heard, RRR. No JVD, murmurs, rubs, gallops or clicks. No pedal edema.  Telemetry personally reviewed: Sinus rhythm. Gastrointestinal system: Abdomen is nondistended, soft and nontender. No organomegaly or masses felt. Normal bowel sounds heard. Central nervous system: Alert and oriented to self and partly to place. No focal neurological deficits. Extremities: Symmetric 5 x 5 power. Skin: Chronic rash on bilateral thighs in a honeycomb pattern reportedly due to use of hot water bag for Raynaud's. Psychiatry: Judgement and insight appear impaired. Mood & affect flat.     Data Reviewed: I have personally reviewed following labs and imaging studies  CBC: Recent Labs  Lab 10/04/18 1516 10/04/18 1526 10/06/18 0306  WBC 10.7*  --  14.0*  NEUTROABS 9.3*  --   --   HGB 13.9 14.3 12.6*  HCT 41.8 42.0 39.9  MCV 95.0  --  95.5  PLT 254  --  111   Basic Metabolic Panel: Recent Labs  Lab 10/04/18 1516 10/04/18 1526 10/05/18 0427 10/06/18 0306  NA 131* 130* 134* 137  K 4.1 4.2 3.7 3.8  CL 99 98 106 106  CO2 24  --  23 23  GLUCOSE 175* 175* 127* 105*  BUN _0 CREATININE 1.04 1.00 0.94 0.86  CALCIUM 8.3*  --  7.9* 8.2*   Liver Function Tests: Recent Labs  Lab 10/04/18 1516  AST 18  ALT 21  ALKPHOS 69  BILITOT 1.5*  PROT 5.7*  ALBUMIN 2.8*   CBG: Recent Labs  Lab 10/05/18 0911  GLUCAP 82    Recent Results (from the past 240 hour(s))  Blood Culture (routine x 2)     Status: None  (Preliminary result)   Collection Time: 10/04/18  3:16 PM  Result Value Ref Range Status   Specimen Description BLOOD RIGHT WRIST  Final   Special Requests   Final    BOTTLES DRAWN AEROBIC AND ANAEROBIC Blood Culture adequate volume   Culture   Final    NO GROWTH 2 DAYS Performed at [facility], [place] [address], [place], [place] [address]    Report Status PENDING  Incomplete  Group A Strep by PCR     Status: None   Collection Time: 10/04/18  3:31 PM  Result Value Ref Range Status   Group A Strep by PCR NOT DETECTED NOT DETECTED Final    Comment: Performed at [facility], [place] [address], [place], [place] [address]  Blood Culture (routine x 2)     Status: None (Preliminary result)   Collection Time: 10/04/18  4:36 PM  Result Value Ref Range Status   Specimen Description BLOOD LEFT HAND  Final   Special Requests   Final    BOTTLES  DRAWN AEROBIC AND ANAEROBIC Blood Culture adequate volume   Culture   Final    NO GROWTH 2 DAYS Performed at [facility], [place] [address] [place], [place] [address]    Report Status PENDING  Incomplete  Urine culture     Status: None   Collection Time: 10/04/18  4:46 PM  Result Value Ref Range Status   Specimen Description URINE, CLEAN CATCH  Final   Special Requests NONE  Final   Culture   Final    NO GROWTH Performed at [facility], [place] [address] [place], [place] [address]    Report Status 10/05/2018 FINAL  Final         Radiology Studies: No results found.      Scheduled Meds: . carvedilol  3.125 mg Oral BID WC  . chlorhexidine  15 mL Mouth Rinse BID  . donepezil  5 mg Oral QHS  . enoxaparin (LOVENOX) injection  40 mg Subcutaneous Q24H  . famotidine  40 mg Oral QHS  . feeding supplement (ENSURE ENLIVE)  237 mL Oral BID BM  . FLUoxetine  20 mg Oral Daily  . folic acid  1 mg Oral QPM  . loperamide  2 mg Oral QPM  . magnesium oxide  200 mg Oral Daily  . mouth rinse  15 mL Mouth Rinse q12n4p  .  mesalamine  4.8 g Oral Q breakfast  . nystatin  5 mL Mouth/Throat QID  . oxybutynin  5 mg Oral BID  . pravastatin  40 mg Oral QHS  . predniSONE  20 mg Oral Q breakfast  . tamsulosin  0.4 mg Oral QPC supper  . vitamin B-12  500 mcg Oral QPM   Continuous Infusions: . ceFEPime (MAXIPIME) IV 2 g (10/06/18 0925)  . vancomycin 1,000 mg (10/06/18 0631)     LOS: 2 days     Bonnell Public, MD. [facility] Pager [phone]  If 7PM-7AM, please contact night-coverage [email or web address] Password [email or web address] 10/06/2018, 4:05 PM

## 2018-10-06 NOTE — Progress Notes (Signed)
RN placed DNR bracelet on pt per order.

## 2018-10-06 NOTE — Progress Notes (Signed)
Pharmacy Antibiotic Note  Anthony [name] is a 84 y.o. male admitted on 10/04/2018 with sepsis.  Pharmacy has been consulted for cefepime and vancomycin dosing.  On day #3 of antibiotics. WBC 14- on concurrent steroids. Tmax 102.8. Vancomycin trough ~10.5 hours after last dose came back slightly below goal range at 14. Scr stable at 0.86 today (CrCl ~74 mL/min). No growth on cx to date.  Plan: Continue cefepime 2 gm IV q12h Increase vancomycin to 1250 mg IV q12h Monitor renal function and vancomycin trough at steady-state F/u cultures and LOT   Height: 6' 1"  (185.4 cm) Weight: 189 lb 13.1 oz (86.1 kg) IBW/kg (Calculated) : 79.9  Temp (24hrs), Avg:99.6 F (37.6 C), Min:97.5 F (36.4 C), Max:102.8 F (39.3 C)  Recent Labs  Lab 10/04/18 1516 10/04/18 1526 10/04/18 1527 10/05/18 0427 10/06/18 0306 10/06/18 1703  WBC 10.7*  --   --   --  14.0*  --   CREATININE 1.04 1.00  --  0.94 0.86  --   LATICACIDVEN  --   --  1.48  --   --   --   VANCOTROUGH  --   --   --   --   --  14*    Estimated Creatinine Clearance: 74.8 mL/min (by C-G formula based on SCr of 0.86 mg/dL).    No Known Allergies  Antimicrobials this admission: Cefepime 11/12>> Vancomycin 11/12>> Metronidazole 11/12>>  Adjustments to Regimen: VT 11/14 14 (10.5 hours after last dose) - increase to 1250 mg IV q12 hr  Microbiology results: 11/12 UCx: 11/12 BCx: 11/12 GAS PCR:   Thank you for allowing pharmacy to be a part of this patient's care.  Antonietta Jewel, PharmD Clinical Pharmacist  Pager: [phone] Phone: [phone]

## 2018-10-06 NOTE — Plan of Care (Signed)
  Problem: Activity: Goal: Risk for activity intolerance will decrease Outcome: Progressing   Problem: Pain Managment: Goal: General experience of comfort will improve Outcome: Progressing   Problem: Safety: Goal: Ability to remain free from injury will improve Outcome: Progressing   

## 2018-10-06 NOTE — Evaluation (Signed)
Clinical/Bedside Swallow Evaluation Patient Details  Name: Anthony [name] MRN: [id number] Date of Birth: 1934-06-25  Today's Date: 10/06/2018 Time: SLP Start Time (ACUTE ONLY): 0851 SLP Stop Time (ACUTE ONLY): 0915 SLP Time Calculation (min) (ACUTE ONLY): 24 min  Past Medical History:  Past Medical History:  Diagnosis Date  . Arthritis    rheumatoid-  Dr Barkley Boards  . CHF (congestive heart failure) (HCC)    chronic systolic heart failure per Dr [name] LOV note 09/23/11.   EKG 4/12, stress test  and cath from 8/12 on chart.  Clearance with LOV Dr [name] on chart  . Coronary artery disease   . Dysrhythmia    nonsustained,asymptomatic VT per Dr [name] office note  resulting in cath  . GERD (gastroesophageal reflux disease)   . Heart attack ([place]) 1996, 06/2011  . Hyperlipidemia   . Hypertension   . Pneumonia 1993  . Prostate troubles    states take alternating meds for bladder/prostate control- "age thing"  . Sleep apnea    sleep study years ago- has apnea but did not qualify for CPAP   Past Surgical History:  Past Surgical History:  Procedure Laterality Date  . CARDIAC CATHETERIZATION     1996/ 2012 report on chart  . COLONOSCOPY    . INGUINAL HERNIA REPAIR  11/04/2011   Procedure: HERNIA REPAIR INGUINAL ADULT;  Surgeon: Pedro Earls, MD;  Location: [facility];  Service: General;  Laterality: Right;  Right Inguinal Hernia Repair with Mesh  . KNEE ARTHROSCOPY Right 1976  . MENISCUS DEBRIDEMENT Left 1996  . TRIGGER FINGER RELEASE Right 10/10/2009  . UMBILICAL HERNIA REPAIR  2009  . WRIST SURGERY Left 04/25/91   HPI:  Anthony [name] is a 84 y.o. male who presented to [facility] ED (from home) 10/04/18 with cough and generalized worsening weakness over past 2 days, has been unable to eat or drink. PMH significant for dementia, pneumonia, GERD, CHF, CAD, HTN, hyperlipdemia, heart attack (1996, 2012), sleep apnea. CXR showed bibasilar atelectasis.   Assessment / Plan /  Recommendation Clinical Impression  Mr. Olivencia exhibited mild indications of questionable pharyneal dysphagia during 3 oz water assessment. He was unable to consistently consume volume of water with immediate and delayed throat clears throughout assessment. Daughter present confirms reflux, not problematic at home and receiving meds in hospital with denial of prior oral or pharyngeal dysphagia. History of dementia, currently confused, (+) febrile with sepsis will increase risks. May need instrumental assessment if s/s continue. Daughter feeding pt when therapist arrived with head of bed significantly reclined. Explained results of  evaluation including safer positioning with all food/liquid, Dys 3 texture/ordering from menu and treatment plan to follow up tomorrow. If s/s aspiration continue, will recommend MBS to differentiate oropharyngeal vs possible esophageal source and need for additional work up.   SLP Visit Diagnosis: Dysphagia, unspecified (R13.10)    Aspiration Risk  Mild aspiration risk;Moderate aspiration risk    Diet Recommendation Dysphagia 3 (Mech soft);Thin liquid   Liquid Administration via: Straw;Cup Medication Administration: Whole meds with puree Supervision: Full supervision/cueing for compensatory strategies;Staff to assist with self feeding Compensations: Slow rate;Small sips/bites;Minimize environmental distractions Postural Changes: Seated upright at 90 degrees;Remain upright for at least 30 minutes after po intake    Other  Recommendations Oral Care Recommendations: Oral care BID   Follow up Recommendations Other (comment)(TBD)      Frequency and Duration min 2x/week  2 weeks       Prognosis Prognosis for Safe Diet Advancement: (fair-good)  Barriers to Reach Goals: Cognitive deficits      Swallow Study   General HPI: Anthony [name] is a 84 y.o. male who presented to [facility] ED (from home) 10/04/18 with cough and generalized worsening weakness over past 2 days,  has been unable to eat or drink. PMH significant for dementia, pneumonia, GERD, CHF, CAD, HTN, hyperlipdemia, heart attack (1996, 2012), sleep apnea. CXR showed bibasilar atelectasis. Type of Study: Bedside Swallow Evaluation Previous Swallow Assessment: none Diet Prior to this Study: Thin liquids(clear liquids) Temperature Spikes Noted: Yes(101.1) Respiratory Status: Room air History of Recent Intubation: No Behavior/Cognition: Alert;Confused;Requires cueing;Cooperative;Pleasant mood Oral Cavity Assessment: Other (comment)(soft palate candidias) Oral Care Completed by SLP: No Oral Cavity - Dentition: Poor condition;Missing dentition Vision: Functional for self-feeding Self-Feeding Abilities: Total assist Patient Positioning: Upright in bed Baseline Vocal Quality: Normal Volitional Cough: Strong Volitional Swallow: Able to elicit    Oral/Motor/Sensory Function Overall Oral Motor/Sensory Function: Within functional limits   Ice Chips Ice chips: Not tested   Thin Liquid Thin Liquid: Impaired Presentation: Cup;Straw Oral Phase Impairments: (none) Pharyngeal  Phase Impairments: Throat Clearing - Immediate;Throat Clearing - Delayed    Nectar Thick Nectar Thick Liquid: Not tested   Honey Thick Honey Thick Liquid: Not tested   Puree Puree: Not tested   Solid     Solid: Within functional limits      Anthony [name] 10/06/2018,10:32 AM  Anthony [name].Ed Risk analyst [phone] Office [phone]

## 2018-10-07 ENCOUNTER — Encounter (HOSPITAL_COMMUNITY): Payer: Self-pay | Admitting: Family Medicine

## 2018-10-07 ENCOUNTER — Inpatient Hospital Stay (HOSPITAL_COMMUNITY): Payer: PPO

## 2018-10-07 DIAGNOSIS — K219 Gastro-esophageal reflux disease without esophagitis: Secondary | ICD-10-CM

## 2018-10-07 DIAGNOSIS — I5020 Unspecified systolic (congestive) heart failure: Secondary | ICD-10-CM

## 2018-10-07 DIAGNOSIS — F0390 Unspecified dementia without behavioral disturbance: Secondary | ICD-10-CM

## 2018-10-07 DIAGNOSIS — Z79899 Other long term (current) drug therapy: Secondary | ICD-10-CM

## 2018-10-07 DIAGNOSIS — R05 Cough: Secondary | ICD-10-CM

## 2018-10-07 DIAGNOSIS — R509 Fever, unspecified: Secondary | ICD-10-CM

## 2018-10-07 DIAGNOSIS — I110 Hypertensive heart disease with heart failure: Secondary | ICD-10-CM

## 2018-10-07 DIAGNOSIS — Z9181 History of falling: Secondary | ICD-10-CM

## 2018-10-07 DIAGNOSIS — G4733 Obstructive sleep apnea (adult) (pediatric): Secondary | ICD-10-CM

## 2018-10-07 DIAGNOSIS — I471 Supraventricular tachycardia: Secondary | ICD-10-CM

## 2018-10-07 DIAGNOSIS — R5381 Other malaise: Secondary | ICD-10-CM

## 2018-10-07 DIAGNOSIS — J9811 Atelectasis: Secondary | ICD-10-CM

## 2018-10-07 DIAGNOSIS — K5090 Crohn's disease, unspecified, without complications: Secondary | ICD-10-CM

## 2018-10-07 DIAGNOSIS — M791 Myalgia, unspecified site: Secondary | ICD-10-CM

## 2018-10-07 DIAGNOSIS — R918 Other nonspecific abnormal finding of lung field: Secondary | ICD-10-CM

## 2018-10-07 DIAGNOSIS — Z87891 Personal history of nicotine dependence: Secondary | ICD-10-CM

## 2018-10-07 DIAGNOSIS — M069 Rheumatoid arthritis, unspecified: Secondary | ICD-10-CM

## 2018-10-07 DIAGNOSIS — R197 Diarrhea, unspecified: Secondary | ICD-10-CM

## 2018-10-07 DIAGNOSIS — I2510 Atherosclerotic heart disease of native coronary artery without angina pectoris: Secondary | ICD-10-CM

## 2018-10-07 DIAGNOSIS — E785 Hyperlipidemia, unspecified: Secondary | ICD-10-CM

## 2018-10-07 LAB — PROCALCITONIN: Procalcitonin: 0.19 ng/mL

## 2018-10-07 LAB — CBC WITH DIFFERENTIAL/PLATELET
Abs Immature Granulocytes: 0.08 10*3/uL — ABNORMAL HIGH (ref 0.00–0.07)
Basophils Absolute: 0 10*3/uL (ref 0.0–0.1)
Basophils Relative: 0 %
Eosinophils Absolute: 0.5 10*3/uL (ref 0.0–0.5)
Eosinophils Relative: 5 %
HCT: 36.8 % — ABNORMAL LOW (ref 39.0–52.0)
Hemoglobin: 11.8 g/dL — ABNORMAL LOW (ref 13.0–17.0)
Immature Granulocytes: 1 %
Lymphocytes Relative: 7 %
Lymphs Abs: 0.7 10*3/uL (ref 0.7–4.0)
MCH: 30.3 pg (ref 26.0–34.0)
MCHC: 32.1 g/dL (ref 30.0–36.0)
MCV: 94.4 fL (ref 80.0–100.0)
Monocytes Absolute: 0.6 10*3/uL (ref 0.1–1.0)
Monocytes Relative: 6 %
Neutro Abs: 8.2 10*3/uL — ABNORMAL HIGH (ref 1.7–7.7)
Neutrophils Relative %: 81 %
Platelets: 245 10*3/uL (ref 150–400)
RBC: 3.9 MIL/uL — ABNORMAL LOW (ref 4.22–5.81)
RDW: 15.8 % — ABNORMAL HIGH (ref 11.5–15.5)
WBC: 10.2 10*3/uL (ref 4.0–10.5)
nRBC: 0 % (ref 0.0–0.2)

## 2018-10-07 LAB — C-REACTIVE PROTEIN: CRP: 8.6 mg/dL — ABNORMAL HIGH (ref <1.0)

## 2018-10-07 LAB — MRSA PCR SCREENING: MRSA by PCR: NEGATIVE

## 2018-10-07 LAB — SEDIMENTATION RATE: Sed Rate: 37 mm/hr — ABNORMAL HIGH (ref 0–16)

## 2018-10-07 MED ORDER — ALPRAZOLAM 0.25 MG PO TABS
0.2500 mg | ORAL_TABLET | Freq: Once | ORAL | Status: AC
  Administered 2018-10-07: 0.25 mg via ORAL
  Filled 2018-10-07: qty 1

## 2018-10-07 NOTE — Consult Note (Signed)
[facility] for [facility]ious [facility]    Date of Admission:  10/04/2018   Total days of antibiotics 3        Day 1 vanc, flagyl, cefepime        Day 2 flagyl stopped               Reason for Consult: Fever of unknown origin    Referring Physician: Dana Allan Primary Care Physician: Josetta Huddle, MD  Active Problems:   Sepsis ([place])   . carvedilol  3.125 mg Oral BID WC  . chlorhexidine  15 mL Mouth Rinse BID  . donepezil  5 mg Oral QHS  . enoxaparin (LOVENOX) injection  40 mg Subcutaneous Q24H  . famotidine  40 mg Oral QHS  . feeding supplement (ENSURE ENLIVE)  237 mL Oral BID BM  . FLUoxetine  20 mg Oral Daily  . folic acid  1 mg Oral QPM  . loperamide  2 mg Oral QPM  . magnesium oxide  200 mg Oral Daily  . mouth rinse  15 mL Mouth Rinse q12n4p  . mesalamine  4.8 g Oral Q breakfast  . nystatin  5 mL Mouth/Throat QID  . oxybutynin  5 mg Oral BID  . pravastatin  40 mg Oral QHS  . predniSONE  20 mg Oral Q breakfast  . tamsulosin  0.4 mg Oral QPC supper  . vitamin B-12  500 mcg Oral QPM    Recommendations: Recurrent fever 2/2 most likely viral illness 1. D/c vancomycin, cefepime 2. Recommend supportive care with adequate hydration and tylenol for anti-pyretic effect  Assessment: Mr.Hendricksen is a 84 yo M w/ PMH of rheumatoid arthritis on methotrexate, systolic CHF, Crohn's disease and dementia admit for fever, malaise and weakness. Mr.Kidd has stable blood pressure, is oxygenating well and had normal lactate on admission and procalcitonin is not elevated. He has elevated ESR and CRP but he also has diagnosis of RA, Crohn's and has been on prednisone. Although his chest CT show bilateral ground glass opacities but no lobar consolidation or pleural effusion. These findings above makes a viral illness more likely than bacterial and therefore we recommend discontinuing his antibiotic therapy and providing supportive care for Mr.Kang.  HPI: KIEGAN MACARAEG is a 84 y.o. male w/ PMH of rheumatoid arthritis, systolic CHF, CAD, NSVT, GERD, HLD, HTN, OSA, Crohn's disease and dementia. He was examined and evaluated at bedside this PM with family present. He is AAOx2 and able to answer questions but states his memory is foggy and has trouble providing details. Additional information was obtained from his family members at bedside. He was in his usual state of health until about a week ago when his prescription cost for Entocort increased substantially and he was started on prednisone per his GI, Dr.Outlaw. He took the prednisone for about a week for his Crohn's flare up which involved significant episodes of diarrhea and developed thrush while on the steroids. 2 days ago he began to exhibit malaise, weakness and chills and was brought to the ED for evaluation. He denies any sick contact. He has not gotten his flu shot this year.  He was found to have mildly elevated white count with tachycardia and was admitted for sepsis work up. UA was negative. Chest X-ray showed some atelectasis. Blood cultures were drawn which was negative. Vanc, flagyl and cefepime was started. He continued to be febrile during his hospitalization despite antibiotic therapy and infectious disease was consulted  for work up for fever of unknown origin.   Review of Systems: Review of Systems  Constitutional: Positive for chills, fever and malaise/fatigue. Negative for diaphoresis.  HENT: Negative for ear discharge.   Eyes: Negative for blurred vision and discharge.  Respiratory: Positive for cough (chronic dry cough). Negative for sputum production and shortness of breath.   Cardiovascular: Negative for chest pain, palpitations and leg swelling.  Gastrointestinal: Positive for diarrhea. Negative for abdominal pain, constipation, nausea and vomiting.  Genitourinary: Negative for dysuria, frequency and urgency.  Musculoskeletal: Positive for falls (fell while changing about a week ago,  may have hit his head but unsure). Negative for joint pain.  Skin: Negative for rash.  Neurological: Negative for dizziness, sensory change, weakness and headaches.    Past Medical History:  Diagnosis Date  . Arthritis    rheumatoid-  Dr Barkley Boards  . CHF (congestive heart failure) (HCC)    chronic systolic heart failure per Dr [name] LOV note 09/23/11.   EKG 4/12, stress test  and cath from 8/12 on chart.  Clearance with LOV Dr [name] on chart  . Coronary artery disease   . Crohn disease ([place])   . Dysrhythmia    nonsustained,asymptomatic VT per Dr [name] office note  resulting in cath  . GERD (gastroesophageal reflux disease)   . Heart attack ([place]) 1996, 06/2011  . Hyperlipidemia   . Hypertension   . Pneumonia 1993  . Prostate troubles    states take alternating meds for bladder/prostate control- "age thing"  . Sleep apnea    sleep study years ago- has apnea but did not qualify for CPAP    Social History   Tobacco Use  . Smoking status: Former Smoker    Packs/day: 3.00    Years: 20.00    Pack years: 60.00    Types: Cigarettes    Last attempt to quit: 11/01/1970    Years since quitting: 47.9  . Smokeless tobacco: Never Used  . Tobacco comment: quit 1967  Substance Use Topics  . Alcohol use: No  . Drug use: No    Family History  Problem Relation Age of Onset  . Heart disease Mother   . Rheum arthritis Mother   . Emphysema Father    No Known Allergies  OBJECTIVE: Blood pressure 122/62, pulse (!) 116, temperature (!) 103.4 F (39.7 C), temperature source Rectal, resp. rate (!) 28, height _0  (1.854 m), weight 85.3 kg, SpO2 91 %.  Physical Exam  Constitutional: He appears well-developed and well-nourished. No distress.  HENT:  Mouth/Throat: Oropharynx is clear and moist. No oropharyngeal exudate.  Eyes: Pupils are equal, round, and reactive to light. Conjunctivae and EOM are normal. No scleral icterus.  Neck: Normal range of motion. Neck supple.  Cardiovascular:  Regular rhythm, normal heart sounds and intact distal pulses.  No murmur heard. tachycardic  Pulmonary/Chest: Effort normal and breath sounds normal. He has no wheezes. He has no rales.  Abdominal: Soft. Bowel sounds are normal. There is no tenderness.  Musculoskeletal: Normal range of motion. He exhibits no edema.  Lymphadenopathy:    He has no cervical adenopathy.  Neurological: He is alert.  AAOx2 to name and location but not date  Skin: Skin is warm and dry. No rash noted. He is not diaphoretic.  Psychiatric: He has a normal mood and affect. His behavior is normal.    Lab Results Lab Results  Component Value Date   WBC 10.2 10/07/2018   HGB 11.8 (L) 10/07/2018  HCT 36.8 (L) 10/07/2018   MCV 94.4 10/07/2018   PLT 245 10/07/2018    Lab Results  Component Value Date   CREATININE 0.86 10/06/2018   BUN 11 10/06/2018   NA 137 10/06/2018   K 3.8 10/06/2018   CL 106 10/06/2018   CO2 23 10/06/2018    Lab Results  Component Value Date   ALT 21 10/04/2018   AST 18 10/04/2018   ALKPHOS 69 10/04/2018   BILITOT 1.5 (H) 10/04/2018     Microbiology: Recent Results (from the past 240 hour(s))  Blood Culture (routine x 2)     Status: None (Preliminary result)   Collection Time: 10/04/18  3:16 PM  Result Value Ref Range Status   Specimen Description BLOOD RIGHT WRIST  Final   Special Requests   Final    BOTTLES DRAWN AEROBIC AND ANAEROBIC Blood Culture adequate volume   Culture   Final    NO GROWTH 3 DAYS Performed at [facility], [place] [address], [place], [place] [address]    Report Status PENDING  Incomplete  Group A Strep by PCR     Status: None   Collection Time: 10/04/18  3:31 PM  Result Value Ref Range Status   Group A Strep by PCR NOT DETECTED NOT DETECTED Final    Comment: Performed at [facility], [place] [address], [place], [place] [address]  Blood Culture (routine x 2)     Status: None (Preliminary result)   Collection Time: 10/04/18  4:36 PM  Result  Value Ref Range Status   Specimen Description BLOOD LEFT HAND  Final   Special Requests   Final    BOTTLES DRAWN AEROBIC AND ANAEROBIC Blood Culture adequate volume   Culture   Final    NO GROWTH 3 DAYS Performed at [facility], [place] [address], [place], [place] [address]    Report Status PENDING  Incomplete  Urine culture     Status: None   Collection Time: 10/04/18  4:46 PM  Result Value Ref Range Status   Specimen Description URINE, CLEAN CATCH  Final   Special Requests NONE  Final   Culture   Final    NO GROWTH Performed at [facility], [place] [address], [place], [place] [address]    Report Status 10/05/2018 FINAL  Final    Mosetta Anis, MD, PGY1 10/07/2018, 2:25 PM

## 2018-10-07 NOTE — Clinical Social Work Note (Signed)
Patient not feeling well today. Daughter asked CSW come back later to discuss PT recommendations.  Anthony [name], [place]

## 2018-10-07 NOTE — Significant Event (Signed)
Rapid Response Event Note RN called for a second opinion regarding fever  Overview: Time Called: [place] Time: 1305 Event Type: Other (Comment)(fever)  Initial Focused Assessment: On arrival pt sitting upright in bed eating ice chips, daughter at bedside assisting with this. Alert and oriented x4. Skin very warm and dry, face flushed, BBS clear, dry cough. Denies any CP or SOB.  Interventions: Removed his blankets, left him with a sheet and one blanket to cover with, placed 4 ice packs to his groin and under bilateral axillary area.   Plan of Care (if not transferred): Continue to monitor, call RRT as needed, RN Sarah aware.  Event Summary: Name of Physician Notified: Dr. Marthenia Rolling at (PTA RRT )    at    Outcome: Stayed in room and stabalized     [place], [place]

## 2018-10-07 NOTE — Progress Notes (Signed)
Pt rectal temp= 103.4. Pt's daughter voices displeasure with current MD services. Pt's daughter requests new MD. RN notified pt placement. Pt placement states she will notify current MD and head of hospitalist. Charge nurse aware.

## 2018-10-07 NOTE — Clinical Social Work Note (Signed)
Went by room to discuss SNF recommendation. Patient off unit. Will try again later.  Dayton Scrape, [place]

## 2018-10-07 NOTE — Progress Notes (Signed)
Pt states he feels weaker and flushed. Pt temp= 100.8 (oral). Pt refuses rectal temp. RN notified MD. No new orders. RN called rapid response nurse. RR states she will come see pt.

## 2018-10-07 NOTE — Care Management Important Message (Signed)
Important Message  Patient Details  Name: Anthony [name] MRN: [id number] Date of Birth: 14-Feb-1934   Medicare Important Message Given:  Yes    Birda Didonato Montine Circle 10/07/2018, 3:16 PM

## 2018-10-07 NOTE — Progress Notes (Signed)
Patient awake, sitting up in bed at this time visiting with family and conversing.  Rectal temp 99.5. Pt took soup, frozen popsicle. And gingerale.   Family at bedside. Pt made comfortable.

## 2018-10-07 NOTE — Progress Notes (Signed)
PT Cancellation Note  Patient Details Name: Anthony [name] MRN: [id number] DOB: 1934/09/10   Cancelled Treatment:    Reason Eval/Treat Not Completed: Medical issues which prohibited therapy. Pt with fever and chills; requesting PT follow-up for treatment. Will do so as schedule permits.  Mabeline Caras, PT, DPT Acute Rehabilitation Services  Pager [phone] Office [place] 10/07/2018, 8:10 AM

## 2018-10-07 NOTE — Progress Notes (Signed)
Modified Barium Swallow Progress Note  Patient Details  Name: Anthony [name] MRN: [id number] Date of Birth: 06/03/1934  Today's Date: 10/07/2018  Modified Barium Swallow completed.  Full report located under Chart Review in the Imaging Section.  Brief recommendations include the following:  Clinical Impression  Mild oropharyngeal dysphagia during MBS in pt exhibiting increased increased lethargy, respiratory impairments and continuous and spontaneous cough during his entire time in the modified suite today (pre, during and post po). Dereased timing of swallow with bolus reaching pyriform sinuses inconsistently before swallow initiated. Increased volume and flow using straw, in addition to deficits, resulted in trace penetration of thin (penetration x 1 with cup sip). Prolonged mastication and transit with mechanical soft due to decreased endurance. No compensatory strategies were directly needed and SLP recommends continuing thin liquids using caution to consume only when adequately alert/engaged, upright position, ensuring small sips with caregiver removing cup/straw when/if sip excessive. Continue Dys 3 texture, pills whole in applesauce. Pt has had persistent fever, lethargy. Prognosis for swallow to return to baseline is good once recovers from this illness.    Swallow Evaluation Recommendations       SLP Diet Recommendations: Thin liquid;Dysphagia 3 (Mech soft) solids   Liquid Administration via: Cup   Medication Administration: Whole meds with puree   Supervision: Full supervision/cueing for compensatory strategies;Patient able to self feed;Full assist for feeding   Compensations: Slow rate;Small sips/bites   Postural Changes: Seated upright at 90 degrees;Remain semi-upright after after feeds/meals (Comment)   Oral Care Recommendations: Oral care BID        [name] 10/07/2018,3:06 PM  Orbie Pyo Colvin Caroli.Ed Risk analyst  [phone] Office [phone]

## 2018-10-07 NOTE — Progress Notes (Signed)
PROGRESS NOTE   Anthony [name]  OTL:[id number]    DOB: 12-09-1933    DOA: 10/04/2018  PCP: Josetta Huddle, MD   I have briefly reviewed patients previous medical records in [facility].  Brief Narrative:  84 year old male, lives with family, ambulates with the help of a cane, PMH of RA on chronic methotrexate, chronic systolic CHF, CAD, NSVT, GERD, HLD, HTN, OSA, dementia, Crohn's disease for which he was started on prednisone just a week ago for diarrhea and suspected Crohn's flare and inability to afford Entocort, reportedly sustained a mechanical fall a week prior to admission, presented with progressively weakness, decreased oral intake, dry cough, low-grade fevers up to 101 F, dizzy and lightheaded at times, worsening of chronic urinary incontinence and transient diarrhea that has resolved.  He was admitted for suspected sepsis of unclear source, dehydration, oral thrush and deconditioning.  10/06/2018: Patient continues to have fever and chills.  Seen alongside patient's daughter.  Awaiting palliative care input.  10/07/2018: Patient seen.  Patient's daughter updated extensively.  Patient continues to have fever.  CT chest findings noted.  Procalcitonin is not impressive.  Elevated CRP, but patient has history of rheumatoid arthritis.  Will consult infectious disease team (discussed personally with the infectious disease consultant).  Infectious disease team has kindly agreed to see patient in consultation.   Assessment & Plan:   Active Problems:   Sepsis ([place])   Suspected sepsis: Unclear source.  Febrile up to 101.18F rectally this morning.  Transiently hypotensive and tachypneic in ED.  Lactate normal.  Flu panel PCR negative.  UA not suggestive of UTI.  Group A strep by PCR negative.  Chest x-ray shows bibasilar atelectasis.  Blood cultures x2: Negative to date.  Since patient is immunocompromised on prednisone and MTX, empirically started on broad-spectrum antibiotics including  IV vancomycin, cefepime and Flagyl.  DC Flagyl. 10/06/2018: Patient continues to have fever.  Worsening leukocytosis.  WBC is 14,000 today.  We will proceed with procalcitonin level, ESR, CRP, ANA and CT scan of the chest without contrast.  Further work-up will depend on hospital course and goal of care.  10/07/2018: No sepsis physiology appreciated at this time.  Fever persists.  Procalcitonin is not impressive.  WBC is down to almost normal range.  However, CT chest was not entirely normal.  Will consult infectious disease team to assist with further assessment of fever of unclear etiology.  Oral thrush: In the context of chronic steroids.  Nystatin swish and swallow.  Dehydration with hyponatremia:  Secondary to poor oral intake.  IV normal saline hydration. 10/06/2018: Hyponatremia has resolved.  Rheumatoid arthritis:  Hold methotrexate.   Not on chronic prednisone as per family's report.   Follows with rheumatologist outpatient.   Patient follow-up with rheumatology  Dementia: Continue Aricept.  Essential hypertension: Soft blood pressures.  Reduce carvedilol to 3.125 mg twice daily and titrate up as blood pressures improved.  Hyperlipidemia: Continue statins.  Chronic systolic CHF: Clinically dehydrated.  Gently hydrate and follow closely.  CAD: Asymptomatic of chest pain.  GERD: Continue Pepcid.  OSA: Reportedly did not qualify for CPAP.  Mechanical fall: PT evaluated and recommend SNF.  PT also noted coughing and mild choking when drinking water and recommended speech therapy consult, placed.  Bilateral thigh rash:  This is most likely erythema ab igne (Chronic rash over bilateral thighs in a honeycomb pattern reportedly due to chronic use of hot water bag for raynauds disease)  History of Crohn's disease: As per family's  report, started on prednisone a week ago because he was unable to afford Entocort and has not been on prednisone chronically.  [facility] consulted and  rapidly tapering prednisone to DC over the next 2 days.  I discussed with Dr. Watt Climes.  Continue Lialda.   DVT prophylaxis: Lovenox Code Status: Full Family Communication: Discussed in detail with patient's daughter at bedside. Disposition: DC to SNF pending clinical improvement.   Consultants:  [facility] Infectious disease  Procedures:  None  Antimicrobials:  IV vancomycin and cefepime discontinued 10/07/2018. Discontinued Flagyl   Subjective: No history from the patient. Patient continues to have fever.  Objective:  Vitals:   10/07/18 1241 10/07/18 1251 10/07/18 1305 10/07/18 1516  BP:  122/62    Pulse:  (!) 116    Resp:  (!) 28    Temp: (!) 100.8 F (38.2 C) (!) 100.9 F (38.3 C) (!) 103.4 F (39.7 C) (!) 101.2 F (38.4 C)  TempSrc: Oral Oral Rectal Rectal  SpO2:  91%    Weight:      Height:        Examination:  General exam: Elderly male, moderately built and thinly nourished, frail and chronically ill looking lying comfortably propped up in bed.   ENT: Oral mucosa dry.  Thrush noted on tongue.  Mild patchy erythema of posterior pharyngeal wall without exudate or ulcers. Respiratory system: Clear to auscultation. Respiratory effort normal. Cardiovascular system: S1 & S2 heard, RRR. No JVD, murmurs, rubs, gallops or clicks. No pedal edema.  Telemetry personally reviewed: Sinus rhythm. Gastrointestinal system: Abdomen is nondistended, soft and nontender. No organomegaly or masses felt. Normal bowel sounds heard. Central nervous system: Alert and oriented to self and partly to place. No focal neurological deficits. Extremities: Symmetric 5 x 5 power. Skin: Chronic rash on bilateral thighs in a honeycomb pattern reportedly due to use of hot water bag for Raynaud's. Psychiatry: Judgement and insight appear impaired. Mood & affect flat.     Data Reviewed: I have personally reviewed following labs and imaging studies  CBC: Recent Labs  Lab 10/04/18 1516  10/04/18 1526 10/06/18 0306 10/07/18 0406  WBC 10.7*  --  14.0* 10.2  NEUTROABS 9.3*  --   --  8.2*  HGB 13.9 14.3 12.6* 11.8*  HCT 41.8 42.0 39.9 36.8*  MCV 95.0  --  95.5 94.4  PLT 254  --  232 953   Basic Metabolic Panel: Recent Labs  Lab 10/04/18 1516 10/04/18 1526 10/05/18 0427 10/06/18 0306  NA 131* 130* 134* 137  K 4.1 4.2 3.7 3.8  CL 99 98 106 106  CO2 24  --  23 23  GLUCOSE 175* 175* 127* 105*  BUN 14 16 12 11   CREATININE 1.04 1.00 0.94 0.86  CALCIUM 8.3*  --  7.9* 8.2*   Liver Function Tests: Recent Labs  Lab 10/04/18 1516  AST 18  ALT 21  ALKPHOS 69  BILITOT 1.5*  PROT 5.7*  ALBUMIN 2.8*   CBG: Recent Labs  Lab 10/05/18 0911  GLUCAP 82    Recent Results (from the past 240 hour(s))  Blood Culture (routine x 2)     Status: None (Preliminary result)   Collection Time: 10/04/18  3:16 PM  Result Value Ref Range Status   Specimen Description BLOOD RIGHT WRIST  Final   Special Requests   Final    BOTTLES DRAWN AEROBIC AND ANAEROBIC Blood Culture adequate volume   Culture   Final    NO GROWTH 3 DAYS  Performed at [facility], [place] [address], [place], [place] [address]    Report Status PENDING  Incomplete  Group A Strep by PCR     Status: None   Collection Time: 10/04/18  3:31 PM  Result Value Ref Range Status   Group A Strep by PCR NOT DETECTED NOT DETECTED Final    Comment: Performed at [facility], [place] [address], [place], [place] [address]  Blood Culture (routine x 2)     Status: None (Preliminary result)   Collection Time: 10/04/18  4:36 PM  Result Value Ref Range Status   Specimen Description BLOOD LEFT HAND  Final   Special Requests   Final    BOTTLES DRAWN AEROBIC AND ANAEROBIC Blood Culture adequate volume   Culture   Final    NO GROWTH 3 DAYS Performed at [facility], [place] [address], [place], [place] [address]    Report Status PENDING  Incomplete  Urine culture     Status: None   Collection Time: 10/04/18  4:46  PM  Result Value Ref Range Status   Specimen Description URINE, CLEAN CATCH  Final   Special Requests NONE  Final   Culture   Final    NO GROWTH Performed at [facility], [place] [address] [place], [place] [address]    Report Status 10/05/2018 FINAL  Final         Radiology Studies: Ct Chest Wo Contrast  Result Date: 10/07/2018 CLINICAL DATA:  Fever of unknown origin. Past medical history of rheumatoid arthritis on chronic methotrexate, chronic systolic CHF, coronary artery disease, GERD, hypertension, dementia, Crohn's disease status post recent prednisone. EXAM: CT CHEST WITHOUT CONTRAST TECHNIQUE: Multidetector CT imaging of the chest was performed following the standard protocol without IV contrast. COMPARISON:  Chest CT dated 07/06/2014. FINDINGS: Cardiovascular: Heart size is within normal limits. No pericardial effusion. Three-vessel coronary artery calcifications. No thoracic aortic aneurysm. Mild aortic atherosclerosis. Mediastinum/Nodes: Scattered small lymph nodes within the mediastinum, similar to previous chest CT from 2015 indicating benignity. No new enlarged lymph nodes appreciated. Esophagus appears normal. Trachea and central bronchi are unremarkable. Prominence of the LEFT thyroid lobe is stable. Lungs/Pleura: New patchy ground-glass opacities bilaterally, perihilar and upper lobe predominant bilaterally, LEFT slightly greater than RIGHT. No pleural effusion or pneumothorax seen. Upper Abdomen: Limited images of the upper abdomen are unremarkable. Musculoskeletal: New mild compression fracture deformity involving the superior endplate of the T5 vertebral body, of uncertain age but most likely chronic. IMPRESSION: 1. New patchy ground-glass opacities bilaterally, perihilar and upper lobe predominant, LEFT slightly greater than RIGHT. Differential includes atypical pneumonias such as viral or fungal, interstitial pneumonias, edema related to volume overload/CHF, chronic  interstitial diseases, and hypersensitivity pneumonitis. Favor atypical pneumonia and/or edema, alternatively methotrexate induced lung disease. 2. New mild compression fracture deformity of the T5 vertebral body, of uncertain age but most likely chronic based on appearance. 3. Coronary artery calcifications. Aortic Atherosclerosis (ICD10-I70.0). Electronically Signed   By: Franki Cabot M.D.   On: 10/07/2018 10:28   Dg Swallowing Func-speech Pathology  Result Date: 10/07/2018 Objective Swallowing Evaluation: Type of Study: MBS-Modified Barium Swallow Study  Patient Details Name: JUVENTINO PAVONE MRN: [id number] Date of Birth: February 10, 1934 Today's Date: 10/07/2018 Time: SLP Start Time (ACUTE ONLY): 1026 -SLP Stop Time (ACUTE ONLY): 1048 SLP Time Calculation (min) (ACUTE ONLY): 22 min Past Medical History: Past Medical History: Diagnosis Date . Arthritis   rheumatoid-  Dr Barkley Boards . CHF (congestive  heart failure) (HCC)   chronic systolic heart failure per Dr [name] LOV note 09/23/11.   EKG 4/12, stress test  and cath from 8/12 on chart.  Clearance with LOV Dr [name] on chart . Coronary artery disease  . Crohn disease ([place])  . Dysrhythmia   nonsustained,asymptomatic VT per Dr [name] office note  resulting in cath . GERD (gastroesophageal reflux disease)  . Heart attack ([place]) 1996, 06/2011 . Hyperlipidemia  . Hypertension  . Pneumonia 1993 . Prostate troubles   states take alternating meds for bladder/prostate control- "age thing" . Sleep apnea   sleep study years ago- has apnea but did not qualify for CPAP Past Surgical History: Past Surgical History: Procedure Laterality Date . CARDIAC CATHETERIZATION    1996/ 2012 report on chart . COLONOSCOPY   . INGUINAL HERNIA REPAIR  11/04/2011  Procedure: HERNIA REPAIR INGUINAL ADULT;  Surgeon: Pedro Earls, MD;  Location: [facility];  Service: General;  Laterality: Right;  Right Inguinal Hernia Repair with Mesh . KNEE ARTHROSCOPY Right 1976 . MENISCUS DEBRIDEMENT Left 1996 .  TRIGGER FINGER RELEASE Right 10/10/2009 . UMBILICAL HERNIA REPAIR  2009 . WRIST SURGERY Left 04/25/91 HPI: CALVIN JABLONOWSKI is a 84 y.o. male who presented to [facility] ED (from home) 10/04/18 with cough and generalized worsening weakness over past 2 days, has been unable to eat or drink. PMH significant for dementia, pneumonia, GERD, CHF, CAD, HTN, hyperlipdemia, heart attack (1996, 2012), sleep apnea. CXR showed bibasilar atelectasis.  No data recorded Assessment / Plan / Recommendation CHL IP CLINICAL IMPRESSIONS 10/07/2018 Clinical Impression Mild oropharyngeal dysphagia during MBS in pt exhibiting increased increased lethargy, respiratory impairments and continuous and spontaneous cough during his entire time in the modified suite today (pre, during and post po). Dereased timing of swallow with bolus reaching pyriform sinuses inconsistently before swallow initiated. Increased volume and flow using straw, in addition to deficits, resulted in trace penetration of thin (penetration x 1 with cup sip). Prolonged mastication and transit with mechanical soft due to decreased endurance. No compensatory strategies were directly needed and SLP recommends continuing thin liquids using caution to consume only when adequately alert/engaged, upright position, ensuring small sips with caregiver removing cup/straw when/if sip excessive. Continue Dys 3 texture, pills whole in applesauce. Pt has had persistent fever, lethargy. Prognosis for swallow to return to baseline is good once recovers from this illness.  SLP Visit Diagnosis Dysphagia, oropharyngeal phase (R13.12) Attention and concentration deficit following -- Frontal lobe and executive function deficit following -- Impact on safety and function Mild aspiration risk;Moderate aspiration risk   CHL IP TREATMENT RECOMMENDATION 10/07/2018 Treatment Recommendations Therapy as outlined in treatment plan below   Prognosis 10/07/2018 Prognosis for Safe Diet Advancement (No Data)  Barriers to Reach Goals Cognitive deficits Barriers/Prognosis Comment -- CHL IP DIET RECOMMENDATION 10/07/2018 SLP Diet Recommendations Thin liquid;Dysphagia 3 (Mech soft) solids Liquid Administration via Cup Medication Administration Whole meds with puree Compensations Slow rate;Small sips/bites Postural Changes Seated upright at 90 degrees;Remain semi-upright after after feeds/meals (Comment)   CHL IP OTHER RECOMMENDATIONS 10/07/2018 Recommended Consults -- Oral Care Recommendations Oral care BID Other Recommendations --   CHL IP FOLLOW UP RECOMMENDATIONS 10/07/2018 Follow up Recommendations Skilled Nursing facility   [facility] IP FREQUENCY AND DURATION 10/07/2018 Speech Therapy Frequency (ACUTE ONLY) min 2x/week Treatment Duration 2 weeks      CHL IP ORAL PHASE 10/07/2018 Oral Phase Impaired Oral - Pudding Teaspoon -- Oral - Pudding Cup -- Oral - Honey Teaspoon -- Oral - Honey  Cup -- Oral - Nectar Teaspoon -- Oral - Nectar Cup Reduced posterior propulsion Oral - Nectar Straw -- Oral - Thin Teaspoon -- Oral - Thin Cup Lingual pumping;Reduced posterior propulsion Oral - Thin Straw -- Oral - Puree Lingual/palatal residue Oral - Mech Soft -- Oral - Regular (No Data) Oral - Multi-Consistency -- Oral - Pill -- Oral Phase - Comment --  CHL IP PHARYNGEAL PHASE 10/07/2018 Pharyngeal Phase Impaired Pharyngeal- Pudding Teaspoon -- Pharyngeal -- Pharyngeal- Pudding Cup -- Pharyngeal -- Pharyngeal- Honey Teaspoon -- Pharyngeal -- Pharyngeal- Honey Cup -- Pharyngeal -- Pharyngeal- Nectar Teaspoon -- Pharyngeal -- Pharyngeal- Nectar Cup Delayed swallow initiation-vallecula Pharyngeal -- Pharyngeal- Nectar Straw WFL Pharyngeal -- Pharyngeal- Thin Teaspoon -- Pharyngeal -- Pharyngeal- Thin Cup Delayed swallow initiation-vallecula;Delayed swallow initiation-pyriform sinuses Pharyngeal -- Pharyngeal- Thin Straw Penetration/Aspiration during swallow Pharyngeal Material enters airway, remains ABOVE vocal cords and not ejected out  Pharyngeal- Puree WFL Pharyngeal -- Pharyngeal- Mechanical Soft WFL Pharyngeal -- Pharyngeal- Regular -- Pharyngeal -- Pharyngeal- Multi-consistency -- Pharyngeal -- Pharyngeal- Pill -- Pharyngeal -- Pharyngeal Comment --  CHL IP CERVICAL ESOPHAGEAL PHASE 10/07/2018 Cervical Esophageal Phase WFL Pudding Teaspoon -- Pudding Cup -- Honey Teaspoon -- Honey Cup -- Nectar Teaspoon -- Nectar Cup -- Nectar Straw -- Thin Teaspoon -- Thin Cup -- Thin Straw -- Puree -- Mechanical Soft -- Regular -- Multi-consistency -- Pill -- Cervical Esophageal Comment -- [name] 10/07/2018, 3:05 PM  Orbie Pyo Litaker M.Ed Risk analyst [phone] Office [phone]                  Scheduled Meds: . carvedilol  3.125 mg Oral BID WC  . chlorhexidine  15 mL Mouth Rinse BID  . donepezil  5 mg Oral QHS  . enoxaparin (LOVENOX) injection  40 mg Subcutaneous Q24H  . famotidine  40 mg Oral QHS  . feeding supplement (ENSURE ENLIVE)  237 mL Oral BID BM  . FLUoxetine  20 mg Oral Daily  . folic acid  1 mg Oral QPM  . loperamide  2 mg Oral QPM  . magnesium oxide  200 mg Oral Daily  . mouth rinse  15 mL Mouth Rinse q12n4p  . mesalamine  4.8 g Oral Q breakfast  . nystatin  5 mL Mouth/Throat QID  . oxybutynin  5 mg Oral BID  . pravastatin  40 mg Oral QHS  . predniSONE  20 mg Oral Q breakfast  . tamsulosin  0.4 mg Oral QPC supper  . vitamin B-12  500 mcg Oral QPM   Continuous Infusions: . ceFEPime (MAXIPIME) IV 2 g (10/07/18 0943)  . vancomycin 1,250 mg (10/07/18 0556)     LOS: 3 days     Bonnell Public, MD. [facility] Pager [phone]  If 7PM-7AM, please contact night-coverage [email or web address] Password [facility] 10/07/2018, 3:37 PM

## 2018-10-07 NOTE — Progress Notes (Addendum)
Daily Progress Note   Patient Name: Anthony [name]       Date: 10/07/2018 DOB: [birth year]  Age: 84 y.o. MRN#: [id number] Attending Physician: Bonnell Public, MD Primary Care Physician: Josetta Huddle, MD Admit Date: 10/04/2018  Reason for Consultation/Follow-up: Psychosocial/spiritual support  Subjective: Patient is sitting up, alert, interactive and conversing in bed. He feels better. Daughters concerned about his fevers in the morning and that he is on 3rd shift with sleep. They are concerned that he had a swallow study this morning when he was sleepy. We discussed the test.   Recommendation for palliative to follow at SNF.   Length of Stay: 3  Current Medications: Scheduled Meds:  . carvedilol  3.125 mg Oral BID WC  . chlorhexidine  15 mL Mouth Rinse BID  . donepezil  5 mg Oral QHS  . enoxaparin (LOVENOX) injection  40 mg Subcutaneous Q24H  . famotidine  40 mg Oral QHS  . feeding supplement (ENSURE ENLIVE)  237 mL Oral BID BM  . FLUoxetine  20 mg Oral Daily  . folic acid  1 mg Oral QPM  . loperamide  2 mg Oral QPM  . magnesium oxide  200 mg Oral Daily  . mouth rinse  15 mL Mouth Rinse q12n4p  . mesalamine  4.8 g Oral Q breakfast  . nystatin  5 mL Mouth/Throat QID  . oxybutynin  5 mg Oral BID  . pravastatin  40 mg Oral QHS  . predniSONE  20 mg Oral Q breakfast  . tamsulosin  0.4 mg Oral QPC supper  . vitamin B-12  500 mcg Oral QPM    Continuous Infusions:   PRN Meds: acetaminophen, guaiFENesin-dextromethorphan, nitroGLYCERIN  Physical Exam  Constitutional: No distress.  Pulmonary/Chest: Effort normal.  Neurological: He is alert.  Skin: Skin is warm and dry.            Vital Signs: BP (!) 100/57 (BP Location: Left Arm)   Pulse 90   Temp 99.5 F (37.5 C)  (Rectal)   Resp 20   Ht 6' 1"  (1.854 m)   Wt 85.3 kg   SpO2 94%   BMI 24.82 kg/m  SpO2: SpO2: 94 % O2 Device: O2 Device: Room Air O2 Flow Rate:    Intake/output summary:   Intake/Output Summary (Last 24 hours) at 10/07/2018  [place] filed at 10/07/2018 0954 Gross per 24 hour  Intake 1402.17 ml  Output 3700 ml  Net -2297.83 ml   LBM: Last BM Date: 10/05/18 Baseline Weight: Weight: 81.6 kg Most recent weight: Weight: 85.3 kg       Palliative Assessment/Data:      Patient Active Problem List   Diagnosis Date Noted  . Sepsis ([place]) 10/04/2018  . Fatty liver 04/06/2018  . Acute pancreatitis 04/04/2018  . Abdominal pain, right lower quadrant 05/28/2015  . Absolute anemia 05/28/2015  . Aortic atherosclerosis ([place]) 05/28/2015  . Altered blood in stool 05/28/2015  . Chronic systolic heart failure ([place]) 05/28/2015  . Syncope and collapse 05/28/2015  . Arteriosclerosis of coronary artery 05/28/2015  . Cough 05/28/2015  . Crohn's disease of large bowel ([place]) 05/28/2015  . D (diarrhea) 05/28/2015  . Displacement of cervical intervertebral disc without myelopathy 05/28/2015  . Benign essential HTN 05/28/2015  . Adenopathy 05/28/2015  . Hypercholesterolemia without hypertriglyceridemia 05/28/2015  . Hypo-osmolality and hyponatremia 05/28/2015  . Postinflammatory pulmonary fibrosis ([place]) 05/28/2015  . Healed myocardial infarct 05/28/2015  . Peptic esophagitis 05/28/2015  . 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 Paroxysmal ventricular tachycardia ([place]) 05/28/2015  . Decreased body weight 05/28/2015  . Weakness 11/05/2014  . Nausea with vomiting 11/05/2014  . Generalized weakness   . Dehydration   . Difficulty walking   . Pulmonary infiltrates 07/16/2014  . Chronic combined systolic and diastolic heart failure ([place]) 05/31/2014  . Essential hypertension 05/31/2014  .  H/O Right inguinal hernia repair 01/15/2012  . Small fat containing right inguinal hernia that has been painful  08/05/2011  . Finger wound, simple, open 10/12/2008  . Benign prostatic hyperplasia without urinary obstruction 04/30/2008  . Synovitis and tenosynovitis 04/30/2008  . HLD (hyperlipidemia) 04/12/2008  . COLONIC POLYPS 11/21/2007  . MI 11/21/2007  . Coronary atherosclerosis 11/21/2007  . GERD 11/21/2007  . Rheumatoid arthritis ([place]) 11/21/2007  . SLEEP APNEA, MILD 11/21/2007    Palliative Care Assessment & Plan     Recommendations/Plan:  Recommend palliative to follow at SNF.  Recommend sleep aid such as Melatonin.   Code Status:    Code Status Orders  (From admission, onward)         Start     Ordered   10/06/18 1447  Do not attempt resuscitation (DNR)  Continuous    Question Answer Comment  In the event of cardiac or respiratory ARREST Do not call a "code blue"   In the event of cardiac or respiratory ARREST Do not perform Intubation, CPR, defibrillation or ACLS   In the event of cardiac or respiratory ARREST Use medication by any route, position, wound care, and other measures to relive pain and suffering. May use oxygen, suction and manual treatment of airway obstruction as needed for comfort.   Comments MOST form in chart      10/06/18 1446        Code Status History    Date Active Date Inactive Code Status Order ID Comments User Context   10/04/2018 2003 10/06/2018 1446 Full Code [id number]  Merton Border, MD ED   04/04/2018 2331 04/07/2018 1803 Full Code [id number]  Bennie Pierini, MD ED   11/05/2014 0541 11/06/2014 1550 Full Code [id number]  Rise Patience, MD Inpatient       Prognosis:   Unable to determine  Discharge Planning:  To Be Determined    Thank you for allowing the Palliative Medicine Team to assist in the care of this  patient.   Total Time 35 min Prolonged Time Billed  no      Greater than 50%  of this time was spent counseling and coordinating care related to the above assessment and plan.  Asencion Gowda, NP  Please  contact Palliative Medicine Team phone at [phone] for questions and concerns.

## 2018-10-08 MED ORDER — ALPRAZOLAM 0.25 MG PO TABS
0.2500 mg | ORAL_TABLET | Freq: Every evening | ORAL | Status: DC | PRN
  Administered 2018-10-08 – 2018-10-12 (×4): 0.25 mg via ORAL
  Filled 2018-10-08 (×4): qty 1

## 2018-10-08 MED ORDER — PREDNISONE 10 MG PO TABS
10.0000 mg | ORAL_TABLET | Freq: Every day | ORAL | Status: DC
  Administered 2018-10-09 – 2018-10-10 (×2): 10 mg via ORAL
  Filled 2018-10-08 (×2): qty 1

## 2018-10-08 NOTE — Progress Notes (Signed)
PROGRESS NOTE   Anthony [name]  RCV:[id number]    DOB: 1934/10/08    DOA: 10/04/2018  PCP: Josetta Huddle, MD   I have briefly reviewed patients previous medical records in [facility].  Brief Narrative:  84 year old male, lives with family, ambulates with the help of a cane, PMH of RA on chronic methotrexate, chronic systolic CHF, CAD, NSVT, GERD, HLD, HTN, OSA, dementia, Crohn's disease for which he was started on prednisone just a week ago for diarrhea and suspected Crohn's flare and inability to afford Entocort, reportedly sustained a mechanical fall a week prior to admission, presented with progressively weakness, decreased oral intake, dry cough, low-grade fevers up to 101 F, dizzy and lightheaded at times, worsening of chronic urinary incontinence and transient diarrhea that has resolved.  He was admitted for suspected sepsis of unclear source, dehydration, oral thrush and deconditioning.  10/06/2018: Patient continues to have fever and chills.  Seen alongside patient's daughter.  Awaiting palliative care input.  10/07/2018: Patient seen.  Patient's daughter updated extensively.  Patient continues to have fever.  CT chest findings noted.  Procalcitonin is not impressive.  Elevated CRP, but patient has history of rheumatoid arthritis.  Will consult infectious disease team (discussed personally with the infectious disease consultant).  Infectious disease team has kindly agreed to see patient in consultation.  10/08/2018: Infectious disease input is appreciated.  Patient remains off antibiotics.  Fever persists.  Overall, patient looks better today, and more interactive.   Assessment & Plan:   Active Problems:   Sepsis ([place])   Suspected sepsis: Unclear source.  Febrile up to 101.28F rectally this morning.  Transiently hypotensive and tachypneic in ED.  Lactate normal.  Flu panel PCR negative.  UA not suggestive of UTI.  Group A strep by PCR negative.  Chest x-ray shows bibasilar  atelectasis.  Blood cultures x2: Negative to date.  Since patient is immunocompromised on prednisone and MTX, empirically started on broad-spectrum antibiotics including IV vancomycin, cefepime and Flagyl.  DC Flagyl. 10/06/2018: Patient continues to have fever.  Worsening leukocytosis.  WBC is 14,000 today.  We will proceed with procalcitonin level, ESR, CRP, ANA and CT scan of the chest without contrast.  Further work-up will depend on hospital course and goal of care.  10/08/18:  Fever persists.  Patient remains off antibiotics.  Infectious disease input is appreciated.    Oral thrush: In the context of chronic steroids.  Nystatin swish and swallow.  Dehydration with hyponatremia:  Secondary to poor oral intake.  IV normal saline hydration. 10/06/2018: Hyponatremia has resolved.  Rheumatoid arthritis:  Hold methotrexate.   Not on chronic prednisone as per family's report.   Follows with rheumatologist outpatient.   Patient follow-up with rheumatology  Dementia: Continue Aricept.  Essential hypertension: Blood pressure remains stable.  Reduce carvedilol to 3.125 mg twice daily and titrate up as blood pressures improved.  Hyperlipidemia: Continue statins.  Chronic systolic CHF: Clinically dehydrated.  Gently hydrate and follow closely.  CAD: Asymptomatic of chest pain.  GERD: Continue Pepcid.  OSA: Reportedly did not qualify for CPAP.  Mechanical fall: PT evaluated and recommend SNF.  PT also noted coughing and mild choking when drinking water and recommended speech therapy consult, placed.  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   Continue to taper prednisone.     Continue Lialda.  DVT prophylaxis: Lovenox Code Status: Full Family Communication: Discussed in detail with patient's son-in-law at bedside. Disposition: DC to SNF pending clinical improvement.   Consultants:  [facility] Infectious disease  Procedures:  None  Antimicrobials:  IV vancomycin and cefepime discontinued 10/07/2018. Discontinued Flagyl   Subjective: No new complaints.   Fever persists.    Objective:  Vitals:   10/07/18 1956 10/08/18 0547 10/08/18 0548 10/08/18 1019  BP: (!) 111/52 110/63  130/67  Pulse: 79 84  98  Resp: 18 18    Temp: 98.1 F (36.7 C) 98.2 F (36.8 C)    TempSrc: Oral Oral    SpO2: 93% 90%  90%  Weight:   87.1 kg   Height:        Examination:  General exam: Elderly male, moderately built and thinly nourished, frail and chronically ill looking lying comfortably propped up in bed.   ENT: Oral mucosa dry.  Thrush noted on tongue.  Mild patchy erythema of posterior pharyngeal wall without exudate or ulcers. Respiratory system: Clear to auscultation. Respiratory effort normal. Cardiovascular system: S1 & S2 heard, RRR. No JVD, murmurs, rubs, gallops or clicks. No pedal edema.  Telemetry personally reviewed: Sinus rhythm. Gastrointestinal system: Abdomen is nondistended, soft and nontender. No organomegaly or masses felt. Normal bowel sounds heard. Central nervous system: Alert and oriented to self and partly to place. No focal neurological deficits. Extremities: Symmetric 5 x 5 power. Skin: Chronic rash on bilateral thighs in a honeycomb pattern reportedly due to use of hot water bag for Raynaud's. Psychiatry: Judgement and insight appear impaired. Mood & affect flat.     Data Reviewed: I have personally reviewed following labs and imaging studies  CBC: Recent Labs  Lab 10/04/18 1516 10/04/18 1526 10/06/18 0306 10/07/18 0406  WBC 10.7*  --  14.0* 10.2  NEUTROABS 9.3*  --   --  8.2*  HGB 13.9 14.3 12.6* 11.8*  HCT 41.8 42.0 39.9  36.8*  MCV 95.0  --  95.5 94.4  PLT 254  --  232 801   Basic Metabolic Panel: Recent Labs  Lab 10/04/18 1516 10/04/18 1526 10/05/18 0427 10/06/18 0306  NA 131* 130* 134* 137  K 4.1 4.2 3.7 3.8  CL 99 98 106 106  CO2 24  --  23 23  GLUCOSE 175* 175* 127* 105*  BUN 14 16 12 11   CREATININE 1.04 1.00 0.94 0.86  CALCIUM 8.3*  --  7.9* 8.2*   Liver Function Tests: Recent Labs  Lab 10/04/18 1516  AST 18  ALT 21  ALKPHOS 69  BILITOT 1.5*  PROT 5.7*  ALBUMIN 2.8*   CBG: Recent Labs  Lab 10/05/18 0911  GLUCAP 82    Recent Results (from the past 240 hour(s))  Blood Culture (routine x 2)     Status: None (Preliminary result)   Collection Time: 10/04/18  3:16 PM  Result Value Ref Range Status   Specimen Description BLOOD RIGHT WRIST  Final   Special Requests   Final    BOTTLES DRAWN AEROBIC AND ANAEROBIC Blood Culture adequate volume   Culture   Final    NO GROWTH 4 DAYS Performed at [facility], [address], [place], [place] [address]    Report Status PENDING  Incomplete  Group A Strep by PCR  Status: None   Collection Time: 10/04/18  3:31 PM  Result Value Ref Range Status   Group A Strep by PCR NOT DETECTED NOT DETECTED Final    Comment: Performed at [facility], [address] [place], [place] [address]  Blood Culture (routine x 2)     Status: None (Preliminary result)   Collection Time: 10/04/18  4:36 PM  Result Value Ref Range Status   Specimen Description BLOOD LEFT HAND  Final   Special Requests   Final    BOTTLES DRAWN AEROBIC AND ANAEROBIC Blood Culture adequate volume   Culture   Final    NO GROWTH 4 DAYS Performed at [facility], [place] [address] [place], [place] [address]    Report Status PENDING  Incomplete  Urine culture     Status: None   Collection Time: 10/04/18  4:46 PM  Result Value Ref Range Status   Specimen Description URINE, CLEAN CATCH  Final   Special Requests NONE  Final   Culture   Final    NO  GROWTH Performed at [facility], [place] [address] [place], [place] [address]    Report Status 10/05/2018 FINAL  Final  MRSA PCR Screening     Status: None   Collection Time: 10/07/18  3:07 PM  Result Value Ref Range Status   MRSA by PCR NEGATIVE NEGATIVE Final    Comment:        The GeneXpert MRSA Assay (FDA approved for NASAL specimens only), is one component of a comprehensive MRSA colonization surveillance program. It is not intended to diagnose MRSA infection nor to guide or monitor treatment for MRSA infections. Performed at [facility], [place] [address], [place], [place] [address]          Radiology Studies: Ct Chest Wo Contrast  Result Date: 10/07/2018 CLINICAL DATA:  Fever of unknown origin. Past medical history of rheumatoid arthritis on chronic methotrexate, chronic systolic CHF, coronary artery disease, GERD, hypertension, dementia, Crohn's disease status post recent prednisone. EXAM: CT CHEST WITHOUT CONTRAST TECHNIQUE: Multidetector CT imaging of the chest was performed following the standard protocol without IV contrast. COMPARISON:  Chest CT dated 07/06/2014. FINDINGS: Cardiovascular: Heart size is within normal limits. No pericardial effusion. Three-vessel coronary artery calcifications. No thoracic aortic aneurysm. Mild aortic atherosclerosis. Mediastinum/Nodes: Scattered small lymph nodes within the mediastinum, similar to previous chest CT from 2015 indicating benignity. No new enlarged lymph nodes appreciated. Esophagus appears normal. Trachea and central bronchi are unremarkable. Prominence of the LEFT thyroid lobe is stable. Lungs/Pleura: New patchy ground-glass opacities bilaterally, perihilar and upper lobe predominant bilaterally, LEFT slightly greater than RIGHT. No pleural effusion or pneumothorax seen. Upper Abdomen: Limited images of the upper abdomen are unremarkable. Musculoskeletal: New mild compression fracture deformity involving the superior  endplate of the T5 vertebral body, of uncertain age but most likely chronic. IMPRESSION: 1. New patchy ground-glass opacities bilaterally, perihilar and upper lobe predominant, LEFT slightly greater than RIGHT. Differential includes atypical pneumonias such as viral or fungal, interstitial pneumonias, edema related to volume overload/CHF, chronic interstitial diseases, and hypersensitivity pneumonitis. Favor atypical pneumonia and/or edema, alternatively methotrexate induced lung disease. 2. New mild compression fracture deformity of the T5 vertebral body, of uncertain age but most likely chronic based on appearance. 3. Coronary artery calcifications. Aortic Atherosclerosis (ICD10-I70.0). Electronically Signed   By: Franki Cabot M.D.   On: 10/07/2018 10:28   Dg Swallowing Func-speech Pathology  Result Date: 10/07/2018 Objective Swallowing Evaluation:  Type of Study: MBS-Modified Barium Swallow Study  Patient Details Name: JOEL COWIN MRN: [id number] Date of Birth: [birth year] Today's Date: 10/07/2018 Time: SLP Start Time (ACUTE ONLY): 1026 -SLP Stop Time (ACUTE ONLY): 1048 SLP Time Calculation (min) (ACUTE ONLY): 22 min Past Medical History: Past Medical History: Diagnosis Date . Arthritis   rheumatoid-  Dr Barkley Boards . CHF (congestive heart failure) (H[facility])   chronic systolic heart failure per Dr [name] LOV note 09/23/11.   EKG 4/12, stress test  and cath from 8/12 on chart.  Clearance with LOV Dr [name] on chart . Coronary artery disease  . Crohn disease ([place])  . Dysrhythmia   nonsustained,asymptomatic VT per Dr [name] office note  resulting in cath . GERD (gastroesophageal reflux disease)  . Heart attack ([place]) 1996, 06/2011 . Hyperlipidemia  . Hypertension  . Pneumonia 1993 . Prostate troubles   states take alternating meds for bladder/prostate control- "age thing" . Sleep apnea   sleep study years ago- has apnea but did not qualify for CPAP Past Surgical History: Past Surgical History: Procedure Laterality Date .  CARDIAC CATHETERIZATION    1996/ 2012 report on chart . COLONOSCOPY   . INGUINAL HERNIA REPAIR  11/04/2011  Procedure: HERNIA REPAIR INGUINAL ADULT;  Surgeon: Pedro Earls, MD;  Location: [facility];  Service: General;  Laterality: Right;  Right Inguinal Hernia Repair with Mesh . KNEE ARTHROSCOPY Right 1976 . MENISCUS DEBRIDEMENT Left 1996 . TRIGGER FINGER RELEASE Right 10/10/2009 . UMBILICAL HERNIA REPAIR  2009 . WRIST SURGERY Left 04/25/91 HPI: LIEM COPENHAVER is a 84 y.o. male who presented to [facility] ED (from home) 10/04/18 with cough and generalized worsening weakness over past 2 days, has been unable to eat or drink. PMH significant for dementia, pneumonia, GERD, CHF, CAD, HTN, hyperlipdemia, heart attack (1996, 2012), sleep apnea. CXR showed bibasilar atelectasis.  No data recorded Assessment / Plan / Recommendation CHL IP CLINICAL IMPRESSIONS 10/07/2018 Clinical Impression Mild oropharyngeal dysphagia during MBS in pt exhibiting increased increased lethargy, respiratory impairments and continuous and spontaneous cough during his entire time in the modified suite today (pre, during and post po). Dereased timing of swallow with bolus reaching pyriform sinuses inconsistently before swallow initiated. Increased volume and flow using straw, in addition to deficits, resulted in trace penetration of thin (penetration x 1 with cup sip). Prolonged mastication and transit with mechanical soft due to decreased endurance. No compensatory strategies were directly needed and SLP recommends continuing thin liquids using caution to consume only when adequately alert/engaged, upright position, ensuring small sips with caregiver removing cup/straw when/if sip excessive. Continue Dys 3 texture, pills whole in applesauce. Pt has had persistent fever, lethargy. Prognosis for swallow to return to baseline is good once recovers from this illness.  SLP Visit Diagnosis Dysphagia, oropharyngeal phase (R13.12) Attention and concentration  deficit following -- Frontal lobe and executive function deficit following -- Impact on safety and function Mild aspiration risk;Moderate aspiration risk   CHL IP TREATMENT RECOMMENDATION 10/07/2018 Treatment Recommendations Therapy as outlined in treatment plan below   Prognosis 10/07/2018 Prognosis for Safe Diet Advancement (No Data) Barriers to Reach Goals Cognitive deficits Barriers/Prognosis Comment -- CHL IP DIET RECOMMENDATION 10/07/2018 SLP Diet Recommendations Thin liquid;Dysphagia 3 (Mech soft) solids Liquid Administration via Cup Medication Administration Whole meds with puree Compensations Slow rate;Small sips/bites Postural Changes Seated upright at 90 degrees;Remain semi-upright after after feeds/meals (Comment)   CHL IP OTHER RECOMMENDATIONS 10/07/2018 Recommended Consults -- Oral Care Recommendations Oral care BID Other Recommendations --  CHL IP FOLLOW UP RECOMMENDATIONS 10/07/2018 Follow up Recommendations Skilled Nursing facility   [facility] IP FREQUENCY AND DURATION 10/07/2018 Speech Therapy Frequency (ACUTE ONLY) min 2x/week Treatment Duration 2 weeks      CHL IP ORAL PHASE 10/07/2018 Oral Phase Impaired Oral - Pudding Teaspoon -- Oral - Pudding Cup -- Oral - Honey Teaspoon -- Oral - Honey Cup -- Oral - Nectar Teaspoon -- Oral - Nectar Cup Reduced posterior propulsion Oral - Nectar Straw -- Oral - Thin Teaspoon -- Oral - Thin Cup Lingual pumping;Reduced posterior propulsion Oral - Thin Straw -- Oral - Puree Lingual/palatal residue Oral - Mech Soft -- Oral - Regular (No Data) Oral - Multi-Consistency -- Oral - Pill -- Oral Phase - Comment --  CHL IP PHARYNGEAL PHASE 10/07/2018 Pharyngeal Phase Impaired Pharyngeal- Pudding Teaspoon -- Pharyngeal -- Pharyngeal- Pudding Cup -- Pharyngeal -- Pharyngeal- Honey Teaspoon -- Pharyngeal -- Pharyngeal- Honey Cup -- Pharyngeal -- Pharyngeal- Nectar Teaspoon -- Pharyngeal -- Pharyngeal- Nectar Cup Delayed swallow initiation-vallecula Pharyngeal -- Pharyngeal-  Nectar Straw WFL Pharyngeal -- Pharyngeal- Thin Teaspoon -- Pharyngeal -- Pharyngeal- Thin Cup Delayed swallow initiation-vallecula;Delayed swallow initiation-pyriform sinuses Pharyngeal -- Pharyngeal- Thin Straw Penetration/Aspiration during swallow Pharyngeal Material enters airway, remains ABOVE vocal cords and not ejected out Pharyngeal- Puree WFL Pharyngeal -- Pharyngeal- Mechanical Soft WFL Pharyngeal -- Pharyngeal- Regular -- Pharyngeal -- Pharyngeal- Multi-consistency -- Pharyngeal -- Pharyngeal- Pill -- Pharyngeal -- Pharyngeal Comment --  CHL IP CERVICAL ESOPHAGEAL PHASE 10/07/2018 Cervical Esophageal Phase WFL Pudding Teaspoon -- Pudding Cup -- Honey Teaspoon -- Honey Cup -- Nectar Teaspoon -- Nectar Cup -- Nectar Straw -- Thin Teaspoon -- Thin Cup -- Thin Straw -- Puree -- Mechanical Soft -- Regular -- Multi-consistency -- Pill -- Cervical Esophageal Comment -- [name] 10/07/2018, 3:05 PM  Orbie Pyo Litaker M.Ed Risk analyst [phone] Office [phone]                  Scheduled Meds: . carvedilol  3.125 mg Oral BID WC  . chlorhexidine  15 mL Mouth Rinse BID  . donepezil  5 mg Oral QHS  . enoxaparin (LOVENOX) injection  40 mg Subcutaneous Q24H  . famotidine  40 mg Oral QHS  . feeding supplement (ENSURE ENLIVE)  237 mL Oral BID BM  . FLUoxetine  20 mg Oral Daily  . folic acid  1 mg Oral QPM  . loperamide  2 mg Oral QPM  . magnesium oxide  200 mg Oral Daily  . mouth rinse  15 mL Mouth Rinse q12n4p  . mesalamine  4.8 g Oral Q breakfast  . nystatin  5 mL Mouth/Throat QID  . oxybutynin  5 mg Oral BID  . pravastatin  40 mg Oral QHS  . predniSONE  20 mg Oral Q breakfast  . tamsulosin  0.4 mg Oral QPC supper  . vitamin B-12  500 mcg Oral QPM   Continuous Infusions:    LOS: 4 days     Bonnell Public, MD. [facility] Pager [phone]  If 7PM-7AM, please contact night-coverage [email or web address] Password [email or web address] 10/08/2018, 12:21  PM

## 2018-10-08 NOTE — Progress Notes (Signed)
[facility] Infect[facility]    Date of Admission:  10/04/2018   Total days of antibiotics 4          ID: Anthony [name] is a 84 y.o. male with dementia, found to have fever, suspect viral etiology Active Problems:   Sepsis ([place])    Subjective: Remains afebrile off of abtx, had swallow examn this morning.  Medications:  . carvedilol  3.125 mg Oral BID WC  . chlorhexidine  15 mL Mouth Rinse BID  . donepezil  5 mg Oral QHS  . enoxaparin (LOVENOX) injection  40 mg Subcutaneous Q24H  . famotidine  40 mg Oral QHS  . feeding supplement (ENSURE ENLIVE)  237 mL Oral BID BM  . FLUoxetine  20 mg Oral Daily  . folic acid  1 mg Oral QPM  . loperamide  2 mg Oral QPM  . magnesium oxide  200 mg Oral Daily  . mouth rinse  15 mL Mouth Rinse q12n4p  . mesalamine  4.8 g Oral Q breakfast  . nystatin  5 mL Mouth/Throat QID  . oxybutynin  5 mg Oral BID  . pravastatin  40 mg Oral QHS  . predniSONE  20 mg Oral Q breakfast  . tamsulosin  0.4 mg Oral QPC supper  . vitamin B-12  500 mcg Oral QPM    Objective: Vital signs in last 24 hours: Temp:  [97.7 F (36.5 C)-103.4 F (39.7 C)] 98.2 F (36.8 C) (11/16 0547) Pulse Rate:  [79-116] 98 (11/16 1019) Resp:  [18-28] 18 (11/16 0547) BP: (100-130)/(52-67) 130/67 (11/16 1019) SpO2:  [90 %-94 %] 90 % (11/16 1019) Weight:  [87.1 kg] 87.1 kg (11/16 0548)    Lab Results Recent Labs    10/06/18 0306 10/07/18 0406  WBC 14.0* 10.2  HGB 12.6* 11.8*  HCT 39.9 36.8*  NA 137  --   K 3.8  --   CL 106  --   CO2 23  --   BUN 11  --   CREATININE 0.86  --    Liver Panel No results for input(s): PROT, ALBUMIN, AST, ALT, ALKPHOS, BILITOT, BILIDIR, IBILI in the last 72 hours. Sedimentation Rate Recent Labs    10/07/18 0406  ESRSEDRATE 37*   C-Reactive Protein Recent Labs    10/07/18 0406  CRP 8.6*    Microbiology: Blood cx ngtd Studies/Results: Ct Chest Wo Contrast  Result Date: 10/07/2018 CLINICAL DATA:  Fever of  unknown origin. Past medical history of rheumatoid arthritis on chronic methotrexate, chronic systolic CHF, coronary artery disease, GERD, hypertension, dementia, Crohn's disease status post recent prednisone. EXAM: CT CHEST WITHOUT CONTRAST TECHNIQUE: Multidetector CT imaging of the chest was performed following the standard protocol without IV contrast. COMPARISON:  Chest CT dated 07/06/2014. FINDINGS: Cardiovascular: Heart size is within normal limits. No pericardial effusion. Three-vessel coronary artery calcifications. No thoracic aortic aneurysm. Mild aortic atherosclerosis. Mediastinum/Nodes: Scattered small lymph nodes within the mediastinum, similar to previous chest CT from 2015 indicating benignity. No new enlarged lymph nodes appreciated. Esophagus appears normal. Trachea and central bronchi are unremarkable. Prominence of the LEFT thyroid lobe is stable. Lungs/Pleura: New patchy ground-glass opacities bilaterally, perihilar and upper lobe predominant bilaterally, LEFT slightly greater than RIGHT. No pleural effusion or pneumothorax seen. Upper Abdomen: Limited images of the upper abdomen are unremarkable. Musculoskeletal: New mild compression fracture deformity involving the superior endplate of the T5 vertebral body, of uncertain age but most likely chronic. IMPRESSION: 1. New patchy ground-glass opacities bilaterally, perihilar  and upper lobe predominant, LEFT slightly greater than RIGHT. Differential includes atypical pneumonias such as viral or fungal, interstitial pneumonias, edema related to volume overload/CHF, chronic interstitial diseases, and hypersensitivity pneumonitis. Favor atypical pneumonia and/or edema, alternatively methotrexate induced lung disease. 2. New mild compression fracture deformity of the T5 vertebral body, of uncertain age but most likely chronic based on appearance. 3. Coronary artery calcifications. Aortic Atherosclerosis (ICD10-I70.0). Electronically Signed   By: Franki Cabot M.D.   On: 10/07/2018 10:28   Dg Swallowing Func-speech Pathology  Result Date: 10/07/2018 Objective Swallowing Evaluation: Type of Study: MBS-Modified Barium Swallow Study  Patient Details Name: Anthony [name] MRN: [id number] Date of Birth: Sep 10, 1934 Today's Date: 10/07/2018 Time: SLP Start Time (ACUTE ONLY): 1026 -SLP Stop Time (ACUTE ONLY): 1048 SLP Time Calculation (min) (ACUTE ONLY): 22 min Past Medical History: Past Medical History: Diagnosis Date . Arthritis   rheumatoid-  Dr Barkley Boards . CHF (congestive heart failure) (HCC)   chronic systolic heart failure per Dr [name] LOV note 09/23/11.   EKG 4/12, stress test  and cath from 8/12 on chart.  Clearance with LOV Dr [name] on chart . Coronary artery disease  . Crohn disease ([place])  . Dysrhythmia   nonsustained,asymptomatic VT per Dr [name] office note  resulting in cath . GERD (gastroesophageal reflux disease)  . Heart attack ([place]) 1996, 06/2011 . Hyperlipidemia  . Hypertension  . Pneumonia 1993 . Prostate troubles   states take alternating meds for bladder/prostate control- "age thing" . Sleep apnea   sleep study years ago- has apnea but did not qualify for CPAP Past Surgical History: Past Surgical History: Procedure Laterality Date . CARDIAC CATHETERIZATION    1996/ 2012 report on chart . COLONOSCOPY   . INGUINAL HERNIA REPAIR  11/04/2011  Procedure: HERNIA REPAIR INGUINAL ADULT;  Surgeon: Pedro Earls, MD;  Location: [facility];  Service: General;  Laterality: Right;  Right Inguinal Hernia Repair with Mesh . KNEE ARTHROSCOPY Right 1976 . MENISCUS DEBRIDEMENT Left 1996 . TRIGGER FINGER RELEASE Right 10/10/2009 . UMBILICAL HERNIA REPAIR  2009 . WRIST SURGERY Left 04/25/91 HPI: Anthony [name] is a 84 y.o. male who presented [facility] ED (from home) 10/04/18 with cough and generalized worsening weakness over past 2 days, has been unable to eat or drink. PMH significant for dementia, pneumonia, GERD, CHF, CAD, HTN, hyperlipdemia, heart attack (1996, 2012),  sleep apnea. CXR showed bibasilar atelectasis.  No data recorded Assessment / Plan / Recommendation CH[facility] IP CLINICAL IMPRESSIONS 10/07/2018 Clinical Impression Mild oropharyngeal dysphagia during MBS in pt exhibiting increased increased lethargy, respiratory impairments and continuous and spontaneous cough during his entire time in the modified suite today (pre, during and post po). Dereased timing of swallow with bolus reaching pyriform sinuses inconsistently before swallow initiated. Increased volume and flow using straw, in addition to deficits, resulted in trace penetration of thin (penetration x 1 with cup sip). Prolonged mastication and transit with mechanical soft due to decreased endurance. No compensatory strategies were directly needed and SLP recommends continuing thin liquids using caution to consume only when adequately alert/engaged, upright position, ensuring small sips with caregiver removing cup/straw when/if sip excessive. Continue Dys 3 texture, pills whole in applesauce. Pt has had persistent fever, lethargy. Prognosis for swallow to return to baseline is good once recovers from this illness.  SLP Visit Diagnosis Dysphagia, oropharyngeal phase (R13.12) Attention and concentration deficit following -- Frontal lobe and executive function deficit following -- Impact on safety and function Mild aspiration risk;Moderate aspiration risk  [facility] IP TREATMENT RECOMMENDATION 10/07/2018 Treatment Recommendations Therapy as outlined in treatment plan below   Prognosis 10/07/2018 Prognosis for Safe Diet Advancement (No Data) Barriers to Reach Goals Cognitive deficits Barriers/Prognosis Comment -- [facility] IP DIET RECOMMENDATION 10/07/2018 SLP Diet Recommendations Thin liquid;Dysphagia 3 (Mech soft) solids Liquid Administration via Cup Medication Administration Whole meds with puree Compensations Slow rate;Small sips/bites Postural Changes Seated upright at 90 degrees;Remain semi-upright after after feeds/meals  (Comment)   [facility] IP OTHER RECOMMENDATIONS 10/07/2018 Recommended Consults -- Oral Care Recommendations Oral care BID Other Recommendations --   [facility] IP FOLLOW UP RECOMMENDATIONS 10/07/2018 Follow up Recommendations Skilled Nursing facility   [facility] IP FREQUENCY AND DURATION 10/07/2018 Speech Therapy Frequency (ACUTE ONLY) min 2x/week Treatment Duration 2 weeks      CH[facility] IP ORAL PHASE 10/07/2018 Oral Phase Impaired Oral - Pudding Teaspoon -- Oral - Pudding Cup -- Oral - Honey Teaspoon -- Oral - Honey Cup -- Oral - Nectar Teaspoon -- Oral - Nectar Cup Reduced posterior propulsion Oral - Nectar Straw -- Oral - Thin Teaspoon -- Oral - Thin Cup Lingual pumping;Reduced posterior propulsion Oral - Thin Straw -- Oral - Puree Lingual/palatal residue Oral - Mech Soft -- Oral - Regular (No Data) Oral - Multi-Consistency -- Oral - Pill -- Oral Phase - Comment --  [facility] IP PHARYNGEAL PHASE 10/07/2018 Pharyngeal Phase Impaired Pharyngeal- Pudding Teaspoon -- Pharyngeal -- Pharyngeal- Pudding Cup -- Pharyngeal -- Pharyngeal- Honey Teaspoon -- Pharyngeal -- Pharyngeal- Honey Cup -- Pharyngeal -- Pharyngeal- Nectar Teaspoon -- Pharyngeal -- Pharyngeal- Nectar Cup Delayed swallow initiation-vallecula Pharyngeal -- Pharyngeal- Nectar Straw WFL Pharyngeal -- Pharyngeal- Thin Teaspoon -- Pharyngeal -- Pharyngeal- Thin Cup Delayed swallow initiation-vallecula;Delayed swallow initiation-pyriform sinuses Pharyngeal -- Pharyngeal- Thin Straw Penetration/Aspiration during swallow Pharyngeal Material enters airway, remains ABOVE vocal cords and not ejected out Pharyngeal- Puree WFL Pharyngeal -- Pharyngeal- Mechanical Soft WFL Pharyngeal -- Pharyngeal- Regular -- Pharyngeal -- Pharyngeal- Multi-consistency -- Pharyngeal -- Pharyngeal- Pill -- Pharyngeal -- Pharyngeal Comment --  [facility] IP CERVICAL ESOPHAGEAL PHASE 10/07/2018 Cervical Esophageal Phase WFL Pudding Teaspoon -- Pudding Cup -- Honey Teaspoon -- Honey Cup -- Nectar Teaspoon -- Nectar Cup --  Nectar Straw -- Thin Teaspoon -- Thin Cup -- Thin Straw -- Puree -- Mechanical Soft -- Regular -- Multi-consistency -- Pill -- Cervical Esophageal Comment -- [name] 10/07/2018, 3:05 PM  Orbie Pyo Litaker M.Ed Actor Pager [phone] Office [phone]               Assessment/Plan: Continue to monitor off of abtx No recent culture data to suggest need for antimicrobials at this time.  [facility] for Infectious Diseases Cell: [phone] Pager: [phone]  10/08/2018, 11:47 AM

## 2018-10-08 NOTE — Progress Notes (Signed)
Patient's bed set to 30 degrees or above due to aspiration precautions and suction is set up in room

## 2018-10-09 LAB — CULTURE, BLOOD (ROUTINE X 2)
CULTURE: NO GROWTH
CULTURE: NO GROWTH
SPECIAL REQUESTS: ADEQUATE
SPECIAL REQUESTS: ADEQUATE

## 2018-10-09 LAB — EXPECTORATED SPUTUM ASSESSMENT W REFEX TO RESP CULTURE

## 2018-10-09 LAB — EXPECTORATED SPUTUM ASSESSMENT W GRAM STAIN, RFLX TO RESP C

## 2018-10-09 NOTE — Plan of Care (Signed)
  Problem: Education: Goal: Knowledge of General Education information will improve Description Including pain rating scale, medication(s)/side effects and non-pharmacologic comfort measures Outcome: Progressing   Problem: Clinical Measurements: Goal: Will remain free from infection Outcome: Progressing   Problem: Coping: Goal: Level of anxiety will decrease Outcome: Progressing   Problem: Safety: Goal: Ability to remain free from injury will improve Outcome: Progressing   Problem: Activity: Goal: Capacity to carry out activities will improve Outcome: Progressing   Problem: Cardiac: Goal: Ability to achieve and maintain adequate cardiopulmonary perfusion will improve Outcome: Progressing

## 2018-10-09 NOTE — Plan of Care (Signed)
  Problem: Clinical Measurements: Goal: Cardiovascular complication will be avoided Outcome: Progressing   Problem: Clinical Measurements: Goal: Respiratory complications will improve Outcome: Progressing

## 2018-10-09 NOTE — Progress Notes (Signed)
PROGRESS NOTE   Anthony [name]  AGT:[id number]    DOB: 13-Sep-1934    DOA: 10/04/2018  PCP: Josetta Huddle, MD    Brief Narrative:  Patient is an 84 year old male, with past medical history significant for RA on chronic methotrexate, chronic systolic CHF, CAD, NSVT, GERD, HLD, HTN, OSA, dementia, Crohn's disease for which the patient was started on prednisone just a week prior to presentation for suspected Crohn's flare and inability to afford Entocort.  Patient was admitted with fever, progressive weakness with possible recent fall, decreased oral intake, dry cough, intermittent lightheadedness, worsening of chronic urinary incontinence and transient diarrhea that had resolved.  Patient was initially started on vancomycin and Zosyn for suspected sepsis of unclear source.  Despite broad-spectrum antibiotics, and continues to have intermittent fever.  Further work-up was pursued, including ESR, CRP, ANA, procalcitonin and CT scan of the chest.  CRP was 8.6, procalcitonin was 0.19.  CT scan of the chest without contrast revealed "New patchy ground-glass opacities bilaterally, perihilar and upper lobe predominant, LEFT slightly greater than RIGHT.  New mild compression fracture deformity of the T5 vertebral body, of uncertain age but most likely chronic based on appearance.  Coronary artery calcifications and aortic atherosclerosis".  Due to persistent fever, infectious disease was consulted.  Infectious disease team has discontinued antibiotics.  Patient is currently being monitored off antibiotics.  Current thinking, as per infectious disease team, is that of likely viral illness.  Patient has continued to have fever.  Patient's daughter reported yellowish phlegm earlier today.  We will send phlegm for cultures and sensitivity when available.  Further management will depend on hospital course.  Assessment & Plan:   Active Problems:   Sepsis ([place])   Suspected sepsis/SIRS versus fever of unknown origin:   -Infectious disease input is highly appreciated. -Patient is currently off IV vancomycin and Zosyn. -Patient continues to have fever. -Recent yellowish phlegm reported by patient's daughter. -Continue to monitor white blood count. -Further management as directed by infectious disease team.  Flu panel PCR negative.   UA not suggestive of UTI.   Group A strep by PCR negative.   Chest x-ray shows bibasilar atelectasis.   CT chest without contrast result is as documented above.   Blood cultures x2 Procalcitonin level is 0.19.  CRP is greater than 8.   Oral thrush: In the context of chronic steroids.  Nystatin swish and swallow.  Hyponatremia:  Possibly related to volume depletion. Resolved with adequate hydration. Continue to monitor closely.  Rheumatoid arthritis:  Methotrexate is on hold.     Not on chronic prednisone as per family's report.   Follows with rheumatologist outpatient.   Patient follow-up with rheumatology  Dementia: Continue Aricept.  Essential hypertension:  Blood pressure remains stable but on low normal side. Avoid excessive drop in blood pressure as there has been history of falls. Continue to optimize blood pressure control.  Hyperlipidemia: Continue statins.  Chronic systolic CHF:  Compensated.   CAD: Asymptomatic of chest pain.  GERD: Continue Pepcid.  OSA: Reportedly did not qualify for CPAP.  Mechanical fall:  PT evaluated and recommend SNF.   PT also noted coughing and mild choking when drinking water and recommended speech therapy consult, placed.  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   Continue to taper prednisone.     Continue Lialda.  DVT prophylaxis: Lovenox Code Status: Full Family Communication: Discussed in detail with patient's son-in-law at bedside. Disposition: DC to SNF pending clinical improvement.   Consultants:  [facility]  Procedures:  None  Antimicrobials:  IV vancomycin and cefepime discontinued 10/07/2018. Discontinued Flagyl   Subjective: Daughter reports yellowish phlegm. Fever persists.    Objective:  Vitals:   10/09/18 0620 10/09/18 0857 10/09/18 1100 10/09/18 1357  BP:  110/61  115/63  Pulse:    (!) 103  Resp:    18  Temp:  (!) 102.8 F (39.3 C) 98.5 F (36.9 C) 98.3 F (36.8 C)  TempSrc:  Oral Oral Oral  SpO2:    90%  Weight: 86.7 kg     Height:        Examination:  General exam: Elderly male, moderately built and thinly nourished, frail and chronically ill looking lying comfortably propped up in bed.   ENT: Oral mucosa dry.  Thrush noted on tongue.  Mild patchy erythema of posterior pharyngeal wall without exudate or ulcers. Respiratory system: Clear to auscultation. Respiratory effort normal. Cardiovascular system: S1 & S2 heard, RRR. No JVD, murmurs, rubs, gallops or clicks. No pedal edema.  Telemetry personally reviewed: Sinus rhythm. Gastrointestinal system: Abdomen is nondistended, soft and nontender. No organomegaly or masses felt. Normal bowel sounds heard. Central nervous system: Alert and oriented to self and partly to place. No focal neurological deficits. Extremities: Symmetric 5 x 5 power. Skin: Chronic rash on bilateral thighs in a honeycomb pattern reportedly due to use of hot water bag for Raynaud's (this is likely erythema ab igne).  Data Reviewed: I have personally reviewed following labs and imaging studies  CBC: Recent Labs  Lab 10/04/18 1516 10/04/18 1526 10/06/18 0306 10/07/18 0406  WBC 10.7*  --  14.0* 10.2  NEUTROABS 9.3*  --   --  8.2*  HGB 13.9 14.3 12.6* 11.8*  HCT 41.8 42.0 39.9 36.8*  MCV 95.0  --   95.5 94.4  PLT 254  --  232 308   Basic Metabolic Panel: Recent Labs  Lab 10/04/18 1516 10/04/18 1526 10/05/18 0427 10/06/18 0306  NA 131* 130* 134* 137  K 4.1 4.2 3.7 3.8  CL 99 98 106 106  CO2 24  --  23 23  GLUCOSE 175* 175* 127* 105*  BUN 14 16 12 11   CREATININE 1.04 1.00 0.94 0.86  CALCIUM 8.3*  --  7.9* 8.2*   Liver Function Tests: Recent Labs  Lab 10/04/18 1516  AST 18  ALT 21  ALKPHOS 69  BILITOT 1.5*  PROT 5.7*  ALBUMIN 2.8*   CBG: Recent Labs  Lab 10/05/18 0911  GLUCAP 82    Recent Results (from the past 240 hour(s))  Blood Culture (routine x 2)     Status: None   Collection Time: 10/04/18  3:16 PM  Result Value Ref Range Status   Specimen Description BLOOD RIGHT WRIST  Final   Special Requests   Final    BOTTLES DRAWN AEROBIC AND ANAEROBIC Blood Culture adequate volume   Culture   Final    NO GROWTH 5 DAYS Performed at [facility], [address], [place], [place] [address]    Report Status 10/09/2018 FINAL  Final  Group A Strep by PCR     Status: None   Collection  Time: 10/04/18  3:31 PM  Result Value Ref Range Status   Group A Strep by PCR NOT DETECTED NOT DETECTED Final    Comment: Performed at [facility], [address], [place], [place] [address]  Blood Culture (routine x 2)     Status: None   Collection Time: 10/04/18  4:36 PM  Result Value Ref Range Status   Specimen Description BLOOD LEFT HAND  Final   Special Requests   Final    BOTTLES DRAWN AEROBIC AND ANAEROBIC Blood Culture adequate volume   Culture   Final    NO GROWTH 5 DAYS Performed at [facility], [place] [address], [place], [place] [address]    Report Status 10/09/2018 FINAL  Final  Urine culture     Status: None   Collection Time: 10/04/18  4:46 PM  Result Value Ref Range Status   Specimen Description URINE, CLEAN CATCH  Final   Special Requests NONE  Final   Culture   Final    NO GROWTH Performed at [facility], [place] [address]  [place], [place] [address]    Report Status 10/05/2018 FINAL  Final  MRSA PCR Screening     Status: None   Collection Time: 10/07/18  3:07 PM  Result Value Ref Range Status   MRSA by PCR NEGATIVE NEGATIVE Final    Comment:        The GeneXpert MRSA Assay (FDA approved for NASAL specimens only), is one component of a comprehensive MRSA colonization surveillance program. It is not intended to diagnose MRSA infection nor to guide or monitor treatment for MRSA infections. Performed at [facility], [place] [address], [place], [place] [address]          Radiology Studies: No results found.      Scheduled Meds: . carvedilol  3.125 mg Oral BID WC  . chlorhexidine  15 mL Mouth Rinse BID  . donepezil  5 mg Oral QHS  . enoxaparin (LOVENOX) injection  40 mg Subcutaneous Q24H  . famotidine  40 mg Oral QHS  . feeding supplement (ENSURE ENLIVE)  237 mL Oral BID BM  . FLUoxetine  20 mg Oral Daily  . folic acid  1 mg Oral QPM  . loperamide  2 mg Oral QPM  . magnesium oxide  200 mg Oral Daily  . mouth rinse  15 mL Mouth Rinse q12n4p  . mesalamine  4.8 g Oral Q breakfast  . nystatin  5 mL Mouth/Throat QID  . oxybutynin  5 mg Oral BID  . pravastatin  40 mg Oral QHS  . predniSONE  10 mg Oral Q breakfast  . tamsulosin  0.4 mg Oral QPC supper  . vitamin B-12  500 mcg Oral QPM   Continuous Infusions:    LOS: 5 days     Bonnell Public, MD. [facility] Pager [phone]  If 7PM-7AM, please contact night-coverage [email or web address] Password [email or web address] 10/09/2018, 2:14 PM

## 2018-10-10 ENCOUNTER — Inpatient Hospital Stay (HOSPITAL_COMMUNITY): Payer: PPO

## 2018-10-10 DIAGNOSIS — B370 Candidal stomatitis: Secondary | ICD-10-CM

## 2018-10-10 DIAGNOSIS — R0902 Hypoxemia: Secondary | ICD-10-CM

## 2018-10-10 LAB — RESPIRATORY PANEL BY PCR
Adenovirus: NOT DETECTED
BORDETELLA PERTUSSIS-RVPCR: NOT DETECTED
CORONAVIRUS HKU1-RVPPCR: NOT DETECTED
Chlamydophila pneumoniae: NOT DETECTED
Coronavirus 229E: NOT DETECTED
Coronavirus NL63: NOT DETECTED
Coronavirus OC43: NOT DETECTED
Influenza A: NOT DETECTED
Influenza B: NOT DETECTED
Metapneumovirus: NOT DETECTED
Mycoplasma pneumoniae: NOT DETECTED
PARAINFLUENZA VIRUS 2-RVPPCR: NOT DETECTED
Parainfluenza Virus 1: NOT DETECTED
Parainfluenza Virus 3: NOT DETECTED
Parainfluenza Virus 4: NOT DETECTED
RESPIRATORY SYNCYTIAL VIRUS-RVPPCR: NOT DETECTED
Rhinovirus / Enterovirus: NOT DETECTED

## 2018-10-10 LAB — RENAL FUNCTION PANEL
Albumin: 2.2 g/dL — ABNORMAL LOW (ref 3.5–5.0)
Anion gap: 9 (ref 5–15)
BUN: 24 mg/dL — ABNORMAL HIGH (ref 8–23)
CO2: 25 mmol/L (ref 22–32)
Calcium: 8.3 mg/dL — ABNORMAL LOW (ref 8.9–10.3)
Chloride: 98 mmol/L (ref 98–111)
Creatinine, Ser: 1.16 mg/dL (ref 0.61–1.24)
GFR calc Af Amer: >60 mL/min (ref >60)
GFR calc non Af Amer: 57 mL/min — ABNORMAL LOW (ref >60)
Glucose, Bld: 163 mg/dL — ABNORMAL HIGH (ref 70–99)
Phosphorus: 3.9 mg/dL (ref 2.5–4.6)
Potassium: 3.6 mmol/L (ref 3.5–5.1)
Sodium: 132 mmol/L — ABNORMAL LOW (ref 135–145)

## 2018-10-10 LAB — CBC WITH DIFFERENTIAL/PLATELET
Abs Immature Granulocytes: 0.15 10*3/uL — ABNORMAL HIGH (ref 0.00–0.07)
Basophils Absolute: 0 10*3/uL (ref 0.0–0.1)
Basophils Relative: 0 %
Eosinophils Absolute: 0.4 10*3/uL (ref 0.0–0.5)
Eosinophils Relative: 4 %
HCT: 38.7 % — ABNORMAL LOW (ref 39.0–52.0)
Hemoglobin: 12.4 g/dL — ABNORMAL LOW (ref 13.0–17.0)
Immature Granulocytes: 1 %
Lymphocytes Relative: 13 %
Lymphs Abs: 1.4 10*3/uL (ref 0.7–4.0)
MCH: 30 pg (ref 26.0–34.0)
MCHC: 32 g/dL (ref 30.0–36.0)
MCV: 93.5 fL (ref 80.0–100.0)
Monocytes Absolute: 0.7 10*3/uL (ref 0.1–1.0)
Monocytes Relative: 6 %
Neutro Abs: 7.8 10*3/uL — ABNORMAL HIGH (ref 1.7–7.7)
Neutrophils Relative %: 76 %
Platelets: 278 10*3/uL (ref 150–400)
RBC: 4.14 MIL/uL — ABNORMAL LOW (ref 4.22–5.81)
RDW: 15.6 % — ABNORMAL HIGH (ref 11.5–15.5)
WBC: 10.4 10*3/uL (ref 4.0–10.5)
nRBC: 0 % (ref 0.0–0.2)

## 2018-10-10 LAB — MAGNESIUM: Magnesium: 1.9 mg/dL (ref 1.7–2.4)

## 2018-10-10 LAB — ANTINUCLEAR ANTIBODIES, IFA: ANA Ab, IFA: NEGATIVE

## 2018-10-10 MED ORDER — BENZONATATE 100 MG PO CAPS
200.0000 mg | ORAL_CAPSULE | Freq: Three times a day (TID) | ORAL | Status: DC
  Administered 2018-10-10 – 2018-10-13 (×9): 200 mg via ORAL
  Filled 2018-10-10 (×9): qty 2

## 2018-10-10 MED ORDER — IBUPROFEN 100 MG/5ML PO SUSP
600.0000 mg | Freq: Three times a day (TID) | ORAL | Status: AC | PRN
  Administered 2018-10-10: 600 mg via ORAL
  Filled 2018-10-10 (×2): qty 30

## 2018-10-10 MED ORDER — IBUPROFEN 100 MG/5ML PO SUSP
600.0000 mg | Freq: Once | ORAL | Status: AC
  Administered 2018-10-10: 600 mg via ORAL
  Filled 2018-10-10: qty 30

## 2018-10-10 MED ORDER — METHYLPREDNISOLONE SODIUM SUCC 40 MG IJ SOLR
40.0000 mg | Freq: Two times a day (BID) | INTRAMUSCULAR | Status: DC
  Administered 2018-10-10 – 2018-10-13 (×6): 40 mg via INTRAVENOUS
  Filled 2018-10-10 (×6): qty 1

## 2018-10-10 MED ORDER — LOPERAMIDE HCL 2 MG PO CAPS
2.0000 mg | ORAL_CAPSULE | Freq: Every day | ORAL | Status: DC | PRN
  Administered 2018-10-11: 2 mg via ORAL
  Filled 2018-10-10: qty 1

## 2018-10-10 NOTE — Progress Notes (Addendum)
SLP Cancellation Note  Patient Details Name: [name] MRN: [id number] DOB: 1934-11-09   Cancelled treatment:        Arrived to see pt for dysphagia intervention. Daughter present and was extremely upset with her dad's current condition and the MBS done 11/15 in which this daughter was present. She stated, "that test should have never been done, y'all were forcing things down him and now he's like this and maybe going to die" and continued voicing displeasure. SLP apologized for her frustration and provided supportive listening however did not attempt to give clinical reasoning re: pt's appropriateness for test although increased lethargic than day before or one trace episode of penetration present (no aspiration ) etc. as that would have escalated daughter's emotions. She stated "my blood pressure is up; just want you to leave ". Dr. Algis Liming and RN made aware. Will continue to see as able.     [name] 10/10/2018, 9:30 AM   Orbie Pyo Colvin Caroli.Ed Risk analyst [phone] Office [phone]

## 2018-10-10 NOTE — Progress Notes (Signed)
Called to patients room by RN. 02 saturations in the 80's. RN placed on 100% NRB with 02 saturations at 97%. Changed patient over to a 15L HFNC salter. Patient's o2 saturations 96% will wean as tolerated. RT will continue to monitor.

## 2018-10-10 NOTE — Progress Notes (Signed)
Daily Progress Note   Patient Name: Anthony [name]       Date: 10/10/2018 DOB: 1934-11-02  Age: 84 y.o. MRN#: [id number] Attending Physician: Modena Jansky, MD Primary Care Physician: Josetta Huddle, MD Admit Date: 10/04/2018  Reason for Consultation/Follow-up: Psychosocial/spiritual support  Subjective: Patient is sitting up in bed with daughter at bedside. He is shaking and his teeth are chattering. He states he is warm. He has a dry unproductive cough. His daughter states he gets a fever after doing any activity such as working with therapy.  ID following.   Recommend Tessalon Perles for nonproductive dry cough.  Recommend sleep aid such as Melatonin as he is awake at night and sleeps late into the morning.    Length of Stay: 6  Current Medications: Scheduled Meds:  . carvedilol  3.125 mg Oral BID WC  . chlorhexidine  15 mL Mouth Rinse BID  . donepezil  5 mg Oral QHS  . enoxaparin (LOVENOX) injection  40 mg Subcutaneous Q24H  . famotidine  40 mg Oral QHS  . feeding supplement (ENSURE ENLIVE)  237 mL Oral BID BM  . FLUoxetine  20 mg Oral Daily  . folic acid  1 mg Oral QPM  . magnesium oxide  200 mg Oral Daily  . mouth rinse  15 mL Mouth Rinse q12n4p  . mesalamine  4.8 g Oral Q breakfast  . nystatin  5 mL Mouth/Throat QID  . oxybutynin  5 mg Oral BID  . pravastatin  40 mg Oral QHS  . predniSONE  10 mg Oral Q breakfast  . tamsulosin  0.4 mg Oral QPC supper  . vitamin B-12  500 mcg Oral QPM    Continuous Infusions:   PRN Meds: acetaminophen, ALPRAZolam, guaiFENesin-dextromethorphan, loperamide, nitroGLYCERIN  Physical Exam  Constitutional:  Shaking and teeth chattering.   Pulmonary/Chest: Effort normal.  Neurological: He is alert.  Skin: Skin is warm and dry.            Vital Signs: BP 105/62 (BP Location: Left Arm)   Pulse 85   Temp 99.7 F (37.6 C) (Rectal)   Resp 18   Ht 6' 1"  (1.854 m)   Wt 83.6 kg   SpO2 96%   BMI 24.32 kg/m  SpO2: SpO2: 96 % O2 Device: O2 Device: Nasal Cannula O2 Flow  Rate: O2 Flow Rate (L/min): 4 L/min  Intake/output summary:   Intake/Output Summary (Last 24 hours) at 10/10/2018 1523 Last data filed at 10/10/2018 1438 Gross per 24 hour  Intake 180 ml  Output 750 ml  Net -570 ml   LBM: Last BM Date: 10/07/18 Baseline Weight: Weight: 81.6 kg Most recent weight: Weight: 83.6 kg       Palliative Assessment/Data:      Patient Active Problem List   Diagnosis Date Noted  . Sepsis ([place]) 10/04/2018  . Fatty liver 04/06/2018  . Acute pancreatitis 04/04/2018  . Abdominal pain, right lower quadrant 05/28/2015  . Absolute anemia 05/28/2015  . Aortic atherosclerosis ([place]) 05/28/2015  . Altered blood in stool 05/28/2015  . Chronic systolic heart failure ([place]) 05/28/2015  . Syncope and collapse 05/28/2015  . Arteriosclerosis of coronary artery 05/28/2015  . Cough 05/28/2015  . Crohn's disease of large bowel ([place]) 05/28/2015  . D (diarrhea) 05/28/2015  . Displacement of cervical intervertebral disc without myelopathy 05/28/2015  . Benign essential HTN 05/28/2015  . Adenopathy 05/28/2015  . Hypercholesterolemia without hypertriglyceridemia 05/28/2015  . Hypo-osmolality and hyponatremia 05/28/2015  . Postinflammatory pulmonary fibrosis ([place]) 05/28/2015  . Healed myocardial infarct 05/28/2015  . Peptic esophagitis 05/28/2015  . 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 Paroxysmal ventricular tachycardia ([place]) 05/28/2015  . Decreased body weight 05/28/2015  . Weakness 11/05/2014  . Nausea with vomiting 11/05/2014  . Generalized weakness   . Dehydration   . Difficulty walking   . Pulmonary infiltrates 07/16/2014  . Chronic combined systolic and diastolic heart failure ([place]) 05/31/2014  . Essential hypertension  05/31/2014  .  H/O Right inguinal hernia repair 01/15/2012  . Small fat containing right inguinal hernia that has been painful 08/05/2011  . Finger wound, simple, open 10/12/2008  . Benign prostatic hyperplasia without urinary obstruction 04/30/2008  . Synovitis and tenosynovitis 04/30/2008  . HLD (hyperlipidemia) 04/12/2008  . COLONIC POLYPS 11/21/2007  . MI 11/21/2007  . Coronary atherosclerosis 11/21/2007  . GERD 11/21/2007  . Rheumatoid arthritis ([place]) 11/21/2007  . SLEEP APNEA, MILD 11/21/2007    Palliative Care Assessment & Plan     Recommendations/Plan:  Recommend palliative to follow at SNF.  Recommend sleep aid such as Melatonin.   Code Status:    Code Status Orders  (From admission, onward)         Start     Ordered   10/06/18 1447  Do not attempt resuscitation (DNR)  Continuous    Question Answer Comment  In the event of cardiac or respiratory ARREST Do not call a "code blue"   In the event of cardiac or respiratory ARREST Do not perform Intubation, CPR, defibrillation or ACLS   In the event of cardiac or respiratory ARREST Use medication by any route, position, wound care, and other measures to relive pain and suffering. May use oxygen, suction and manual treatment of airway obstruction as needed for comfort.   Comments MOST form in chart      10/06/18 1446        Code Status History    Date Active Date Inactive Code Status Order ID Comments User Context   10/04/2018 2003 10/06/2018 1446 Full Code [id number]  Merton Border, MD ED   04/04/2018 2331 04/07/2018 1803 Full Code [id number]  Bennie Pierini, MD ED   11/05/2014 0541 11/06/2014 1550 Full Code [id number]  Rise Patience, MD Inpatient       Prognosis:   Unable to determine  Discharge Planning:  To Be  Determined    Thank you for allowing the Palliative Medicine Team to assist in the care of this patient.   Total Time 35 min Prolonged Time Billed  no      Greater than 50%  of  this time was spent counseling and coordinating care related to the above assessment and plan.  Asencion Gowda, NP  Please contact Palliative Medicine Team phone at [phone] for questions and concerns.

## 2018-10-10 NOTE — Progress Notes (Signed)
   10/10/18 0031  Vitals  Temp (!) 103 F (39.4 C)  Temp Source Rectal  NP [place] notified. Informed provider patient also appears more lethargic than previously in shift. Awaiting new orders from provider. Will continue to monitor patient.

## 2018-10-10 NOTE — Clinical Social Work Note (Signed)
CSW continues to follow for discharge needs. PT and OT have been unable to work with patient for 4-5 days. Will need updated notes before pursuing SNF.  Dayton Scrape, [place]

## 2018-10-10 NOTE — Progress Notes (Addendum)
PROGRESS NOTE   Anthony [name]  MOQ:[id number]    DOB: 27-Mar-1934    DOA: 10/04/2018  PCP: Josetta Huddle, MD    Brief Narrative:  Patient is an 84 year old male, with past medical history significant for RA on chronic methotrexate, chronic systolic CHF, CAD, NSVT, GERD, HLD, HTN, OSA, dementia, Crohn's disease for which the patient was started on prednisone just a week prior to presentation for suspected Crohn's flare and inability to afford Entocort.  Patient was admitted with fever, progressive weakness with possible recent fall, decreased oral intake, dry cough, intermittent lightheadedness, worsening of chronic urinary incontinence and transient diarrhea that had resolved.  Patient was initially started on vancomycin and Zosyn for suspected sepsis of unclear source.  Despite broad-spectrum antibiotics he continued to have intermittent fever.  Further work-up was pursued, including ESR, CRP, ANA, procalcitonin and CT scan of the chest.  CRP was 8.6, procalcitonin was 0.19.  CT scan of the chest without contrast revealed "New patchy ground-glass opacities bilaterally, perihilar and upper lobe predominant, LEFT slightly greater than RIGHT.  New mild compression fracture deformity of the T5 vertebral body, of uncertain age but most likely chronic based on appearance.  Coronary artery calcifications and aortic atherosclerosis".  Due to persistent fever, infectious disease was consulted.  Infectious disease team suspected viral etiology and discontinued antibiotics.  Ongoing fevers up to 103 F.  Assessment & Plan:   Active Problems:   Sepsis ([place])   Suspected sepsis/SIRS versus fever of unknown origin:  -Infectious disease input is highly appreciated. -Patient is currently off IV vancomycin and Zosyn. -Patient continues to have fever. -Recent yellowish phlegm reported by patient's daughter but sputum sample not appropriate for testing.  Flu panel PCR negative.   UA not suggestive of UTI.     Group A strep by PCR negative.   Chest x-ray shows bibasilar atelectasis.   CT chest without contrast result is as documented above.   Blood cultures x2 from 11/12> neg Procalcitonin level is 0.19.  CRP is greater than 8.   Had fever 103 F last night although patient this morning looked better than he did last Wednesday when I took care of him. He had fever again this afternoon. Repeated CXR 11/18> stable to slightly increased b/l lung opacities ( I personally reviewed). Requested repeat BCx if another fever>101. I discussed with Dr. Baxter Flattery, who suspects BOOP versus COPD and recommends steroids.  Started IV Solu-Medrol 40 mg every 12.  Repeat blood cultures drawn.  Holding antibiotics at this time.  Daughter updated.  Oral thrush:  In the context of chronic steroids.  Nystatin swish and swallow. Improved.  Hyponatremia:  Possibly related to volume depletion. Improved. Mild and intermittent.  Rheumatoid arthritis:  Methotrexate is on hold.     Not on chronic prednisone as per family's report.   Does not follow with rheumatologist outpatient as per family update 11/18. PCP manages meds.    Dementia:  Continue Aricept.  Essential hypertension:  Controlled.  Hyperlipidemia:  Continue statins.  Chronic systolic CHF:  Compensated.   CAD:  Asymptomatic of chest pain.  GERD:  Continue Pepcid.  OSA:  Reportedly did not qualify for CPAP.  Mechanical fall:  PT evaluated and recommend SNF.    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   Continue to taper prednisone.   Continue Lialda. No BM for ~ 3  days. As agreed with pt/family, changed Imodium to PRN and consider laxatives if still no BM. Asymptomatic.  DVT prophylaxis: Lovenox Code Status: Full Family Communication: Discussed in detail with patient's daughter at bedside, updated care and answered questions. Disposition: DC to SNF pending clinical improvement.   Consultants:  [facility] Infectious disease  Procedures:  None  Antimicrobials:  IV vancomycin and cefepime discontinued 10/07/2018. Discontinued Flagyl   Subjective: High fever overnight. Daughter at bedside upset this morning because she feels that his condition worsened last week.  Patient himself states that he feels "fine" denies complaints. Noted fever again 103.8 this afternoon.  Objective:  Vitals:   10/10/18 0624 10/10/18 0955 10/10/18 1240 10/10/18 1532  BP: (!) 145/77 (!) 106/58 105/62   Pulse: 69  85   Resp: 16  18   Temp: (!) 97.5 F (36.4 C)  99.7 F (37.6 C) (!) 103.8 F (39.9 C)  TempSrc: Oral  Rectal Rectal  SpO2: 97% 91% 96%   Weight:      Height:        Examination:  General exam: Elderly male, moderately built and thinly nourished, frail and chronically ill looking lying comfortably propped up in bed.  Looks better than Wednesday last week. ENT: Oral mucosa moist. Respiratory system: clear to auscultation. No increased WOB. Cardiovascular system: S1 & S2 heard, RRR. No JVD, murmurs, rubs, gallops or clicks. No pedal edema. Telemetry personally reviewed: Sinus rhythm. Stable.   Gastrointestinal system: Abdomen is nondistended, soft and nontender. No organomegaly or masses felt. Normal bowel sounds heard. Stable. Central nervous system: Alert and oriented to self and partly to place. No focal neurological deficits. Stable. Extremities: Symmetric 5 x 5 power. Skin: Chronic rash on bilateral thighs in a honeycomb pattern reportedly due to use of hot water bag for Raynaud's (this is likely erythema ab igne).  Data Reviewed: I have  personally reviewed following labs and imaging studies  CBC: Recent Labs  Lab 10/04/18 1516 10/04/18 1526 10/06/18 0306 10/07/18 0406 10/10/18 0550  WBC 10.7*  --  14.0* 10.2 10.4  NEUTROABS 9.3*  --   --  8.2* 7.8*  HGB 13.9 14.3 12.6* 11.8* 12.4*  HCT 41.8 42.0 39.9 36.8* 38.7*  MCV 95.0  --  95.5 94.4 93.5  PLT 254  --  232 245 277   Basic Metabolic Panel: Recent Labs  Lab 10/04/18 1516 10/04/18 1526 10/05/18 0427 10/06/18 0306 10/10/18 0550  NA 131* 130* 134* 137 132*  K 4.1 4.2 3.7 3.8 3.6  CL 99 98 106 106 98  CO2 24  --  _0 GLUCOSE 175* 175* 127* 105* 163*  BUN _1 24*  CREATININE 1.04 1.00 0.94 0.86 1.16  CALCIUM 8.3*  --  7.9* 8.2* 8.3*  MG  --   --   --   --  1.9  PHOS  --   --   --   --  3.9   Liver Function Tests: Recent Labs  Lab 10/04/18 1516 10/10/18 0550  AST 18  --   ALT 21  --   ALKPHOS 69  --   BILITOT 1.5*  --   PROT 5.7*  --   ALBUMIN 2.8* 2.2*  CBG: Recent Labs  Lab 10/05/18 0911  GLUCAP 82    Recent Results (from the past 240 hour(s))  Blood Culture (routine x 2)     Status: None   Collection Time: 10/04/18  3:16 PM  Result Value Ref Range Status   Specimen Description BLOOD RIGHT WRIST  Final   Special Requests   Final    BOTTLES DRAWN AEROBIC AND ANAEROBIC Blood Culture adequate volume   Culture   Final    NO GROWTH 5 DAYS Performed at [facility], [address], [place], [place] [address]    Report Status 10/09/2018 FINAL  Final  Group A Strep by PCR     Status: None   Collection Time: 10/04/18  3:31 PM  Result Value Ref Range Status   Group A Strep by PCR NOT DETECTED NOT DETECTED Final    Comment: Performed at [facility], [place] [address], [place], [place] [address]  Blood Culture (routine x 2)     Status: None   Collection Time: 10/04/18  4:36 PM  Result Value Ref Range Status   Specimen Description BLOOD LEFT HAND  Final   Special Requests   Final    BOTTLES DRAWN AEROBIC AND  ANAEROBIC Blood Culture adequate volume   Culture   Final    NO GROWTH 5 DAYS Performed at [facility], [place] [address] [place], [place] [address]    Report Status 10/09/2018 FINAL  Final  Urine culture     Status: None   Collection Time: 10/04/18  4:46 PM  Result Value Ref Range Status   Specimen Description URINE, CLEAN CATCH  Final   Special Requests NONE  Final   Culture   Final    NO GROWTH Performed at [facility], [place] [address], [place], [place] [address]    Report Status 10/05/2018 FINAL  Final  MRSA PCR Screening     Status: None   Collection Time: 10/07/18  3:07 PM  Result Value Ref Range Status   MRSA by PCR NEGATIVE NEGATIVE Final    Comment:        The GeneXpert MRSA Assay (FDA approved for NASAL specimens only), is one component of a comprehensive MRSA colonization surveillance program. It is not intended to diagnose MRSA infection nor to guide or monitor treatment for MRSA infections. Performed at [facility], [employer] [address] [place], [place] [address]   Expectorated sputum assessment w rflx to resp cult     Status: None   Collection Time: 10/09/18  3:18 PM  Result Value Ref Range Status   Specimen Description SPUTUM  Final   Special Requests NONE  Final   Sputum evaluation   Final    Sputum specimen not acceptable for testing.  Please recollect.   CRITICAL RESULT CALLED TO, READ BACK BY AND VERIFIED WITH:  RN Mikle Bosworth 1817 [phone] FCP Performed at [facility], [place] [address], [place], [place] [address]    Report Status 10/09/2018 FINAL  Final         Radiology Studies: Dg Chest 2 View  Result Date: 10/10/2018 CLINICAL DATA:  Fever. EXAM: CHEST - 2 VIEW COMPARISON:  10/07/2018 CT and 10/04/2018 chest radiograph FINDINGS: This is a low volume study. RIGHT mid lung and LEFT mid and lower lung streaky/airspace opacities are again noted and may be slightly increased from 10/07/2018 CT. No mass, pleural effusion or  pneumothorax. No acute bony abnormalities are present. No  other changes identified. IMPRESSION: Stable to slightly increased bilateral lung opacities, nonspecific but may represent pneumonia. Electronically Signed   By: Margarette Canada M.D.   On: 10/10/2018 10:35        Scheduled Meds: . carvedilol  3.125 mg Oral BID WC  . chlorhexidine  15 mL Mouth Rinse BID  . donepezil  5 mg Oral QHS  . enoxaparin (LOVENOX) injection  40 mg Subcutaneous Q24H  . famotidine  40 mg Oral QHS  . feeding supplement (ENSURE ENLIVE)  237 mL Oral BID BM  . FLUoxetine  20 mg Oral Daily  . folic acid  1 mg Oral QPM  . magnesium oxide  200 mg Oral Daily  . mouth rinse  15 mL Mouth Rinse q12n4p  . mesalamine  4.8 g Oral Q breakfast  . nystatin  5 mL Mouth/Throat QID  . oxybutynin  5 mg Oral BID  . pravastatin  40 mg Oral QHS  . predniSONE  10 mg Oral Q breakfast  . tamsulosin  0.4 mg Oral QPC supper  . vitamin B-12  500 mcg Oral QPM   Continuous Infusions:    LOS: 6 days    Vernell Leep, MD, FACP, [facility]. [facility] Pager [phone]  If 7PM-7AM, please contact night-coverage [email or web address] [facility] 10/10/2018, 4:24 PM

## 2018-10-10 NOTE — Progress Notes (Signed)
Order to give 600 mg of Ibuprofen. Will administer medication and continue to monitor patient.

## 2018-10-10 NOTE — Progress Notes (Signed)
Physical Therapy Treatment Patient Details Name: Anthony [name] MRN: [id number] DOB: [birth year] Today's Date: 10/10/2018    History of Present Illness 84yo male with increased weakness and decreased intake. Diagnosed with sepsis. PMH RA, CHF, dysrhythmia, HTN, cardiac cath, knee scope, wrist surgery, dementia    PT Comments    Patient progressing slowly towards PT goals. Requires more assist with bed mobility and sitting balance today. Daughter present and concerned about pt moving due to spiking fevers with all movement. Requires Max A for sitting balance due to posterior lean. Pt lethargic during session and fatigues easily. Tolerated there ex sitting EOB with assist. Appropriate for SNF. Will follow.   Follow Up Recommendations  SNF     Equipment Recommendations  Rolling walker with 5" wheels;3in1 (PT)    Recommendations for Other Services       Precautions / Restrictions Precautions Precautions: Fall Precaution Comments: Rt lateral lean  Restrictions Weight Bearing Restrictions: No    Mobility  Bed Mobility Overal bed mobility: Needs Assistance Bed Mobility: Supine to Sit;Sit to Supine     Supine to sit: Mod assist;HOB elevated Sit to supine: Max assist;+2 for physical assistance;HOB elevated   General bed mobility comments: Very slow movement; able to initiate moving LEs to EOB, assist with trunk and scooting hips to EOB.  Transfers                 General transfer comment: Deferred secondary to poor sitting balance.   Ambulation/Gait                 Stairs             Wheelchair Mobility    Modified Rankin (Stroke Patients Only)       Balance Overall balance assessment: Needs assistance Sitting-balance support: Feet supported;Bilateral upper extremity supported Sitting balance-Leahy Scale: Poor Sitting balance - Comments: Rt lateral lean and posterior lean, Max A for sitting balance Postural control: Posterior lean;Right  lateral lean                                  Cognition Arousal/Alertness: Lethargic Behavior During Therapy: WFL for tasks assessed/performed Overall Cognitive Status: History of cognitive impairments - at baseline                                 General Comments: dementia at baseline, has short term memory deficits but otherwise behavior WNL and very pleasant       Exercises General Exercises - Lower Extremity Ankle Circles/Pumps: Both;10 reps;Supine Quad Sets: Both;Strengthening;10 reps;Supine Long Arc Quad: Both;10 reps;Seated;Strengthening Heel Slides: Both;AROM;10 reps;Supine    General Comments General comments (skin integrity, edema, etc.): Daughter present during session.      Pertinent Vitals/Pain Pain Assessment: No/denies pain Faces Pain Scale: No hurt    Home Living                      Prior Function            PT Goals (current goals can now be found in the care plan section) Progress towards PT goals: Not progressing toward goals - comment(secondary to spiking fevers/bedbound for 5 days)    Frequency    Min 2X/week      PT Plan Current plan remains appropriate    Co-evaluation  AM-PAC PT "6 Clicks" Daily Activity  Outcome Measure  Difficulty turning over in bed (including adjusting bedclothes, sheets and blankets)?: Unable Difficulty moving from lying on back to sitting on the side of the bed? : Unable Difficulty sitting down on and standing up from a chair with arms (e.g., wheelchair, bedside commode, etc,.)?: Unable Help needed moving to and from a bed to chair (including a wheelchair)?: A Lot Help needed walking in hospital room?: Total Help needed climbing 3-5 steps with a railing? : Total 6 Click Score: 7    End of Session Equipment Utilized During Treatment: Gait belt Activity Tolerance: Patient limited by fatigue Patient left: in bed;with call bell/phone within reach;with bed  alarm set;with family/visitor present Nurse Communication: Mobility status PT Visit Diagnosis: Unsteadiness on feet (R26.81);Muscle weakness (generalized) (M62.81);History of falling (Z91.81);Other abnormalities of gait and mobility (R26.89)     Time: 4239-5320 PT Time Calculation (min) (ACUTE ONLY): 26 min  Charges:  $Therapeutic Exercise: 8-22 mins $Therapeutic Activity: 8-22 mins                     Wray Kearns, [place], DPT Acute Rehabilitation Services Pager [phone] Office [place] 10/10/2018, 2:34 PM

## 2018-10-10 NOTE — Progress Notes (Signed)
[place] for Infectious Disease    Date of Admission:  10/04/2018   Total days of antibiotics 4 (d/c on 11/16)   ID: Anthony [name] is a 84 y.o. male with hx of being on MTX admitted for 1 week history of fevers, worsening shortness of breath  Active Problems:   Sepsis ([place])    Subjective: Still having fevers overnight, though he reports feeling mildly better .still requiring 5L [place] supplementation. His daughter is at his bedside, she reports that she thinks he may have had occasional mice at her parent's home.  Medications:  . benzonatate  200 mg Oral TID  . carvedilol  3.125 mg Oral BID WC  . chlorhexidine  15 mL Mouth Rinse BID  . donepezil  5 mg Oral QHS  . enoxaparin (LOVENOX) injection  40 mg Subcutaneous Q24H  . famotidine  40 mg Oral QHS  . feeding supplement (ENSURE ENLIVE)  237 mL Oral BID BM  . FLUoxetine  20 mg Oral Daily  . folic acid  1 mg Oral QPM  . magnesium oxide  200 mg Oral Daily  . mouth rinse  15 mL Mouth Rinse q12n4p  . mesalamine  4.8 g Oral Q breakfast  . methylPREDNISolone (SOLU-MEDROL) injection  40 mg Intravenous Q12H  . nystatin  5 mL Mouth/Throat QID  . oxybutynin  5 mg Oral BID  . pravastatin  40 mg Oral QHS  . tamsulosin  0.4 mg Oral QPC supper  . vitamin B-12  500 mcg Oral QPM    Objective: Vital signs in last 24 hours: Temp:  [97.5 F (36.4 C)-103.8 F (39.9 C)] 103.8 F (39.9 C) (11/18 1532) Pulse Rate:  [69-85] 85 (11/18 1240) Resp:  [16-18] 18 (11/18 1240) BP: (96-145)/(53-77) 105/62 (11/18 1240) SpO2:  [91 %-97 %] 96 % (11/18 1240) Weight:  [83.6 kg] 83.6 kg (11/18 0603) Physical Exam  Constitutional: He is oriented to person,only. He appears chronically ill and deconditioned. No distress.  HENT:  Mouth/Throat: Oropharynx is clear and moist. No oropharyngeal exudate.  Cardiovascular: Normal rate, regular rhythm and normal heart sounds. Exam reveals no gallop and no friction rub.  No murmur heard.  Pulmonary/Chest:  Effort normal and breath sounds normal. No respiratory distress. He has no wheezes.  Abdominal: Soft. Bowel sounds are decreased. He exhibits mild distension. There is no tenderness.  Lymphadenopathy:  He has no cervical adenopathy.  Neurological: He is alert and oriented to person, only Skin: Skin is warm and dry. No rash noted. No erythema.  Psychiatric: He has a normal mood and affect. His behavior is normal.     Lab Results Recent Labs    10/10/18 0550  WBC 10.4  HGB 12.4*  HCT 38.7*  NA 132*  K 3.6  CL 98  CO2 25  BUN 24*  CREATININE 1.16   Liver Panel Recent Labs    10/10/18 0550  ALBUMIN 2.2*   Sedimentation Rate No results for input(s): ESRSEDRATE in the last 72 hours. C-Reactive Protein No results for input(s): CRP in the last 72 hours.  Microbiology: reviewed Studies/Results: Dg Chest 2 View  Result Date: 10/10/2018 CLINICAL DATA:  Fever. EXAM: CHEST - 2 VIEW COMPARISON:  10/07/2018 CT and 10/04/2018 chest radiograph FINDINGS: This is a low volume study. RIGHT mid lung and LEFT mid and lower lung streaky/airspace opacities are again noted and may be slightly increased from 10/07/2018 CT. No mass, pleural effusion or pneumothorax. No acute bony abnormalities are present. No other changes  identified. IMPRESSION: Stable to slightly increased bilateral lung opacities, nonspecific but may represent pneumonia. Electronically Signed   By: Margarette Canada M.D.   On: 10/10/2018 10:35     Assessment/Plan: Interstitial pneumonia = will recommend doing pulse dose steroids to see help symptoms with oxygen needs. Today's cxr per my read, still suggests possible atypical pneumonia c/w with CT findings COP/BOOP  Oral thrush = continue on nystatin swish swallow  Infectious work up = have ordered respiratory viral panel to rule out other causes  [facility] for Infectious Diseases Cell: [phone] Pager: [phone]  10/10/2018, 4:55 PM

## 2018-10-11 DIAGNOSIS — J8489 Other specified interstitial pulmonary diseases: Secondary | ICD-10-CM

## 2018-10-11 LAB — CREATININE, SERUM: Creatinine, Ser: 1.09 mg/dL (ref 0.61–1.24)

## 2018-10-11 NOTE — Progress Notes (Addendum)
PROGRESS NOTE   Anthony [name]  ZYS:[id number]    DOB: 1934/11/15    DOA: 10/04/2018  PCP: Josetta Huddle, MD    Brief Narrative:  Patient is an 84 year old male, with past medical history significant for RA on chronic methotrexate, chronic systolic CHF, CAD, NSVT, GERD, HLD, HTN, OSA, dementia, Crohn's disease for which the patient was started on prednisone just a week prior to presentation for suspected Crohn's flare and inability to afford Entocort.  Patient was admitted with fever, progressive weakness with possible recent fall, decreased oral intake, dry cough, intermittent lightheadedness, worsening of chronic urinary incontinence and transient diarrhea that had resolved.  Patient was initially started on vancomycin and Zosyn for suspected sepsis of unclear source.  Despite broad-spectrum antibiotics he continued to have intermittent fever.  Further work-up was pursued, including ESR, CRP, ANA, procalcitonin and CT scan of the chest.  CRP was 8.6, procalcitonin was 0.19.  CT scan of the chest without contrast revealed "New patchy ground-glass opacities bilaterally, perihilar and upper lobe predominant, LEFT slightly greater than RIGHT.  New mild compression fracture deformity of the T5 vertebral body, of uncertain age but most likely chronic based on appearance.  Coronary artery calcifications and aortic atherosclerosis".  Due to persistent fever, infectious disease was consulted.  Fevers felt to be due to suspected BOOP versus COP with IV Solu-Medrol.  Fevers resolved, clinically better.  Assessment & Plan:   Active Problems:   Sepsis ([place])   Suspected sepsis/SIRS versus fever of unknown origin:  -Infectious disease input is highly appreciated. -Patient is currently off IV vancomycin and Zosyn. -Patient continued to have fever. -Recent yellowish phlegm reported by patient's daughter but sputum sample not appropriate for testing.  Flu panel PCR negative.  RSV panel negative. UA not  suggestive of UTI.   Group A strep by PCR negative.   Chest x-ray shows bibasilar atelectasis.   CT chest without contrast result is as documented above.   Blood cultures x2 from 11/12> neg.  Follow repeat blood culture sent 11/18 during an episode of fever. Procalcitonin level is 0.19.  CRP is greater than 8.   Had high fevers up to 103 F up to 11/18.  Repeated CXR 11/18> stable to slightly increased b/l lung opacities ( I personally reviewed). I discussed with Dr. Baxter Flattery, who suspects BOOP versus COP and recommends steroids.  Started IV Solu-Medrol 40 mg every 12.  Repeat blood cultures drawn and pending.  Holding antibiotics at this time.  Clinically much better, fevers down, looks stronger.  Continue current treatment.  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  Improving.  Acute respiratory failure with hypoxia Secondary to BOOP Vs COP and required high flow nasal oxygen last night but now down to 3 L/min nasal cannula.  Treatment as above and wean off oxygen as tolerated.  Oral thrush:  In the context of chronic steroids.  Nystatin swish and swallow.  Oral thrush has resolved but continue nystatin due to ongoing steroids.  Hyponatremia:  Possibly related to volume depletion. Improved. Mild and intermittent.  Please follow BMP.  Rheumatoid arthritis:  Methotrexate is on hold.     Not on chronic prednisone as per family's report.   Does not follow with rheumatologist outpatient as per family update 11/18. PCP manages meds.   No acute flare during this admission.  Dementia:  Continue Aricept.  Essential hypertension:  Controlled.  Hyperlipidemia:  Continue statins.  Chronic systolic CHF:  Compensated.   CAD:  Asymptomatic of chest pain.  GERD:  Continue Pepcid.  OSA:  Reportedly did not qualify for CPAP.  Mechanical fall:  PT evaluated and recommend SNF.  As per clinical social worker follow-up, no SNF available thus far.  Dysphagia On modified diet per ST but  reported not eating much.  However with clinical improvement, hopefully will eat better.  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   Currently on IV Solu-Medrol for as noted above, Continue Lialda. No BM for ~ 3-4 days. As agreed with pt/family, changed Imodium to PRN and consider laxatives if still no BM. Asymptomatic.  DVT prophylaxis: Lovenox Code Status: DNR Family Communication: Discussed in detail with patient's daughter at bedside.  Updated care and answered questions. Disposition: DC to SNF pending clinical improvement.   Consultants:  [facility] Infectious [facility]  Procedures:  None  Antimicrobials:  IV vancomycin and cefepime discontinued 10/07/2018. Discontinued Flagyl   Subjective: Patient interviewed and examined along with daughter and RN at bedside.  Both state that he feels much better.  Stronger.  Cough improved.  No fever since last night.  Denies dyspnea or pain elsewhere.  Noted hypoxia last night requiring high flow oxygen.  Objective:  Vitals:   10/11/18 0353 10/11/18 0942 10/11/18 1204 10/11/18 1223  BP: 104/72   106/67  Pulse: 86   90  Resp: 18   18  Temp: (!) 97.4 F (36.3 C) 97.6 F (36.4 C) 98.9 F (37.2 C) 98.1 F (36.7 C)  TempSrc: Oral Oral Rectal   SpO2: 93%   94%  Weight: 83.1 kg     Height:        Examination:  General exam: Elderly male, moderately built and thinly nourished, frail and chronically ill looking, sitting up comfortably in reclining chair.  Looks much improved compared to yesterday. ENT: Oral mucosa moist.  No oral thrush noted. Respiratory system: Slightly harsh breath sounds bilaterally  with scattered few crackles posteriorly.  No wheezing or rhonchi.  No increased work of breathing. Cardiovascular system: S1 & S2 heard, RRR. No JVD, murmurs, rubs, gallops or clicks. No pedal edema.  Telemetry personally reviewed: Had sinus tachycardia in the 130s during episode of fever but now back to sinus rhythm with BBB morphology. Gastrointestinal system: Abdomen is nondistended, soft and nontender. No organomegaly or masses felt. Normal bowel sounds heard. Stable. Central nervous system: Alert and oriented to person and place. No focal neurological deficits. Stable. Extremities: Symmetric 5 x 5 power. Skin: Chronic rash on bilateral thighs in a honeycomb pattern reportedly due to use of hot water bag for Raynaud's (this is likely erythema ab igne).  Data Reviewed: I have personally reviewed following labs and imaging studies  CBC: Recent Labs  Lab 10/04/18 1516 10/04/18 1526 10/06/18 0306 10/07/18 0406 10/10/18 0550  WBC 10.7*  --  14.0* 10.2 10.4  NEUTROABS 9.3*  --   --  8.2* 7.8*  HGB 13.9 14.3 12.6* 11.8* 12.4*  HCT 41.8 42.0 39.9 36.8* 38.7*  MCV 95.0  --  95.5 94.4 93.5  PLT 254  --  232 245 924   Basic Metabolic Panel: Recent Labs  Lab 10/04/18 1516 10/04/18 1526 10/05/18 0427 10/06/18 0306 10/10/18  0550 10/11/18 0415  NA 131* 130* 134* 137 132*  --   K 4.1 4.2 3.7 3.8 3.6  --   CL 99 98 106 106 98  --   CO2 24  --  23 23 25   --   GLUCOSE 175* 175* 127* 105* 163*  --   BUN 14 16 12 11  24*  --   CREATININE 1.04 1.00 0.94 0.86 1.16 1.09  CALCIUM 8.3*  --  7.9* 8.2* 8.3*  --   MG  --   --   --   --  1.9  --   PHOS  --   --   --   --  3.9  --    Liver Function Tests: Recent Labs  Lab 10/04/18 1516 10/10/18 0550  AST 18  --   ALT 21  --   ALKPHOS 69  --   BILITOT 1.5*  --   PROT 5.7*  --   ALBUMIN 2.8* 2.2*   CBG: Recent Labs  Lab 10/05/18 0911  GLUCAP 82    Recent Results (from the past 240 hour(s))  Blood Culture (routine x 2)     Status:  None   Collection Time: 10/04/18  3:16 PM  Result Value Ref Range Status   Specimen Description BLOOD RIGHT WRIST  Final   Special Requests   Final    BOTTLES DRAWN AEROBIC AND ANAEROBIC Blood Culture adequate volume   Culture   Final    NO GROWTH 5 DAYS Performed at [facility], [place] [address], [place], [place] [address]    Report Status 10/09/2018 FINAL  Final  Group A Strep by PCR     Status: None   Collection Time: 10/04/18  3:31 PM  Result Value Ref Range Status   Group A Strep by PCR NOT DETECTED NOT DETECTED Final    Comment: Performed at [facility], [place] [address], [place], [place] [address]  Blood Culture (routine x 2)     Status: None   Collection Time: 10/04/18  4:36 PM  Result Value Ref Range Status   Specimen Description BLOOD LEFT HAND  Final   Special Requests   Final    BOTTLES DRAWN AEROBIC AND ANAEROBIC Blood Culture adequate volume   Culture   Final    NO GROWTH 5 DAYS Performed at [facility], [place] [address], [place], [place] [address]    Report Status 10/09/2018 FINAL  Final  Urine culture     Status: None   Collection Time: 10/04/18  4:46 PM  Result Value Ref Range Status   Specimen Description URINE, CLEAN CATCH  Final   Special Requests NONE  Final   Culture   Final    NO GROWTH Performed at [facility], [place] [address], [place], [place] [address]    Report Status 10/05/2018 FINAL  Final  MRSA PCR Screening     Status: None   Collection Time: 10/07/18  3:07 PM  Result Value Ref Range Status   MRSA by PCR NEGATIVE NEGATIVE Final    Comment:        The GeneXpert MRSA Assay (FDA approved for NASAL specimens only), is one component of a comprehensive MRSA colonization surveillance program. It is not intended to diagnose MRSA infection nor to guide or monitor treatment for MRSA infections. Performed at [facility], [place] [address] [place], [place] [address]   Expectorated sputum assessment w rflx to resp cult  Status: None   Collection Time: 10/09/18  3:18 PM  Result Value Ref Range Status   Specimen Description SPUTUM  Final   Special Requests NONE  Final   Sputum evaluation   Final    Sputum specimen not acceptable for testing.  Please recollect.   CRITICAL RESULT CALLED TO, READ BACK BY AND VERIFIED WITH:  RN Mikle Bosworth 1817 [phone] FCP Performed at [facility], [place] [address], [place], [place] [address]    Report Status 10/09/2018 FINAL  Final  Respiratory Panel by PCR     Status: None   Collection Time: 10/10/18  3:45 PM  Result Value Ref Range Status   Adenovirus NOT DETECTED NOT DETECTED Final   Coronavirus 229E NOT DETECTED NOT DETECTED Final   Coronavirus HKU1 NOT DETECTED NOT DETECTED Final   Coronavirus NL63 NOT DETECTED NOT DETECTED Final   Coronavirus OC43 NOT DETECTED NOT DETECTED Final   Metapneumovirus NOT DETECTED NOT DETECTED Final   Rhinovirus / Enterovirus NOT DETECTED NOT DETECTED Final   Influenza A NOT DETECTED NOT DETECTED Final   Influenza B NOT DETECTED NOT DETECTED Final   Parainfluenza Virus 1 NOT DETECTED NOT DETECTED Final   Parainfluenza Virus 2 NOT DETECTED NOT DETECTED Final   Parainfluenza Virus 3 NOT DETECTED NOT DETECTED Final   Parainfluenza Virus 4 NOT DETECTED NOT DETECTED Final   Respiratory Syncytial Virus NOT DETECTED NOT DETECTED Final   Bordetella pertussis NOT DETECTED NOT DETECTED Final   Chlamydophila pneumoniae NOT DETECTED NOT DETECTED Final   Mycoplasma pneumoniae NOT DETECTED NOT DETECTED Final    Comment: Performed at [facility], [place] [address] [place], [place] [address]         Radiology Studies: Dg Chest 2 View  Result Date: 10/10/2018 CLINICAL DATA:  Fever. EXAM: CHEST - 2 VIEW COMPARISON:  10/07/2018 CT and 10/04/2018 chest radiograph FINDINGS: This is a low volume study. RIGHT mid lung and LEFT mid and lower lung streaky/airspace opacities are again noted and may be slightly increased from 10/07/2018 CT. No  mass, pleural effusion or pneumothorax. No acute bony abnormalities are present. No other changes identified. IMPRESSION: Stable to slightly increased bilateral lung opacities, nonspecific but may represent pneumonia. Electronically Signed   By: Margarette Canada M.D.   On: 10/10/2018 10:35        Scheduled Meds: . benzonatate  200 mg Oral TID  . carvedilol  3.125 mg Oral BID WC  . chlorhexidine  15 mL Mouth Rinse BID  . donepezil  5 mg Oral QHS  . enoxaparin (LOVENOX) injection  40 mg Subcutaneous Q24H  . famotidine  40 mg Oral QHS  . feeding supplement (ENSURE ENLIVE)  237 mL Oral BID BM  . FLUoxetine  20 mg Oral Daily  . folic acid  1 mg Oral QPM  . magnesium oxide  200 mg Oral Daily  . mouth rinse  15 mL Mouth Rinse q12n4p  . mesalamine  4.8 g Oral Q breakfast  . methylPREDNISolone (SOLU-MEDROL) injection  40 mg Intravenous Q12H  . nystatin  5 mL Mouth/Throat QID  . oxybutynin  5 mg Oral BID  . pravastatin  40 mg Oral QHS  . tamsulosin  0.4 mg Oral QPC supper  . vitamin B-12  500 mcg Oral QPM   Continuous Infusions:    LOS: 7 days    Vernell Leep, MD, FACP, [facility]. [facility] Pager [phone]  If 7PM-7AM, please contact night-coverage [email or web address] Password [email or web address] 10/11/2018, 2:12 PM

## 2018-10-11 NOTE — Progress Notes (Signed)
Palliative Medicine RN Note: Rec'd a call from patient's daughter [name] requesting information on private sitters for patient's wife. I explained that all we can do I provide a list of sitter services & that they are usually private pay and expensive. She verbalized understanding.  Placed order for RNCM to provide a list for her so they can coordinate pt's care with his wife's care.  Marjie Skiff Cadyn Fann, RN, BSN, [facility] Palliative Medicine Team 10/11/2018 1:19 PM Office [phone]

## 2018-10-11 NOTE — Clinical Social Work Note (Signed)
Clinical Social Work Assessment  Patient Details  Name: Anthony [name] MRN: [id number] Date of Birth: 1934-03-01  Date of referral:  10/11/18               Reason for consult:  Facility Placement, Discharge Planning                Permission sought to share information with:  Facility Sport and exercise psychologist, Family Supports Permission granted to share information::  Yes, Verbal Permission Granted  Name::     [employer]  Agency::  SNF's  Relationship::  Daughter  Contact Information:  [phone]  Housing/Transportation Living arrangements for the past 2 months:  Single Family Home Source of Information:  Patient, Medical Team, Adult Children Patient Interpreter Needed:  None Criminal Activity/Legal Involvement Pertinent to Current Situation/Hospitalization:  No - Comment as needed Significant Relationships:  Adult Children, Spouse Lives with:  Adult Children, Spouse Do you feel safe going back to the place where you live?  Yes Need for family participation in patient care:  Yes (Comment)  Care giving concerns:  PT recommending SNF placement once medically stable for discharge.   Social Worker assessment / plan:  CSW met with patient. Daughter at bedside. CSW introduced role and explained that PT recommendations would be discussed. Patient and his daughter are agreeable to SNF placement. First preference is [facility] because daughter lives in [place]. CSW has sent referral and left message for admissions coordinator asking her to review. Gave daughter SNF list to review in case [facility] does not have a bed. Patient and daughter aware of $10-$20 per day copays for SNF. Please notify CSW at least 24 hours prior to discharge so bed can be secured and insurance authorization started. No further concerns. CSW encouraged patient and his daughter to contact CSW as needed. CSW will continue to follow patient and his daughter for support and facilitate discharge to SNF once  medically stable.  Employment status:  Retired Forensic scientist:  Other (Comment Required)([employer]) PT Recommendations:  [place] / Referral to community resources:  [place]  Patient/Family's Response to care:  Patient and his daughter agreeable to SNF placement. Patient's family supportive and involved in patient's care. Patient and his daughter appreciated social work intervention.  Patient/Family's Understanding of and Emotional Response to Diagnosis, Current Treatment, and Prognosis:  Patient and his daughter have a good understanding of the reason for admission and his need for rehab prior to returning home. Patient and his daughter appear happy with hospital care. Patient stated he is feeling better today.  Emotional Assessment Appearance:  Appears stated age Attitude/Demeanor/Rapport:  Engaged, Gracious Affect (typically observed):  Accepting, Appropriate, Calm, Pleasant Orientation:  Oriented to Self, Oriented to Place, Oriented to  Time, Oriented to Situation Alcohol / Substance use:  Never Used Psych involvement (Current and /or in the community):  No (Comment)  Discharge Needs  Concerns to be addressed:  Care Coordination Readmission within the last 30 days:  No Current discharge risk:  Dependent with Mobility Barriers to Discharge:  Continued Medical Work up, [place], LCSW 10/11/2018, 11:29 AM

## 2018-10-11 NOTE — Progress Notes (Signed)
  Speech Language Pathology Treatment: Dysphagia  Patient Details Name: Anthony [name] MRN: [id number] DOB: 07-08-1934 Today's Date: 10/11/2018 Time: 7737-3668 SLP Time Calculation (min) (ACUTE ONLY): 11 min  Assessment / Plan / Recommendation Clinical Impression  Pt was alert and sitting OOB in his chair this afternoon with daughter present. They deny any difficulties with swallowing. Pt consumed thin liquids and soft solids without overt difficulty, using strategies with Mod I. SLP reviewed general results of MBS and provided education about recommended strategies, their rationale, and overall good prognosis for return to baseline diet with improved overall endurance. They acknowledged their understanding and agreement with plan. SLP to f/u for tolerance and potential to advance.   HPI HPI: Anthony [name] is a 84 y.o. male who presented to [facility] ED (from home) 10/04/18 with cough and generalized worsening weakness over past 2 days, has been unable to eat or drink. PMH significant for dementia, pneumonia, GERD, CHF, CAD, HTN, hyperlipdemia, heart attack (1996, 2012), sleep apnea. CXR showed bibasilar atelectasis.      SLP Plan  Continue with current plan of care       Recommendations  Diet recommendations: Dysphagia 3 (mechanical soft);Thin liquid Liquids provided via: Cup;Straw Medication Administration: Whole meds with puree Supervision: Patient able to self feed;Intermittent supervision to cue for compensatory strategies Compensations: Slow rate;Small sips/bites Postural Changes and/or Swallow Maneuvers: Seated upright 90 degrees;Upright 30-60 min after meal                Oral Care Recommendations: Oral care BID Follow up Recommendations: Skilled Nursing facility SLP Visit Diagnosis: Dysphagia, oropharyngeal phase (R13.12) Plan: Continue with current plan of care       GO                Anthony [name] 10/11/2018, 3:08 PM  Anthony [name], M.A.  [place] Acute Environmental education officer [phone] Office [phone]

## 2018-10-11 NOTE — Progress Notes (Signed)
Received consult - Needs a list of home sitter/assistance/caregiver options. They are aware it is self pay. This is for family management. Information given to family member as requested. Mindi Slicker [facility] [phone]

## 2018-10-11 NOTE — Progress Notes (Signed)
Patient's O2 sat is 88-89 on 2L of O2. [place]. O2 was increased to 5L and pt O2 sat was still in the 80s. Pt was placed on a non-rebreather and respiraory was paged. Will continue monitor.

## 2018-10-11 NOTE — Progress Notes (Signed)
Occupational Therapy Treatment Patient Details Name: ANKUSH GINTZ MRN: [id number] DOB: 06-21-1934 Today's Date: 10/11/2018    History of present illness 84yo male with increased weakness and decreased intake. Diagnosed with sepsis. PMH RA, CHF, dysrhythmia, HTN, cardiac cath, knee scope, wrist surgery, dementia   OT comments  Pt continues to present with fatigue and requiring increased assistance for functional transfers. Pt agreeable to therapy and very pleasant. Requiring Mod A for stand pivot transfer to recliner to breakfast. SpO2 88% at rest on 3L; dropping to 76% during activity. Facilitating purse lip breathing to assist in return to 90% on 3L. Continue to recommend dc to SNF and will continue to follow acutely as admitted.    Follow Up Recommendations  SNF;Supervision/Assistance - 24 hour    Equipment Recommendations  None recommended by OT    Recommendations for Other Services      Precautions / Restrictions Precautions Precautions: Fall Precaution Comments: Rt lateral lean  Restrictions Weight Bearing Restrictions: No       Mobility Bed Mobility Overal bed mobility: Needs Assistance Bed Mobility: Supine to Sit     Supine to sit: Mod assist;HOB elevated     General bed mobility comments: Mod A to elevate trunk  Transfers Overall transfer level: Needs assistance Equipment used: Rolling walker (2 wheeled) Transfers: Sit to/from [facility] Sit to Stand: Mod assist Stand pivot transfers: Mod assist       General transfer comment: Mod A to power up and then maintain balance during pivot. Utlizing RW for forward weight shift. Pt presenting with significant posterior lean in standing.    Balance Overall balance assessment: Needs assistance Sitting-balance support: Feet supported;Bilateral upper extremity supported Sitting balance-Leahy Scale: Poor Sitting balance - Comments: Rt lateral lean and posterior lean, Max A for sitting  balance Postural control: Posterior lean;Right lateral lean Standing balance support: Single extremity supported Standing balance-Leahy Scale: Poor Standing balance comment: heavy reliance on B UE support                           ADL either performed or assessed with clinical judgement   ADL Overall ADL's : Needs assistance/impaired Eating/Feeding: With caregiver independent assisting;Set up;Supervision/ safety;Sitting Eating/Feeding Details (indicate cue type and reason): Daughter assisting in preparing pt's breakfast. Pt abel to perform self feeding and bringing utensil to mouth                     Toilet Transfer: Stand-pivot;Moderate assistance;RW(simulated to recliner) Toilet Transfer Details (indicate cue type and reason): Pt requiring Mod A for standing balance during pivot to recliner. Noted increased posterior and left lateral lean. Utinilized RW to increased forwward weight shift         Functional mobility during ADLs: Moderate assistance;Rolling walker(stand pivot only) General ADL Comments: fatigues easily     Vision       Perception     Praxis      Cognition Arousal/Alertness: Awake/alert Behavior During Therapy: WFL for tasks assessed/performed Overall Cognitive Status: History of cognitive impairments - at baseline                                 General Comments: dementia at baseline, has short term memory deficits but otherwise behavior WNL and very pleasant         Exercises     Shoulder Instructions  General Comments Daughter present throughout session. SpO2 at 88% on 3L at rest. After activity, SpO2 76% on 3L. Elevating to 7L to assisting in pt to reach 90% and then returned to 3L. Pt with poor purse lip breathing despite cues. Rn notified of SpO2. Pt denies any dizziness    Pertinent Vitals/ Pain       Pain Assessment: No/denies pain Faces Pain Scale: No hurt Pain Intervention(s): Monitored during  session  Home Living                                          Prior Functioning/Environment              Frequency  Min 2X/week        Progress Toward Goals  OT Goals(current goals can now be found in the care plan section)  Progress towards OT goals: Progressing toward goals  Acute Rehab OT Goals Patient Stated Goal: to go to rehab facility  OT Goal Formulation: With patient Time For Goal Achievement: 10/20/18 Potential to Achieve Goals: Good ADL Goals Pt Will Perform Grooming: with supervision;standing(3 activities) Pt Will Perform Upper Body Dressing: with set-up;with supervision;sitting Pt Will Perform Lower Body Dressing: with supervision;sit to/from stand Pt Will Transfer to Toilet: with supervision;ambulating;bedside commode Pt Will Perform Toileting - Clothing Manipulation and hygiene: with supervision;sit to/from stand Pt/caregiver will Perform Home Exercise Program: Increased strength;With theraband;With minimal assist(L shoulder) Additional ADL Goal #1: Pt will perform bed mobility with supervision in preparation for ADL.  Plan Discharge plan remains appropriate    Co-evaluation                 AM-PAC PT "6 Clicks" Daily Activity     Outcome Measure   Help from another person eating meals?: A Little Help from another person taking care of personal grooming?: A Little Help from another person toileting, which includes using toliet, bedpan, or urinal?: A Lot Help from another person bathing (including washing, rinsing, drying)?: A Lot Help from another person to put on and taking off regular upper body clothing?: A Little Help from another person to put on and taking off regular lower body clothing?: A Lot 6 Click Score: 15    End of Session Equipment Utilized During Treatment: Gait belt;Rolling walker;Oxygen  OT Visit Diagnosis: Unsteadiness on feet (R26.81);Other abnormalities of gait and mobility (R26.89);Muscle weakness  (generalized) (M62.81);Other symptoms and signs involving cognitive function;History of falling (Z91.81)   Activity Tolerance Patient tolerated treatment well   Patient Left in bed;with call bell/phone within reach;with bed alarm set;with family/visitor present   Nurse Communication Mobility status        Time: 1856-3149 OT Time Calculation (min): 28 min  Charges: OT General Charges $OT Visit: 1 Visit OT Treatments $Self Care/Home Management : 23-37 mins  [place], OTR/L Acute Rehab Pager: [phone] Office: [place] 10/11/2018, 11:35 AM

## 2018-10-11 NOTE — Clinical Social Work Placement (Signed)
   CLINICAL SOCIAL WORK PLACEMENT  NOTE  Date:  10/11/2018  Patient Details  Name: Anthony [name] MRN: [id number] Date of Birth: [birth year]  Clinical Social Work is seeking post-discharge placement for this patient at the [place] level of care (*CSW will initial, date and re-position this form in  chart as items are completed):  Yes   Patient/family provided with [facility] Work Department's list of facilities offering this level of care within the geographic area requested by the patient (or if unable, by the patient's family).  Yes   Patient/family informed of their freedom to choose among providers that offer the needed level of care, that participate in Medicare, Medicaid or managed care program needed by the patient, have an available bed and are willing to accept the patient.  Yes   Patient/family informed of [facility]'s ownership interest in [facility] and [facility], as well as of the fact that they are under no obligation to receive care at these facilities.  PASRR submitted to EDS on 10/11/18     PASRR number received on       Existing PASRR number confirmed on 10/11/18     FL2 transmitted to all facilities in geographic area requested by pt/family on 10/11/18     FL2 transmitted to all facilities within larger geographic area on       Patient informed that his/her managed care company has contracts with or will negotiate with certain facilities, including the following:            Patient/family informed of bed offers received.  Patient chooses bed at       Physician recommends and patient chooses bed at      Patient to be transferred to   on  .  Patient to be transferred to facility by       Patient family notified on   of transfer.  Name of family member notified:        PHYSICIAN Please sign FL2     Additional Comment:    _______________________________________________ Candie Chroman, LCSW 10/11/2018,  11:32 AM

## 2018-10-11 NOTE — Clinical Social Work Note (Signed)
[facility] declined bed offer. Sent referral out to other local facilities.  [place] Scrape, [place]

## 2018-10-11 NOTE — NC FL2 (Signed)
[place] LEVEL OF CARE SCREENING TOOL     IDENTIFICATION  Patient Name: Anthony [name] Birthdate: 11-Dec-1933 Sex: male Admission Date (Current Location): 10/04/2018  [facility] and [place] Number:  Herbalist and Address:  [facility], [place] [address], [place], [place] [address]      Provider Number: [id number]  Attending Physician Name and Address:  Modena Jansky, MD  Relative Name and Phone Number:       Current Level of Care: Hospital Recommended Level of Care: Skilled Nursing Facility(with palliative) Prior Approval Number:    Date Approved/Denied:   PASRR Number: [id number]  Discharge Plan: SNF(with palliative)    Current Diagnoses: Patient Active Problem List   Diagnosis Date Noted  . Sepsis ([place]) 10/04/2018  . Fatty liver 04/06/2018  . Acute pancreatitis 04/04/2018  . Abdominal pain, right lower quadrant 05/28/2015  . Absolute anemia 05/28/2015  . Aortic atherosclerosis ([place]) 05/28/2015  . Altered blood in stool 05/28/2015  . Chronic systolic heart failure ([place]) 05/28/2015  . Syncope and collapse 05/28/2015  . Arteriosclerosis of coronary artery 05/28/2015  . Cough 05/28/2015  . Crohn's disease of large bowel ([place]) 05/28/2015  . D (diarrhea) 05/28/2015  . Displacement of cervical intervertebral disc without myelopathy 05/28/2015  . Benign essential HTN 05/28/2015  . Adenopathy 05/28/2015  . Hypercholesterolemia without hypertriglyceridemia 05/28/2015  . Hypo-osmolality and hyponatremia 05/28/2015  . Postinflammatory pulmonary fibrosis ([place]) 05/28/2015  . Healed myocardial infarct 05/28/2015  . Peptic esophagitis 05/28/2015  . 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 Paroxysmal ventricular tachycardia ([place]) 05/28/2015  . Decreased body weight 05/28/2015  . Weakness 11/05/2014  . Nausea with vomiting 11/05/2014  . Generalized weakness   . Dehydration   . Difficulty walking   . Pulmonary infiltrates 07/16/2014  .  Chronic combined systolic and diastolic heart failure ([place]) 05/31/2014  . Essential hypertension 05/31/2014  .  H/O Right inguinal hernia repair 01/15/2012  . Small fat containing right inguinal hernia that has been painful 08/05/2011  . Finger wound, simple, open 10/12/2008  . Benign prostatic hyperplasia without urinary obstruction 04/30/2008  . Synovitis and tenosynovitis 04/30/2008  . HLD (hyperlipidemia) 04/12/2008  . COLONIC POLYPS 11/21/2007  . MI 11/21/2007  . Coronary atherosclerosis 11/21/2007  . GERD 11/21/2007  . Rheumatoid arthritis ([place]) 11/21/2007  . SLEEP APNEA, MILD 11/21/2007    Orientation RESPIRATION BLADDER Height & Weight     Self, Time, Situation, Place  O2(New oxygen. Currently down to 6 L HFNC but will have him on nasal canula prior to discharge if not room air.) Incontinent Weight: 183 lb 3.2 oz (83.1 kg) Height:  6' 1"  (185.4 cm)  BEHAVIORAL SYMPTOMS/MOOD NEUROLOGICAL BOWEL NUTRITION STATUS  (None) (None) Continent Diet(DYS 3)  AMBULATORY STATUS COMMUNICATION OF NEEDS Skin   Extensive Assist Verbally Normal                       Personal Care Assistance Level of Assistance  Bathing, Feeding, Dressing Bathing Assistance: Limited assistance Feeding assistance: Limited assistance Dressing Assistance: Limited assistance     Functional Limitations Info  Sight, Hearing, Speech Sight Info: Adequate Hearing Info: Adequate Speech Info: Adequate    SPECIAL CARE FACTORS FREQUENCY  PT (By licensed PT), OT (By licensed OT), Speech therapy     PT Frequency: 5 x week OT Frequency: 5 x week     Speech Therapy Frequency: 5 x week      Contractures Contractures Info: Not present  Additional Factors Info  Code Status, Allergies, Isolation Precautions Code Status Info: DNR Allergies Info: NKDA     Isolation Precautions Info: Droplet     Current Medications (10/11/2018):  This is the current hospital active medication list Current  Facility-Administered Medications  Medication Dose Route Frequency Provider Last Rate Last Dose  . acetaminophen (TYLENOL) tablet 650 mg  650 mg Oral Q6H PRN Modena Jansky, MD   650 mg at 10/10/18 1532  . ALPRAZolam Duanne Moron) tablet 0.25 mg  0.25 mg Oral QHS PRN Bodenheimer, Albert A, NP   0.25 mg at 10/09/18 2240  . benzonatate (TESSALON) capsule 200 mg  200 mg Oral TID Modena Jansky, MD   200 mg at 10/11/18 0944  . carvedilol (COREG) tablet 3.125 mg  3.125 mg Oral BID WC Modena Jansky, MD   3.125 mg at 10/11/18 0944  . chlorhexidine (PERIDEX) 0.12 % solution 15 mL  15 mL Mouth Rinse BID Dana Allan I, MD   15 mL at 10/11/18 0944  . donepezil (ARICEPT) tablet 5 mg  5 mg Oral QHS Hongalgi, Lenis Dickinson, MD   5 mg at 10/10/18 2332  . enoxaparin (LOVENOX) injection 40 mg  40 mg Subcutaneous Q24H Merton Border, MD   40 mg at 10/10/18 2334  . famotidine (PEPCID) tablet 40 mg  40 mg Oral QHS Merton Border, MD   40 mg at 10/10/18 2332  . feeding supplement (ENSURE ENLIVE) (ENSURE ENLIVE) liquid 237 mL  237 mL Oral BID BM Modena Jansky, MD   237 mL at 10/09/18 0923  . FLUoxetine (PROZAC) capsule 20 mg  20 mg Oral Daily Modena Jansky, MD   20 mg at 10/11/18 0944  . folic acid (FOLVITE) tablet 1 mg  1 mg Oral QPM Merton Border, MD   1 mg at 10/10/18 1722  . guaiFENesin-dextromethorphan (ROBITUSSIN DM) 100-10 MG/5ML syrup 5 mL  5 mL Oral Q4H PRN Modena Jansky, MD   5 mL at 10/09/18 2240  . ibuprofen (ADVIL,MOTRIN) 100 MG/5ML suspension 600 mg  600 mg Oral Q8H PRN Modena Jansky, MD   600 mg at 10/10/18 2025  . loperamide (IMODIUM) capsule 2 mg  2 mg Oral Daily PRN Hongalgi, Anand D, MD      . magnesium oxide (MAG-OX) tablet 200 mg  200 mg Oral Daily Merton Border, MD   200 mg at 10/11/18 0944  . MEDLINE mouth rinse  15 mL Mouth Rinse q12n4p Dana Allan I, MD   15 mL at 10/10/18 1725  . mesalamine (LIALDA) EC tablet 4.8 g  4.8 g Oral Q breakfast Merton Border, MD   4.8 g at 10/11/18  0943  . methylPREDNISolone sodium succinate (SOLU-MEDROL) 40 mg/mL injection 40 mg  40 mg Intravenous Q12H Modena Jansky, MD   40 mg at 10/11/18 0650  . nitroGLYCERIN (NITROSTAT) SL tablet 0.4 mg  0.4 mg Sublingual Q5 min PRN Merton Border, MD      . nystatin (MYCOSTATIN) 100000 UNIT/ML suspension 500,000 Units  5 mL Mouth/Throat QID Modena Jansky, MD   500,000 Units at 10/11/18 0944  . oxybutynin (DITROPAN) tablet 5 mg  5 mg Oral BID Merton Border, MD   5 mg at 10/11/18 0944  . pravastatin (PRAVACHOL) tablet 40 mg  40 mg Oral QHS Merton Border, MD   40 mg at 10/10/18 2333  . tamsulosin (FLOMAX) capsule 0.4 mg  0.4 mg Oral QPC supper Merton Border, MD   0.4 mg at 10/10/18  1721  . vitamin B-12 (CYANOCOBALAMIN) tablet 500 mcg  500 mcg Oral QPM Merton Border, MD   500 mcg at 10/10/18 1723     Discharge Medications: Please see discharge summary for a list of discharge medications.  Relevant Imaging Results:  Relevant Lab Results:   Additional Information SS#: [id number]  Candie Chroman, LCSW

## 2018-10-12 DIAGNOSIS — R0902 Hypoxemia: Secondary | ICD-10-CM

## 2018-10-12 DIAGNOSIS — B370 Candidal stomatitis: Secondary | ICD-10-CM

## 2018-10-12 DIAGNOSIS — J84116 Cryptogenic organizing pneumonia: Secondary | ICD-10-CM

## 2018-10-12 DIAGNOSIS — A419 Sepsis, unspecified organism: Principal | ICD-10-CM

## 2018-10-12 DIAGNOSIS — J8489 Other specified interstitial pulmonary diseases: Secondary | ICD-10-CM

## 2018-10-12 DIAGNOSIS — R509 Fever, unspecified: Secondary | ICD-10-CM

## 2018-10-12 LAB — CBC
HCT: 36.2 % — ABNORMAL LOW (ref 39.0–52.0)
Hemoglobin: 12.1 g/dL — ABNORMAL LOW (ref 13.0–17.0)
MCH: 30.9 pg (ref 26.0–34.0)
MCHC: 33.4 g/dL (ref 30.0–36.0)
MCV: 92.6 fL (ref 80.0–100.0)
NRBC: 0 % (ref 0.0–0.2)
PLATELETS: 262 10*3/uL (ref 150–400)
RBC: 3.91 MIL/uL — AB (ref 4.22–5.81)
RDW: 15.2 % (ref 11.5–15.5)
WBC: 15.1 10*3/uL — ABNORMAL HIGH (ref 4.0–10.5)

## 2018-10-12 LAB — BASIC METABOLIC PANEL
ANION GAP: 10 (ref 5–15)
BUN: 32 mg/dL — ABNORMAL HIGH (ref 8–23)
CO2: 25 mmol/L (ref 22–32)
Calcium: 8.6 mg/dL — ABNORMAL LOW (ref 8.9–10.3)
Chloride: 97 mmol/L — ABNORMAL LOW (ref 98–111)
Creatinine, Ser: 1.06 mg/dL (ref 0.61–1.24)
GFR calc Af Amer: >60 mL/min (ref >60)
GLUCOSE: 283 mg/dL — AB (ref 70–99)
POTASSIUM: 3.9 mmol/L (ref 3.5–5.1)
SODIUM: 132 mmol/L — AB (ref 135–145)

## 2018-10-12 NOTE — Progress Notes (Signed)
Occupational Therapy Treatment Patient Details Name: Anthony [name] MRN: [id number] DOB: May 10, 1934 Today's Date: 10/12/2018    History of present illness 84yo male with increased weakness and decreased intake. Diagnosed with sepsis. PMH RA, CHF, dysrhythmia, HTN, cardiac cath, knee scope, wrist surgery, dementia   OT comments  Progressing will with mobility and able to eat and groom with set up. Min assist to transfer with RW, flexed posture. Pt continues to have decreased sitting balance and R side lean.  Follow Up Recommendations  SNF;Supervision/Assistance - 24 hour    Equipment Recommendations  None recommended by OT    Recommendations for Other Services      Precautions / Restrictions Precautions Precautions: Fall Precaution Comments: Rt lateral lean  Restrictions Weight Bearing Restrictions: No       Mobility Bed Mobility Overal bed mobility: Needs Assistance Bed Mobility: Supine to Sit     Supine to sit: Mod assist     General bed mobility comments: Mod A to elevate trunk, increased time, use of rail, HOB up  Transfers Overall transfer level: Needs assistance Equipment used: Rolling walker (2 wheeled) Transfers: Sit to/from [facility] Sit to Stand: Min assist;From elevated surface Stand pivot transfers: Min assist       General transfer comment: assist to rise and steady, cues for upright posture    Balance Overall balance assessment: Needs assistance   Sitting balance-Leahy Scale: Poor Sitting balance - Comments: R lateral lean with tying his gown in front     Standing balance-Leahy Scale: Poor Standing balance comment: heavy reliance on B UE support, flexed posture                           ADL either performed or assessed with clinical judgement   ADL Overall ADL's : Needs assistance/impaired Eating/Feeding: Independent;Sitting Eating/Feeding Details (indicate cue type and reason): opened soda can Grooming:  Wash/dry hands;Wash/dry face;Oral care;Sitting;Set up           Upper Body Dressing : Minimal assistance;Sitting                           Vision       Perception     Praxis      Cognition Arousal/Alertness: Awake/alert Behavior During Therapy: WFL for tasks assessed/performed Overall Cognitive Status: History of cognitive impairments - at baseline                                          Exercises     Shoulder Instructions       General Comments      Pertinent Vitals/ Pain       Pain Assessment: No/denies pain  Home Living                                          Prior Functioning/Environment              Frequency  Min 2X/week        Progress Toward Goals  OT Goals(current goals can now be found in the care plan section)  Progress towards OT goals: Progressing toward goals  Acute Rehab OT Goals Patient Stated Goal: to go to rehab facility  OT Goal  Formulation: With patient Time For Goal Achievement: 10/20/18 Potential to Achieve Goals: Good  Plan Discharge plan remains appropriate    Co-evaluation                 AM-PAC PT "6 Clicks" Daily Activity     Outcome Measure   Help from another person eating meals?: None Help from another person taking care of personal grooming?: A Little Help from another person toileting, which includes using toliet, bedpan, or urinal?: A Lot Help from another person bathing (including washing, rinsing, drying)?: A Lot Help from another person to put on and taking off regular upper body clothing?: A Little Help from another person to put on and taking off regular lower body clothing?: A Lot 6 Click Score: 16    End of Session    OT Visit Diagnosis: Unsteadiness on feet (R26.81);Other abnormalities of gait and mobility (R26.89);Muscle weakness (generalized) (M62.81);Other symptoms and signs involving cognitive function;History of falling (Z91.81)    Activity Tolerance Patient tolerated treatment well   Patient Left in chair;with call bell/phone within reach;with chair alarm set;with family/visitor present   Nurse Communication (ok to give caffeine free coke)        Time: 9861-4830 OT Time Calculation (min): 30 min  Charges: OT General Charges $OT Visit: 1 Visit OT Treatments $Self Care/Home Management : 23-37 mins  Nestor Lewandowsky, OTR/L Acute Rehabilitation Services Pager: [phone] Office: [phone]   Malka So 10/12/2018, 12:24 PM

## 2018-10-12 NOTE — Consult Note (Signed)
   [facility] Inpatient Consult   10/12/2018  YASIEL GOYNE [birth year] [id number]  Follow up:  Went by to speak with patient and family regarding post hospital follow up needs.   Patient is awaiting skilled nursing placement noted and hopeful for [facility] in [place]. Will follow for disposition needs and particular facility.    Natividad Brood, RN BSN [facility] Liaison  [phone] business mobile phone Toll free office [phone]

## 2018-10-12 NOTE — Progress Notes (Addendum)
PROGRESS NOTE    Anthony [name]  QZE:[id number] DOB: Jul 31, 1934 DOA: 10/04/2018 PCP: Josetta Huddle, MD    Brief Narrative:  84 year old male who presented with weakness.  He does have significant past medical history for congestive heart failure, coronary artery disease, rheumatoid arthritis, and dementia.  Reported cough and tonsillar pain for 7 days, associated with generalized weakness and somnolence.  His initial physical examination blood pressure 100/64, heart rate 72, temperature 98.2, respiratory rate 18, oxygen saturation 93%.  Dry mucous membranes, lungs with no significant wheezing, heart S1-S2 present and rhythmic, abdomen soft, no lower extremity edema.  Sodium 131, potassium 4.1, chloride 99, bicarb 24, glucose 175, BUN 14, creatinine 1.0, 10.7, hemoglobin 13.9, hematocrit 41.8, platelets 254.  Urinalysis negative for infection.  Chest x-ray with patchy infiltrates bilaterally lower lobes and left upper lobe.  EKG, sinus rhythm, left axis deviation, left anterior fascicular block, poor R wave progression.   Patient was admitted to the hospital with a working diagnosis of sepsis.   Assessment & Plan:   Active Problems:   Sepsis ([place])   Fever   Nonspecific interstitial pneumonia (HCC)   Febrile illness   Thrush   Hypoxia  1. Cryptogenic organizing pneumonia (BooP) with acute hypoxic respiratory failure (present on admission) Dyspnea has been improving with the use of systemic steroids, will need a long taper. Continue oxymetry monitoring and supplemental 02 per [place]. Chest film and Ct chest personally reviewed noted bilateral patchy infiltrates. Patient had mold exposure at home/ possible hypersensitivity pneumonitis.   2. Hyponatremia. Stable Na at 132, will continue to encourage po intake, clinically patient is euvolemic. Renal function preserved with serum cr at 1,0, serum bicarbonate at 25.   3. Systolic heart failure. Clinically euvolemic, continue carvedilol,   4. HTN.  Continue blood pressure control  5. CAD. Patient has remained chest pain free  6. Dementia. No agitation, patient's daughter at the bedside, continue donepezil and fluoxetine.    7. Dysphagia. Tolerating po well,continue aspiration precautions.   8. RA. No active joint pain, will resume methotrexate at discharge.   DVT prophylaxis: enoxaparin   Code Status: DNR Family Communication:I spoke with patient's daughter at the bedside and all questions were addressed.   Disposition Plan/ discharge barriers: SNF   Body mass index is 23.8 kg/m. Malnutrition Type:      Malnutrition Characteristics:      Nutrition Interventions:     RN Pressure Injury Documentation:     Consultants:   ID  Procedures:     Antimicrobials:       Subjective: Dyspnea continue to improve today, not back to baseline, no nausea or vomiting, no chest pain. At home found mold and dead mice, per her daughter.   Objective: Vitals:   10/11/18 1204 10/11/18 1223 10/11/18 1939 10/12/18 0421  BP:  106/67 109/65 110/62  Pulse:  90 81 65  Resp:  18 18 18   Temp: 98.9 F (37.2 C) 98.1 F (36.7 C) 98.3 F (36.8 C) (!) 97.3 F (36.3 C)  TempSrc: Rectal  Oral Oral  SpO2:  94% 95% 92%  Weight:    81.8 kg  Height:        Intake/Output Summary (Last 24 hours) at 10/12/2018 1045 Last data filed at 10/12/2018 0919 Gross per 24 hour  Intake 1230 ml  Output 1850 ml  Net -620 ml   Filed Weights   10/10/18 0603 10/11/18 0353 10/12/18 0421  Weight: 83.6 kg 83.1 kg 81.8 kg    Examination:  General: deconditioned  Neurology: Awake and alert, non focal  E ENT: mild pallor, no icterus, oral mucosa moist Cardiovascular: No JVD. S1-S2 present, rhythmic, no gallops, rubs, or murmurs. No lower extremity edema. Pulmonary: decreased breath sounds bilaterally, adequate air movement, no wheezing, rhonchi or rales. Gastrointestinal. Abdomen with no organomegaly, non tender, no rebound or  guarding Skin. No rashes Musculoskeletal: no joint deformities     Data Reviewed: I have personally reviewed following labs and imaging studies  CBC: Recent Labs  Lab 10/06/18 0306 10/07/18 0406 10/10/18 0550 10/12/18 0348  WBC 14.0* 10.2 10.4 15.1*  NEUTROABS  --  8.2* 7.8*  --   HGB 12.6* 11.8* 12.4* 12.1*  HCT 39.9 36.8* 38.7* 36.2*  MCV 95.5 94.4 93.5 92.6  PLT 232 245 278 502   Basic Metabolic Panel: Recent Labs  Lab 10/06/18 0306 10/10/18 0550 10/11/18 0415 10/12/18 0348  NA 137 132*  --  132*  K 3.8 3.6  --  3.9  CL 106 98  --  97*  CO2 23 25  --  25  GLUCOSE 105* 163*  --  283*  BUN 11 24*  --  32*  CREATININE 0.86 1.16 1.09 1.06  CALCIUM 8.2* 8.3*  --  8.6*  MG  --  1.9  --   --   PHOS  --  3.9  --   --    GFR: Estimated Creatinine Clearance: 60.7 mL/min (by C-G formula based on SCr of 1.06 mg/dL). Liver Function Tests: Recent Labs  Lab 10/10/18 0550  ALBUMIN 2.2*   No results for input(s): LIPASE, AMYLASE in the last 168 hours. No results for input(s): AMMONIA in the last 168 hours. Coagulation Profile: No results for input(s): INR, PROTIME in the last 168 hours. Cardiac Enzymes: No results for input(s): CKTOTAL, CKMB, CKMBINDEX, TROPONINI in the last 168 hours. BNP (last 3 results) No results for input(s): PROBNP in the last 8760 hours. HbA1C: No results for input(s): HGBA1C in the last 72 hours. CBG: No results for input(s): GLUCAP in the last 168 hours. Lipid Profile: No results for input(s): CHOL, HDL, LDLCALC, TRIG, CHOLHDL, LDLDIRECT in the last 72 hours. Thyroid Function Tests: No results for input(s): TSH, T4TOTAL, FREET4, T3FREE, THYROIDAB in the last 72 hours. Anemia Panel: No results for input(s): VITAMINB12, FOLATE, FERRITIN, TIBC, IRON, RETICCTPCT in the last 72 hours.    Radiology Studies: I have reviewed all of the imaging during this hospital visit personally     Scheduled Meds: . benzonatate  200 mg Oral TID  .  carvedilol  3.125 mg Oral BID WC  . chlorhexidine  15 mL Mouth Rinse BID  . donepezil  5 mg Oral QHS  . enoxaparin (LOVENOX) injection  40 mg Subcutaneous Q24H  . famotidine  40 mg Oral QHS  . feeding supplement (ENSURE ENLIVE)  237 mL Oral BID BM  . FLUoxetine  20 mg Oral Daily  . folic acid  1 mg Oral QPM  . magnesium oxide  200 mg Oral Daily  . mouth rinse  15 mL Mouth Rinse q12n4p  . mesalamine  4.8 g Oral Q breakfast  . methylPREDNISolone (SOLU-MEDROL) injection  40 mg Intravenous Q12H  . nystatin  5 mL Mouth/Throat QID  . oxybutynin  5 mg Oral BID  . pravastatin  40 mg Oral QHS  . tamsulosin  0.4 mg Oral QPC supper  . vitamin B-12  500 mcg Oral QPM   Continuous Infusions:   LOS: 8 days  Kaziyah Parkison Gerome Apley, MD [facility] Pager [phone]

## 2018-10-12 NOTE — Clinical Social Work Note (Addendum)
[facility] is able to take patient when stable. Patient and daughter aware. Per MD, he may be ready for discharge tomorrow. Left voicemail for [employer]. Will start authorization when they call back.  Dayton Scrape, [place] [phone]  3:59 pm [employer] authorization.  Dayton Scrape, [place]

## 2018-10-12 NOTE — NC FL2 (Signed)
[place] LEVEL OF CARE SCREENING TOOL     IDENTIFICATION  Patient Name: Anthony [name] Birthdate: 1934-03-21 Sex: male Admission Date (Current Location): 10/04/2018  [facility] and [place] Number:  Herbalist and Address:  [facility], [place] [address], [place], [place] [address]      Provider Number: [id number]  Attending Physician Name and Address:  Tawni Millers  Relative Name and Phone Number:       Current Level of Care: Hospital Recommended Level of Care: Skilled Nursing Facility(with palliative) Prior Approval Number:    Date Approved/Denied:   PASRR Number: [id number]  Discharge Plan: SNF(with palliative)    Current Diagnoses: Patient Active Problem List   Diagnosis Date Noted  . Fever   . Nonspecific interstitial pneumonia ([place])   . Febrile illness   . Thrush   . Hypoxia   . Sepsis ([place]) 10/04/2018  . Fatty liver 04/06/2018  . Acute pancreatitis 04/04/2018  . Abdominal pain, right lower quadrant 05/28/2015  . Absolute anemia 05/28/2015  . Aortic atherosclerosis ([place]) 05/28/2015  . Altered blood in stool 05/28/2015  . Chronic systolic heart failure ([place]) 05/28/2015  . Syncope and collapse 05/28/2015  . Arteriosclerosis of coronary artery 05/28/2015  . Cough 05/28/2015  . Crohn's disease of large bowel ([place]) 05/28/2015  . D (diarrhea) 05/28/2015  . Displacement of cervical intervertebral disc without myelopathy 05/28/2015  . Benign essential HTN 05/28/2015  . Adenopathy 05/28/2015  . Hypercholesterolemia without hypertriglyceridemia 05/28/2015  . Hypo-osmolality and hyponatremia 05/28/2015  . Postinflammatory pulmonary fibrosis ([place]) 05/28/2015  . Healed myocardial infarct 05/28/2015  . Peptic esophagitis 05/28/2015  . 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 Paroxysmal ventricular tachycardia ([place]) 05/28/2015  . Decreased body weight 05/28/2015  . Weakness 11/05/2014  . Nausea with vomiting 11/05/2014   . Generalized weakness   . Dehydration   . Difficulty walking   . Pulmonary infiltrates 07/16/2014  . Chronic combined systolic and diastolic heart failure ([place]) 05/31/2014  . Essential hypertension 05/31/2014  .  H/O Right inguinal hernia repair 01/15/2012  . Small fat containing right inguinal hernia that has been painful 08/05/2011  . Finger wound, simple, open 10/12/2008  . Benign prostatic hyperplasia without urinary obstruction 04/30/2008  . Synovitis and tenosynovitis 04/30/2008  . HLD (hyperlipidemia) 04/12/2008  . COLONIC POLYPS 11/21/2007  . MI 11/21/2007  . Coronary atherosclerosis 11/21/2007  . GERD 11/21/2007  . Rheumatoid arthritis ([place]) 11/21/2007  . SLEEP APNEA, MILD 11/21/2007    Orientation RESPIRATION BLADDER Height & Weight     Self, Time, Situation, Place  O2(Nasal Canula 2 L) Incontinent Weight: 180 lb 6.4 oz (81.8 kg) Height:  6' 1"  (185.4 cm)  BEHAVIORAL SYMPTOMS/MOOD NEUROLOGICAL BOWEL NUTRITION STATUS  (None) (None) Continent Diet(DYS 3)  AMBULATORY STATUS COMMUNICATION OF NEEDS Skin   Extensive Assist Verbally Normal                       Personal Care Assistance Level of Assistance  Bathing, Feeding, Dressing Bathing Assistance: Limited assistance Feeding assistance: Limited assistance Dressing Assistance: Limited assistance     Functional Limitations Info  Sight, Hearing, Speech Sight Info: Adequate Hearing Info: Adequate Speech Info: Adequate    SPECIAL CARE FACTORS FREQUENCY  PT (By licensed PT), OT (By licensed OT), Speech therapy     PT Frequency: 5 x week OT Frequency: 5 x week     Speech Therapy Frequency: 5 x week      Contractures  Contractures Info: Not present    Additional Factors Info  Code Status, Allergies Code Status Info: DNR Allergies Info: NKDA          Current Medications (10/12/2018):  This is the current hospital active medication list Current Facility-Administered Medications  Medication Dose  Route Frequency Provider Last Rate Last Dose  . acetaminophen (TYLENOL) tablet 650 mg  650 mg Oral Q6H PRN Modena Jansky, MD   650 mg at 10/10/18 1532  . ALPRAZolam Duanne Moron) tablet 0.25 mg  0.25 mg Oral QHS PRN Arby Barrette A, NP   0.25 mg at 10/11/18 2310  . benzonatate (TESSALON) capsule 200 mg  200 mg Oral TID Modena Jansky, MD   200 mg at 10/12/18 0913  . carvedilol (COREG) tablet 3.125 mg  3.125 mg Oral BID WC Modena Jansky, MD   3.125 mg at 10/12/18 0914  . chlorhexidine (PERIDEX) 0.12 % solution 15 mL  15 mL Mouth Rinse BID Dana Allan I, MD   15 mL at 10/12/18 0915  . donepezil (ARICEPT) tablet 5 mg  5 mg Oral QHS Hongalgi, Lenis Dickinson, MD   5 mg at 10/11/18 2133  . enoxaparin (LOVENOX) injection 40 mg  40 mg Subcutaneous Q24H Merton Border, MD   40 mg at 10/11/18 2133  . famotidine (PEPCID) tablet 40 mg  40 mg Oral QHS Merton Border, MD   40 mg at 10/11/18 2134  . feeding supplement (ENSURE ENLIVE) (ENSURE ENLIVE) liquid 237 mL  237 mL Oral BID BM Modena Jansky, MD   237 mL at 10/12/18 0915  . FLUoxetine (PROZAC) capsule 20 mg  20 mg Oral Daily Modena Jansky, MD   20 mg at 10/12/18 0914  . folic acid (FOLVITE) tablet 1 mg  1 mg Oral QPM Merton Border, MD   1 mg at 10/11/18 1812  . guaiFENesin-dextromethorphan (ROBITUSSIN DM) 100-10 MG/5ML syrup 5 mL  5 mL Oral Q4H PRN Modena Jansky, MD   5 mL at 10/09/18 2240  . ibuprofen (ADVIL,MOTRIN) 100 MG/5ML suspension 600 mg  600 mg Oral Q8H PRN Modena Jansky, MD   600 mg at 10/10/18 2025  . loperamide (IMODIUM) capsule 2 mg  2 mg Oral Daily PRN Modena Jansky, MD   2 mg at 10/11/18 1630  . magnesium oxide (MAG-OX) tablet 200 mg  200 mg Oral Daily Merton Border, MD   200 mg at 10/12/18 0914  . MEDLINE mouth rinse  15 mL Mouth Rinse q12n4p Dana Allan I, MD   15 mL at 10/10/18 1725  . mesalamine (LIALDA) EC tablet 4.8 g  4.8 g Oral Q breakfast Merton Border, MD   4.8 g at 10/12/18 0914  . methylPREDNISolone sodium  succinate (SOLU-MEDROL) 40 mg/mL injection 40 mg  40 mg Intravenous Q12H Hongalgi, Anand D, MD   40 mg at 10/12/18 0529  . nitroGLYCERIN (NITROSTAT) SL tablet 0.4 mg  0.4 mg Sublingual Q5 min PRN Merton Border, MD      . nystatin (MYCOSTATIN) 100000 UNIT/ML suspension 500,000 Units  5 mL Mouth/Throat QID Modena Jansky, MD   500,000 Units at 10/12/18 0914  . oxybutynin (DITROPAN) tablet 5 mg  5 mg Oral BID Merton Border, MD   5 mg at 10/12/18 0914  . pravastatin (PRAVACHOL) tablet 40 mg  40 mg Oral QHS Merton Border, MD   40 mg at 10/11/18 2133  . tamsulosin (FLOMAX) capsule 0.4 mg  0.4 mg Oral QPC supper Merton Border, MD  0.4 mg at 10/11/18 1812  . vitamin B-12 (CYANOCOBALAMIN) tablet 500 mcg  500 mcg Oral QPM Merton Border, MD   500 mcg at 10/11/18 1812     Discharge Medications: Please see discharge summary for a list of discharge medications.  Relevant Imaging Results:  Relevant Lab Results:   Additional Information SS#: [id number]  Candie Chroman, LCSW

## 2018-10-12 NOTE — Progress Notes (Addendum)
[facility] for Infectious Disease    Date of Admission:  10/04/2018   Total days of antibiotics 4  ID: Anthony [name] is a 84 y.o. male with interstitial pneumonia concerning for cop/boop Active Problems:   Sepsis ([place])    Subjective: Patient feels much better than yesterday, afebrile. Sitting comfortably in chair. His daughter "looks so much better" O: now only on 3LNC supp  Medications:  . benzonatate  200 mg Oral TID  . carvedilol  3.125 mg Oral BID WC  . chlorhexidine  15 mL Mouth Rinse BID  . donepezil  5 mg Oral QHS  . enoxaparin (LOVENOX) injection  40 mg Subcutaneous Q24H  . famotidine  40 mg Oral QHS  . feeding supplement (ENSURE ENLIVE)  237 mL Oral BID BM  . FLUoxetine  20 mg Oral Daily  . folic acid  1 mg Oral QPM  . magnesium oxide  200 mg Oral Daily  . mouth rinse  15 mL Mouth Rinse q12n4p  . mesalamine  4.8 g Oral Q breakfast  . methylPREDNISolone (SOLU-MEDROL) injection  40 mg Intravenous Q12H  . nystatin  5 mL Mouth/Throat QID  . oxybutynin  5 mg Oral BID  . pravastatin  40 mg Oral QHS  . tamsulosin  0.4 mg Oral QPC supper  . vitamin B-12  500 mcg Oral QPM    Objective: Vital signs in last 24 hours: Temp:  [97.3 F (36.3 C)-98.9 F (37.2 C)] 97.3 F (36.3 C) (11/20 0421) Pulse Rate:  [65-90] 65 (11/20 0421) Resp:  [18] 18 (11/20 0421) BP: (106-110)/(62-67) 110/62 (11/20 0421) SpO2:  [92 %-95 %] 92 % (11/20 0421) Weight:  [81.8 kg] 81.8 kg (11/20 0421)  Physical Exam  Constitutional: He is oriented to person, place, and time. He appears stated aged and under-nourished. No distress.  HENT: wearing [place] o2 sup Mouth/Throat: Oropharynx is clear and moist. No oropharyngeal exudate.  Cardiovascular: Normal rate, regular rhythm and normal heart sounds. Exam reveals no gallop and no friction rub.  No murmur heard.  Pulmonary/Chest: Effort normal and breath sounds normal. No respiratory distress. He has no wheezes.  Abdominal: Soft. Bowel sounds  are normal. He exhibits no distension. There is no tenderness.  Lymphadenopathy:  He has no cervical adenopathy.  Neurological: He is alert and oriented to person, place, and time.  Skin: Skin is warm and dry. No rash noted. No erythema.  Psychiatric: He has a normal mood and affect. His behavior is normal.     Lab Results Recent Labs    10/10/18 0550 10/11/18 0415 10/12/18 0348  WBC 10.4  --  15.1*  HGB 12.4*  --  12.1*  HCT 38.7*  --  36.2*  NA 132*  --  132*  K 3.6  --  3.9  CL 98  --  97*  CO2 25  --  25  BUN 24*  --  32*  CREATININE 1.16 1.09 1.06   Liver Panel Recent Labs    10/10/18 0550  ALBUMIN 2.2*    Microbiology: reviewed Studies/Results: Dg Chest 2 View  Result Date: 10/10/2018 CLINICAL DATA:  Fever. EXAM: CHEST - 2 VIEW COMPARISON:  10/07/2018 CT and 10/04/2018 chest radiograph FINDINGS: This is a low volume study. RIGHT mid lung and LEFT mid and lower lung streaky/airspace opacities are again noted and may be slightly increased from 10/07/2018 CT. No mass, pleural effusion or pneumothorax. No acute bony abnormalities are present. No other changes identified. IMPRESSION: Stable to slightly  increased bilateral lung opacities, nonspecific but may represent pneumonia. Electronically Signed   By: Margarette Canada M.D.   On: 10/10/2018 10:35     Assessment/Plan: Interstitial pneumonia/pneumonitis - possibly cop/boop. Appears to have significant improvement since starting steroids. Appears improving. Recommend to continue with steroids, defer to dr Algis Liming or side bar pulmonary to how to do taper. Maybe worth following up also with pulmonary as outpatient  Fevers= afebrile today. Anticipate that the steroids will blunt his fevers as well. No other signs to suggest bacterial infection at this time.  Oral thrush = would do 10d of nystatin swish swallow  Will sign off.  [facility] for Infectious Diseases Cell: [phone] Pager:  [phone]  10/12/2018, 8:39 AM

## 2018-10-12 NOTE — Clinical Social Work Note (Signed)
Provided bed offers to patient and daughter. Daughter not happy with them and wants to know about [facility]. They have not responded to referral yet so left message for admissions coordinator asking her to review.  Dayton Scrape, [place]

## 2018-10-12 NOTE — Progress Notes (Signed)
Daily Progress Note   Patient Name: Anthony [name]       Date: 10/12/2018 DOB: [birth year]  Age: 84 y.o. MRN#: [id number] Attending Physician: Tawni Millers Primary Care Physician: Josetta Huddle, MD Admit Date: 10/04/2018  Reason for Consultation/Follow-up: Psychosocial/spiritual support  Subjective: Patient is resting in bed. Daughter [name] at bedside. He is feeling better. No concerns today.   Length of Stay: 8  Current Medications: Scheduled Meds:  . benzonatate  200 mg Oral TID  . carvedilol  3.125 mg Oral BID WC  . chlorhexidine  15 mL Mouth Rinse BID  . donepezil  5 mg Oral QHS  . enoxaparin (LOVENOX) injection  40 mg Subcutaneous Q24H  . famotidine  40 mg Oral QHS  . feeding supplement (ENSURE ENLIVE)  237 mL Oral BID BM  . FLUoxetine  20 mg Oral Daily  . folic acid  1 mg Oral QPM  . magnesium oxide  200 mg Oral Daily  . mouth rinse  15 mL Mouth Rinse q12n4p  . mesalamine  4.8 g Oral Q breakfast  . methylPREDNISolone (SOLU-MEDROL) injection  40 mg Intravenous Q12H  . nystatin  5 mL Mouth/Throat QID  . oxybutynin  5 mg Oral BID  . pravastatin  40 mg Oral QHS  . tamsulosin  0.4 mg Oral QPC supper  . vitamin B-12  500 mcg Oral QPM    Continuous Infusions:   PRN Meds: acetaminophen, ALPRAZolam, guaiFENesin-dextromethorphan, ibuprofen, loperamide, nitroGLYCERIN  Physical Exam  Constitutional: No distress.  Pulmonary/Chest: Effort normal.  Neurological: He is alert.  Skin: Skin is warm and dry.            Vital Signs: BP 110/62 (BP Location: Left Arm)   Pulse 65   Temp (!) 97.3 F (36.3 C) (Oral)   Resp 18   Ht 6' 1"  (1.854 m)   Wt 81.8 kg   SpO2 92%   BMI 23.80 kg/m  SpO2: SpO2: 92 % O2 Device: O2 Device: Nasal Cannula O2 Flow Rate: O2 Flow  Rate (L/min): 3 L/min  Intake/output summary:   Intake/Output Summary (Last 24 hours) at 10/12/2018 1129 Last data filed at 10/12/2018 0919 Gross per 24 hour  Intake 1230 ml  Output 1850 ml  Net -620 ml   LBM: Last BM Date: 10/11/18 Baseline Weight: Weight: 81.6 kg  Most recent weight: Weight: 81.8 kg       Palliative Assessment/Data: 50%      Patient Active Problem List   Diagnosis Date Noted  . Fever   . Nonspecific interstitial pneumonia ([place])   . Febrile illness   . Thrush   . Hypoxia   . Sepsis ([place]) 10/04/2018  . Fatty liver 04/06/2018  . Acute pancreatitis 04/04/2018  . Abdominal pain, right lower quadrant 05/28/2015  . Absolute anemia 05/28/2015  . Aortic atherosclerosis ([place]) 05/28/2015  . Altered blood in stool 05/28/2015  . Chronic systolic heart failure ([place]) 05/28/2015  . Syncope and collapse 05/28/2015  . Arteriosclerosis of coronary artery 05/28/2015  . Cough 05/28/2015  . Crohn's disease of large bowel ([place]) 05/28/2015  . D (diarrhea) 05/28/2015  . Displacement of cervical intervertebral disc without myelopathy 05/28/2015  . Benign essential HTN 05/28/2015  . Adenopathy 05/28/2015  . Hypercholesterolemia without hypertriglyceridemia 05/28/2015  . Hypo-osmolality and hyponatremia 05/28/2015  . Postinflammatory pulmonary fibrosis ([place]) 05/28/2015  . Healed myocardial infarct 05/28/2015  . Peptic esophagitis 05/28/2015  . 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 Paroxysmal ventricular tachycardia ([place]) 05/28/2015  . Decreased body weight 05/28/2015  . Weakness 11/05/2014  . Nausea with vomiting 11/05/2014  . Generalized weakness   . Dehydration   . Difficulty walking   . Pulmonary infiltrates 07/16/2014  . Chronic combined systolic and diastolic heart failure ([place]) 05/31/2014  . Essential hypertension 05/31/2014  .  H/O Right inguinal hernia repair 01/15/2012  . Small fat containing right inguinal hernia that has been painful 08/05/2011  . Finger wound,  simple, open 10/12/2008  . Benign prostatic hyperplasia without urinary obstruction 04/30/2008  . Synovitis and tenosynovitis 04/30/2008  . HLD (hyperlipidemia) 04/12/2008  . COLONIC POLYPS 11/21/2007  . MI 11/21/2007  . Coronary atherosclerosis 11/21/2007  . GERD 11/21/2007  . Rheumatoid arthritis ([place]) 11/21/2007  . SLEEP APNEA, MILD 11/21/2007    Palliative Care Assessment & Plan     Recommendations/Plan:  Recommend palliative to follow at SNF. Recommend sleep aid such as Melatonin for proper sleep/wake cycle.   Code Status:    Code Status Orders  (From admission, onward)         Start     Ordered   10/06/18 1447  Do not attempt resuscitation (DNR)  Continuous    Question Answer Comment  In the event of cardiac or respiratory ARREST Do not call a "code blue"   In the event of cardiac or respiratory ARREST Do not perform Intubation, CPR, defibrillation or ACLS   In the event of cardiac or respiratory ARREST Use medication by any route, position, wound care, and other measures to relive pain and suffering. May use oxygen, suction and manual treatment of airway obstruction as needed for comfort.   Comments MOST form in chart      10/06/18 1446        Code Status History    Date Active Date Inactive Code Status Order ID Comments User Context   10/04/2018 2003 10/06/2018 1446 Full Code [id number]  Merton Border, MD ED   04/04/2018 2331 04/07/2018 1803 Full Code [id number]  Bennie Pierini, MD ED   11/05/2014 0541 11/06/2014 1550 Full Code [id number]  Rise Patience, MD Inpatient       Prognosis:   Unable to determine  Discharge Planning:  To SNF with palliative     Thank you for allowing the Palliative Medicine Team to assist in the care of this patient.   Total Time  35 min Prolonged Time Billed  no      Greater than 50%  of this time was spent counseling and coordinating care related to the above assessment and plan.  Asencion Gowda, NP  Please  contact Palliative Medicine Team phone at [phone] for questions and concerns.

## 2018-10-13 DIAGNOSIS — N400 Enlarged prostate without lower urinary tract symptoms: Secondary | ICD-10-CM | POA: Diagnosis not present

## 2018-10-13 DIAGNOSIS — F0390 Unspecified dementia without behavioral disturbance: Secondary | ICD-10-CM | POA: Diagnosis not present

## 2018-10-13 DIAGNOSIS — K5010 Crohn's disease of large intestine without complications: Secondary | ICD-10-CM | POA: Diagnosis not present

## 2018-10-13 DIAGNOSIS — Z7401 Bed confinement status: Secondary | ICD-10-CM | POA: Diagnosis not present

## 2018-10-13 DIAGNOSIS — N3281 Overactive bladder: Secondary | ICD-10-CM | POA: Diagnosis not present

## 2018-10-13 DIAGNOSIS — R0902 Hypoxemia: Secondary | ICD-10-CM | POA: Diagnosis not present

## 2018-10-13 DIAGNOSIS — I5022 Chronic systolic (congestive) heart failure: Secondary | ICD-10-CM | POA: Diagnosis not present

## 2018-10-13 DIAGNOSIS — I5020 Unspecified systolic (congestive) heart failure: Secondary | ICD-10-CM | POA: Diagnosis not present

## 2018-10-13 DIAGNOSIS — B370 Candidal stomatitis: Secondary | ICD-10-CM | POA: Diagnosis not present

## 2018-10-13 DIAGNOSIS — R52 Pain, unspecified: Secondary | ICD-10-CM | POA: Diagnosis not present

## 2018-10-13 DIAGNOSIS — J8489 Other specified interstitial pulmonary diseases: Secondary | ICD-10-CM | POA: Diagnosis not present

## 2018-10-13 DIAGNOSIS — J84116 Cryptogenic organizing pneumonia: Secondary | ICD-10-CM | POA: Diagnosis not present

## 2018-10-13 DIAGNOSIS — M2550 Pain in unspecified joint: Secondary | ICD-10-CM | POA: Diagnosis not present

## 2018-10-13 DIAGNOSIS — M545 Low back pain: Secondary | ICD-10-CM | POA: Diagnosis not present

## 2018-10-13 DIAGNOSIS — Z9981 Dependence on supplemental oxygen: Secondary | ICD-10-CM | POA: Diagnosis not present

## 2018-10-13 DIAGNOSIS — D461 Refractory anemia with ring sideroblasts: Secondary | ICD-10-CM | POA: Diagnosis not present

## 2018-10-13 DIAGNOSIS — E559 Vitamin D deficiency, unspecified: Secondary | ICD-10-CM | POA: Diagnosis not present

## 2018-10-13 DIAGNOSIS — R197 Diarrhea, unspecified: Secondary | ICD-10-CM | POA: Diagnosis not present

## 2018-10-13 DIAGNOSIS — R509 Fever, unspecified: Secondary | ICD-10-CM | POA: Diagnosis not present

## 2018-10-13 DIAGNOSIS — I10 Essential (primary) hypertension: Secondary | ICD-10-CM | POA: Diagnosis not present

## 2018-10-13 DIAGNOSIS — I509 Heart failure, unspecified: Secondary | ICD-10-CM | POA: Diagnosis not present

## 2018-10-13 DIAGNOSIS — J189 Pneumonia, unspecified organism: Secondary | ICD-10-CM | POA: Diagnosis not present

## 2018-10-13 DIAGNOSIS — R079 Chest pain, unspecified: Secondary | ICD-10-CM | POA: Diagnosis not present

## 2018-10-13 DIAGNOSIS — R12 Heartburn: Secondary | ICD-10-CM | POA: Diagnosis not present

## 2018-10-13 DIAGNOSIS — M069 Rheumatoid arthritis, unspecified: Secondary | ICD-10-CM | POA: Diagnosis not present

## 2018-10-13 DIAGNOSIS — A419 Sepsis, unspecified organism: Secondary | ICD-10-CM | POA: Diagnosis not present

## 2018-10-13 DIAGNOSIS — R05 Cough: Secondary | ICD-10-CM | POA: Diagnosis not present

## 2018-10-13 DIAGNOSIS — F329 Major depressive disorder, single episode, unspecified: Secondary | ICD-10-CM | POA: Diagnosis not present

## 2018-10-13 DIAGNOSIS — J9691 Respiratory failure, unspecified with hypoxia: Secondary | ICD-10-CM | POA: Diagnosis not present

## 2018-10-13 DIAGNOSIS — R739 Hyperglycemia, unspecified: Secondary | ICD-10-CM | POA: Diagnosis not present

## 2018-10-13 MED ORDER — PREDNISONE 50 MG PO TABS: 60.0000 mg | ORAL_TABLET | Freq: Every day | ORAL | Status: DC

## 2018-10-13 MED ORDER — GUAIFENESIN-DM 100-10 MG/5ML PO SYRP: 5.0000 mL | ORAL_SOLUTION | ORAL | 0 refills | Status: AC | PRN

## 2018-10-13 MED ORDER — PREDNISONE 20 MG PO TABS: 60.0000 mg | ORAL_TABLET | Freq: Every day | ORAL | 0 refills | Status: AC

## 2018-10-13 NOTE — Progress Notes (Signed)
Palliative Medicine RN Note: Rec'd a call from Mr Ende daughter [name]. She is in his room and has questions about the logistics of d/c to a facility. I called SW Judson Roch & requested she go answer questions, as our team does not handle that.  Marjie Skiff Ennis Delpozo, RN, BSN, [facility] Palliative Medicine Team 10/13/2018 10:26 AM Office [phone]

## 2018-10-13 NOTE — Clinical Social Work Note (Signed)
CSW facilitated patient discharge including contacting patient family and facility to confirm patient discharge plans. Clinical information faxed to facility and family agreeable with plan. CSW arranged ambulance transport via [facility] to [facility]. RN to call report prior to discharge [phone] Room 807B).  CSW will sign off for now as social work intervention is no longer needed. Please consult [place] again if new needs arise.  Dayton Scrape, [place]

## 2018-10-13 NOTE — Clinical Social Work Note (Addendum)
Insurance authorization still pending. CSW spoke with patient and daughter and answered all questions.  Dayton Scrape, CSW [phone]  1:58 pm Authorization still pending. RN is actively working on it. CSW notified them that discharge orders are in.  Dayton Scrape, [place] [phone]  2:30 pm Authorization approved: [id number]. Left message for SNF admissions coordinator.  Dayton Scrape, [place]

## 2018-10-13 NOTE — Progress Notes (Signed)
Patient ready for discharge. Report called.

## 2018-10-13 NOTE — Progress Notes (Signed)
Patient ready for discharge.

## 2018-10-13 NOTE — Discharge Summary (Signed)
Physician Discharge Summary  Anthony [name]:[id number] DOB: 1934-07-16 DOA: 10/04/2018  PCP: Josetta Huddle, MD  Admit date: 10/04/2018 Discharge date: 10/13/2018  Admitted From: Home  Disposition:  SNF  Recommendations for Outpatient Follow-up and new medication changes:  1. Follow up with Dr. Inda Merlin in 7 days.  2. Patient will follow a slow prednisone taper over the next 6 months. 3. Methotrexate has been discontinued.     Home Health: na   Equipment/Devices: na    Discharge Condition: stable  CODE STATUS: DNR   Diet recommendation:  Heart healthy   Brief/Interim Summary: 84 year old male who presented with weakness.  He does have significant past medical history for congestive heart failure, coronary artery disease, rheumatoid arthritis, and dementia.  Reported cough and tonsillar pain for 7 days, associated with generalized weakness and somnolence.  His initial physical examination blood pressure 100/64, heart rate 72, temperature 98.2, respiratory rate 18, oxygen saturation 93%.  Dry mucous membranes, lungs with no significant wheezing, heart S1-S2 present and rhythmic, abdomen soft, no lower extremity edema.  Sodium 131, potassium 4.1, chloride 99, bicarb 24, glucose 175, BUN 14, creatinine 1.0, WBC 10.7, hemoglobin 13.9, hematocrit 41.8, platelets 254.  Urinalysis negative for infection.  Chest x-ray with patchy infiltrates bilaterally lower lobes and left upper lobe.  EKG, sinus rhythm, left axis deviation, left anterior fascicular block, poor R wave progression.   Patient was admitted to the hospital with a working diagnosis of sepsis.   1.  Cryptogenic organizing pneumonia with acute hypoxic respiratory failure (present on admission).  Patient was admitted to the medical ward, he was placed on a remote telemetry monitor, initially received broad-spectrum antibiotic therapy with IV vancomycin, cefepime and metronidazole.  Group A streptococcus PCR was negative.  Patient  remained febrile despite antibiotic therapy, infectious disease was consulted with recommendations to stop antibiotics.  His cultures remain no growth.  The diagnosis of cryptogenic organizing pneumonia was considered, patient was placed on systemic steroids with significant improvement of his symptoms.  Patient's oxygenation improved, discharge oximetry is 97% on 3 L/min of supplemental oxygen per nasal cannula.  Plan to continue slow taper of prednisone over the next 6 months.  Further history from his family, apparently patient had significant mold at his house.  2.  Oral thrush. (Present on admission).  Patient was placed on nystatin with improvement of his symptoms.  3.  Rheumatoid arthritis.  Methotrexate was held to avoid further lung injury, does have Raynaud's disease, but no deforming arthropathy.  Follow-up as outpatient.  4.  Hypertension.  Blood pressure remained well controlled with carvedilol.   5.  Chronic systolic heart failure.  No signs of acute exacerbation. Continue carvedilol.   6.  History of Crohn's disease.  Patient been asymptomatic. Continue mesalamine.   7.  Dementia.  No agitation. Continue donepezil, fluoxetine,   Discharge Diagnoses:  Active Problems:   Sepsis ([place])   Fever   Nonspecific interstitial pneumonia ([place])   Febrile illness   Thrush   Hypoxia    Discharge Instructions   Allergies as of 10/13/2018   No Known Allergies     Medication List    STOP taking these medications   methotrexate 2.5 MG tablet Commonly known as:  RHEUMATREX   polyethylene glycol packet Commonly known as:  MIRALAX / GLYCOLAX   senna 8.6 MG Tabs tablet Commonly known as:  SENOKOT     TAKE these medications   acetaminophen 500 MG tablet Commonly known as:  TYLENOL Take 500  mg by mouth 2 (two) times daily.   B-12 500 MCG Tabs Take 500 mcg by mouth every evening.   CALCIUM-VITAMIN D PO Take 1 tablet by mouth every evening.   carvedilol 6.25 MG  tablet Commonly known as:  COREG TAKE 1 TABLET TWICE A DAY WITH A MEAL. What changed:    how much to take  how to take this  when to take this  additional instructions   Co Q-10 100 MG Caps Take 100 mg by mouth every evening.   donepezil 5 MG tablet Commonly known as:  ARICEPT Take 5 mg by mouth at bedtime.   famotidine 20 MG tablet Commonly known as:  PEPCID Take 40 mg by mouth at bedtime.   feeding supplement (ENSURE ENLIVE) Liqd Take 237 mLs by mouth 2 (two) times daily between meals.   FLUoxetine 20 MG capsule Commonly known as:  PROZAC Take 20 mg by mouth every morning.   folic acid 761 MCG tablet Commonly known as:  FOLVITE Take 400 mcg by mouth every evening.   guaiFENesin-dextromethorphan 100-10 MG/5ML syrup Commonly known as:  ROBITUSSIN DM Take 5 mLs by mouth every 4 (four) hours as needed for cough.   loperamide 2 MG capsule Commonly known as:  IMODIUM Take 2 mg by mouth every evening.   magnesium oxide 400 MG tablet Commonly known as:  MAG-OX Take 200 mg by mouth daily.   mesalamine 1.2 g EC tablet Commonly known as:  LIALDA Take 4.8 g by mouth daily with breakfast.   nitroGLYCERIN 0.4 MG SL tablet Commonly known as:  NITROSTAT Place 0.4 mg under the tongue every 5 (five) minutes as needed. Chest pain   oxybutynin 5 MG tablet Commonly known as:  DITROPAN Take 5 mg by mouth 2 (two) times daily.   pravastatin 40 MG tablet Commonly known as:  PRAVACHOL Take 1 tablet (40 mg total) by mouth at bedtime.   predniSONE 20 MG tablet Commonly known as:  DELTASONE Take 3 tablets (60 mg total) by mouth daily with breakfast. Start taking on:  10/14/2018 What changed:    medication strength  how much to take   tamsulosin 0.4 MG Caps capsule Commonly known as:  FLOMAX Take 0.4 mg by mouth daily after supper.   vitamin A 10000 UNIT capsule Take 10,000 Units by mouth every evening.   Vitamin D3 50 MCG (2000 UT) capsule Take 2,000 Units by  mouth every morning.   vitamin E 400 UNIT capsule Take 400 Units by mouth every evening.      Contact information for after-discharge care    Destination    [facility] Preferred SNF .   Service:  Skilled Nursing Contact information: [place] [phone]             No Known Allergies  Consultations:  ID   Procedures/Studies: Dg Chest 2 View  Result Date: 10/10/2018 CLINICAL DATA:  Fever. EXAM: CHEST - 2 VIEW COMPARISON:  10/07/2018 CT and 10/04/2018 chest radiograph FINDINGS: This is a low volume study. RIGHT mid lung and LEFT mid and lower lung streaky/airspace opacities are again noted and may be slightly increased from 10/07/2018 CT. No mass, pleural effusion or pneumothorax. No acute bony abnormalities are present. No other changes identified. IMPRESSION: Stable to slightly increased bilateral lung opacities, nonspecific but may represent pneumonia. Electronically Signed   By: Margarette Canada M.D.   On: 10/10/2018 10:35   Ct Chest Wo Contrast  Result Date:  10/07/2018 CLINICAL DATA:  Fever of unknown origin. Past medical history of rheumatoid arthritis on chronic methotrexate, chronic systolic CHF, coronary artery disease, GERD, hypertension, dementia, Crohn's disease status post recent prednisone. EXAM: CT CHEST WITHOUT CONTRAST TECHNIQUE: Multidetector CT imaging of the chest was performed following the standard protocol without IV contrast. COMPARISON:  Chest CT dated 07/06/2014. FINDINGS: Cardiovascular: Heart size is within normal limits. No pericardial effusion. Three-vessel coronary artery calcifications. No thoracic aortic aneurysm. Mild aortic atherosclerosis. Mediastinum/Nodes: Scattered small lymph nodes within the mediastinum, similar to previous chest CT from 2015 indicating benignity. No new enlarged lymph nodes appreciated. Esophagus appears normal. Trachea and central bronchi are unremarkable.  Prominence of the LEFT thyroid lobe is stable. Lungs/Pleura: New patchy ground-glass opacities bilaterally, perihilar and upper lobe predominant bilaterally, LEFT slightly greater than RIGHT. No pleural effusion or pneumothorax seen. Upper Abdomen: Limited images of the upper abdomen are unremarkable. Musculoskeletal: New mild compression fracture deformity involving the superior endplate of the T5 vertebral body, of uncertain age but most likely chronic. IMPRESSION: 1. New patchy ground-glass opacities bilaterally, perihilar and upper lobe predominant, LEFT slightly greater than RIGHT. Differential includes atypical pneumonias such as viral or fungal, interstitial pneumonias, edema related to volume overload/CHF, chronic interstitial diseases, and hypersensitivity pneumonitis. Favor atypical pneumonia and/or edema, alternatively methotrexate induced lung disease. 2. New mild compression fracture deformity of the T5 vertebral body, of uncertain age but most likely chronic based on appearance. 3. Coronary artery calcifications. Aortic Atherosclerosis (ICD10-I70.0). Electronically Signed   By: Franki Cabot M.D.   On: 10/07/2018 10:28   Dg Chest Port 1 View  Result Date: 10/04/2018 CLINICAL DATA:  Generalized weakness EXAM: PORTABLE CHEST 1 VIEW COMPARISON:  08/10/2014 FINDINGS: The heart is normal in size. Lungs are under aerated with bibasilar atelectasis. No pneumothorax or pleural effusion. IMPRESSION: Bibasilar atelectasis. Electronically Signed   By: Marybelle Killings M.D.   On: 10/04/2018 15:58   Dg Swallowing Func-speech Pathology  Result Date: 10/07/2018 Objective Swallowing Evaluation: Type of Study: MBS-Modified Barium Swallow Study  Patient Details Name: SHERMON BOZZI MRN: [id number] Date of Birth: 1934/05/03 Today's Date: 10/07/2018 Time: SLP Start Time (ACUTE ONLY): 1026 -SLP Stop Time (ACUTE ONLY): 1048 SLP Time Calculation (min) (ACUTE ONLY): 22 min Past Medical History: Past Medical History:  Diagnosis Date . Arthritis   rheumatoid-  Dr Barkley Boards . CHF (congestive heart failure) (HCC)   chronic systolic heart failure per Dr [name] LOV note 09/23/11.   EKG 4/12, stress test  and cath from 8/12 on chart.  Clearance with LOV Dr [name] on chart . Coronary artery disease  . Crohn disease ([place])  . Dysrhythmia   nonsustained,asymptomatic VT per Dr [name] office note  resulting in cath . GERD (gastroesophageal reflux disease)  . Heart attack ([place]) 1996, 06/2011 . Hyperlipidemia  . Hypertension  . Pneumonia 1993 . Prostate troubles   states take alternating meds for bladder/prostate control- "age thing" . Sleep apnea   sleep study years ago- has apnea but did not qualify for CPAP Past Surgical History: Past Surgical History: Procedure Laterality Date . CARDIAC CATHETERIZATION    1996/ 2012 report on chart . COLONOSCOPY   . INGUINAL HERNIA REPAIR  11/04/2011  Procedure: HERNIA REPAIR INGUINAL ADULT;  Surgeon: Pedro Earls, MD;  Location: [facility];  Service: General;  Laterality: Right;  Right Inguinal Hernia Repair with Mesh . KNEE ARTHROSCOPY Right 1976 . MENISCUS DEBRIDEMENT Left 1996 . TRIGGER FINGER RELEASE Right 10/10/2009 . UMBILICAL HERNIA REPAIR  2009 .  WRIST SURGERY Left 04/25/91 HPI: DURAND WITTMEYER is a 84 y.o. male who presented to [facility] ED (from home) 10/04/18 with cough and generalized worsening weakness over past 2 days, has been unable to eat or drink. PMH significant for dementia, pneumonia, GERD, CHF, CAD, HTN, hyperlipdemia, heart attack (1996, 2012), sleep apnea. CXR showed bibasilar atelectasis.  No data recorded Assessment / Plan / Recommendation CHL IP CLINICAL IMPRESSIONS 10/07/2018 Clinical Impression Mild oropharyngeal dysphagia during MBS in pt exhibiting increased increased lethargy, respiratory impairments and continuous and spontaneous cough during his entire time in the modified suite today (pre, during and post po). Dereased timing of swallow with bolus reaching pyriform sinuses  inconsistently before swallow initiated. Increased volume and flow using straw, in addition to deficits, resulted in trace penetration of thin (penetration x 1 with cup sip). Prolonged mastication and transit with mechanical soft due to decreased endurance. No compensatory strategies were directly needed and SLP recommends continuing thin liquids using caution to consume only when adequately alert/engaged, upright position, ensuring small sips with caregiver removing cup/straw when/if sip excessive. Continue Dys 3 texture, pills whole in applesauce. Pt has had persistent fever, lethargy. Prognosis for swallow to return to baseline is good once recovers from this illness.  SLP Visit Diagnosis Dysphagia, oropharyngeal phase (R13.12) Attention and concentration deficit following -- Frontal lobe and executive function deficit following -- Impact on safety and function Mild aspiration risk;Moderate aspiration risk   CHL IP TREATMENT RECOMMENDATION 10/07/2018 Treatment Recommendations Therapy as outlined in treatment plan below   Prognosis 10/07/2018 Prognosis for Safe Diet Advancement (No Data) Barriers to Reach Goals Cognitive deficits Barriers/Prognosis Comment -- CHL IP DIET RECOMMENDATION 10/07/2018 SLP Diet Recommendations Thin liquid;Dysphagia 3 (Mech soft) solids Liquid Administration via Cup Medication Administration Whole meds with puree Compensations Slow rate;Small sips/bites Postural Changes Seated upright at 90 degrees;Remain semi-upright after after feeds/meals (Comment)   CHL IP OTHER RECOMMENDATIONS 10/07/2018 Recommended Consults -- Oral Care Recommendations Oral care BID Other Recommendations --   CHL IP FOLLOW UP RECOMMENDATIONS 10/07/2018 Follow up Recommendations Skilled Nursing facility   [facility] IP FREQUENCY AND DURATION 10/07/2018 Speech Therapy Frequency (ACUTE ONLY) min 2x/week Treatment Duration 2 weeks      CHL IP ORAL PHASE 10/07/2018 Oral Phase Impaired Oral - Pudding Teaspoon -- Oral - Pudding  Cup -- Oral - Honey Teaspoon -- Oral - Honey Cup -- Oral - Nectar Teaspoon -- Oral - Nectar Cup Reduced posterior propulsion Oral - Nectar Straw -- Oral - Thin Teaspoon -- Oral - Thin Cup Lingual pumping;Reduced posterior propulsion Oral - Thin Straw -- Oral - Puree Lingual/palatal residue Oral - Mech Soft -- Oral - Regular (No Data) Oral - Multi-Consistency -- Oral - Pill -- Oral Phase - Comment --  CHL IP PHARYNGEAL PHASE 10/07/2018 Pharyngeal Phase Impaired Pharyngeal- Pudding Teaspoon -- Pharyngeal -- Pharyngeal- Pudding Cup -- Pharyngeal -- Pharyngeal- Honey Teaspoon -- Pharyngeal -- Pharyngeal- Honey Cup -- Pharyngeal -- Pharyngeal- Nectar Teaspoon -- Pharyngeal -- Pharyngeal- Nectar Cup Delayed swallow initiation-vallecula Pharyngeal -- Pharyngeal- Nectar Straw WFL Pharyngeal -- Pharyngeal- Thin Teaspoon -- Pharyngeal -- Pharyngeal- Thin Cup Delayed swallow initiation-vallecula;Delayed swallow initiation-pyriform sinuses Pharyngeal -- Pharyngeal- Thin Straw Penetration/Aspiration during swallow Pharyngeal Material enters airway, remains ABOVE vocal cords and not ejected out Pharyngeal- Puree WFL Pharyngeal -- Pharyngeal- Mechanical Soft WFL Pharyngeal -- Pharyngeal- Regular -- Pharyngeal -- Pharyngeal- Multi-consistency -- Pharyngeal -- Pharyngeal- Pill -- Pharyngeal -- Pharyngeal Comment --  CHL IP CERVICAL ESOPHAGEAL PHASE 10/07/2018 Cervical Esophageal Phase WFL Pudding Teaspoon --  Pudding Cup -- Honey Teaspoon -- Honey Cup -- Nectar Teaspoon -- Nectar Cup -- Nectar Straw -- Thin Teaspoon -- Thin Cup -- Thin Straw -- Puree -- Mechanical Soft -- Regular -- Multi-consistency -- Pill -- Cervical Esophageal Comment -- [name] 10/07/2018, 3:05 PM  Anthony [name].Ed Actor Pager [phone] Office [phone]                 Subjective: Patient is feeling better, continue to improve dyspnea, positive cough, no nausea or vomiting. Positive generalized weakness and  difficulty walking.   Discharge Exam: Vitals:   10/13/18 0800 10/13/18 0830  BP: 112/60 115/63  Pulse: 75 63  Resp:  14  Temp:    SpO2:  97%   Vitals:   10/13/18 0426 10/13/18 0500 10/13/18 0800 10/13/18 0830  BP: (!) 114/59  112/60 115/63  Pulse: 75  75 63  Resp: 18   14  Temp: (!) 97.5 F (36.4 C)     TempSrc: Oral     SpO2: 94%   97%  Weight:  81.5 kg    Height:        General: Not in pain or dyspnea, deconditioned  Neurology: Awake and alert, non focal  E ENT: mild pallor, no icterus, oral mucosa moist Cardiovascular: No JVD. S1-S2 present, rhythmic, no gallops, rubs, or murmurs. No lower extremity edema. Pulmonary: decreased breath sounds bilaterally, adequate air movement, no wheezing or rhonchi, scattered rales. Gastrointestinal. Abdomen with, no organomegaly, non tender, no rebound or guarding Skin. No rashes Musculoskeletal: no joint deformities   The results of significant diagnostics from this hospitalization (including imaging, microbiology, ancillary and laboratory) are listed below for reference.     Microbiology: Recent Results (from the past 240 hour(s))  Blood Culture (routine x 2)     Status: None   Collection Time: 10/04/18  3:16 PM  Result Value Ref Range Status   Specimen Description BLOOD RIGHT WRIST  Final   Special Requests   Final    BOTTLES DRAWN AEROBIC AND ANAEROBIC Blood Culture adequate volume   Culture   Final    NO GROWTH 5 DAYS Performed at [facility], [address], [place], [place] [address]    Report Status 10/09/2018 FINAL  Final  Group A Strep by PCR     Status: None   Collection Time: 10/04/18  3:31 PM  Result Value Ref Range Status   Group A Strep by PCR NOT DETECTED NOT DETECTED Final    Comment: Performed at [facility], [place] [address], [place], [place] [address]  Blood Culture (routine x 2)     Status: None   Collection Time: 10/04/18  4:36 PM  Result Value Ref Range Status   Specimen Description  BLOOD LEFT HAND  Final   Special Requests   Final    BOTTLES DRAWN AEROBIC AND ANAEROBIC Blood Culture adequate volume   Culture   Final    NO GROWTH 5 DAYS Performed at [facility], [place] [address], [place], [place] [address]    Report Status 10/09/2018 FINAL  Final  Urine culture     Status: None   Collection Time: 10/04/18  4:46 PM  Result Value Ref Range Status   Specimen Description URINE, CLEAN CATCH  Final   Special Requests NONE  Final   Culture   Final    NO GROWTH Performed at [facility], [place] [address] [place], [place] [address]  Report Status 10/05/2018 FINAL  Final  MRSA PCR Screening     Status: None   Collection Time: 10/07/18  3:07 PM  Result Value Ref Range Status   MRSA by PCR NEGATIVE NEGATIVE Final    Comment:        The GeneXpert MRSA Assay (FDA approved for NASAL specimens only), is one component of a comprehensive MRSA colonization surveillance program. It is not intended to diagnose MRSA infection nor to guide or monitor treatment for MRSA infections. Performed at [facility], [place] [address] [place], [place] [address]   Expectorated sputum assessment w rflx to resp cult     Status: None   Collection Time: 10/09/18  3:18 PM  Result Value Ref Range Status   Specimen Description SPUTUM  Final   Special Requests NONE  Final   Sputum evaluation   Final    Sputum specimen not acceptable for testing.  Please recollect.   CRITICAL RESULT CALLED TO, READ BACK BY AND VERIFIED WITH:  RN Mikle Bosworth 1817 [phone] FCP Performed at [facility], [place] [address], [place], [place] [address]    Report Status 10/09/2018 FINAL  Final  Respiratory Panel by PCR     Status: None   Collection Time: 10/10/18  3:45 PM  Result Value Ref Range Status   Adenovirus NOT DETECTED NOT DETECTED Final   Coronavirus 229E NOT DETECTED NOT DETECTED Final   Coronavirus HKU1 NOT DETECTED NOT DETECTED Final   Coronavirus NL63 NOT DETECTED NOT DETECTED  Final   Coronavirus OC43 NOT DETECTED NOT DETECTED Final   Metapneumovirus NOT DETECTED NOT DETECTED Final   Rhinovirus / Enterovirus NOT DETECTED NOT DETECTED Final   Influenza A NOT DETECTED NOT DETECTED Final   Influenza B NOT DETECTED NOT DETECTED Final   Parainfluenza Virus 1 NOT DETECTED NOT DETECTED Final   Parainfluenza Virus 2 NOT DETECTED NOT DETECTED Final   Parainfluenza Virus 3 NOT DETECTED NOT DETECTED Final   Parainfluenza Virus 4 NOT DETECTED NOT DETECTED Final   Respiratory Syncytial Virus NOT DETECTED NOT DETECTED Final   Bordetella pertussis NOT DETECTED NOT DETECTED Final   Chlamydophila pneumoniae NOT DETECTED NOT DETECTED Final   Mycoplasma pneumoniae NOT DETECTED NOT DETECTED Final    Comment: Performed at [facility], [place] [address], [place], [place] [address]  Culture, blood (routine x 2)     Status: None (Preliminary result)   Collection Time: 10/10/18  4:08 PM  Result Value Ref Range Status   Specimen Description BLOOD LEFT HAND  Final   Special Requests   Final    BOTTLES DRAWN AEROBIC ONLY Blood Culture adequate volume   Culture   Final    NO GROWTH 2 DAYS Performed at [facility], [place] [address] [place], [place] [address]    Report Status PENDING  Incomplete  Culture, blood (routine x 2)     Status: None (Preliminary result)   Collection Time: 10/10/18  4:20 PM  Result Value Ref Range Status   Specimen Description BLOOD RIGHT ANTECUBITAL  Final   Special Requests   Final    BOTTLES DRAWN AEROBIC ONLY Blood Culture adequate volume   Culture   Final    NO GROWTH 2 DAYS Performed at [facility], [place] [address] [place], [place] [address]    Report Status PENDING  Incomplete     Labs: BNP (last 3 results) No results for input(s): BNP in the last 8760 hours. Basic  Metabolic Panel: Recent Labs  Lab 10/10/18 0550 10/11/18 0415 10/12/18 0348  NA 132*  --  132*  K 3.6  --  3.9  CL 98  --  97*  CO2 25  --  25  GLUCOSE 163*   --  283*  BUN 24*  --  32*  CREATININE 1.16 1.09 1.06  CALCIUM 8.3*  --  8.6*  MG 1.9  --   --   PHOS 3.9  --   --    Liver Function Tests: Recent Labs  Lab 10/10/18 0550  ALBUMIN 2.2*   No results for input(s): LIPASE, AMYLASE in the last 168 hours. No results for input(s): AMMONIA in the last 168 hours. CBC: Recent Labs  Lab 10/07/18 0406 10/10/18 0550 10/12/18 0348  WBC 10.2 10.4 15.1*  NEUTROABS 8.2* 7.8*  --   HGB 11.8* 12.4* 12.1*  HCT 36.8* 38.7* 36.2*  MCV 94.4 93.5 92.6  PLT 245 278 262   Cardiac Enzymes: No results for input(s): CKTOTAL, CKMB, CKMBINDEX, TROPONINI in the last 168 hours. BNP: Invalid input(s): POCBNP CBG: No results for input(s): GLUCAP in the last 168 hours. D-Dimer No results for input(s): DDIMER in the last 72 hours. Hgb A1c No results for input(s): HGBA1C in the last 72 hours. Lipid Profile No results for input(s): CHOL, HDL, LDLCALC, TRIG, CHOLHDL, LDLDIRECT in the last 72 hours. Thyroid function studies No results for input(s): TSH, T4TOTAL, T3FREE, THYROIDAB in the last 72 hours.  Invalid input(s): FREET3 Anemia work up No results for input(s): VITAMINB12, FOLATE, FERRITIN, TIBC, IRON, RETICCTPCT in the last 72 hours. Urinalysis    Component Value Date/Time   COLORURINE YELLOW 10/04/2018 1646   APPEARANCEUR CLEAR 10/04/2018 1646   LABSPEC 1.004 (L) 10/04/2018 1646   PHURINE 7.0 10/04/2018 1646   GLUCOSEU NEGATIVE 10/04/2018 1646   HGBUR NEGATIVE 10/04/2018 1646   BILIRUBINUR NEGATIVE 10/04/2018 1646   KETONESUR NEGATIVE 10/04/2018 1646   PROTEINUR NEGATIVE 10/04/2018 1646   UROBILINOGEN 0.2 11/05/2014 0303   NITRITE NEGATIVE 10/04/2018 1646   LEUKOCYTESUR NEGATIVE 10/04/2018 1646   Sepsis Labs Invalid input(s): PROCALCITONIN,  WBC,  LACTICIDVEN Microbiology Recent Results (from the past 240 hour(s))  Blood Culture (routine x 2)     Status: None   Collection Time: 10/04/18  3:16 PM  Result Value Ref Range Status    Specimen Description BLOOD RIGHT WRIST  Final   Special Requests   Final    BOTTLES DRAWN AEROBIC AND ANAEROBIC Blood Culture adequate volume   Culture   Final    NO GROWTH 5 DAYS Performed at [facility], [place] [address], [place], [place] [address]    Report Status 10/09/2018 FINAL  Final  Group A Strep by PCR     Status: None   Collection Time: 10/04/18  3:31 PM  Result Value Ref Range Status   Group A Strep by PCR NOT DETECTED NOT DETECTED Final    Comment: Performed at [facility], [place] [address] [place], [place] [address]  Blood Culture (routine x 2)     Status: None   Collection Time: 10/04/18  4:36 PM  Result Value Ref Range Status   Specimen Description BLOOD LEFT HAND  Final   Special Requests   Final    BOTTLES DRAWN AEROBIC AND ANAEROBIC Blood Culture adequate volume   Culture   Final    NO GROWTH 5 DAYS Performed at [facility], [place] [address], [place], [place] [address]  Report Status 10/09/2018 FINAL  Final  Urine culture     Status: None   Collection Time: 10/04/18  4:46 PM  Result Value Ref Range Status   Specimen Description URINE, CLEAN CATCH  Final   Special Requests NONE  Final   Culture   Final    NO GROWTH Performed at [facility], [place] [address], [place], [place] [address]    Report Status 10/05/2018 FINAL  Final  MRSA PCR Screening     Status: None   Collection Time: 10/07/18  3:07 PM  Result Value Ref Range Status   MRSA by PCR NEGATIVE NEGATIVE Final    Comment:        The GeneXpert MRSA Assay (FDA approved for NASAL specimens only), is one component of a comprehensive MRSA colonization surveillance program. It is not intended to diagnose MRSA infection nor to guide or monitor treatment for MRSA infections. Performed at [facility], [place] [address] [place], [place] [address]   Expectorated sputum assessment w rflx to resp cult     Status: None   Collection Time: 10/09/18  3:18 PM  Result Value Ref Range  Status   Specimen Description SPUTUM  Final   Special Requests NONE  Final   Sputum evaluation   Final    Sputum specimen not acceptable for testing.  Please recollect.   CRITICAL RESULT CALLED TO, READ BACK BY AND VERIFIED WITH:  RN Mikle Bosworth [id number] [phone] FCP Performed at [facility], [place] [address], [place], [place] [address]    Report Status 10/09/2018 FINAL  Final  Respiratory Panel by PCR     Status: None   Collection Time: 10/10/18  3:45 PM  Result Value Ref Range Status   Adenovirus NOT DETECTED NOT DETECTED Final   Coronavirus 229E NOT DETECTED NOT DETECTED Final   Coronavirus HKU1 NOT DETECTED NOT DETECTED Final   Coronavirus NL63 NOT DETECTED NOT DETECTED Final   Coronavirus OC43 NOT DETECTED NOT DETECTED Final   Metapneumovirus NOT DETECTED NOT DETECTED Final   Rhinovirus / Enterovirus NOT DETECTED NOT DETECTED Final   Influenza A NOT DETECTED NOT DETECTED Final   Influenza B NOT DETECTED NOT DETECTED Final   Parainfluenza Virus 1 NOT DETECTED NOT DETECTED Final   Parainfluenza Virus 2 NOT DETECTED NOT DETECTED Final   Parainfluenza Virus 3 NOT DETECTED NOT DETECTED Final   Parainfluenza Virus 4 NOT DETECTED NOT DETECTED Final   Respiratory Syncytial Virus NOT DETECTED NOT DETECTED Final   Bordetella pertussis NOT DETECTED NOT DETECTED Final   Chlamydophila pneumoniae NOT DETECTED NOT DETECTED Final   Mycoplasma pneumoniae NOT DETECTED NOT DETECTED Final    Comment: Performed at [facility], [place] [address], [place], [place] [address]  Culture, blood (routine x 2)     Status: None (Preliminary result)   Collection Time: 10/10/18  4:08 PM  Result Value Ref Range Status   Specimen Description BLOOD LEFT HAND  Final   Special Requests   Final    BOTTLES DRAWN AEROBIC ONLY Blood Culture adequate volume   Culture   Final    NO GROWTH 2 DAYS Performed at [facility], [place] [address], [place], [place] [address]    Report Status PENDING  Incomplete   Culture, blood (routine x 2)     Status: None (Preliminary result)   Collection Time: 10/10/18  4:20 PM  Result Value Ref Range Status   Specimen Description BLOOD RIGHT ANTECUBITAL  Final   Special Requests   Final    BOTTLES DRAWN AEROBIC ONLY Blood Culture adequate volume   Culture   Final    NO GROWTH 2 DAYS Performed at [facility], [address], [place], [place] [address]    Report Status PENDING  Incomplete     Time coordinating discharge: 45 minutes  SIGNED:   Tawni Millers, MD  [facility] 10/13/2018, 9:12 AM Pager [phone]  If 7PM-7AM, please contact night-coverage [email or web address] Password [email or web address]

## 2018-10-13 NOTE — Clinical Social Work Placement (Signed)
   CLINICAL SOCIAL WORK PLACEMENT  NOTE  Date:  10/13/2018  Patient Details  Name: Anthony [name] MRN: [id number] Date of Birth: Feb 21, 1934  Clinical Social Work is seeking post-discharge placement for this patient at the [place] level of care (*CSW will initial, date and re-position this form in  chart as items are completed):  Yes   Patient/family provided with [facility] Work Department's list of facilities offering this level of care within the geographic area requested by the patient (or if unable, by the patient's family).  Yes   Patient/family informed of their freedom to choose among providers that offer the needed level of care, that participate in Medicare, Medicaid or managed care program needed by the patient, have an available bed and are willing to accept the patient.  Yes   Patient/family informed of [facility]'s ownership interest in [facility] and [facility], as well as of the fact that they are under no obligation to receive care at these facilities.  PASRR submitted to EDS on 10/11/18     PASRR number received on       Existing PASRR number confirmed on 10/11/18     FL2 transmitted to all facilities in geographic area requested by pt/family on 10/11/18     FL2 transmitted to all facilities within larger geographic area on       Patient informed that his/her managed care company has contracts with or will negotiate with certain facilities, including the following:        Yes   Patient/family informed of bed offers received.  Patient chooses bed at [facility] [place]     Physician recommends and patient chooses bed at      Patient to be transferred to [facility] on 10/13/18.  Patient to be transferred to facility by [facility]     Patient family notified on 10/13/18 of transfer.  Name of family member notified:  [name]     PHYSICIAN Please prepare prescriptions     Additional Comment:     _______________________________________________ Candie Chroman, LCSW 10/13/2018, 2:42 PM

## 2018-10-13 NOTE — Progress Notes (Signed)
Physical Therapy Treatment Patient Details Name: Anthony [name] MRN: [id number] DOB: 03/27/1934 Today's Date: 10/13/2018    History of Present Illness 84yo male with increased weakness and decreased intake. Diagnosed with sepsis. PMH RA, CHF, dysrhythmia, HTN, cardiac cath, knee scope, wrist surgery, dementia    PT Comments    Patient progressing slowly towards PT goals. Improved ambulation distance today with Min A for balance/safety. Max cues for RW proximity as well as for upright posture. Pt fatigues quickly requiring seated rest break. Sp02 ranged from mid 80s-90s on RA during activity. Cues for pursed lip breathing. Requires increased assist for standing and bed mobility today. Encouraged pt to sit in chair for all meals. Eager to get to rehab. Will follow.    Follow Up Recommendations  SNF     Equipment Recommendations  Other (comment)(defer to next venue)    Recommendations for Other Services       Precautions / Restrictions Precautions Precautions: Fall Precaution Comments: Rt lateral lean  Restrictions Weight Bearing Restrictions: No    Mobility  Bed Mobility Overal bed mobility: Needs Assistance Bed Mobility: Supine to Sit     Supine to sit: Max assist;HOB elevated     General bed mobility comments: Assis to elevate trunk, scoot hips to EOB, increased time, use of rail, HOB up  Transfers Overall transfer level: Needs assistance Equipment used: Rolling walker (2 wheeled) Transfers: Sit to/from Stand Sit to Stand: Mod assist         General transfer comment: assist to rise and steady, cues for upright posture. Increased knee flexion bilaterally. Transferred to chair post ambulation.  Ambulation/Gait Ambulation/Gait assistance: Min assist Gait Distance (Feet): 30 Feet(+20') Assistive device: Rolling walker (2 wheeled) Gait Pattern/deviations: Step-through pattern;Decreased step length - right;Decreased step length - left;Decreased stride  length;Decreased dorsiflexion - left;Decreased dorsiflexion - right;Shuffle;Trunk flexed;Narrow base of support Gait velocity: decreased    General Gait Details: very kyphotic posture and flexed at hips, shuffling gait pattern. Cues for upright posture and RW proxmity. 1 seated rest break. Sp02 ranged from mid 80s-90s on RA.   Stairs             Wheelchair Mobility    Modified Rankin (Stroke Patients Only)       Balance Overall balance assessment: Needs assistance Sitting-balance support: Feet supported;Bilateral upper extremity supported Sitting balance-Leahy Scale: Poor Sitting balance - Comments: Rt lateral lean and LOB wihtout UE support. Postural control: Right lateral lean Standing balance support: During functional activity;Bilateral upper extremity supported Standing balance-Leahy Scale: Poor Standing balance comment: heavy reliance on B UE support, flexed posture                            Cognition Arousal/Alertness: Awake/alert Behavior During Therapy: WFL for tasks assessed/performed Overall Cognitive Status: History of cognitive impairments - at baseline                                 General Comments: dementia at baseline, has short term memory deficits but otherwise behavior WNL and very pleasant       Exercises      General Comments General comments (skin integrity, edema, etc.): Daughter present during session.       Pertinent Vitals/Pain Pain Assessment: Faces Faces Pain Scale: No hurt    Home Living  Prior Function            PT Goals (current goals can now be found in the care plan section) Progress towards PT goals: Progressing toward goals    Frequency    Min 2X/week      PT Plan Current plan remains appropriate    Co-evaluation              AM-PAC PT "6 Clicks" Daily Activity  Outcome Measure  Difficulty turning over in bed (including adjusting bedclothes,  sheets and blankets)?: Unable Difficulty moving from lying on back to sitting on the side of the bed? : Unable Difficulty sitting down on and standing up from a chair with arms (e.g., wheelchair, bedside commode, etc,.)?: Unable Help needed moving to and from a bed to chair (including a wheelchair)?: A Little Help needed walking in hospital room?: A Lot Help needed climbing 3-5 steps with a railing? : Total 6 Click Score: 9    End of Session Equipment Utilized During Treatment: Gait belt Activity Tolerance: Patient limited by fatigue Patient left: in chair;with call bell/phone within reach;with family/visitor present Nurse Communication: Mobility status PT Visit Diagnosis: Unsteadiness on feet (R26.81);Muscle weakness (generalized) (M62.81);History of falling (Z91.81);Other abnormalities of gait and mobility (R26.89)     Time: 0814-4818 PT Time Calculation (min) (ACUTE ONLY): 24 min  Charges:  $Gait Training: 8-22 mins $Therapeutic Activity: 8-22 mins                     Wray Kearns, [place], DPT Acute Rehabilitation Services Pager [phone] Office [place] 10/13/2018, 12:31 PM

## 2018-10-15 DIAGNOSIS — I509 Heart failure, unspecified: Secondary | ICD-10-CM | POA: Diagnosis not present

## 2018-10-15 DIAGNOSIS — F0390 Unspecified dementia without behavioral disturbance: Secondary | ICD-10-CM | POA: Diagnosis not present

## 2018-10-15 DIAGNOSIS — J9691 Respiratory failure, unspecified with hypoxia: Secondary | ICD-10-CM | POA: Diagnosis not present

## 2018-10-15 DIAGNOSIS — J189 Pneumonia, unspecified organism: Secondary | ICD-10-CM | POA: Diagnosis not present

## 2018-10-15 DIAGNOSIS — R739 Hyperglycemia, unspecified: Secondary | ICD-10-CM | POA: Diagnosis not present

## 2018-10-15 LAB — CULTURE, BLOOD (ROUTINE X 2)
CULTURE: NO GROWTH
Culture: NO GROWTH
SPECIAL REQUESTS: ADEQUATE
Special Requests: ADEQUATE

## 2018-10-27 ENCOUNTER — Other Ambulatory Visit: Payer: Self-pay | Admitting: *Deleted

## 2018-10-27 NOTE — Patient Outreach (Signed)
Anthony [name] [facility]) Care Management  10/27/2018  Anthony [name] July 25, 1934 [id number]   Attended interdisciplinary team meeting at [facility] in [name] with [facility] team member Anthony [name] to discuss patient's progress and plan for transitioning to home. Patient was admitted to SNF on 10/13/18 after a hospitalization for pneumonia.  PT states patient works well with therapy but continues to need safety cues.  PT states patient has had 3 falls in the las week.  Nursing states patient is to wear 02 at 2lpm which he did not wear at home.  Discharge planner states patient's family does not agree on discharge plan.  One daughter who is the main caregiver feels patient and his spouse who both suffer from dementia should stay at a facility or ALF, but there have been no preparations towards this.  Discharge planner is working with daughter to assist with financial information and the process of choosing the best level of care for the patient.  At this the discharge plan is not clearly defined.   Went to bedside with Anthony [name] team member Anthony [name] to speak with patient.  Patient pleasant sitting in a wheelchair. Patient was not wearing 02 and when asked about it stated it bothered sometimes.  Patient stated he went by "Anthony [name]" not Anthony [name].  Patient unable to answer if he was doing well in therapy he did stat he was doing what he was told.  Patient able to make me aware his daughter Anthony [name] assisted him with his medications and transportation to doctor appointments.  Patient stated he had two daughters and son in laws who were very helpful to him.  Patient had to be cued about his spouse and he then remembered she lived in his home too.  Anthony [name] Management services and left a packet at the bedside for patient's daughter in case the patient's discharge plan is for him to go home.  Patient agreeable to services but with patient's memory issues will hold off on  referral until patient's discharge plan is set.   Plan to follow patient while in SNF or discharge plan is set.  Plan to see patient next week at facility site visit.   Anthony Limerick RN, BSN [facility] Acute Care Coordinator [phone]) Business Mobile [phone]) Toll free office

## 2018-10-30 DIAGNOSIS — J9691 Respiratory failure, unspecified with hypoxia: Secondary | ICD-10-CM | POA: Diagnosis not present

## 2018-11-01 ENCOUNTER — Institutional Professional Consult (permissible substitution): Payer: PPO | Admitting: Pulmonary Disease

## 2018-11-04 DIAGNOSIS — I5020 Unspecified systolic (congestive) heart failure: Secondary | ICD-10-CM | POA: Diagnosis not present

## 2018-11-04 DIAGNOSIS — J189 Pneumonia, unspecified organism: Secondary | ICD-10-CM | POA: Diagnosis not present

## 2018-11-04 DIAGNOSIS — D461 Refractory anemia with ring sideroblasts: Secondary | ICD-10-CM | POA: Diagnosis not present

## 2018-11-04 DIAGNOSIS — J9691 Respiratory failure, unspecified with hypoxia: Secondary | ICD-10-CM | POA: Diagnosis not present

## 2018-11-04 DIAGNOSIS — F0390 Unspecified dementia without behavioral disturbance: Secondary | ICD-10-CM | POA: Diagnosis not present

## 2018-11-08 ENCOUNTER — Encounter: Payer: Self-pay | Admitting: Pulmonary Disease

## 2018-11-08 ENCOUNTER — Ambulatory Visit (INDEPENDENT_AMBULATORY_CARE_PROVIDER_SITE_OTHER): Payer: PPO | Admitting: Pulmonary Disease

## 2018-11-08 VITALS — BP 98/60 | HR 72 | Ht 73.0 in

## 2018-11-08 DIAGNOSIS — K50119 Crohn's disease of large intestine with unspecified complications: Secondary | ICD-10-CM | POA: Diagnosis not present

## 2018-11-08 DIAGNOSIS — R918 Other nonspecific abnormal finding of lung field: Secondary | ICD-10-CM | POA: Diagnosis not present

## 2018-11-08 DIAGNOSIS — Z79899 Other long term (current) drug therapy: Secondary | ICD-10-CM | POA: Diagnosis not present

## 2018-11-08 DIAGNOSIS — Z79631 Long term (current) use of antimetabolite agent: Secondary | ICD-10-CM

## 2018-11-08 DIAGNOSIS — M069 Rheumatoid arthritis, unspecified: Secondary | ICD-10-CM

## 2018-11-08 NOTE — Progress Notes (Signed)
Synopsis: Referred in December 2019 for history of interstitial pneumonia by Josetta Huddle, MD  Subjective:   PATIENT ID: Anthony [name] GENDER: male DOB: [birth year], MRN: [id number]  Chief Complaint  Patient presents with  . Pulmonary Consult    Ref. by Dr. Jac Canavan lung damage due to Methotrexate.    Past medical history of rheumatoid arthritis was on methotrexate, recently stopped due to abnormalities on CT imaging.  History of Crohn's disease, GERD.  The patient was initially seen by Dr. Normajean Baxter in August 2015 here at [facility] pulmonary.  Patient was recently admitted to [facility].  CT imaging on admission revealed bilateral interstitial infiltrates and groundglass opacities.  He was treated for an acute interstitial pneumonia.  Was seen by infectious disease.  All antibiotics were stopped.  His methotrexate was stopped.  He was treated with IV steroids and then discharged on a tapering dose of steroids.  Currently taking 10 mg of prednisone daily.  His symptoms have improved.  He sees [facility] for Crohn's disease.  He was initially started on mesalamine but was unable to afford this.  At this point he has been taking oral prednisone.  There was a discussion for the possibility of mold exposures in his home.  He does not routinely follow with a rheumatologist.  His methotrexate has been managed by his PCP per the daughter.  When asked about this he thinks he has been on methotrexate for 25 to 30 years but he is unsure.  Overall, the daughter and the patient would prefer to do things as a conservative approach, with less invasive maneuvering.  At this point he has been improving on steroids.  With an empiric clinical diagnosis.  This is the first time he has been seen by [place] since 2015 because he felt follow-up after his initial appointment.  Patient denies hemoptysis.  Does feel weak on occasion.  He is usually transported with wheelchair.   Past Medical History:  Diagnosis  Date  . Arthritis    rheumatoid-  Dr Barkley Boards  . CHF (congestive heart failure) (HCC)    chronic systolic heart failure per Dr [name] LOV note 09/23/11.   EKG 4/12, stress test  and cath from 8/12 on chart.  Clearance with LOV Dr [name] on chart  . Coronary artery disease   . Crohn disease ([place])   . Dysrhythmia    nonsustained,asymptomatic VT per Dr [name] office note  resulting in cath  . GERD (gastroesophageal reflux disease)   . Heart attack ([place]) 1996, 06/2011  . Hyperlipidemia   . Hypertension   . Pneumonia 1993  . Prostate troubles    states take alternating meds for bladder/prostate control- "age thing"  . Sleep apnea    sleep study years ago- has apnea but did not qualify for CPAP     Family History  Problem Relation Age of Onset  . Heart disease Mother   . Rheum arthritis Mother   . Emphysema Father      Past Surgical History:  Procedure Laterality Date  . CARDIAC CATHETERIZATION     1996/ 2012 report on chart  . COLONOSCOPY    . INGUINAL HERNIA REPAIR  11/04/2011   Procedure: HERNIA REPAIR INGUINAL ADULT;  Surgeon: Pedro Earls, MD;  Location: [facility];  Service: General;  Laterality: Right;  Right Inguinal Hernia Repair with Mesh  . KNEE ARTHROSCOPY Right 1976  . MENISCUS DEBRIDEMENT Left 1996  . TRIGGER FINGER RELEASE Right 10/10/2009  . UMBILICAL HERNIA  REPAIR  2009  . WRIST SURGERY Left 04/25/91    Social History   Socioeconomic History  . Marital status: Married    Spouse name: Not on file  . Number of children: Not on file  . Years of education: Not on file  . Highest education level: Not on file  Occupational History  . Not on file  Social Needs  . Financial resource strain: Not on file  . Food insecurity:    Worry: Not on file    Inability: Not on file  . Transportation needs:    Medical: Not on file    Non-medical: Not on file  Tobacco Use  . Smoking status: Former Smoker    Packs/day: 3.00    Years: 20.00    Pack years: 60.00    Types:  Cigarettes    Last attempt to quit: 11/01/1970    Years since quitting: 48.0  . Smokeless tobacco: Never Used  . Tobacco comment: quit 1967  Substance and Sexual Activity  . Alcohol use: No  . Drug use: No  . Sexual activity: Not on file  Lifestyle  . Physical activity:    Days per week: Not on file    Minutes per session: Not on file  . Stress: Not on file  Relationships  . Social connections:    Talks on phone: Not on file    Gets together: Not on file    Attends religious service: Not on file    Active member of club or organization: Not on file    Attends meetings of clubs or organizations: Not on file    Relationship status: Not on file  . Intimate partner violence:    Fear of current or ex partner: Not on file    Emotionally abused: Not on file    Physically abused: Not on file    Forced sexual activity: Not on file  Other Topics Concern  . Not on file  Social History Narrative  . Not on file     No Known Allergies   Outpatient Medications Prior to Visit  Medication Sig Dispense Refill  . acetaminophen (TYLENOL) 500 MG tablet Take 500 mg by mouth 2 (two) times daily.    Marland Kitchen CALCIUM-VITAMIN D PO Take 1 tablet by mouth every evening.     . carvedilol (COREG) 6.25 MG tablet TAKE 1 TABLET TWICE A DAY WITH A MEAL. (Patient taking differently: Take 6.25 mg by mouth 2 (two) times daily with a meal. ) 180 tablet 3  . Cholecalciferol (VITAMIN D3) 50 MCG (2000 UT) capsule Take 2,000 Units by mouth every morning.    . Coenzyme Q10 (CO Q-10) 100 MG CAPS Take 100 mg by mouth every evening.     . donepezil (ARICEPT) 5 MG tablet Take 5 mg by mouth at bedtime.  5  . famotidine (PEPCID) 20 MG tablet Take 40 mg by mouth at bedtime.    . feeding supplement, ENSURE ENLIVE, (ENSURE ENLIVE) LIQD Take 237 mLs by mouth 2 (two) times daily between meals. 237 mL 12  . FLUoxetine (PROZAC) 20 MG capsule Take 20 mg by mouth every morning.     . folic acid (FOLVITE) 086 MCG tablet Take 400 mcg by  mouth every evening.     . insulin aspart (NOVOLOG) 100 UNIT/ML injection Inject into the skin. As directed    . insulin glargine (LANTUS) 100 UNIT/ML injection Inject 25 Units into the skin daily.    . magnesium oxide (MAG-OX) 400 MG  tablet Take 200 mg by mouth daily.     Marland Kitchen oxybutynin (DITROPAN) 5 MG tablet Take 5 mg by mouth 2 (two) times daily.  1  . pravastatin (PRAVACHOL) 40 MG tablet Take 1 tablet (40 mg total) by mouth at bedtime. 30 tablet 11  . predniSONE (DELTASONE) 20 MG tablet Take 3 tablets (60 mg total) by mouth daily with breakfast. (Patient taking differently: Take 20 mg by mouth daily with breakfast. ) 90 tablet 0  . tamsulosin (FLOMAX) 0.4 MG CAPS capsule Take 0.4 mg by mouth daily after supper.   11  . traMADol (ULTRAM) 50 MG tablet Take 50 mg by mouth every 6 (six) hours as needed.    . vitamin A 10000 UNIT capsule Take 10,000 Units by mouth every evening.     . vitamin E 400 UNIT capsule Take 400 Units by mouth every evening.     . Cyanocobalamin (B-12) 500 MCG TABS Take 500 mcg by mouth every evening.    Marland Kitchen guaiFENesin-dextromethorphan (ROBITUSSIN DM) 100-10 MG/5ML syrup Take 5 mLs by mouth every 4 (four) hours as needed for cough. (Patient not taking: Reported on 11/08/2018) 118 mL 0  . loperamide (IMODIUM) 2 MG capsule Take 2 mg by mouth every evening.    . mesalamine (LIALDA) 1.2 G EC tablet Take 4.8 g by mouth daily with breakfast.     . nitroGLYCERIN (NITROSTAT) 0.4 MG SL tablet Place 0.4 mg under the tongue every 5 (five) minutes as needed. Chest pain      No facility-administered medications prior to visit.     Review of Systems  Constitutional: Positive for malaise/fatigue. Negative for chills, fever and weight loss.  HENT: Negative for hearing loss, sore throat and tinnitus.   Eyes: Negative for blurred vision and double vision.  Respiratory: Negative for cough, hemoptysis, sputum production, shortness of breath, wheezing and stridor.   Cardiovascular: Negative  for chest pain, palpitations, orthopnea, leg swelling and PND.  Gastrointestinal: Negative for abdominal pain, constipation, diarrhea, heartburn, nausea and vomiting.  Genitourinary: Negative for dysuria, hematuria and urgency.  Musculoskeletal: Negative for joint pain and myalgias.  Skin: Negative for itching and rash.  Neurological: Positive for weakness. Negative for dizziness, tingling and headaches.  Endo/Heme/Allergies: Negative for environmental allergies. Does not bruise/bleed easily.  Psychiatric/Behavioral: Negative for depression. The patient is not nervous/anxious and does not have insomnia.   All other systems reviewed and are negative.    Objective:  Physical Exam Vitals signs reviewed.  Constitutional:      General: He is not in acute distress.    Appearance: He is well-developed.  HENT:     Head: Normocephalic and atraumatic.  Eyes:     General: No scleral icterus.    Conjunctiva/sclera: Conjunctivae normal.     Pupils: Pupils are equal, round, and reactive to light.  Neck:     Musculoskeletal: Neck supple.     Vascular: No JVD.     Trachea: No tracheal deviation.  Cardiovascular:     Rate and Rhythm: Normal rate and regular rhythm.     Heart sounds: Normal heart sounds. No murmur.  Pulmonary:     Effort: Pulmonary effort is normal. No tachypnea, accessory muscle usage or respiratory distress.     Breath sounds: Normal breath sounds. No stridor. No wheezing, rhonchi or rales.     Comments: No crackles Abdominal:     General: Bowel sounds are normal. There is no distension.     Palpations: Abdomen is soft.  Tenderness: There is no abdominal tenderness.  Musculoskeletal:        General: No swelling or tenderness.     Comments: Enlarged knuckles and joint spaces of the hands  Lymphadenopathy:     Cervical: No cervical adenopathy.  Skin:    General: Skin is warm and dry.     Capillary Refill: Capillary refill takes less than 2 seconds.     Findings: No  rash.  Neurological:     Mental Status: He is alert and oriented to person, place, and time.  Psychiatric:        Behavior: Behavior normal.      Vitals:   11/08/18 1159  BP: 98/60  Pulse: 72  SpO2: 100%  Height: 6' 1"  (1.854 m)   100% on RA BMI Readings from Last 3 Encounters:  11/08/18 23.70 kg/m  10/13/18 23.70 kg/m  04/07/18 23.59 kg/m   Wt Readings from Last 3 Encounters:  10/13/18 179 lb 9.6 oz (81.5 kg)  04/07/18 173 lb 15.1 oz (78.9 kg)  02/22/18 183 lb 1.9 oz (83.1 kg)     CBC    Component Value Date/Time   WBC 15.1 (H) 10/12/2018 0348   RBC 3.91 (L) 10/12/2018 0348   HGB 12.1 (L) 10/12/2018 0348   HCT 36.2 (L) 10/12/2018 0348   PLT 262 10/12/2018 0348   MCV 92.6 10/12/2018 0348   MCH 30.9 10/12/2018 0348   MCHC 33.4 10/12/2018 0348   RDW 15.2 10/12/2018 0348   LYMPHSABS 1.4 10/10/2018 0550   MONOABS 0.7 10/10/2018 0550   EOSABS 0.4 10/10/2018 0550   BASOSABS 0.0 10/10/2018 0550     Chest Imaging: November 2019 CT chest: Bilateral interstitial pneumonia, upper lobe areas of groundglass The patient's images have been independently reviewed by me.    Pulmonary Functions Testing Results: No flowsheet data found.  FeNO: None   Pathology: None  Echocardiogram:  2015 ejection fraction 45%  Heart Catheterization: None     Assessment & Plan:   Pulmonary infiltrates - Plan: DG Chest 2 View  On methotrexate therapy  Rheumatoid arthritis involving both hands, unspecified rheumatoid factor presence (HCC)  Crohn's colitis, unspecified complication ([place])  Discussion: This is a 84 year old gentleman with a past medical history of rheumatoid arthritis for the past 25 to 30 years on methotrexate.  Had an episode of interstitial pneumonia treated in 2015 with a tapering dose of steroids and cessation of his methotrexate.  He was restarted on his methotrexate by his primary care provider with no follow-up to pulmonary.  Presents again in November  2019 to the hospital with significant oxygen requirement and bilateral groundglass opacities on chest imaging.  Methotrexate was again held and patient was started on prednisone.  No bronchoscopy was completed on either exacerbation.  I am unclear as to the etiology of the groundglass opacities seen on chest imaging both times.  I have reviewed both CT images.  This time was much more extensive than the first event in 2015.  He has since completed a prednisone taper down to 10 mg a day at this point.  He has no respiratory complaints.  The family and patient do not want aggressive measures.  I discussed utility of bronchoscopy at this time.  I do not believe there is much help in obtaining the diagnosis currently as he has no symptoms and has improved with a tapering dose of prednisone.  The differential diagnosis remains vast.  I do not suspect he has an infectious etiology as he  has improved with steroid taper.  These infiltrates could very well be related to RA or his underlying Crohn's.  The low-dose prednisone that he is at on currently is likely keeping the Crohn's and RA symptoms at [place].  We will recommend the following: Patient to follow-up with his PCP regarding his other medical problems. Would recommend total cessation of the methotrexate at this point. If symptoms were to recur we could rediscuss the utility of bronchoscopy. Agree with slow tapering of prednisone until he is completely off.  This is already been started by his PCP. He can follow-up with [place] in 3 months and have a repeat 2 view chest x-ray We discussed the utility of having repeat axial imaging of the chest with CT.  However due to the patient and family's reluctance of pursuing any aggressive intervention such as bronchoscopy I think there is little utility in repeating axial CT imaging if we are not going to act upon the findings.  The family and patient are agreeable to this plan.  Return to clinic in 3 months  Greater  than 50% of this patient 60-minute office visit was dedicated to discussing the above recommended diagnostic and treatment plan as outlined.   Current Outpatient Medications:  .  acetaminophen (TYLENOL) 500 MG tablet, Take 500 mg by mouth 2 (two) times daily., Disp: , Rfl:  .  CALCIUM-VITAMIN D PO, Take 1 tablet by mouth every evening. , Disp: , Rfl:  .  carvedilol (COREG) 6.25 MG tablet, TAKE 1 TABLET TWICE A DAY WITH A MEAL. (Patient taking differently: Take 6.25 mg by mouth 2 (two) times daily with a meal. ), Disp: 180 tablet, Rfl: 3 .  Cholecalciferol (VITAMIN D3) 50 MCG (2000 UT) capsule, Take 2,000 Units by mouth every morning., Disp: , Rfl:  .  Coenzyme Q10 (CO Q-10) 100 MG CAPS, Take 100 mg by mouth every evening. , Disp: , Rfl:  .  donepezil (ARICEPT) 5 MG tablet, Take 5 mg by mouth at bedtime., Disp: , Rfl: 5 .  famotidine (PEPCID) 20 MG tablet, Take 40 mg by mouth at bedtime., Disp: , Rfl:  .  feeding supplement, ENSURE ENLIVE, (ENSURE ENLIVE) LIQD, Take 237 mLs by mouth 2 (two) times daily between meals., Disp: 237 mL, Rfl: 12 .  FLUoxetine (PROZAC) 20 MG capsule, Take 20 mg by mouth every morning. , Disp: , Rfl:  .  folic acid (FOLVITE) 381 MCG tablet, Take 400 mcg by mouth every evening. , Disp: , Rfl:  .  insulin aspart (NOVOLOG) 100 UNIT/ML injection, Inject into the skin. As directed, Disp: , Rfl:  .  insulin glargine (LANTUS) 100 UNIT/ML injection, Inject 25 Units into the skin daily., Disp: , Rfl:  .  magnesium oxide (MAG-OX) 400 MG tablet, Take 200 mg by mouth daily. , Disp: , Rfl:  .  oxybutynin (DITROPAN) 5 MG tablet, Take 5 mg by mouth 2 (two) times daily., Disp: , Rfl: 1 .  pravastatin (PRAVACHOL) 40 MG tablet, Take 1 tablet (40 mg total) by mouth at bedtime., Disp: 30 tablet, Rfl: 11 .  predniSONE (DELTASONE) 20 MG tablet, Take 3 tablets (60 mg total) by mouth daily with breakfast. (Patient taking differently: Take 20 mg by mouth daily with breakfast. ), Disp: 90 tablet,  Rfl: 0 .  tamsulosin (FLOMAX) 0.4 MG CAPS capsule, Take 0.4 mg by mouth daily after supper. , Disp: , Rfl: 11 .  traMADol (ULTRAM) 50 MG tablet, Take 50 mg by mouth every 6 (six) hours  as needed., Disp: , Rfl:  .  vitamin A 10000 UNIT capsule, Take 10,000 Units by mouth every evening. , Disp: , Rfl:  .  vitamin E 400 UNIT capsule, Take 400 Units by mouth every evening. , Disp: , Rfl:  .  Cyanocobalamin (B-12) 500 MCG TABS, Take 500 mcg by mouth every evening., Disp: , Rfl:  .  guaiFENesin-dextromethorphan (ROBITUSSIN DM) 100-10 MG/5ML syrup, Take 5 mLs by mouth every 4 (four) hours as needed for cough. (Patient not taking: Reported on 11/08/2018), Disp: 118 mL, Rfl: 0 .  loperamide (IMODIUM) 2 MG capsule, Take 2 mg by mouth every evening., Disp: , Rfl:  .  mesalamine (LIALDA) 1.2 G EC tablet, Take 4.8 g by mouth daily with breakfast. , Disp: , Rfl:  .  nitroGLYCERIN (NITROSTAT) 0.4 MG SL tablet, Place 0.4 mg under the tongue every 5 (five) minutes as needed. Chest pain , Disp: , Rfl:    Garner Nash, DO [facility] 11/08/2018 1:16 PM

## 2018-11-08 NOTE — Patient Instructions (Addendum)
Thank you for visiting Dr. Valeta Harms at [facility]. Today we recommend the following: Orders Placed This Encounter  Procedures  . DG Chest 2 View   At your next visit please go to [facility] location for your chest x ray.   [place] Go to the basement and give them your name and DOB.   Return in about 3 months (around 02/07/2019).

## 2018-11-09 ENCOUNTER — Other Ambulatory Visit: Payer: Self-pay | Admitting: *Deleted

## 2018-11-09 NOTE — Patient Outreach (Signed)
[facility]) Care Management  11/09/2018  Anthony [name] October 27, 1934 [id number]   Transition of Care Referral   Referral Date: 11/08/18  Referral Source: HTA IP discharge Date of Admission:  Diagnosis:  Date of Discharge:on 11/05/18 Facility: [place]: HTA    Outreach attempt # 1 to the home number  [facility] RN CM spoke with his wife, Anthony [name] who informed [facility] RN CM that after the d/c on 11/05/18 Mr Locklin went home to stay with her daughter, Anthony [name], The wife requests CM call Anthony [name] and confirmed the listed contact number in [place] as correct She states that Anthony [name] took the pt to a recent MD appointment but it was tasking and states the daughter would welcome any assistance  Outreach attempt # 2 on 11/09/18 to daughter Anthony [name] [phone]  No answer. [facility] RN CM left HIPAA compliant voicemail message along with CM's contact info.   Plan: [facility] RN CM sent an unsuccessful outreach letter and scheduled this patient for another call attempt within 4 business days   Anthony Yohn L. Lavina Hamman, RN, BSN, Friend Management Care Coordinator Direct Number [phone] Mobile number [phone]  Main [facility] number [phone] Fax number [phone]

## 2018-11-10 ENCOUNTER — Other Ambulatory Visit: Payer: Self-pay | Admitting: *Deleted

## 2018-11-10 NOTE — Patient Outreach (Signed)
[facility]) Care Management  11/10/2018  ADRIANN BALLWEG [birth year] [id number]   Transition of Care Referral  Referral Date: 11/08/18  Referral Source: HTA IP discharge Date of Admission: 10/13/18 Diagnosis: hospitalized for pneumonia  Date of Discharge:on 11/05/18 Facility: [place],  Insurance: HTA    Patient's daughter Thurman Coyer returned a call to [facility] RN CM [name] is able to verify HIPAA Information provided by his daughter Thurman Coyer as patient has dementia Reviewed and addressed  Transition of care referral to [facility] with amy   Today [name] voices the main concern today is getting home health services started or Mr Gehring at her home in [place]  She reports he had previously been with [facility] and [facility] had been consulted. Ebony Hail at [facility] was working on these services since 11/08/18 d/c  [facility] RN CM called to speak with Ebony Hail, discharge planner  at [facility]. CM left a message at [phone]  Social: [name] confirms that Mr Dohrmann is now permanently living with her and her husband Patent examiner) in [place]. She reports that Mrs Julyan Gales is not able to provide care for Mr Christmas at this time related to her own medical issues to include memory issues and Stress levels. [name] has reported that Mr Leahy has been less stressed and shown less signs of depression while at her home.  [name] reports Mr Chohan is on Prozac He also has his daughter [name] who lives in [place]  She reports Mr Bertoli is needing assist with all his ADL and iADL care. [name] and dawn assist with medications and transportation to medical appointments. [name] does show interest during this call in finding out more about other transportation resources. [name] assists by reviewing the mail that is still sent to Mr Lasecki home address   Conditions: PNA, MI, HTN, dementia on Aricept, Combined systolic and diastolic HF, rheumatoid arthritis, ulcerative colitis,  aortic atherosclerosis, syncope, paroxysmal ventricular tachycardia, BPH, HLD, difficulty walking, h/o right inguinal hernia repair, mild sleep apnea, postinflammatory pulmonary fibrosis, hypoxia, GERD, colonic polyps, Crohn's disease of large bowel, acute pancreatitis,   Falls 3 falls at snf, 1 fall before hospitalization, ? Number of falls prior to hospitalization  DME has bedside commode, walkers x 2 (rolling and standard) prefers not to use rolling walker as much related to his safety issues He has a w/c, a gait belt, toilet grab bars, and a shower seat. [name] husband is working on Editor, commissioning. [facility] to get DME supplies.  Cm discussed use of scales to manage fluid/wt changes for CHF [name] voiced understanding There is not a CPAP nor BIPAP - CM discussed DME sometimes used for sleep apnea if ordered   Medications: [name] states Mr Bottino has multiple medications and denies needs except mentions that Mr Quast was ordered mesalamine by GI MD, Dr Arta Silence, and he has not been able to afford so he was given prednisone (that is now being tampered) instead and has been assisted previously with samples from MD Prefers not to get flu shot at this time related to low immune system of rheumatoid arthritis Last obtained in 2014. [name] states "we generally keep him inside and does not go out in the public a lot."   Appointments: Mr Mohabir was seen by pulmonologist, Icard on 11/08/18  He is to be seen by Dr Inda Merlin on 11/21/18  [name] reports they are considering changing to a [place] MD closer  to [place] related to travel/distance CM and [name] discussed in [place] providers Mr Desmith has not been seen by a neurologist but his dementia has been managed by the primary MD, Inda Merlin CM and [name] discussed neurology services  Advance Directives: [name] has POA There is no living will but [name] reports Mr Francisco has shared with Mrs Sobotka his wishes if something were to  happen to him. [name] states Mr Bensinger is "DNR"  Consent: [facility] RN CM reviewed [facility] services with [name]. [name] gave verbal consent for services for [facility] but denied at this time need for [facility] RN CM. She reports they are aware of education and home care for HTN, CHF and rheumatoid arthritis  [name] to consult with [name] about possible need for pharmacy referral for possible assist with mesalamine  Cm reviewed care giver respite, socialization and fall risk resources and services are available   Plan [facility] RN CM will refer Mr Welcher to [facility] to offer [name], daughter, possible community resources in the [place] area for socialization, PHQ-2 =2, PHQ-9= 6 and alternative transportation resources for [place] area  [facility] RN CM will contact [facility] to follow up and assist with status of home health services  CM will follow up with [name],daughter with in the next 4 business days and plan for case closure as needed Routed note to listed Epic care team MDs with PHQ-2 & 9 scores    Lydia Toren L. Lavina Hamman, RN, BSN, [place] Coordinator Office number [phone] Mobile number [phone]  Main [facility] number [phone] Fax number [phone]

## 2018-11-11 DIAGNOSIS — I2510 Atherosclerotic heart disease of native coronary artery without angina pectoris: Secondary | ICD-10-CM | POA: Diagnosis not present

## 2018-11-11 DIAGNOSIS — F0390 Unspecified dementia without behavioral disturbance: Secondary | ICD-10-CM | POA: Diagnosis not present

## 2018-11-11 DIAGNOSIS — I110 Hypertensive heart disease with heart failure: Secondary | ICD-10-CM | POA: Diagnosis not present

## 2018-11-11 DIAGNOSIS — I5022 Chronic systolic (congestive) heart failure: Secondary | ICD-10-CM | POA: Diagnosis not present

## 2018-11-11 DIAGNOSIS — M069 Rheumatoid arthritis, unspecified: Secondary | ICD-10-CM | POA: Diagnosis not present

## 2018-11-18 ENCOUNTER — Other Ambulatory Visit: Payer: Self-pay

## 2018-11-18 ENCOUNTER — Other Ambulatory Visit: Payer: Self-pay | Admitting: *Deleted

## 2018-11-18 NOTE — Patient Outreach (Signed)
[facility]) Care Management  11/18/2018  Anthony [name] [birth year] [id number]  BSW attempted to contact the patient on today's date to conduct a community resource consult. Unfortunately, today's call was unsuccessful. BSW left the patient a HIPAA compliant voice message requesting a return call.   Plan: BSW will mail the patient an unsuccessful outreach letter. BSW will attempt the patient again within the next four business days.  Daneen Schick, BSW, CDP [facility] [phone]

## 2018-11-18 NOTE — Patient Outreach (Signed)
[facility]) Care Management  11/18/2018  ODIES DESA 02-Mar-1934 [id number]  Returned call from Thurman Coyer to this BSW, patient HIPAA identifiers confirmed. BSW introduced self to Mrs. Andress and discussed patient recent referral for transportation resources as well as socialization. Mrs. Andress stated "I've got all that covered now so you can scratch [place] from your list". Mrs. Andress further states the patient is active with [facility]. Mrs. Andress has also identified family members who are "nurse's and CNA's" who have agreed to assist with patient care if Mrs. Andress and/or her husband are ever unavailable.  Daneen Schick, BSW, CDP [facility] [phone]

## 2018-11-18 NOTE — Patient Outreach (Signed)
[facility]) Care Management  11/18/2018  [name] 12/26/1933 [id number]   Care coordination and case closure   [facility] [place] consulted with [facility] RN CM on the outreach to pt and daughter, Anthony [name]. Anthony [name] denies needs from [facility] SW for transportation or socialization resources Anthony [name] informed [facility] SW of [place] services  [facility] RN CM called and spoke with Anthony [name], daughter   She is able to verify HIPAA  Consent: [facility] RN CM reviewed [facility] services with patient. Patient gave verbal consent for services.  Anthony [name] confirms with [facility] RN CM that Mr Siemen is doing well She reports Alvis Lemmings has indeed visited Mr Deshotel She reports [facility] PT had the last visit and Mr Perko will continue to be seen by [facility] PT, Simona Huh and an interest in an aide has been discussed  Plan [facility] RN CM will close case at this time as patient has been assessed and no needs identified.   [facility] RN CM again provided [facility] RN CM's contact number. Anthony [name] was commended for assisting with care of Mr Laura and encouraged to return a call to [facility] RN CM prn  Joelene Millin L. Lavina Hamman, RN, BSN, [place] Coordinator Office number [phone] Mobile number [phone]  Main [facility] number [phone] Fax number [phone]

## 2018-11-21 DIAGNOSIS — I5020 Unspecified systolic (congestive) heart failure: Secondary | ICD-10-CM | POA: Diagnosis not present

## 2018-11-21 DIAGNOSIS — I25119 Atherosclerotic heart disease of native coronary artery with unspecified angina pectoris: Secondary | ICD-10-CM | POA: Diagnosis not present

## 2018-11-21 DIAGNOSIS — I700 Atherosclerosis of aorta: Secondary | ICD-10-CM | POA: Diagnosis not present

## 2018-11-21 DIAGNOSIS — F322 Major depressive disorder, single episode, severe without psychotic features: Secondary | ICD-10-CM | POA: Diagnosis not present

## 2018-11-21 DIAGNOSIS — J189 Pneumonia, unspecified organism: Secondary | ICD-10-CM | POA: Diagnosis not present

## 2018-11-21 DIAGNOSIS — F0390 Unspecified dementia without behavioral disturbance: Secondary | ICD-10-CM | POA: Diagnosis not present

## 2018-11-21 DIAGNOSIS — D461 Refractory anemia with ring sideroblasts: Secondary | ICD-10-CM | POA: Diagnosis not present

## 2018-11-21 DIAGNOSIS — R739 Hyperglycemia, unspecified: Secondary | ICD-10-CM | POA: Diagnosis not present

## 2018-11-21 DIAGNOSIS — E7800 Pure hypercholesterolemia, unspecified: Secondary | ICD-10-CM | POA: Diagnosis not present

## 2018-11-21 DIAGNOSIS — J8489 Other specified interstitial pulmonary diseases: Secondary | ICD-10-CM | POA: Diagnosis not present

## 2018-11-21 DIAGNOSIS — E559 Vitamin D deficiency, unspecified: Secondary | ICD-10-CM | POA: Diagnosis not present

## 2018-11-21 DIAGNOSIS — K50119 Crohn's disease of large intestine with unspecified complications: Secondary | ICD-10-CM | POA: Diagnosis not present

## 2018-11-22 DIAGNOSIS — I110 Hypertensive heart disease with heart failure: Secondary | ICD-10-CM | POA: Diagnosis not present

## 2018-11-22 DIAGNOSIS — I5022 Chronic systolic (congestive) heart failure: Secondary | ICD-10-CM | POA: Diagnosis not present

## 2018-11-22 DIAGNOSIS — M069 Rheumatoid arthritis, unspecified: Secondary | ICD-10-CM | POA: Diagnosis not present

## 2018-11-22 DIAGNOSIS — F0390 Unspecified dementia without behavioral disturbance: Secondary | ICD-10-CM | POA: Diagnosis not present

## 2018-11-22 DIAGNOSIS — I2510 Atherosclerotic heart disease of native coronary artery without angina pectoris: Secondary | ICD-10-CM | POA: Diagnosis not present

## 2018-11-24 ENCOUNTER — Ambulatory Visit: Payer: PPO

## 2018-11-25 DIAGNOSIS — I110 Hypertensive heart disease with heart failure: Secondary | ICD-10-CM | POA: Diagnosis not present

## 2018-11-25 DIAGNOSIS — I2510 Atherosclerotic heart disease of native coronary artery without angina pectoris: Secondary | ICD-10-CM | POA: Diagnosis not present

## 2018-11-25 DIAGNOSIS — F0390 Unspecified dementia without behavioral disturbance: Secondary | ICD-10-CM | POA: Diagnosis not present

## 2018-11-25 DIAGNOSIS — M069 Rheumatoid arthritis, unspecified: Secondary | ICD-10-CM | POA: Diagnosis not present

## 2018-11-25 DIAGNOSIS — I5022 Chronic systolic (congestive) heart failure: Secondary | ICD-10-CM | POA: Diagnosis not present

## 2018-12-04 DIAGNOSIS — F0390 Unspecified dementia without behavioral disturbance: Secondary | ICD-10-CM | POA: Diagnosis not present

## 2018-12-04 DIAGNOSIS — I5022 Chronic systolic (congestive) heart failure: Secondary | ICD-10-CM | POA: Diagnosis not present

## 2018-12-04 DIAGNOSIS — R52 Pain, unspecified: Secondary | ICD-10-CM | POA: Diagnosis not present

## 2018-12-04 DIAGNOSIS — Z9981 Dependence on supplemental oxygen: Secondary | ICD-10-CM | POA: Diagnosis not present

## 2018-12-04 DIAGNOSIS — B370 Candidal stomatitis: Secondary | ICD-10-CM | POA: Diagnosis not present

## 2018-12-04 DIAGNOSIS — N3281 Overactive bladder: Secondary | ICD-10-CM | POA: Diagnosis not present

## 2018-12-04 DIAGNOSIS — N400 Enlarged prostate without lower urinary tract symptoms: Secondary | ICD-10-CM | POA: Diagnosis not present

## 2018-12-04 DIAGNOSIS — M069 Rheumatoid arthritis, unspecified: Secondary | ICD-10-CM | POA: Diagnosis not present

## 2018-12-04 DIAGNOSIS — J8489 Other specified interstitial pulmonary diseases: Secondary | ICD-10-CM | POA: Diagnosis not present

## 2018-12-04 DIAGNOSIS — R0902 Hypoxemia: Secondary | ICD-10-CM | POA: Diagnosis not present

## 2018-12-04 DIAGNOSIS — A419 Sepsis, unspecified organism: Secondary | ICD-10-CM | POA: Diagnosis not present

## 2019-01-04 DIAGNOSIS — M069 Rheumatoid arthritis, unspecified: Secondary | ICD-10-CM | POA: Diagnosis not present

## 2019-01-04 DIAGNOSIS — R0902 Hypoxemia: Secondary | ICD-10-CM | POA: Diagnosis not present

## 2019-01-04 DIAGNOSIS — N3281 Overactive bladder: Secondary | ICD-10-CM | POA: Diagnosis not present

## 2019-01-04 DIAGNOSIS — Z9981 Dependence on supplemental oxygen: Secondary | ICD-10-CM | POA: Diagnosis not present

## 2019-01-04 DIAGNOSIS — B370 Candidal stomatitis: Secondary | ICD-10-CM | POA: Diagnosis not present

## 2019-01-04 DIAGNOSIS — I5022 Chronic systolic (congestive) heart failure: Secondary | ICD-10-CM | POA: Diagnosis not present

## 2019-01-04 DIAGNOSIS — J8489 Other specified interstitial pulmonary diseases: Secondary | ICD-10-CM | POA: Diagnosis not present

## 2019-01-04 DIAGNOSIS — A419 Sepsis, unspecified organism: Secondary | ICD-10-CM | POA: Diagnosis not present

## 2019-01-04 DIAGNOSIS — N400 Enlarged prostate without lower urinary tract symptoms: Secondary | ICD-10-CM | POA: Diagnosis not present

## 2019-01-04 DIAGNOSIS — F0390 Unspecified dementia without behavioral disturbance: Secondary | ICD-10-CM | POA: Diagnosis not present

## 2019-01-04 DIAGNOSIS — R52 Pain, unspecified: Secondary | ICD-10-CM | POA: Diagnosis not present

## 2019-01-17 DIAGNOSIS — F0390 Unspecified dementia without behavioral disturbance: Secondary | ICD-10-CM | POA: Diagnosis not present

## 2019-01-21 DIAGNOSIS — I5022 Chronic systolic (congestive) heart failure: Secondary | ICD-10-CM | POA: Diagnosis not present

## 2019-01-21 DIAGNOSIS — N400 Enlarged prostate without lower urinary tract symptoms: Secondary | ICD-10-CM | POA: Diagnosis not present

## 2019-01-21 DIAGNOSIS — I25119 Atherosclerotic heart disease of native coronary artery with unspecified angina pectoris: Secondary | ICD-10-CM | POA: Diagnosis not present

## 2019-01-21 DIAGNOSIS — F322 Major depressive disorder, single episode, severe without psychotic features: Secondary | ICD-10-CM | POA: Diagnosis not present

## 2019-01-21 DIAGNOSIS — I10 Essential (primary) hypertension: Secondary | ICD-10-CM | POA: Diagnosis not present

## 2019-01-21 DIAGNOSIS — E785 Hyperlipidemia, unspecified: Secondary | ICD-10-CM | POA: Diagnosis not present

## 2019-01-21 DIAGNOSIS — M069 Rheumatoid arthritis, unspecified: Secondary | ICD-10-CM | POA: Diagnosis not present

## 2019-01-21 DIAGNOSIS — D649 Anemia, unspecified: Secondary | ICD-10-CM | POA: Diagnosis not present

## 2019-01-25 ENCOUNTER — Emergency Department (HOSPITAL_COMMUNITY)
Admission: EM | Admit: 2019-01-25 | Discharge: 2019-01-27 | Disposition: A | Discharge status: Home / Self Care | Payer: PPO | Attending: Emergency Medicine | Admitting: Emergency Medicine

## 2019-01-25 ENCOUNTER — Other Ambulatory Visit: Payer: Self-pay

## 2019-01-25 ENCOUNTER — Emergency Department (HOSPITAL_COMMUNITY): Payer: PPO

## 2019-01-25 ENCOUNTER — Encounter (HOSPITAL_COMMUNITY): Payer: Self-pay | Admitting: Emergency Medicine

## 2019-01-25 DIAGNOSIS — R627 Adult failure to thrive: Secondary | ICD-10-CM | POA: Insufficient documentation

## 2019-01-25 DIAGNOSIS — N400 Enlarged prostate without lower urinary tract symptoms: Secondary | ICD-10-CM | POA: Diagnosis not present

## 2019-01-25 DIAGNOSIS — I5022 Chronic systolic (congestive) heart failure: Secondary | ICD-10-CM | POA: Diagnosis not present

## 2019-01-25 DIAGNOSIS — R0902 Hypoxemia: Secondary | ICD-10-CM | POA: Diagnosis not present

## 2019-01-25 DIAGNOSIS — E785 Hyperlipidemia, unspecified: Secondary | ICD-10-CM | POA: Diagnosis not present

## 2019-01-25 DIAGNOSIS — I2510 Atherosclerotic heart disease of native coronary artery without angina pectoris: Secondary | ICD-10-CM | POA: Diagnosis not present

## 2019-01-25 DIAGNOSIS — I10 Essential (primary) hypertension: Secondary | ICD-10-CM | POA: Diagnosis not present

## 2019-01-25 DIAGNOSIS — Z79899 Other long term (current) drug therapy: Secondary | ICD-10-CM | POA: Insufficient documentation

## 2019-01-25 DIAGNOSIS — M069 Rheumatoid arthritis, unspecified: Secondary | ICD-10-CM | POA: Diagnosis not present

## 2019-01-25 DIAGNOSIS — R6251 Failure to thrive (child): Secondary | ICD-10-CM | POA: Diagnosis not present

## 2019-01-25 DIAGNOSIS — F322 Major depressive disorder, single episode, severe without psychotic features: Secondary | ICD-10-CM | POA: Diagnosis not present

## 2019-01-25 DIAGNOSIS — J9811 Atelectasis: Secondary | ICD-10-CM | POA: Diagnosis not present

## 2019-01-25 DIAGNOSIS — J449 Chronic obstructive pulmonary disease, unspecified: Secondary | ICD-10-CM | POA: Diagnosis not present

## 2019-01-25 DIAGNOSIS — I110 Hypertensive heart disease with heart failure: Secondary | ICD-10-CM | POA: Diagnosis not present

## 2019-01-25 DIAGNOSIS — I252 Old myocardial infarction: Secondary | ICD-10-CM | POA: Diagnosis not present

## 2019-01-25 DIAGNOSIS — Z87891 Personal history of nicotine dependence: Secondary | ICD-10-CM | POA: Diagnosis not present

## 2019-01-25 DIAGNOSIS — I444 Left anterior fascicular block: Secondary | ICD-10-CM | POA: Diagnosis not present

## 2019-01-25 LAB — COMPREHENSIVE METABOLIC PANEL
ALT: 24 U/L (ref 0–44)
AST: 30 U/L (ref 15–41)
Albumin: 2.8 g/dL — ABNORMAL LOW (ref 3.5–5.0)
Alkaline Phosphatase: 59 U/L (ref 38–126)
Anion gap: 10 (ref 5–15)
BUN: 13 mg/dL (ref 8–23)
CO2: 25 mmol/L (ref 22–32)
Calcium: 8.9 mg/dL (ref 8.9–10.3)
Chloride: 99 mmol/L (ref 98–111)
Creatinine, Ser: 0.85 mg/dL (ref 0.61–1.24)
GFR calc Af Amer: >60 mL/min (ref >60)
GFR calc non Af Amer: >60 mL/min (ref >60)
Glucose, Bld: 100 mg/dL — ABNORMAL HIGH (ref 70–99)
Potassium: 3.6 mmol/L (ref 3.5–5.1)
Sodium: 134 mmol/L — ABNORMAL LOW (ref 135–145)
Total Bilirubin: 0.5 mg/dL (ref 0.3–1.2)
Total Protein: 6.4 g/dL — ABNORMAL LOW (ref 6.5–8.1)

## 2019-01-25 LAB — CBC WITH DIFFERENTIAL/PLATELET
Abs Immature Granulocytes: 0.04 10*3/uL (ref 0.00–0.07)
Basophils Absolute: 0 10*3/uL (ref 0.0–0.1)
Basophils Relative: 0 %
Eosinophils Absolute: 0.3 10*3/uL (ref 0.0–0.5)
Eosinophils Relative: 3 %
HCT: 44.5 % (ref 39.0–52.0)
Hemoglobin: 14.5 g/dL (ref 13.0–17.0)
Immature Granulocytes: 0 %
Lymphocytes Relative: 15 %
Lymphs Abs: 1.4 10*3/uL (ref 0.7–4.0)
MCH: 29.1 pg (ref 26.0–34.0)
MCHC: 32.6 g/dL (ref 30.0–36.0)
MCV: 89.2 fL (ref 80.0–100.0)
Monocytes Absolute: 0.7 10*3/uL (ref 0.1–1.0)
Monocytes Relative: 8 %
Neutro Abs: 7.1 10*3/uL (ref 1.7–7.7)
Neutrophils Relative %: 74 %
Platelets: 262 10*3/uL (ref 150–400)
RBC: 4.99 MIL/uL (ref 4.22–5.81)
RDW: 13.4 % (ref 11.5–15.5)
WBC: 9.6 10*3/uL (ref 4.0–10.5)
nRBC: 0 % (ref 0.0–0.2)

## 2019-01-25 LAB — CBG MONITORING, ED: Glucose-Capillary: 83 mg/dL (ref 70–99)

## 2019-01-25 LAB — URINALYSIS, ROUTINE W REFLEX MICROSCOPIC
Bilirubin Urine: NEGATIVE
Glucose, UA: NEGATIVE mg/dL
Hgb urine dipstick: NEGATIVE
Ketones, ur: NEGATIVE mg/dL
Leukocytes,Ua: NEGATIVE
Nitrite: NEGATIVE
Protein, ur: NEGATIVE mg/dL
Specific Gravity, Urine: 1.01 (ref 1.005–1.030)
pH: 7 (ref 5.0–8.0)

## 2019-01-25 MED ORDER — SODIUM CHLORIDE 0.9 % IV BOLUS: 1000.0000 mL | Freq: Once | INTRAVENOUS | Status: DC

## 2019-01-25 NOTE — ED Triage Notes (Signed)
Patient arrived from home via [facility]. Per EMS, patient's family said he has alzheimer's at baseline but he has not been "wanting to do anything" in the last 4 days. Bed bound at baseline. On arrival patient is A&O X3, he states "I dont want to be here."

## 2019-01-25 NOTE — ED Notes (Signed)
Condom cath placed.

## 2019-01-25 NOTE — ED Notes (Signed)
Pt had removed condom cath so I put another in place

## 2019-01-25 NOTE — ED Provider Notes (Addendum)
[facility] EMERGENCY DEPARTMENT Provider Note   CSN: [id number] Arrival date & time: 01/25/19  1316   History   Chief Complaint Chief Complaint  Patient presents with  . Failure To Thrive    HPI Anthony [name] is a 85 y.o. male.     HPI    85 year old male with a significant past medical history of congestive heart failure, CAD, rheumatoid arthritis and dementia presents today for failure to thrive at home.  #5 caveat due to dementia.  I spoke with the patient's daughter Anthony [name] at bedside.  She reports that since his discharge from collapse nursing facility in the middle of December he has been living with her.  She notes that he has become progressively weak and notes that over the last 4 days he has only been out of the bed once.  She notes that he has some chronic pain and was placed on meloxicam which made him even weaker.  She notes that he has been overall well with no infectious symptoms and is at baseline mental status presently.  She reports that Dr. Inda Merlin his primary care provider recommended that he come to the emergency room be evaluated and admitted to the hospital so that the hospital could adjust his medications and then place him into a skilled nursing facility.      Past Medical History:  Diagnosis Date  . Arthritis    rheumatoid-  Dr Barkley Boards  . CHF (congestive heart failure) (HCC)    chronic systolic heart failure per Dr [name] LOV note 09/23/11.   EKG 4/12, stress test  and cath from 8/12 on chart.  Clearance with LOV Dr [name] on chart  . Coronary artery disease   . Crohn disease ([place])   . Dysrhythmia    nonsustained,asymptomatic VT per Dr [name] office note  resulting in cath  . GERD (gastroesophageal reflux disease)   . Heart attack ([place]) 1996, 06/2011  . Hyperlipidemia   . Hypertension   . Pneumonia 1993  . Prostate troubles    states take alternating meds for bladder/prostate control- "age thing"  . Sleep apnea    sleep study years  ago- has apnea but did not qualify for CPAP    Patient Active Problem List   Diagnosis Date Noted  . Fever   . Nonspecific interstitial pneumonia ([place])   . Febrile illness   . Thrush   . Hypoxia   . Sepsis ([place]) 10/04/2018  . Fatty liver 04/06/2018  . Acute pancreatitis 04/04/2018  . Abdominal pain, right lower quadrant 05/28/2015  . Absolute anemia 05/28/2015  . Aortic atherosclerosis ([place]) 05/28/2015  . Altered blood in stool 05/28/2015  . Chronic systolic heart failure ([place]) 05/28/2015  . Syncope and collapse 05/28/2015  . Arteriosclerosis of coronary artery 05/28/2015  . Cough 05/28/2015  . Crohn's disease of large bowel ([place]) 05/28/2015  . D (diarrhea) 05/28/2015  . Displacement of cervical intervertebral disc without myelopathy 05/28/2015  . Benign essential HTN 05/28/2015  . Adenopathy 05/28/2015  . Hypercholesterolemia without hypertriglyceridemia 05/28/2015  . Hypo-osmolality and hyponatremia 05/28/2015  . Postinflammatory pulmonary fibrosis ([place]) 05/28/2015  . Healed myocardial infarct 05/28/2015  . Peptic esophagitis 05/28/2015  . 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 Paroxysmal ventricular tachycardia ([place]) 05/28/2015  . Decreased body weight 05/28/2015  . Weakness 11/05/2014  . Nausea with vomiting 11/05/2014  . Generalized weakness   . Dehydration   . Difficulty walking   . Pulmonary infiltrates 07/16/2014  . Chronic combined systolic and diastolic heart  failure ([place]) 05/31/2014  . Essential hypertension 05/31/2014  .  H/O Right inguinal hernia repair 01/15/2012  . Small fat containing right inguinal hernia that has been painful 08/05/2011  . Finger wound, simple, open 10/12/2008  . Benign prostatic hyperplasia without urinary obstruction 04/30/2008  . Synovitis and tenosynovitis 04/30/2008  . HLD (hyperlipidemia) 04/12/2008  . COLONIC POLYPS 11/21/2007  . MI 11/21/2007  . Coronary atherosclerosis 11/21/2007  . GERD 11/21/2007  . Rheumatoid arthritis ([place])  11/21/2007  . SLEEP APNEA, MILD 11/21/2007    Past Surgical History:  Procedure Laterality Date  . CARDIAC CATHETERIZATION     1996/ 2012 report on chart  . COLONOSCOPY    . INGUINAL HERNIA REPAIR  11/04/2011   Procedure: HERNIA REPAIR INGUINAL ADULT;  Surgeon: Pedro Earls, MD;  Location: [facility] ORS;  Service: General;  Laterality: Right;  Right Inguinal Hernia Repair with Mesh  . KNEE ARTHROSCOPY Right 1976  . MENISCUS DEBRIDEMENT Left 1996  . TRIGGER FINGER RELEASE Right 10/10/2009  . UMBILICAL HERNIA REPAIR  2009  . WRIST SURGERY Left 04/25/91        Home Medications    Prior to Admission medications   Medication Sig Start Date End Date Taking? Authorizing Provider  acetaminophen (TYLENOL) 500 MG tablet Take 500 mg by mouth 2 (two) times daily.    [provider]  CALCIUM-VITAMIN D PO Take 1 tablet by mouth every evening.     [provider]  carvedilol (COREG) 6.25 MG tablet TAKE 1 TABLET TWICE A DAY WITH A MEAL. Patient taking differently: Take 6.25 mg by mouth 2 (two) times daily with a meal.  02/22/18   Burtis Junes, NP  Cholecalciferol (VITAMIN D3) 50 MCG (2000 UT) capsule Take 2,000 Units by mouth every morning.    [provider]  Coenzyme Q10 (CO Q-10) 100 MG CAPS Take 100 mg by mouth every evening.     [provider]  Cyanocobalamin (B-12) 500 MCG TABS Take 500 mcg by mouth every evening.    [provider]  donepezil (ARICEPT) 5 MG tablet Take 5 mg by mouth at bedtime. 08/15/18   [provider]  famotidine (PEPCID) 20 MG tablet Take 40 mg by mouth at bedtime.    [provider]  feeding supplement, ENSURE ENLIVE, (ENSURE ENLIVE) LIQD Take 237 mLs by mouth 2 (two) times daily between meals. 04/07/18   Debbe Odea, MD  FLUoxetine (PROZAC) 20 MG capsule Take 20 mg by mouth every morning.  09/06/16   [provider]  folic acid (FOLVITE) 263 MCG tablet Take 400 mcg by mouth every evening.      [provider]  guaiFENesin-dextromethorphan (ROBITUSSIN DM) 100-10 MG/5ML syrup Take 5 mLs by mouth every 4 (four) hours as needed for cough. Patient not taking: Reported on 11/08/2018 10/13/18   Arrien, Jimmy Picket, MD  insulin aspart (NOVOLOG) 100 UNIT/ML injection Inject into the skin. As directed    [provider]  insulin glargine (LANTUS) 100 UNIT/ML injection Inject 25 Units into the skin daily.    [provider]  loperamide (IMODIUM) 2 MG capsule Take 2 mg by mouth every evening.    [provider]  magnesium oxide (MAG-OX) 400 MG tablet Take 200 mg by mouth daily.     [provider]  mesalamine (LIALDA) 1.2 G EC tablet Take 4.8 g by mouth daily with breakfast.     [provider]  nitroGLYCERIN (NITROSTAT) 0.4 MG SL tablet Place 0.4 mg  under the tongue every 5 (five) minutes as needed. Chest pain     [provider]  oxybutynin (DITROPAN) 5 MG tablet Take 5 mg by mouth 2 (two) times daily. 09/20/18   [provider]  pravastatin (PRAVACHOL) 40 MG tablet Take 1 tablet (40 mg total) by mouth at bedtime. 03/08/18   Belva Crome, MD  tamsulosin (FLOMAX) 0.4 MG CAPS capsule Take 0.4 mg by mouth daily after supper.  10/01/14   [provider]  traMADol (ULTRAM) 50 MG tablet Take 50 mg by mouth every 6 (six) hours as needed.    [provider]  vitamin A 10000 UNIT capsule Take 10,000 Units by mouth every evening.     [provider]  vitamin E 400 UNIT capsule Take 400 Units by mouth every evening.     [provider]    Family History Family History  Problem Relation Age of Onset  . Heart disease Mother   . Rheum arthritis Mother   . Emphysema Father     Social History Social History   Tobacco Use  . Smoking status: Former Smoker    Packs/day: 3.00    Years: 20.00    Pack years: 60.00    Types: Cigarettes    Last attempt to quit: 11/01/1970    Years since  quitting: 48.2  . Smokeless tobacco: Never Used  . Tobacco comment: quit 1967  Substance Use Topics  . Alcohol use: No  . Drug use: No     Allergies   Patient has no known allergies.   Review of Systems Review of Systems  All other systems reviewed and are negative.   Physical Exam Updated Vital Signs BP 103/88   Pulse 71   Temp 98.1 F (36.7 C) (Oral)   Resp 16   SpO2 97%   Physical Exam Vitals signs and nursing note reviewed.  Constitutional:      Appearance: He is well-developed.  HENT:     Head: Normocephalic and atraumatic.  Eyes:     General: No scleral icterus.       Right eye: No discharge.        Left eye: No discharge.     Conjunctiva/sclera: Conjunctivae normal.     Pupils: Pupils are equal, round, and reactive to light.  Neck:     Musculoskeletal: Normal range of motion.     Vascular: No JVD.     Trachea: No tracheal deviation.  Cardiovascular:     Rate and Rhythm: Normal rate.  Pulmonary:     Effort: Pulmonary effort is normal. No respiratory distress.     Breath sounds: Normal breath sounds. No stridor. No wheezing, rhonchi or rales.  Abdominal:     General: Bowel sounds are normal. There is no distension.     Palpations: Abdomen is soft.     Tenderness: There is no abdominal tenderness.  Skin:    Comments: Superficial sacral sore with no surrounding infection-no purulence  Chronic sore noted to the left heel with no surrounding erythema or purulence  Neurological:     Mental Status: He is alert and oriented to person, place, and time.     Coordination: Coordination normal.  Psychiatric:        Behavior: Behavior normal.        Thought Content: Thought content normal.        Judgment: Judgment normal.     ED Treatments / Results  Labs (all labs ordered are listed, but only  abnormal results are displayed) Labs Reviewed  COMPREHENSIVE METABOLIC PANEL - Abnormal; Notable for the following components:      Result Value   Sodium 134 (*)     Glucose, Bld 100 (*)    Total Protein 6.4 (*)    Albumin 2.8 (*)    All other components within normal limits  CBC WITH DIFFERENTIAL/PLATELET  URINALYSIS, ROUTINE W REFLEX MICROSCOPIC  CBG MONITORING, ED    EKG EKG Interpretation  Date/Time:  Wednesday January 25 2019 13:17:33 EST Ventricular Rate:  74 PR Interval:    QRS Duration: 120 QT Interval:  424 QTC Calculation: 471 R Axis:   -79 Text Interpretation:  Sinus rhythm Left anterior fascicular block No significant change was found Confirmed by Jola Schmidt [phone]) on 01/25/2019 3:08:34 PM   Radiology No results found.  Procedures Procedures (including critical care time)  Medications Ordered in ED Medications  sodium chloride 0.9 % bolus 1,000 mL (has no administration in time range)     Initial Impression / Assessment and Plan / ED Course  I have reviewed the triage vital signs and the nursing notes.  Pertinent labs & imaging results that were available during my care of the patient were reviewed by me and considered in my medical decision making (see chart for details).        Labs: CBC CMP  Imaging: DG chest 2 view  Consults:  Therapeutics:  Discharge Meds:   Assessment/Plan: 85 year old male presents today with progressive weakness.  He was recommended by primary care that he be evaluated in the emergency room for placement.  Patient looks very well-appearing on my exam, he is pleasantly demented with no signs of objective infection presently.  The patient cannot provide any significant details.  I discussed case with case manager as patient will likely need outpatient management.  Basic labs and imaging will be ordered to rule out any infectious causes of progressive weakness.  If none are noted patient will be pending home management with home health care or transfer to skilled nursing facility.  Patient care signed to oncoming provider pending laboratory results and disposition    Final Clinical  Impressions(s) / ED Diagnoses   Final diagnoses:  Failure to thrive in adult    ED Discharge Orders    None        Francee Gentile 01/25/19 1542    Jola Schmidt, MD 01/25/19 [phone]

## 2019-01-25 NOTE — Progress Notes (Addendum)
CSW received a call from RN CM stating pt no longer has a bed in the psychiatric unit of the ED for his overnight stay as previously thought as the psyche unit is now full.  RN CM states pt's daughter Thurman Coyer is upset pt has a hallway bed and wants to take the pt home while pt is awaiting a possible D/C to [facility] on 3/5 where the pt has a pending referral and a pending insurance authorization decision.  RN CM asks the CSW to explain where pt is regarding pt's pending SNF referral so family understands pt's disposition plan from the ED as it stands now.  CSW spoke to pt's daughter [name] at ph: [phone] and updated her and was told pt's other daughter Thurman Coyer was bedside at ph: [phone].  CSW called Miss Chauncey Fischer who was bedside via RN's phone and explained the above.  Pt's sister was appreciative, understanding of the reason pt is in a hall bed versus a room and thanked the CSW for the CSW's time.  CSW will continue to follow for D/C needs.  [name]. Sirenity Shew, LCSW, LCAS, CSI Clinical Social Worker Ph: [phone]

## 2019-01-25 NOTE — Evaluation (Signed)
Physical Therapy Evaluation Patient Details Name: Anthony [name] MRN: [id number] DOB: 15-Jan-1934 Today's Date: 01/25/2019   History of Present Illness  Pt is an 85 y/o male presenting to the ED secondary to progressive weakness and assist with medicine management per notes. PMH includes CAD, CHF, dementia, and HTN.   Clinical Impression  Pt presenting to ED with problem above and deficits below. Pt requiring heavy max A to perform bed mobility tasks and required max A to sit at EOB. Pt reporting pain on bottom and unable to safely attempt further mobility. Per daughter, pt has had increased difficulty performing mobility tasks at home. Feel pt is at increased risk for falls and will require increased assist at home. Will continue to follow acutely to maximize functional mobility independence and safety.     Follow Up Recommendations SNF;Supervision for mobility/OOB    Equipment Recommendations  None recommended by PT    Recommendations for Other Services       Precautions / Restrictions Precautions Precautions: Fall Restrictions Weight Bearing Restrictions: No      Mobility  Bed Mobility Overal bed mobility: Needs Assistance Bed Mobility: Supine to Sit;Sit to Supine     Supine to sit: Max assist Sit to supine: Max assist   General bed mobility comments: Max A for LE assist and trunk elevation to come to sitting at EOB. Pt with poor sitting balance and required external assist to maintain sitting balance. Pt also reporting increased pain on bottom, so further mobility deferred. REquired max A for trunk assist and LE assist for return to supine.   Transfers                 General transfer comment: unable   Ambulation/Gait                Stairs            Wheelchair Mobility    Modified Rankin (Stroke Patients Only)       Balance Overall balance assessment: Needs assistance Sitting-balance support: Bilateral upper extremity supported;Feet  supported Sitting balance-Leahy Scale: Poor Sitting balance - Comments: Required max A for sitting balance at EOB.                                      Pertinent Vitals/Pain Pain Assessment: Faces Faces Pain Scale: Hurts even more Pain Location: bottom  Pain Descriptors / Indicators: Grimacing;Guarding Pain Intervention(s): Limited activity within patient's tolerance;Monitored during session;Repositioned    Home Living Family/patient expects to be discharged to:: Skilled nursing facility                      Prior Function Level of Independence: Needs assistance   Gait / Transfers Assistance Needed: Before he had gotten so weak, daughter reports pt was ambulating with cane. Since he has gotten sick and weak, daughter reports pt has been using WC for primary mobility, however, did ambulate short distances with RW.   ADL's / Homemaking Assistance Needed: Daughter reports pt required assist for all ADL/IADL except for feeding himself.         Hand Dominance        Extremity/Trunk Assessment   Upper Extremity Assessment Upper Extremity Assessment: Generalized weakness    Lower Extremity Assessment Lower Extremity Assessment: Generalized weakness;RLE deficits/detail;LLE deficits/detail RLE Deficits / Details: RLE knee pain at baseline. Daughter reports R knee will buckle on pt  during functional mobility tasks.  LLE Deficits / Details: Pressure sore on L heel. Able to perform heel slide and ankle pump.        Communication   Communication: No difficulties  Cognition Arousal/Alertness: Awake/alert Behavior During Therapy: WFL for tasks assessed/performed Overall Cognitive Status: History of cognitive impairments - at baseline                                 General Comments: Dementia at baseline       General Comments General comments (skin integrity, edema, etc.): Pt's daughter present during session. Very concerned about return  home given pt's new mobility deficits.     Exercises     Assessment/Plan    PT Assessment Patient needs continued PT services  PT Problem List Decreased strength;Decreased balance;Decreased activity tolerance;Decreased mobility;Decreased cognition;Decreased knowledge of use of DME;Decreased safety awareness;Decreased knowledge of precautions;Pain       PT Treatment Interventions DME instruction;Gait training;Functional mobility training;Therapeutic activities;Balance training;Therapeutic exercise;Patient/family education    PT Goals (Current goals can be found in the Care Plan section)  Acute Rehab PT Goals Patient Stated Goal: to go to SNF for therapy per daughter PT Goal Formulation: With family Time For Goal Achievement: 02/08/19 Potential to Achieve Goals: Fair    Frequency Min 2X/week   Barriers to discharge        Co-evaluation               AM-PAC PT "6 Clicks" Mobility  Outcome Measure Help needed turning from your back to your side while in a flat bed without using bedrails?: A Lot Help needed moving from lying on your back to sitting on the side of a flat bed without using bedrails?: A Lot Help needed moving to and from a bed to a chair (including a wheelchair)?: Total Help needed standing up from a chair using your arms (e.g., wheelchair or bedside chair)?: Total Help needed to walk in hospital room?: Total Help needed climbing 3-5 steps with a railing? : Total 6 Click Score: 8    End of Session   Activity Tolerance: Patient limited by pain Patient left: in bed;with call bell/phone within reach;with family/visitor present Nurse Communication: Mobility status PT Visit Diagnosis: Muscle weakness (generalized) (M62.81);Unsteadiness on feet (R26.81);Difficulty in walking, not elsewhere classified (R26.2)    Time: 1856-3149 PT Time Calculation (min) (ACUTE ONLY): 20 min   Charges:   PT Evaluation $PT Eval Moderate Complexity: [place], PT, DPT  Acute Rehabilitation Services  Pager: [phone] Office: [phone]   Rudean Hitt 01/25/2019, 4:12 PM

## 2019-01-25 NOTE — Progress Notes (Signed)
CSW made aware that pt is in need of SNF placement. CSW spoke with pt and daughter [name] at bedside and was informed that pt has been at a facility in the past and is in need of further rehab at this time. Per request of daughter CSW has faxed information to [facility] in [place]. CSW spoke with Elyse Hsu from the facility and confirmed that they do have beds open. CSW spoke with PT and was advised that pt is in need of SNF placement. CSW will began auth once PT notes have been placed for HTA to see and review for insurance auth. CSW updated pt and daughter at bedside.      Virgie Dad Jonathen Rathman, MSW, [facility] Clinical Social Worker [phone]

## 2019-01-25 NOTE — ED Provider Notes (Signed)
Anthony [name] is a 85 y.o. male, presenting to the ED with failure to thrive.   HPI from Lenn Sink, PA-C: "85 year old male with a significant past medical history of congestive heart failure, CAD, rheumatoid arthritis and dementia presents today for failure to thrive at home.  #5 caveat due to dementia.  I spoke with the patient's daughter [name] at bedside.  She reports that since his discharge from collapse nursing facility in the middle of December he has been living with her.  She notes that he has become progressively weak and notes that over the last 4 days he has only been out of the bed once.  She notes that he has some chronic pain and was placed on meloxicam which made him even weaker.  She notes that he has been overall well with no infectious symptoms and is at baseline mental status presently.  She reports that Dr. Inda Merlin his primary care provider recommended that he come to the emergency room be evaluated and admitted to the hospital so that the hospital could adjust his medications and then place him into a skilled nursing facility. "    Physical Exam  BP 103/88   Pulse 71   Temp 98.1 F (36.7 C) (Oral)   Resp 16   SpO2 97%   Physical Exam Vitals signs and nursing note reviewed.  Constitutional:      General: He is not in acute distress.    Appearance: He is well-developed. He is not diaphoretic.  HENT:     Head: Normocephalic and atraumatic.  Eyes:     Conjunctiva/sclera: Conjunctivae normal.  Neck:     Musculoskeletal: Neck supple.  Cardiovascular:     Rate and Rhythm: Normal rate and regular rhythm.  Pulmonary:     Effort: Pulmonary effort is normal.  Skin:    General: Skin is warm and dry.     Coloration: Skin is not pale.  Neurological:     Mental Status: He is alert.  Psychiatric:        Behavior: Behavior normal.     ED Course/Procedures   Clinical Course as of Jan 26 16  Wed Jan 25, 2019  1736 Patient denies any current complaints.   [SJ]   2136 Spoke with Dr. Inda Merlin, patient's PCP.  He states the patient's daughter has been calling him, threatening him, and telling him he has not been taking proper care of the patient. We discussed the patient's lab and imaging results as well as the plan for expected placement tomorrow.  He agrees with this plan.   [SJ]    Clinical Course User Index [SJ] Natina Wiginton C, PA-C    Procedures      Abnormal Labs Reviewed  COMPREHENSIVE METABOLIC PANEL - Abnormal; Notable for the following components:      Result Value   Sodium 134 (*)    Glucose, Bld 100 (*)    Total Protein 6.4 (*)    Albumin 2.8 (*)    All other components within normal limits    Dg Chest 2 View  Result Date: 01/25/2019 CLINICAL DATA:  Weakness, unable to stand for 4 days, low blood pressure, diabetes mellitus, smoker, COPD, leading to RIGHT EXAM: CHEST - 2 VIEW COMPARISON:  10/10/2018 FINDINGS: Normal heart size, mediastinal contours, and pulmonary vascularity. Bibasilar interstitial prominence likely representing atelectasis. Lungs otherwise clear. No infiltrate, pleural effusion or pneumothorax. Bones demineralized. IMPRESSION: Bibasilar atelectasis without infiltrate. Resolution of pulmonary infiltrates seen on previous exam. Electronically Signed  By: Lavonia Dana M.D.   On: 01/25/2019 17:18     EKG Interpretation  Date/Time:  Wednesday January 25 2019 13:17:33 EST Ventricular Rate:  74 PR Interval:    QRS Duration: 120 QT Interval:  424 QTC Calculation: 471 R Axis:   -79 Text Interpretation:  Sinus rhythm Left anterior fascicular block No significant change was found Confirmed by Jola Schmidt [phone]) on 01/25/2019 3:08:34 PM       MDM    Clinical Course as of Jan 26 16  Wed Jan 25, 2019  1736 Patient denies any current complaints.   [SJ]  2136 Spoke with Dr. Inda Merlin, patient's PCP.  He states the patient's daughter has been calling him, threatening him, and telling him he has not been taking proper care  of the patient. We discussed the patient's lab and imaging results as well as the plan for expected placement tomorrow.  He agrees with this plan.   [SJ]    Clinical Course User Index [SJ] Lorayne Bender, PA-C   Patient care handoff report received from [facility], PA-C. Plan: Social work is working on placing the patient in a long-term facility.  Last noted plan by social work was for patient to possibly be placed at [facility] and insurance authorization has been initiated.  Decision to be expected tomorrow.  Patient will be boarded in the ED tonight due to lack of admission criteria. Unfortunately due to census in the ED, patient had to be held in the hallway.  This was unable to be avoided.  When I discussed the plan for possible placement tomorrow, the patient and his daughter at the bedside were pleased, however, when I discussed that they would likely need to spend the night in the hallway, his daughter was upset.  At the end of my shift, the patient's case and plan was discussed with oncoming provider.   Vitals:   01/25/19 1400 01/25/19 1415 01/25/19 1430 01/25/19 1439  BP: 108/61 105/66 103/88   Pulse: 69 74 70 71  Resp: 15 13 16 16   Temp:      TempSrc:      SpO2: 95% 98% 97% 97%       Lorayne Bender, PA-C 01/26/19 0026    Veryl Speak, MD 01/26/19 2302

## 2019-01-25 NOTE — NC FL2 (Signed)
[place] LEVEL OF CARE SCREENING TOOL     IDENTIFICATION  Patient Name: Anthony [name] Birthdate: 11-03-1934 Sex: male Admission Date (Current Location): 01/25/2019  [facility] and [place] Number:  Herbalist and Address:  [facility], [place] [address], [place], [place] [address]      Provider Number: [id number]  Attending Physician Name and Address:  Jola Schmidt, MD  Relative Name and Phone Number:       Current Level of Care: Hospital Recommended Level of Care: [place] Prior Approval Number:    Date Approved/Denied:   PASRR Number:   [id number]   Discharge Plan: SNF    Current Diagnoses: Patient Active Problem List   Diagnosis Date Noted  . Fever   . Nonspecific interstitial pneumonia ([place])   . Febrile illness   . Thrush   . Hypoxia   . Sepsis ([place]) 10/04/2018  . Fatty liver 04/06/2018  . Acute pancreatitis 04/04/2018  . Abdominal pain, right lower quadrant 05/28/2015  . Absolute anemia 05/28/2015  . Aortic atherosclerosis ([place]) 05/28/2015  . Altered blood in stool 05/28/2015  . Chronic systolic heart failure ([place]) 05/28/2015  . Syncope and collapse 05/28/2015  . Arteriosclerosis of coronary artery 05/28/2015  . Cough 05/28/2015  . Crohn's disease of large bowel ([place]) 05/28/2015  . D (diarrhea) 05/28/2015  . Displacement of cervical intervertebral disc without myelopathy 05/28/2015  . Benign essential HTN 05/28/2015  . Adenopathy 05/28/2015  . Hypercholesterolemia without hypertriglyceridemia 05/28/2015  . Hypo-osmolality and hyponatremia 05/28/2015  . Postinflammatory pulmonary fibrosis ([place]) 05/28/2015  . Healed myocardial infarct 05/28/2015  . Peptic esophagitis 05/28/2015  . 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 Paroxysmal ventricular tachycardia ([place]) 05/28/2015  . Decreased body weight 05/28/2015  . Weakness 11/05/2014  . Nausea with vomiting 11/05/2014  . Generalized weakness   . Dehydration    . Difficulty walking   . Pulmonary infiltrates 07/16/2014  . Chronic combined systolic and diastolic heart failure ([place]) 05/31/2014  . Essential hypertension 05/31/2014  .  H/O Right inguinal hernia repair 01/15/2012  . Small fat containing right inguinal hernia that has been painful 08/05/2011  . Finger wound, simple, open 10/12/2008  . Benign prostatic hyperplasia without urinary obstruction 04/30/2008  . Synovitis and tenosynovitis 04/30/2008  . HLD (hyperlipidemia) 04/12/2008  . COLONIC POLYPS 11/21/2007  . MI 11/21/2007  . Coronary atherosclerosis 11/21/2007  . GERD 11/21/2007  . Rheumatoid arthritis ([place]) 11/21/2007  . SLEEP APNEA, MILD 11/21/2007    Orientation RESPIRATION BLADDER Height & Weight     (alert times 3)  Normal Incontinent Weight:   Height:     BEHAVIORAL SYMPTOMS/MOOD NEUROLOGICAL BOWEL NUTRITION STATUS      Incontinent Diet(please see discharge summary.)  AMBULATORY STATUS COMMUNICATION OF NEEDS Skin   Extensive Assist Verbally Normal                       Personal Care Assistance Level of Assistance  Bathing, Feeding, Dressing Bathing Assistance: Maximum assistance Feeding assistance: Limited assistance Dressing Assistance: Maximum assistance     Functional Limitations Info  Sight, Hearing, Speech Sight Info: Adequate Hearing Info: Adequate Speech Info: Adequate    SPECIAL CARE FACTORS FREQUENCY  PT (By licensed PT), OT (By licensed OT)     PT Frequency: 5 times a week OT Frequency: 5 times a week             Contractures Contractures Info: Not present    Additional  Factors Info  Code Status, Allergies Code Status Info: Prior Allergies Info: NKA           Current Medications (01/25/2019):  This is the current hospital active medication list Current Facility-Administered Medications  Medication Dose Route Frequency Provider Last Rate Last Dose  . sodium chloride 0.9 % bolus 1,000 mL  1,000 mL Intravenous Once Jola Schmidt,  MD       Current Outpatient Medications  Medication Sig Dispense Refill  . acetaminophen (TYLENOL) 500 MG tablet Take 500 mg by mouth 2 (two) times daily.    Marland Kitchen CALCIUM-VITAMIN D PO Take 1 tablet by mouth every evening.     . carvedilol (COREG) 6.25 MG tablet TAKE 1 TABLET TWICE A DAY WITH A MEAL. (Patient taking differently: Take 6.25 mg by mouth 2 (two) times daily with a meal. ) 180 tablet 3  . Cholecalciferol (VITAMIN D3) 50 MCG (2000 UT) capsule Take 2,000 Units by mouth every morning.    . Coenzyme Q10 (CO Q-10) 100 MG CAPS Take 100 mg by mouth every evening.     . Cyanocobalamin (B-12) 500 MCG TABS Take 500 mcg by mouth every evening.    . donepezil (ARICEPT) 5 MG tablet Take 5 mg by mouth at bedtime.  5  . famotidine (PEPCID) 20 MG tablet Take 40 mg by mouth at bedtime.    . feeding supplement, ENSURE ENLIVE, (ENSURE ENLIVE) LIQD Take 237 mLs by mouth 2 (two) times daily between meals. 237 mL 12  . FLUoxetine (PROZAC) 20 MG capsule Take 20 mg by mouth every morning.     . folic acid (FOLVITE) 417 MCG tablet Take 400 mcg by mouth every evening.     Marland Kitchen guaiFENesin-dextromethorphan (ROBITUSSIN DM) 100-10 MG/5ML syrup Take 5 mLs by mouth every 4 (four) hours as needed for cough. (Patient not taking: Reported on 11/08/2018) 118 mL 0  . insulin aspart (NOVOLOG) 100 UNIT/ML injection Inject into the skin. As directed    . insulin glargine (LANTUS) 100 UNIT/ML injection Inject 25 Units into the skin daily.    Marland Kitchen loperamide (IMODIUM) 2 MG capsule Take 2 mg by mouth every evening.    . magnesium oxide (MAG-OX) 400 MG tablet Take 200 mg by mouth daily.     . mesalamine (LIALDA) 1.2 G EC tablet Take 4.8 g by mouth daily with breakfast.     . nitroGLYCERIN (NITROSTAT) 0.4 MG SL tablet Place 0.4 mg under the tongue every 5 (five) minutes as needed. Chest pain     . oxybutynin (DITROPAN) 5 MG tablet Take 5 mg by mouth 2 (two) times daily.  1  . pravastatin (PRAVACHOL) 40 MG tablet Take 1 tablet (40 mg  total) by mouth at bedtime. 30 tablet 11  . tamsulosin (FLOMAX) 0.4 MG CAPS capsule Take 0.4 mg by mouth daily after supper.   11  . traMADol (ULTRAM) 50 MG tablet Take 50 mg by mouth every 6 (six) hours as needed.    . vitamin A 10000 UNIT capsule Take 10,000 Units by mouth every evening.     . vitamin E 400 UNIT capsule Take 400 Units by mouth every evening.        Discharge Medications: Please see discharge summary for a list of discharge medications.  Relevant Imaging Results:  Relevant Lab Results:   Additional Information [id number].   Wetzel Bjornstad, LCSWA

## 2019-01-25 NOTE — Progress Notes (Addendum)
2nd shift ED CSW received a handoff from the 1st shift [facility] ED CSW.   CSW sent PT notes to [facility] via the hub and called Tammy at [employer] at ph: [phone] to begi ins auth.  [employer] states uns Josem Kaufmann will begin tonight and continue on into tomorrow and 1st shift [facility] ED CSW at [phone] will be contacted with the outcome/decision.  2nd shift ED CSW will leave handoff for 1st shift ED CSW.  Please reconsult if future social work needs arise.  CSW signing off, as social work intervention is no longer needed.  [name]. Oasis Goehring, LCSW, LCAS, CSI Clinical Social Worker Ph: [phone]

## 2019-01-26 LAB — CBG MONITORING, ED: Glucose-Capillary: 134 mg/dL — ABNORMAL HIGH (ref 70–99)

## 2019-01-26 MED ORDER — PREDNISONE 20 MG PO TABS
10.0000 mg | ORAL_TABLET | Freq: Every day | ORAL | Status: DC
  Administered 2019-01-27: 10 mg via ORAL
  Filled 2019-01-26: qty 1

## 2019-01-26 MED ORDER — FOLIC ACID 1 MG PO TABS
500.0000 ug | ORAL_TABLET | Freq: Every evening | ORAL | Status: DC
  Administered 2019-01-26: 0.5 mg via ORAL
  Filled 2019-01-26: qty 1

## 2019-01-26 MED ORDER — MAGNESIUM OXIDE 400 (241.3 MG) MG PO TABS
200.0000 mg | ORAL_TABLET | Freq: Every day | ORAL | Status: DC
  Administered 2019-01-26 – 2019-01-27 (×2): 200 mg via ORAL
  Filled 2019-01-26 (×2): qty 1

## 2019-01-26 MED ORDER — INSULIN GLARGINE 100 UNIT/ML ~~LOC~~ SOLN
25.0000 [IU] | Freq: Every day | SUBCUTANEOUS | Status: DC
  Filled 2019-01-26 (×3): qty 0.25

## 2019-01-26 MED ORDER — CARVEDILOL 3.125 MG PO TABS
3.1250 mg | ORAL_TABLET | Freq: Two times a day (BID) | ORAL | Status: DC
  Administered 2019-01-26: 3.125 mg via ORAL
  Filled 2019-01-26 (×4): qty 1

## 2019-01-26 MED ORDER — CYANOCOBALAMIN 500 MCG PO TABS
500.0000 ug | ORAL_TABLET | Freq: Every evening | ORAL | Status: DC
  Administered 2019-01-26: 500 ug via ORAL
  Filled 2019-01-26 (×2): qty 1

## 2019-01-26 MED ORDER — FLUOXETINE HCL 20 MG PO CAPS
20.0000 mg | ORAL_CAPSULE | Freq: Every day | ORAL | Status: DC
  Administered 2019-01-26 – 2019-01-27 (×2): 20 mg via ORAL
  Filled 2019-01-26 (×2): qty 1

## 2019-01-26 MED ORDER — DONEPEZIL HCL 5 MG PO TABS
5.0000 mg | ORAL_TABLET | Freq: Every day | ORAL | Status: DC
  Administered 2019-01-26: 5 mg via ORAL
  Filled 2019-01-26: qty 1

## 2019-01-26 MED ORDER — INSULIN ASPART 100 UNIT/ML ~~LOC~~ SOLN: 1.0000 [IU] | Freq: Three times a day (TID) | SUBCUTANEOUS | Status: DC

## 2019-01-26 MED ORDER — CALCIUM CARBONATE 1250 (500 CA) MG PO TABS
1.0000 | ORAL_TABLET | Freq: Every day | ORAL | Status: DC
  Filled 2019-01-26 (×2): qty 1

## 2019-01-26 MED ORDER — MESALAMINE 1.2 G PO TBEC
2.4000 g | DELAYED_RELEASE_TABLET | Freq: Two times a day (BID) | ORAL | Status: DC
  Administered 2019-01-26 – 2019-01-27 (×2): 2.4 g via ORAL
  Filled 2019-01-26 (×3): qty 2

## 2019-01-26 MED ORDER — TAMSULOSIN HCL 0.4 MG PO CAPS
0.4000 mg | ORAL_CAPSULE | Freq: Every day | ORAL | Status: DC
  Administered 2019-01-26 – 2019-01-27 (×2): 0.4 mg via ORAL
  Filled 2019-01-26 (×2): qty 1

## 2019-01-26 MED ORDER — PRAVASTATIN SODIUM 40 MG PO TABS: 40.0000 mg | ORAL_TABLET | Freq: Every day | ORAL | Status: DC

## 2019-01-26 MED ORDER — CALCIUM CARBONATE 1500 (600 CA) MG PO TABS
600.0000 mg | ORAL_TABLET | Freq: Every day | ORAL | Status: DC
  Filled 2019-01-26: qty 1

## 2019-01-26 MED ORDER — VITAMIN E 180 MG (400 UNIT) PO CAPS
400.0000 [IU] | ORAL_CAPSULE | Freq: Every evening | ORAL | Status: DC
  Administered 2019-01-26: 400 [IU] via ORAL
  Filled 2019-01-26 (×2): qty 1

## 2019-01-26 MED ORDER — CO Q-10 100 MG PO CAPS: 100.0000 mg | ORAL_CAPSULE | Freq: Every evening | ORAL | Status: DC

## 2019-01-26 MED ORDER — PRIMIDONE 50 MG PO TABS
50.0000 mg | ORAL_TABLET | Freq: Every day | ORAL | Status: DC
  Administered 2019-01-26: 50 mg via ORAL
  Filled 2019-01-26 (×2): qty 1

## 2019-01-26 MED ORDER — FAMOTIDINE 20 MG PO TABS
20.0000 mg | ORAL_TABLET | Freq: Every day | ORAL | Status: DC
  Administered 2019-01-26 – 2019-01-27 (×2): 20 mg via ORAL
  Filled 2019-01-26 (×2): qty 1

## 2019-01-26 MED ORDER — OXYBUTYNIN CHLORIDE 5 MG PO TABS
5.0000 mg | ORAL_TABLET | Freq: Two times a day (BID) | ORAL | Status: DC
  Administered 2019-01-26: 5 mg via ORAL
  Filled 2019-01-26 (×3): qty 1

## 2019-01-26 MED ORDER — VITAMIN A 10000 UNITS PO CAPS
10000.0000 [IU] | ORAL_CAPSULE | Freq: Every evening | ORAL | Status: DC
  Administered 2019-01-26: 10000 [IU] via ORAL
  Filled 2019-01-26 (×2): qty 1

## 2019-01-26 MED ORDER — CARVEDILOL 12.5 MG PO TABS: 6.2500 mg | ORAL_TABLET | Freq: Two times a day (BID) | ORAL | Status: DC

## 2019-01-26 MED ORDER — VITAMIN D 25 MCG (1000 UNIT) PO TABS
2000.0000 [IU] | ORAL_TABLET | Freq: Every day | ORAL | Status: DC
  Administered 2019-01-26: 2000 [IU] via ORAL
  Filled 2019-01-26: qty 2

## 2019-01-26 MED ORDER — ALPRAZOLAM 0.5 MG PO TABS
0.5000 mg | ORAL_TABLET | Freq: Every day | ORAL | Status: DC
  Administered 2019-01-26: 0.5 mg via ORAL
  Filled 2019-01-26: qty 1

## 2019-01-26 MED ORDER — TRAMADOL HCL 50 MG PO TABS: 50.0000 mg | ORAL_TABLET | Freq: Four times a day (QID) | ORAL | Status: DC | PRN

## 2019-01-26 NOTE — ED Notes (Signed)
Pt's daughter requested Roderic Palau (social work @ [employer]) be paged to discuss plan of care with pt's other daughter. Phone number relayed to family to contact SW directly. RN spoke directly with Roderic Palau regarding situation. Roderic Palau awaiting call from family at this time.

## 2019-01-26 NOTE — Progress Notes (Signed)
2nd shift ED CSW received a handoff from the 1st shift [facility] ED CSW.    CSW called [employer] and confirmed that pt's SNF rehab authorization request is currently under review by the medical director of [employer].  Of note: On-call RN with [employer] knows to call CSW #'s [phone] or [phone] with the decision.  CSW will continue to follow for D/C needs.  Anthony [name]. Jessikah Dicker, LCSW, LCAS, CSI Clinical Social Worker Ph: [phone]

## 2019-01-26 NOTE — Progress Notes (Addendum)
CSW received a call from pt's daughter Anthony [name] at ph: [phone] who was with her "attorney" in her attorney's office and was calling to let social work know of the family's concerns.  Per the attorney, who stated she was the family's "[name], family had concerns over first...  1. The pt's medications and concerns that the pt has not received his medications while in the ED and ...  2. Concerns that the there was "rough handling" of the pt when the bedpan was placed and that it "opened up bedsores" on the pt's buttocks when the bed pan was being replaced by staff.  CSW stated CSW would contact RN CM so pt's medications and concerns over the pt's bedsores could be addressed by the proper person.  CSW stated he would contact family to update them as these issues were being addressed.  5:30 PM CSW informed RN CM who will contact pt's RN and RN will alert CN as to pt's family's concerns.  CSW updated pt's daughter's:  1. Anthony [name] at ph: [phone] - CSW spoke by phone.  2. Anthony [name], Anthony [name] at ph: [phone] - CSW called and left as HIPPA-compliant VM.  CSW will continue to follow for D/C needs.  [name]. [name], LCSW, LCAS, CSI Clinical Social Worker Ph: [phone]

## 2019-01-26 NOTE — ED Notes (Signed)
CSW to touch base with [employer] this morning regarding admit process for [facility]. Patient resting comfortably. Updated patient's daughter at bedside.

## 2019-01-26 NOTE — ED Notes (Signed)
Breakfast Tray Ordered. 

## 2019-01-26 NOTE — ED Notes (Signed)
Pt had BM while in bed. Daughter informed staff. RN and NT cleaned pt and redressed bed. Scant amount of bleeding noted on backside. Pt's daughter stated it was from abrasion caused by placement of bedpan earlier in the day.

## 2019-01-26 NOTE — ED Notes (Signed)
Lunch ordered 

## 2019-01-26 NOTE — Progress Notes (Addendum)
1:43pm-CSW followed back up  with [employer] to see if insurance Josem Kaufmann has come in. CSW spoke with Colletta [place] and was informed that at this time they still do not have a decision. They will call CSW back once they have a decision on pt.  8:39am-CSW spoke with [employer] and was informed that Josem Kaufmann is still pending as it came in late yesterday. She expressed that it has to go through review with a medical directors first and then it should be in. CSW was asked to follow back up in a couple of hours to see if Josem Kaufmann has been approved.   CSW spoke with pt's daughter in hallway to give update on where we are with placing pt. CSW informed daughter that we are waiting to see if Josem Kaufmann has come through. CSW advised daughter that CSW would call [employer] to see if insurance approved as soon as the opened at 8-8:30am. Pt's daughter expressed being tired and a little "ill". CSW offered daughter beverage to to wake her up, daughter declined. CSW will follow up with [employer] ASAP and update daughter.     Virgie Dad Berna Gitto, MSW, [place] [facility] Clinical Social Worker [phone]

## 2019-01-26 NOTE — Discharge Planning (Signed)
EDCM spoke with [employer] auth to find the case is still under review.  Will update team when information is available.

## 2019-01-27 DIAGNOSIS — R5381 Other malaise: Secondary | ICD-10-CM | POA: Diagnosis not present

## 2019-01-27 DIAGNOSIS — Z7401 Bed confinement status: Secondary | ICD-10-CM | POA: Diagnosis not present

## 2019-01-27 DIAGNOSIS — M2550 Pain in unspecified joint: Secondary | ICD-10-CM | POA: Diagnosis not present

## 2019-01-27 LAB — CBG MONITORING, ED
Glucose-Capillary: 83 mg/dL (ref 70–99)
Glucose-Capillary: 106 mg/dL — ABNORMAL HIGH (ref 70–99)
Glucose-Capillary: 169 mg/dL — ABNORMAL HIGH (ref 70–99)

## 2019-01-27 MED ORDER — ZINC OXIDE 40 % EX OINT
TOPICAL_OINTMENT | CUTANEOUS | Status: DC | PRN
  Administered 2019-01-27: 20:00:00 via TOPICAL
  Filled 2019-01-27: qty 57

## 2019-01-27 NOTE — Discharge Planning (Signed)
EDCM spoke with pt and daughter, [name] at bedside regarding insurance declining payment for SNF placement.  Daughter states they will pay out of pocket for SNF services and have set up arrangements with Elyse Hsu at [facility] for care in a semi-private room.

## 2019-01-27 NOTE — Progress Notes (Signed)
Report called to Caryl Asp, Therapist, sports at [facility] in [place].

## 2019-01-27 NOTE — ED Notes (Signed)
CSW Jeanette Caprice at bedside. Pt is sitting up eating his meal, pleasant denies pain.

## 2019-01-27 NOTE — ED Notes (Signed)
Meal tray delivered to bedside

## 2019-01-27 NOTE — Discharge Planning (Signed)
EDCM received notification from insurance that they have declined  Authorization for payment for SNF placement.

## 2019-01-27 NOTE — Progress Notes (Addendum)
CSW received a call from pt's daughter who began screaming at the [place] without identifying herself.  Pt's daughter used profanity and repeated 2 or 3 times that "my father better be out of that damn hospital before I get up to that holding area" and proceeded to talk further before CSW was able to successfully request that the pt's daughter identify herself.  CSW was able to identfy that the pt's daughter was angry that the pt had not been transported yet and CSW explained pt is ready for D/C from the hospital's standpoint and that [facility] has been called for some time and that the only thing keeping pt from being D/C'd is that [facility] has not arrived.    CSW attempted to explain that after [facility] is called there is usually a 2 and sometimes a four hour wait for [facility] arrival, especially around 5pm and even more frequently on a Friday night at approx 5pm.  Pt continued to raise voice at social worker about how angry she was that her father was scared to death up there" and CSW stated he was sorry there was a wait and suggested pt can call [facility] for updates, questions or should she have any concerns about arrival time.  Pt's daughter yelled via phone that she was tired of "hearing bullshit" but thanked the CSW by name saying, ":Thank you Roderic Palau" and hung up after further stating, "I'll call [facility]".  CSW called pt's RN who confirmed [facility] was called at approx 3pm and report was called to the ALF at approx 4pm.  CSW will continue to follow for D/C needs.  [name]. Amberlynn Tempesta, LCSW, LCAS, CSI Clinical Social Worker Ph: [phone]

## 2019-01-27 NOTE — ED Notes (Signed)
Lunch ordered 

## 2019-01-27 NOTE — Progress Notes (Signed)
CSW confirmed with [facility] SNF they are able to accept patient with private pay due to insurance declining patient.   Patient is able to go to facility at any time via [facility]. Please call report to [phone].   If possible please fax AVS to [phone].   Kingsley Spittle, LCSW Clinical Social Worker  [facility]  [phone]

## 2019-01-27 NOTE — ED Notes (Signed)
Breakfast tray ordered 

## 2019-01-31 ENCOUNTER — Other Ambulatory Visit: Payer: Self-pay | Admitting: *Deleted

## 2019-01-31 DIAGNOSIS — R531 Weakness: Secondary | ICD-10-CM | POA: Diagnosis not present

## 2019-01-31 DIAGNOSIS — F0390 Unspecified dementia without behavioral disturbance: Secondary | ICD-10-CM | POA: Diagnosis not present

## 2019-01-31 DIAGNOSIS — R627 Adult failure to thrive: Secondary | ICD-10-CM | POA: Diagnosis not present

## 2019-01-31 DIAGNOSIS — N400 Enlarged prostate without lower urinary tract symptoms: Secondary | ICD-10-CM | POA: Diagnosis not present

## 2019-01-31 NOTE — Patient Outreach (Addendum)
[facility]) Care Management  01/31/2019  Anthony [name] 1934-04-13 [id number]   Telephone Screen  Referral Date: 01/18/19 Lorin Glass) received on 01/31/19 Referral Source: Um referral from Benita Gutter Referral Reason: Per Dr Amalia Hailey review "Please see if [facility] CM is following at home as well and if not please request"  Insurance:  [employer]  Last ED visit 10/04/18  Outreach attempt # 1 successful at the home number  His wife answered  Reviewed and addressed referral to [facility] with her  She informed CM that Anthony [name] is not at home anymore She reports he had lived with his daughter since last December but he had becom immobile Mrs Flakes reports Dr Inda Merlin assisted Anthony Godinho daughter to get him to [facility] snf as of 01/27/2019    Plan: [facility] RN CM will close case at this time as patient is now residing at [facility] since 01/27/2019 and is receiving external CM/SW services there  CM unable to find Benita Gutter nor Dr Amalia Hailey to send letter to update them Wife states Dr Inda Merlin is aware that pt is in [facility] L. Lavina Hamman, RN, BSN, [place] Coordinator Office number [phone] Mobile number [phone]  Main [facility] number [phone] Fax number [phone]

## 2019-02-02 DIAGNOSIS — N400 Enlarged prostate without lower urinary tract symptoms: Secondary | ICD-10-CM | POA: Diagnosis not present

## 2019-02-02 DIAGNOSIS — J8489 Other specified interstitial pulmonary diseases: Secondary | ICD-10-CM | POA: Diagnosis not present

## 2019-02-02 DIAGNOSIS — I5022 Chronic systolic (congestive) heart failure: Secondary | ICD-10-CM | POA: Diagnosis not present

## 2019-02-02 DIAGNOSIS — R627 Adult failure to thrive: Secondary | ICD-10-CM | POA: Diagnosis not present

## 2019-02-02 DIAGNOSIS — R531 Weakness: Secondary | ICD-10-CM | POA: Diagnosis not present

## 2019-02-02 DIAGNOSIS — Z9981 Dependence on supplemental oxygen: Secondary | ICD-10-CM | POA: Diagnosis not present

## 2019-02-02 DIAGNOSIS — R0902 Hypoxemia: Secondary | ICD-10-CM | POA: Diagnosis not present

## 2019-02-02 DIAGNOSIS — L97429 Non-pressure chronic ulcer of left heel and midfoot with unspecified severity: Secondary | ICD-10-CM | POA: Diagnosis not present

## 2019-02-02 DIAGNOSIS — A419 Sepsis, unspecified organism: Secondary | ICD-10-CM | POA: Diagnosis not present

## 2019-02-02 DIAGNOSIS — R52 Pain, unspecified: Secondary | ICD-10-CM | POA: Diagnosis not present

## 2019-02-02 DIAGNOSIS — B370 Candidal stomatitis: Secondary | ICD-10-CM | POA: Diagnosis not present

## 2019-02-02 DIAGNOSIS — N3281 Overactive bladder: Secondary | ICD-10-CM | POA: Diagnosis not present

## 2019-02-02 DIAGNOSIS — F0390 Unspecified dementia without behavioral disturbance: Secondary | ICD-10-CM | POA: Diagnosis not present

## 2019-02-02 DIAGNOSIS — M069 Rheumatoid arthritis, unspecified: Secondary | ICD-10-CM | POA: Diagnosis not present

## 2019-02-03 DIAGNOSIS — R627 Adult failure to thrive: Secondary | ICD-10-CM | POA: Diagnosis not present

## 2019-02-03 DIAGNOSIS — R531 Weakness: Secondary | ICD-10-CM | POA: Diagnosis not present

## 2019-02-03 DIAGNOSIS — L97429 Non-pressure chronic ulcer of left heel and midfoot with unspecified severity: Secondary | ICD-10-CM | POA: Diagnosis not present

## 2019-02-03 DIAGNOSIS — F0390 Unspecified dementia without behavioral disturbance: Secondary | ICD-10-CM | POA: Diagnosis not present

## 2019-02-07 ENCOUNTER — Other Ambulatory Visit: Payer: Self-pay | Admitting: *Deleted

## 2019-02-07 ENCOUNTER — Ambulatory Visit: Payer: PPO | Admitting: Primary Care

## 2019-02-07 NOTE — Patient Outreach (Signed)
[facility]) Care Management  02/07/2019  Anthony [name] [birth year] [id number]  Per PatientPing this patient admitted to skilled facility, noted that they are a [facility] patient.  This patient will be followed by [facility] in the community. [facility] will provide full case management services.  [facility] care management services are not appropriate at this time.   Plan to sign off. Will collaborate with [facility] as indicated.   Royetta Crochet. Laymond Purser, MSN, RN, [employer] , [place] [phone]) Business Cell  [phone]) Toll Free Office

## 2019-02-10 DIAGNOSIS — R627 Adult failure to thrive: Secondary | ICD-10-CM | POA: Diagnosis not present

## 2019-02-10 DIAGNOSIS — R531 Weakness: Secondary | ICD-10-CM | POA: Diagnosis not present

## 2019-02-10 DIAGNOSIS — F0390 Unspecified dementia without behavioral disturbance: Secondary | ICD-10-CM | POA: Diagnosis not present

## 2019-02-10 DIAGNOSIS — L97429 Non-pressure chronic ulcer of left heel and midfoot with unspecified severity: Secondary | ICD-10-CM | POA: Diagnosis not present

## 2019-02-17 ENCOUNTER — Encounter: Payer: Self-pay | Admitting: Nurse Practitioner

## 2019-02-17 DIAGNOSIS — R627 Adult failure to thrive: Secondary | ICD-10-CM | POA: Diagnosis not present

## 2019-02-17 DIAGNOSIS — R531 Weakness: Secondary | ICD-10-CM | POA: Diagnosis not present

## 2019-02-17 DIAGNOSIS — L97429 Non-pressure chronic ulcer of left heel and midfoot with unspecified severity: Secondary | ICD-10-CM | POA: Diagnosis not present

## 2019-02-17 DIAGNOSIS — F0390 Unspecified dementia without behavioral disturbance: Secondary | ICD-10-CM | POA: Diagnosis not present

## 2019-02-21 ENCOUNTER — Telehealth: Payer: Self-pay | Admitting: Nurse Practitioner

## 2019-02-21 NOTE — Telephone Encounter (Signed)
Phone call with patient's daughter - he has been placed in SNF. Unclear disposition. No current cardiac issues. The facility is on lock down as well due to COVID 19.   We will be cancelling his upcoming visit with [place] and will be available as needed.   Burtis Junes, RN, [place] [address] [place], [place]  [phone]

## 2019-02-27 DIAGNOSIS — B349 Viral infection, unspecified: Secondary | ICD-10-CM | POA: Diagnosis not present

## 2019-02-27 DIAGNOSIS — M6281 Muscle weakness (generalized): Secondary | ICD-10-CM | POA: Diagnosis not present

## 2019-02-27 DIAGNOSIS — R509 Fever, unspecified: Secondary | ICD-10-CM | POA: Diagnosis not present

## 2019-02-27 DIAGNOSIS — D649 Anemia, unspecified: Secondary | ICD-10-CM | POA: Diagnosis not present

## 2019-02-27 DIAGNOSIS — R319 Hematuria, unspecified: Secondary | ICD-10-CM | POA: Diagnosis not present

## 2019-02-28 ENCOUNTER — Ambulatory Visit: Payer: PPO | Admitting: Nurse Practitioner

## 2019-02-28 DIAGNOSIS — L97429 Non-pressure chronic ulcer of left heel and midfoot with unspecified severity: Secondary | ICD-10-CM | POA: Diagnosis not present

## 2019-02-28 DIAGNOSIS — K5730 Diverticulosis of large intestine without perforation or abscess without bleeding: Secondary | ICD-10-CM | POA: Diagnosis not present

## 2019-02-28 DIAGNOSIS — M5126 Other intervertebral disc displacement, lumbar region: Secondary | ICD-10-CM | POA: Diagnosis not present

## 2019-02-28 DIAGNOSIS — I517 Cardiomegaly: Secondary | ICD-10-CM | POA: Diagnosis not present

## 2019-02-28 DIAGNOSIS — R627 Adult failure to thrive: Secondary | ICD-10-CM | POA: Diagnosis not present

## 2019-02-28 DIAGNOSIS — N400 Enlarged prostate without lower urinary tract symptoms: Secondary | ICD-10-CM | POA: Diagnosis not present

## 2019-02-28 DIAGNOSIS — I2510 Atherosclerotic heart disease of native coronary artery without angina pectoris: Secondary | ICD-10-CM | POA: Diagnosis not present

## 2019-02-28 DIAGNOSIS — R918 Other nonspecific abnormal finding of lung field: Secondary | ICD-10-CM | POA: Diagnosis not present

## 2019-02-28 DIAGNOSIS — J984 Other disorders of lung: Secondary | ICD-10-CM | POA: Diagnosis not present

## 2019-02-28 DIAGNOSIS — M25552 Pain in left hip: Secondary | ICD-10-CM | POA: Diagnosis not present

## 2019-02-28 DIAGNOSIS — I10 Essential (primary) hypertension: Secondary | ICD-10-CM | POA: Diagnosis not present

## 2019-03-01 ENCOUNTER — Emergency Department (HOSPITAL_COMMUNITY): Payer: PPO

## 2019-03-01 ENCOUNTER — Telehealth (INDEPENDENT_AMBULATORY_CARE_PROVIDER_SITE_OTHER): Payer: Self-pay | Admitting: Orthopaedic Surgery

## 2019-03-01 ENCOUNTER — Inpatient Hospital Stay (HOSPITAL_COMMUNITY)
Admission: EM | Admit: 2019-03-01 | Discharge: 2019-03-08 | DRG: 469 | Disposition: A | Discharge status: Skilled Nursing Facility | Payer: PPO | Attending: Internal Medicine | Admitting: Internal Medicine

## 2019-03-01 ENCOUNTER — Observation Stay (HOSPITAL_COMMUNITY): Payer: PPO

## 2019-03-01 ENCOUNTER — Other Ambulatory Visit: Payer: Self-pay

## 2019-03-01 DIAGNOSIS — Z87891 Personal history of nicotine dependence: Secondary | ICD-10-CM

## 2019-03-01 DIAGNOSIS — Z66 Do not resuscitate: Secondary | ICD-10-CM | POA: Diagnosis not present

## 2019-03-01 DIAGNOSIS — A419 Sepsis, unspecified organism: Secondary | ICD-10-CM | POA: Diagnosis not present

## 2019-03-01 DIAGNOSIS — K219 Gastro-esophageal reflux disease without esophagitis: Secondary | ICD-10-CM | POA: Diagnosis not present

## 2019-03-01 DIAGNOSIS — E1169 Type 2 diabetes mellitus with other specified complication: Secondary | ICD-10-CM | POA: Diagnosis not present

## 2019-03-01 DIAGNOSIS — E871 Hypo-osmolality and hyponatremia: Secondary | ICD-10-CM | POA: Diagnosis present

## 2019-03-01 DIAGNOSIS — I2510 Atherosclerotic heart disease of native coronary artery without angina pectoris: Secondary | ICD-10-CM | POA: Diagnosis present

## 2019-03-01 DIAGNOSIS — K5090 Crohn's disease, unspecified, without complications: Secondary | ICD-10-CM | POA: Diagnosis present

## 2019-03-01 DIAGNOSIS — I110 Hypertensive heart disease with heart failure: Secondary | ICD-10-CM | POA: Diagnosis present

## 2019-03-01 DIAGNOSIS — F0390 Unspecified dementia without behavioral disturbance: Secondary | ICD-10-CM | POA: Diagnosis not present

## 2019-03-01 DIAGNOSIS — Z96642 Presence of left artificial hip joint: Secondary | ICD-10-CM | POA: Diagnosis not present

## 2019-03-01 DIAGNOSIS — M2550 Pain in unspecified joint: Secondary | ICD-10-CM | POA: Diagnosis not present

## 2019-03-01 DIAGNOSIS — D62 Acute posthemorrhagic anemia: Secondary | ICD-10-CM | POA: Diagnosis not present

## 2019-03-01 DIAGNOSIS — N400 Enlarged prostate without lower urinary tract symptoms: Secondary | ICD-10-CM | POA: Diagnosis not present

## 2019-03-01 DIAGNOSIS — F05 Delirium due to known physiological condition: Secondary | ICD-10-CM | POA: Diagnosis not present

## 2019-03-01 DIAGNOSIS — K5010 Crohn's disease of large intestine without complications: Secondary | ICD-10-CM | POA: Diagnosis not present

## 2019-03-01 DIAGNOSIS — T8149XA Infection following a procedure, other surgical site, initial encounter: Secondary | ICD-10-CM | POA: Diagnosis not present

## 2019-03-01 DIAGNOSIS — J189 Pneumonia, unspecified organism: Secondary | ICD-10-CM | POA: Diagnosis not present

## 2019-03-01 DIAGNOSIS — B370 Candidal stomatitis: Secondary | ICD-10-CM | POA: Diagnosis not present

## 2019-03-01 DIAGNOSIS — Z9889 Other specified postprocedural states: Secondary | ICD-10-CM

## 2019-03-01 DIAGNOSIS — I248 Other forms of acute ischemic heart disease: Secondary | ICD-10-CM | POA: Diagnosis not present

## 2019-03-01 DIAGNOSIS — G4730 Sleep apnea, unspecified: Secondary | ICD-10-CM | POA: Diagnosis present

## 2019-03-01 DIAGNOSIS — D696 Thrombocytopenia, unspecified: Secondary | ICD-10-CM | POA: Diagnosis present

## 2019-03-01 DIAGNOSIS — G20 Parkinson's disease: Secondary | ICD-10-CM | POA: Diagnosis not present

## 2019-03-01 DIAGNOSIS — I10 Essential (primary) hypertension: Secondary | ICD-10-CM | POA: Diagnosis not present

## 2019-03-01 DIAGNOSIS — I252 Old myocardial infarction: Secondary | ICD-10-CM

## 2019-03-01 DIAGNOSIS — D899 Disorder involving the immune mechanism, unspecified: Secondary | ICD-10-CM | POA: Diagnosis not present

## 2019-03-01 DIAGNOSIS — S72002A Fracture of unspecified part of neck of left femur, initial encounter for closed fracture: Secondary | ICD-10-CM | POA: Diagnosis not present

## 2019-03-01 DIAGNOSIS — I5022 Chronic systolic (congestive) heart failure: Secondary | ICD-10-CM | POA: Diagnosis present

## 2019-03-01 DIAGNOSIS — L89312 Pressure ulcer of right buttock, stage 2: Secondary | ICD-10-CM | POA: Diagnosis present

## 2019-03-01 DIAGNOSIS — M069 Rheumatoid arthritis, unspecified: Secondary | ICD-10-CM | POA: Diagnosis not present

## 2019-03-01 DIAGNOSIS — Y95 Nosocomial condition: Secondary | ICD-10-CM | POA: Diagnosis present

## 2019-03-01 DIAGNOSIS — F330 Major depressive disorder, recurrent, mild: Secondary | ICD-10-CM | POA: Diagnosis not present

## 2019-03-01 DIAGNOSIS — Z7401 Bed confinement status: Secondary | ICD-10-CM | POA: Diagnosis not present

## 2019-03-01 DIAGNOSIS — Z794 Long term (current) use of insulin: Secondary | ICD-10-CM

## 2019-03-01 DIAGNOSIS — I959 Hypotension, unspecified: Secondary | ICD-10-CM | POA: Diagnosis not present

## 2019-03-01 DIAGNOSIS — J9601 Acute respiratory failure with hypoxia: Secondary | ICD-10-CM | POA: Diagnosis present

## 2019-03-01 DIAGNOSIS — R51 Headache: Secondary | ICD-10-CM | POA: Diagnosis not present

## 2019-03-01 DIAGNOSIS — W19XXXA Unspecified fall, initial encounter: Secondary | ICD-10-CM

## 2019-03-01 DIAGNOSIS — N3281 Overactive bladder: Secondary | ICD-10-CM | POA: Diagnosis present

## 2019-03-01 DIAGNOSIS — E782 Mixed hyperlipidemia: Secondary | ICD-10-CM | POA: Diagnosis not present

## 2019-03-01 DIAGNOSIS — E119 Type 2 diabetes mellitus without complications: Secondary | ICD-10-CM | POA: Diagnosis present

## 2019-03-01 DIAGNOSIS — S72002D Fracture of unspecified part of neck of left femur, subsequent encounter for closed fracture with routine healing: Secondary | ICD-10-CM | POA: Diagnosis not present

## 2019-03-01 DIAGNOSIS — S72042A Displaced fracture of base of neck of left femur, initial encounter for closed fracture: Secondary | ICD-10-CM

## 2019-03-01 DIAGNOSIS — L89302 Pressure ulcer of unspecified buttock, stage 2: Secondary | ICD-10-CM | POA: Diagnosis not present

## 2019-03-01 DIAGNOSIS — C61 Malignant neoplasm of prostate: Secondary | ICD-10-CM | POA: Diagnosis not present

## 2019-03-01 DIAGNOSIS — Z79899 Other long term (current) drug therapy: Secondary | ICD-10-CM

## 2019-03-01 DIAGNOSIS — E785 Hyperlipidemia, unspecified: Secondary | ICD-10-CM | POA: Diagnosis present

## 2019-03-01 DIAGNOSIS — S72009A Fracture of unspecified part of neck of unspecified femur, initial encounter for closed fracture: Secondary | ICD-10-CM

## 2019-03-01 DIAGNOSIS — S72012D Unspecified intracapsular fracture of left femur, subsequent encounter for closed fracture with routine healing: Secondary | ICD-10-CM | POA: Diagnosis not present

## 2019-03-01 DIAGNOSIS — Z20828 Contact with and (suspected) exposure to other viral communicable diseases: Secondary | ICD-10-CM | POA: Diagnosis present

## 2019-03-01 DIAGNOSIS — R278 Other lack of coordination: Secondary | ICD-10-CM | POA: Diagnosis not present

## 2019-03-01 DIAGNOSIS — R6889 Other general symptoms and signs: Secondary | ICD-10-CM

## 2019-03-01 DIAGNOSIS — R52 Pain, unspecified: Secondary | ICD-10-CM | POA: Diagnosis not present

## 2019-03-01 DIAGNOSIS — L89322 Pressure ulcer of left buttock, stage 2: Secondary | ICD-10-CM | POA: Diagnosis present

## 2019-03-01 DIAGNOSIS — T148XXA Other injury of unspecified body region, initial encounter: Secondary | ICD-10-CM

## 2019-03-01 DIAGNOSIS — M6281 Muscle weakness (generalized): Secondary | ICD-10-CM | POA: Diagnosis not present

## 2019-03-01 DIAGNOSIS — Z7952 Long term (current) use of systemic steroids: Secondary | ICD-10-CM

## 2019-03-01 DIAGNOSIS — Z20822 Contact with and (suspected) exposure to covid-19: Secondary | ICD-10-CM

## 2019-03-01 DIAGNOSIS — E669 Obesity, unspecified: Secondary | ICD-10-CM | POA: Diagnosis not present

## 2019-03-01 DIAGNOSIS — R5383 Other fatigue: Secondary | ICD-10-CM | POA: Diagnosis not present

## 2019-03-01 DIAGNOSIS — R0902 Hypoxemia: Secondary | ICD-10-CM

## 2019-03-01 DIAGNOSIS — J9811 Atelectasis: Secondary | ICD-10-CM | POA: Diagnosis not present

## 2019-03-01 DIAGNOSIS — R627 Adult failure to thrive: Secondary | ICD-10-CM | POA: Diagnosis not present

## 2019-03-01 DIAGNOSIS — Z9181 History of falling: Secondary | ICD-10-CM | POA: Diagnosis not present

## 2019-03-01 DIAGNOSIS — Y92129 Unspecified place in nursing home as the place of occurrence of the external cause: Secondary | ICD-10-CM

## 2019-03-01 DIAGNOSIS — R918 Other nonspecific abnormal finding of lung field: Secondary | ICD-10-CM | POA: Diagnosis not present

## 2019-03-01 DIAGNOSIS — E876 Hypokalemia: Secondary | ICD-10-CM | POA: Diagnosis not present

## 2019-03-01 DIAGNOSIS — R21 Rash and other nonspecific skin eruption: Secondary | ICD-10-CM | POA: Diagnosis not present

## 2019-03-01 DIAGNOSIS — R5381 Other malaise: Secondary | ICD-10-CM | POA: Diagnosis not present

## 2019-03-01 DIAGNOSIS — L8990 Pressure ulcer of unspecified site, unspecified stage: Secondary | ICD-10-CM

## 2019-03-01 DIAGNOSIS — J8489 Other specified interstitial pulmonary diseases: Secondary | ICD-10-CM | POA: Diagnosis not present

## 2019-03-01 DIAGNOSIS — S72012A Unspecified intracapsular fracture of left femur, initial encounter for closed fracture: Secondary | ICD-10-CM | POA: Diagnosis not present

## 2019-03-01 DIAGNOSIS — Z9981 Dependence on supplemental oxygen: Secondary | ICD-10-CM | POA: Diagnosis not present

## 2019-03-01 DIAGNOSIS — S199XXA Unspecified injury of neck, initial encounter: Secondary | ICD-10-CM | POA: Diagnosis not present

## 2019-03-01 DIAGNOSIS — R2689 Other abnormalities of gait and mobility: Secondary | ICD-10-CM | POA: Diagnosis not present

## 2019-03-01 LAB — COMPREHENSIVE METABOLIC PANEL
ALT: 16 U/L (ref 0–44)
AST: 34 U/L (ref 15–41)
Albumin: 2.7 g/dL — ABNORMAL LOW (ref 3.5–5.0)
Alkaline Phosphatase: 64 U/L (ref 38–126)
Anion gap: 10 (ref 5–15)
BUN: 15 mg/dL (ref 8–23)
CO2: 21 mmol/L — ABNORMAL LOW (ref 22–32)
Calcium: 8.5 mg/dL — ABNORMAL LOW (ref 8.9–10.3)
Chloride: 95 mmol/L — ABNORMAL LOW (ref 98–111)
Creatinine, Ser: 1.08 mg/dL (ref 0.61–1.24)
GFR calc Af Amer: >60 mL/min (ref >60)
GFR calc non Af Amer: >60 mL/min (ref >60)
Glucose, Bld: 104 mg/dL — ABNORMAL HIGH (ref 70–99)
Potassium: 4.1 mmol/L (ref 3.5–5.1)
Sodium: 126 mmol/L — ABNORMAL LOW (ref 135–145)
Total Bilirubin: 1.7 mg/dL — ABNORMAL HIGH (ref 0.3–1.2)
Total Protein: 5.9 g/dL — ABNORMAL LOW (ref 6.5–8.1)

## 2019-03-01 LAB — RESPIRATORY PANEL BY PCR

## 2019-03-01 LAB — PROTIME-INR
INR: 1.1 (ref 0.8–1.2)
Prothrombin Time: 14.5 seconds (ref 11.4–15.2)

## 2019-03-01 LAB — TROPONIN I
Troponin I: 0.06 ng/mL (ref <0.03)
Troponin I: 0.05 ng/mL (ref <0.03)
Troponin I: 0.05 ng/mL (ref <0.03)

## 2019-03-01 LAB — BRAIN NATRIURETIC PEPTIDE: B Natriuretic Peptide: 1631.3 pg/mL — ABNORMAL HIGH (ref 0.0–100.0)

## 2019-03-01 LAB — TYPE AND SCREEN
ABO/RH(D): B POS
Antibody Screen: NEGATIVE

## 2019-03-01 LAB — GLUCOSE, CAPILLARY
Glucose-Capillary: 123 mg/dL — ABNORMAL HIGH (ref 70–99)
Glucose-Capillary: 159 mg/dL — ABNORMAL HIGH (ref 70–99)

## 2019-03-01 LAB — HEPATIC FUNCTION PANEL
ALT: 18 U/L (ref 0–44)
AST: 36 U/L (ref 15–41)
Albumin: 2.6 g/dL — ABNORMAL LOW (ref 3.5–5.0)
Alkaline Phosphatase: 56 U/L (ref 38–126)
Bilirubin, Direct: 0.5 mg/dL — ABNORMAL HIGH (ref 0.0–0.2)
Indirect Bilirubin: 1.4 mg/dL — ABNORMAL HIGH (ref 0.3–0.9)
Total Bilirubin: 1.9 mg/dL — ABNORMAL HIGH (ref 0.3–1.2)
Total Protein: 6 g/dL — ABNORMAL LOW (ref 6.5–8.1)

## 2019-03-01 LAB — I-STAT TROPONIN, ED: Troponin i, poc: 0.09 ng/mL (ref 0.00–0.08)

## 2019-03-01 LAB — CBC WITH DIFFERENTIAL/PLATELET
Abs Immature Granulocytes: 0.09 10*3/uL — ABNORMAL HIGH (ref 0.00–0.07)
Basophils Absolute: 0 10*3/uL (ref 0.0–0.1)
Basophils Relative: 0 %
Eosinophils Absolute: 0.2 10*3/uL (ref 0.0–0.5)
Eosinophils Relative: 1 %
HCT: 43 % (ref 39.0–52.0)
Hemoglobin: 14.2 g/dL (ref 13.0–17.0)
Immature Granulocytes: 1 %
Lymphocytes Relative: 6 %
Lymphs Abs: 0.8 10*3/uL (ref 0.7–4.0)
MCH: 28.2 pg (ref 26.0–34.0)
MCHC: 33 g/dL (ref 30.0–36.0)
MCV: 85.3 fL (ref 80.0–100.0)
Monocytes Absolute: 0.5 10*3/uL (ref 0.1–1.0)
Monocytes Relative: 4 %
Neutro Abs: 11.6 10*3/uL — ABNORMAL HIGH (ref 1.7–7.7)
Neutrophils Relative %: 88 %
Platelets: 144 10*3/uL — ABNORMAL LOW (ref 150–400)
RBC: 5.04 MIL/uL (ref 4.22–5.81)
RDW: 14.6 % (ref 11.5–15.5)
WBC: 13.1 10*3/uL — ABNORMAL HIGH (ref 4.0–10.5)
nRBC: 0 % (ref 0.0–0.2)

## 2019-03-01 LAB — BASIC METABOLIC PANEL
Anion gap: 12 (ref 5–15)
BUN: 16 mg/dL (ref 8–23)
CO2: 25 mmol/L (ref 22–32)
Calcium: 8.5 mg/dL — ABNORMAL LOW (ref 8.9–10.3)
Chloride: 91 mmol/L — ABNORMAL LOW (ref 98–111)
Creatinine, Ser: 1.06 mg/dL (ref 0.61–1.24)
GFR calc Af Amer: >60 mL/min (ref >60)
GFR calc non Af Amer: >60 mL/min (ref >60)
Glucose, Bld: 103 mg/dL — ABNORMAL HIGH (ref 70–99)
Potassium: 4.1 mmol/L (ref 3.5–5.1)
Sodium: 128 mmol/L — ABNORMAL LOW (ref 135–145)

## 2019-03-01 LAB — CBC
HCT: 43.6 % (ref 39.0–52.0)
Hemoglobin: 14.3 g/dL (ref 13.0–17.0)
MCH: 28.3 pg (ref 26.0–34.0)
MCHC: 32.8 g/dL (ref 30.0–36.0)
MCV: 86.2 fL (ref 80.0–100.0)
Platelets: 128 10*3/uL — ABNORMAL LOW (ref 150–400)
RBC: 5.06 MIL/uL (ref 4.22–5.81)
RDW: 14.7 % (ref 11.5–15.5)
WBC: 12.3 10*3/uL — ABNORMAL HIGH (ref 4.0–10.5)
nRBC: 0 % (ref 0.0–0.2)

## 2019-03-01 LAB — LACTIC ACID, PLASMA: Lactic Acid, Venous: 2.1 mmol/L (ref 0.5–1.9)

## 2019-03-01 LAB — OSMOLALITY: Osmolality: 273 mOsm/kg — ABNORMAL LOW (ref 275–295)

## 2019-03-01 LAB — FERRITIN: Ferritin: 399 ng/mL — ABNORMAL HIGH (ref 24–336)

## 2019-03-01 LAB — PROCALCITONIN: Procalcitonin: 0.89 ng/mL

## 2019-03-01 LAB — CBG MONITORING, ED: Glucose-Capillary: 94 mg/dL (ref 70–99)

## 2019-03-01 LAB — ABO/RH: ABO/RH(D): B POS

## 2019-03-01 LAB — C-REACTIVE PROTEIN: CRP: 22.1 mg/dL — ABNORMAL HIGH (ref <1.0)

## 2019-03-01 LAB — SEDIMENTATION RATE: Sed Rate: 27 mm/hr — ABNORMAL HIGH (ref 0–16)

## 2019-03-01 LAB — LACTATE DEHYDROGENASE: LDH: 313 U/L — ABNORMAL HIGH (ref 98–192)

## 2019-03-01 MED ORDER — HEPARIN SODIUM (PORCINE) 5000 UNIT/ML IJ SOLN
5000.0000 [IU] | Freq: Three times a day (TID) | INTRAMUSCULAR | Status: AC
  Administered 2019-03-01 – 2019-03-03 (×7): 5000 [IU] via SUBCUTANEOUS
  Filled 2019-03-01 (×8): qty 1

## 2019-03-01 MED ORDER — VITAMIN D 25 MCG (1000 UNIT) PO TABS
2000.0000 [IU] | ORAL_TABLET | Freq: Every day | ORAL | Status: DC
  Administered 2019-03-01 – 2019-03-08 (×7): 2000 [IU] via ORAL
  Filled 2019-03-01 (×7): qty 2

## 2019-03-01 MED ORDER — PIPERACILLIN-TAZOBACTAM 3.375 G IVPB
3.3750 g | Freq: Three times a day (TID) | INTRAVENOUS | Status: DC
  Administered 2019-03-01 – 2019-03-02 (×3): 3.375 g via INTRAVENOUS
  Filled 2019-03-01 (×4): qty 50

## 2019-03-01 MED ORDER — PRIMIDONE 50 MG PO TABS
50.0000 mg | ORAL_TABLET | Freq: Every day | ORAL | Status: DC
  Administered 2019-03-01 – 2019-03-07 (×6): 50 mg via ORAL
  Filled 2019-03-01 (×8): qty 1

## 2019-03-01 MED ORDER — IOPAMIDOL (ISOVUE-370) INJECTION 76%
75.0000 mL | Freq: Once | INTRAVENOUS | Status: AC | PRN
  Administered 2019-03-01: 75 mL via INTRAVENOUS

## 2019-03-01 MED ORDER — LORAZEPAM 0.5 MG PO TABS: 0.5000 mg | ORAL_TABLET | Freq: Every evening | ORAL | Status: DC

## 2019-03-01 MED ORDER — VITAMIN E 180 MG (400 UNIT) PO CAPS: 400.0000 [IU] | ORAL_CAPSULE | Freq: Every day | ORAL | Status: DC

## 2019-03-01 MED ORDER — SODIUM CHLORIDE 0.9 % IV SOLN
INTRAVENOUS | Status: DC
  Administered 2019-03-01: 05:00:00 via INTRAVENOUS

## 2019-03-01 MED ORDER — OXYBUTYNIN CHLORIDE 5 MG PO TABS
5.0000 mg | ORAL_TABLET | Freq: Two times a day (BID) | ORAL | Status: DC
  Administered 2019-03-01 – 2019-03-08 (×14): 5 mg via ORAL
  Filled 2019-03-01 (×14): qty 1

## 2019-03-01 MED ORDER — VITAMIN B-12 1000 MCG PO TABS: 500.0000 ug | ORAL_TABLET | Freq: Every evening | ORAL | Status: DC

## 2019-03-01 MED ORDER — MORPHINE SULFATE (PF) 2 MG/ML IV SOLN
1.0000 mg | INTRAVENOUS | Status: DC | PRN
  Administered 2019-03-01 (×3): 1 mg via INTRAVENOUS
  Filled 2019-03-01 (×3): qty 1

## 2019-03-01 MED ORDER — TAMSULOSIN HCL 0.4 MG PO CAPS
0.4000 mg | ORAL_CAPSULE | Freq: Every day | ORAL | Status: DC
  Administered 2019-03-01 – 2019-03-07 (×7): 0.4 mg via ORAL
  Filled 2019-03-01 (×7): qty 1

## 2019-03-01 MED ORDER — VITAMIN A 10000 UNITS PO CAPS: 10000.0000 [IU] | ORAL_CAPSULE | Freq: Every day | ORAL | Status: DC

## 2019-03-01 MED ORDER — INSULIN ASPART 100 UNIT/ML ~~LOC~~ SOLN
0.0000 [IU] | Freq: Three times a day (TID) | SUBCUTANEOUS | Status: DC
  Administered 2019-03-01 – 2019-03-03 (×2): 1 [IU] via SUBCUTANEOUS
  Administered 2019-03-04: 18:00:00 2 [IU] via SUBCUTANEOUS
  Administered 2019-03-05: 08:00:00 1 [IU] via SUBCUTANEOUS
  Administered 2019-03-05 (×2): 2 [IU] via SUBCUTANEOUS
  Administered 2019-03-06 – 2019-03-07 (×3): 1 [IU] via SUBCUTANEOUS
  Administered 2019-03-08: 2 [IU] via SUBCUTANEOUS

## 2019-03-01 MED ORDER — LORAZEPAM 0.5 MG PO TABS
0.5000 mg | ORAL_TABLET | Freq: Every evening | ORAL | Status: DC | PRN
  Administered 2019-03-03: 0.5 mg via ORAL
  Filled 2019-03-01: qty 1

## 2019-03-01 MED ORDER — PIPERACILLIN-TAZOBACTAM 3.375 G IVPB 30 MIN
3.3750 g | Freq: Once | INTRAVENOUS | Status: AC
  Administered 2019-03-01: 05:00:00 3.375 g via INTRAVENOUS
  Filled 2019-03-01: qty 50

## 2019-03-01 MED ORDER — ACETAMINOPHEN 325 MG PO TABS
650.0000 mg | ORAL_TABLET | Freq: Four times a day (QID) | ORAL | Status: DC | PRN
  Administered 2019-03-03: 650 mg via ORAL
  Filled 2019-03-01: qty 2

## 2019-03-01 MED ORDER — VANCOMYCIN HCL 10 G IV SOLR
1500.0000 mg | INTRAVENOUS | Status: DC
  Administered 2019-03-02: 06:00:00 1500 mg via INTRAVENOUS
  Filled 2019-03-01 (×2): qty 1500

## 2019-03-01 MED ORDER — MORPHINE SULFATE (PF) 2 MG/ML IV SOLN
1.0000 mg | INTRAVENOUS | Status: DC | PRN
  Administered 2019-03-01 (×2): 1 mg via INTRAVENOUS
  Administered 2019-03-02: 2 mg via INTRAVENOUS
  Administered 2019-03-02: 1 mg via INTRAVENOUS
  Administered 2019-03-03: 21:00:00 2 mg via INTRAVENOUS
  Filled 2019-03-01 (×7): qty 1

## 2019-03-01 MED ORDER — PANTOPRAZOLE SODIUM 40 MG PO TBEC
40.0000 mg | DELAYED_RELEASE_TABLET | Freq: Every day | ORAL | Status: DC
  Administered 2019-03-01 – 2019-03-08 (×7): 40 mg via ORAL
  Filled 2019-03-01 (×7): qty 1

## 2019-03-01 MED ORDER — SODIUM CHLORIDE 0.9 % IV SOLN: INTRAVENOUS | Status: DC

## 2019-03-01 MED ORDER — VANCOMYCIN HCL 10 G IV SOLR
1500.0000 mg | Freq: Once | INTRAVENOUS | Status: AC
  Administered 2019-03-01: 1500 mg via INTRAVENOUS
  Filled 2019-03-01: qty 1500

## 2019-03-01 MED ORDER — MAGNESIUM OXIDE 400 (241.3 MG) MG PO TABS
200.0000 mg | ORAL_TABLET | Freq: Every day | ORAL | Status: DC
  Administered 2019-03-01 – 2019-03-03 (×3): 200 mg via ORAL
  Filled 2019-03-01 (×3): qty 1

## 2019-03-01 MED ORDER — PRAVASTATIN SODIUM 40 MG PO TABS
40.0000 mg | ORAL_TABLET | Freq: Every day | ORAL | Status: DC
  Administered 2019-03-01 – 2019-03-07 (×7): 40 mg via ORAL
  Filled 2019-03-01 (×7): qty 1

## 2019-03-01 NOTE — H&P (Signed)
History and Physical    CARL BUTNER QMV:[id number] DOB: 12-16-1933 DOA: 03/01/2019  PCP: Josetta Huddle, MD Patient coming from: [facility] nursing home  Chief Complaint: Left hip fracture, fever, hypoxia  HPI: Anthony [name] is a 85 y.o. male with medical history significant of rheumatoid arthritis, CAD, Crohn's disease, insulin-dependent diabetes mellitus, hypertension, hyperlipidemia, systolic congestive heart failure presenting to the hospital via EMS for eve.uation of left hip fracture found on x-ray yesterday.  Per ED report, patient had a fall at the nursing home 2 days ago and x-ray was ordered which resulted yesterday revealing left hip fracture.  Patient had a fever on April 6 which improved on April 7.  Had leukocytosis of 22,000 on April 6 but urine did not appear infected.  He was started on Rocephin at his nursing home.  Patient does not know why he is here at the hospital.  Denies having any complaints.  He does not recall falling.  Denies having any pain in his hip.  Denies any fevers, chills, chest pain, shortness of breath, cough, nausea, vomiting, abdominal pain, or dysuria.  No additional history could be obtained from him.  ED Course: Febrile, tachycardic, and tachypneic.  Found to be hypoxic by EMS with sats in the upper 80s.  Review of Systems: As per HPI otherwise 10 point review of systems negative.  Past Medical History:  Diagnosis Date   Arthritis    rheumatoid-  Dr Barkley Boards   CHF (congestive heart failure) ([place])    chronic systolic heart failure per Dr [name] [facility] note 09/23/11.   EKG 4/12, stress test  and cath from 8/12 on chart.  Clearance with LOV Dr [name] on chart   Coronary artery disease    Crohn disease ([place])    Dysrhythmia    nonsustained,asymptomatic VT per Dr [name] office note  resulting in cath   GERD (gastroesophageal reflux disease)    Heart attack ([place]) 1996, 06/2011   Hyperlipidemia    Hypertension    Pneumonia 1993    Prostate troubles    states take alternating meds for bladder/prostate control- "age thing"   Sleep apnea    sleep study years ago- has apnea but did not qualify for CPAP    Past Surgical History:  Procedure Laterality Date   CARDIAC CATHETERIZATION     1996/ 2012 report on chart   COLONOSCOPY     INGUINAL HERNIA REPAIR  11/04/2011   Procedure: HERNIA REPAIR INGUINAL ADULT;  Surgeon: Pedro Earls, MD;  Location: [facility];  Service: General;  Laterality: Right;  Right Inguinal Hernia Repair with Mesh   KNEE ARTHROSCOPY Right 1976   MENISCUS DEBRIDEMENT Left 1996   TRIGGER FINGER RELEASE Right 28/41/3244   UMBILICAL HERNIA REPAIR  2009   WRIST SURGERY Left 04/25/91     reports that he quit smoking about 48 years ago. His smoking use included cigarettes. He has a 60.00 pack-year smoking history. He has never used smokeless tobacco. He reports that he does not drink alcohol or use drugs.  No Known Allergies  Family History  Problem Relation Age of Onset   Heart disease Mother    Rheum arthritis Mother    Emphysema Father     Prior to Admission medications   Medication Sig Start Date End Date Taking? Authorizing Provider  acetaminophen (TYLENOL) 325 MG tablet Take 650 mg by mouth every 6 (six) hours as needed for mild pain or fever.   Yes [provider]  Cholecalciferol (VITAMIN  D3) 50 MCG (2000 UT) capsule Take 2,000 Units by mouth daily.    Yes [provider]  Cyanocobalamin (B-12) 500 MCG TABS Take 500 mcg by mouth every evening.   Yes [provider]  furosemide (LASIX) 40 MG tablet Take 40 mg by mouth daily.   Yes [provider]  insulin aspart (NOVOLOG) 100 UNIT/ML injection Inject 0-12 Units into the skin 3 (three) times daily with meals. Sliding scale   Yes [provider]  levofloxacin (LEVAQUIN) 750 MG tablet Take 750 mg by mouth daily.   Yes [provider]  LORazepam (ATIVAN) 0.5 MG tablet Take 0.5 mg  by mouth every evening.    Yes [provider]  magnesium oxide (MAG-OX) 400 MG tablet Take 200 mg by mouth daily.    Yes [provider]  nitroGLYCERIN (NITROSTAT) 0.4 MG SL tablet Place 0.4 mg under the tongue every 5 (five) minutes as needed for chest pain.    Yes [provider]  nystatin (MYCOSTATIN/NYSTOP) powder Apply topically 3 (three) times daily. To red and yeasty groin area   Yes [provider]  omeprazole (PRILOSEC) 40 MG capsule Take 40 mg by mouth at bedtime.   Yes [provider]  oxybutynin (DITROPAN) 5 MG tablet Take 5 mg by mouth 2 (two) times daily. 09/20/18  Yes [provider]  pravastatin (PRAVACHOL) 40 MG tablet Take 1 tablet (40 mg total) by mouth at bedtime. 03/08/18  Yes Belva Crome, MD  predniSONE (DELTASONE) 10 MG tablet Take 10 mg by mouth daily with breakfast.   Yes [provider]  primidone (MYSOLINE) 50 MG tablet Take 50 mg by mouth at bedtime.   Yes [provider]  tamsulosin (FLOMAX) 0.4 MG CAPS capsule Take 0.4 mg by mouth daily after supper.  10/01/14  Yes [provider]  traMADol (ULTRAM) 50 MG tablet Take 50 mg by mouth every 6 (six) hours as needed for moderate pain.    Yes [provider]  vitamin A 10000 UNIT capsule Take 10,000 Units by mouth daily.    Yes [provider]  vitamin E 400 UNIT capsule Take 400 Units by mouth daily.    Yes [provider]  zinc oxide 20 % ointment Apply 1 application topically 3 (three) times daily. To bilateral sheared areas on buttocks cheeks   Yes [provider]  carvedilol (COREG) 6.25 MG tablet TAKE 1 TABLET TWICE A DAY WITH A MEAL. Patient not taking: Reported on 03/01/2019 02/22/18   Burtis Junes, NP  feeding supplement, ENSURE ENLIVE, (ENSURE ENLIVE) LIQD Take 237 mLs by mouth 2 (two) times daily between meals. Patient not taking: Reported on 01/26/2019 04/07/18   Debbe Odea, MD    guaiFENesin-dextromethorphan (ROBITUSSIN DM) 100-10 MG/5ML syrup Take 5 mLs by mouth every 4 (four) hours as needed for cough. Patient not taking: Reported on 11/08/2018 10/13/18   Tawni Millers, MD    Physical Exam: Vitals:   03/01/19 0300 03/01/19 0315 03/01/19 0330 03/01/19 0345  BP: 129/76 140/66 107/65 111/60  Pulse: 93 100 (!) 104 (!) 101  Resp: (!) 22 (!) _0 Temp:      TempSrc:      SpO2: 99% 98% 96% 90%  Weight:      Height:        Physical Exam  Constitutional: He appears well-developed and well-nourished. No distress.  HENT:  Head: Normocephalic.  Eyes: Right eye exhibits no discharge. Left eye exhibits  no discharge.  Neck: Neck supple.  Cardiovascular: Normal rate, regular rhythm and intact distal pulses.  Pulmonary/Chest: Effort normal. No respiratory distress. He has no wheezes.  Equal air entry bilaterally upon auscultation of anterior lung fields  Abdominal: Soft. Bowel sounds are normal. He exhibits no distension. There is no abdominal tenderness. There is no rebound and no guarding.  Musculoskeletal:        General: No edema.     Comments: Left lower extremity externally rotated Dorsalis pedis pulse intact bilaterally  Neurological:  Awake and alert Oriented to self only  Skin: Skin is warm and dry. He is not diaphoretic.     Labs on Admission: I have personally reviewed following labs and imaging studies  CBC: Recent Labs  Lab 03/01/19 0117  WBC 13.1*  NEUTROABS 11.6*  HGB 14.2  HCT 43.0  MCV 85.3  PLT 889*   Basic Metabolic Panel: Recent Labs  Lab 03/01/19 0117  NA 126*  K 4.1  CL 95*  CO2 21*  GLUCOSE 104*  BUN 15  CREATININE 1.08  CALCIUM 8.5*   GFR: Estimated Creatinine Clearance: 53.5 mL/min (by C-G formula based on SCr of 1.08 mg/dL). Liver Function Tests: Recent Labs  Lab 03/01/19 0117  AST 34  ALT 16  ALKPHOS 64  BILITOT 1.7*  PROT 5.9*  ALBUMIN 2.7*   No results for input(s): LIPASE, AMYLASE in  the last 168 hours. No results for input(s): AMMONIA in the last 168 hours. Coagulation Profile: No results for input(s): INR, PROTIME in the last 168 hours. Cardiac Enzymes: No results for input(s): CKTOTAL, CKMB, CKMBINDEX, TROPONINI in the last 168 hours. BNP (last 3 results) No results for input(s): PROBNP in the last 8760 hours. HbA1C: No results for input(s): HGBA1C in the last 72 hours. CBG: No results for input(s): GLUCAP in the last 168 hours. Lipid Profile: No results for input(s): CHOL, HDL, LDLCALC, TRIG, CHOLHDL, LDLDIRECT in the last 72 hours. Thyroid Function Tests: No results for input(s): TSH, T4TOTAL, FREET4, T3FREE, THYROIDAB in the last 72 hours. Anemia Panel: No results for input(s): VITAMINB12, FOLATE, FERRITIN, TIBC, IRON, RETICCTPCT in the last 72 hours. Urine analysis:    Component Value Date/Time   COLORURINE YELLOW 01/25/2019 1845   APPEARANCEUR CLEAR 01/25/2019 1845   LABSPEC 1.010 01/25/2019 1845   PHURINE 7.0 01/25/2019 1845   GLUCOSEU NEGATIVE 01/25/2019 1845   HGBUR NEGATIVE 01/25/2019 1845   BILIRUBINUR NEGATIVE 01/25/2019 1845   KETONESUR NEGATIVE 01/25/2019 1845   PROTEINUR NEGATIVE 01/25/2019 1845   UROBILINOGEN 0.2 11/05/2014 0303   NITRITE NEGATIVE 01/25/2019 1845   LEUKOCYTESUR NEGATIVE 01/25/2019 1845    Radiological Exams on Admission: Ct Head Wo Contrast  Result Date: 03/01/2019 CLINICAL DATA:  Headache, post traumatic; C-spine fx, traumatic. Possible fall. EXAM: CT HEAD WITHOUT CONTRAST CT CERVICAL SPINE WITHOUT CONTRAST TECHNIQUE: Multidetector CT imaging of the head and cervical spine was performed following the standard protocol without intravenous contrast. Multiplanar CT image reconstructions of the cervical spine were also generated. COMPARISON:  Head CT 11/05/2014 cervical spine MRI 05/29/2011 FINDINGS: CT HEAD FINDINGS Brain: Generalized atrophy. Ventriculomegaly is likely secondary to central atrophy and slightly progressed from  2015 CT. Progressive chronic small vessel ischemia. No intracranial hemorrhage. No evidence of acute infarct. No midline shift or mass effect. No subdural or extra-axial fluid collection. Vascular: Atherosclerosis of skullbase vasculature without hyperdense vessel or abnormal calcification. Skull: No fracture or focal lesion. Sinuses/Orbits: Paranasal sinuses and mastoid air cells are clear. The visualized orbits are  unremarkable. Other: None. CT CERVICAL SPINE FINDINGS Alignment: Straightening of normal lordosis, mild leftward scoliotic curvature. Minimal anterolisthesis of C3 on C4 and C6 on C7 is likely degenerative and facet mediated. No traumatic subluxation. Skull base and vertebrae: No acute fracture. Dens is intact. Bony fragmentation and erosive change at the atlantodental interval is chronic and likely related to inflammatory arthropathy. Underlying heterogeneous bony mineralization suggest osteopenia. Soft tissues and spinal canal: No prevertebral fluid or swelling. No visible canal hematoma. Heterogeneous thyroid gland with enlargement of the left lobe, no well-defined nodule, similar appearance to 07/06/2014 chest CT. Disc levels: Near complete disc space loss at C5-C6, unchanged from prior. Disc space narrowing and endplate spurring at additional levels, most prominent C6-C7. Multilevel facet hypertrophy. Upper chest: No acute findings. Other: None. IMPRESSION: 1. No acute intracranial abnormality. No skull fracture. 2. Generalized atrophy and chronic small vessel ischemia, mildly progressed from 2015. 3. Multilevel degenerative a chronic change in the cervical spine without acute fracture or subluxation. Electronically Signed   By: Keith Rake M.D.   On: 03/01/2019 02:45   Ct Cervical Spine Wo Contrast  Result Date: 03/01/2019 CLINICAL DATA:  Headache, post traumatic; C-spine fx, traumatic. Possible fall. EXAM: CT HEAD WITHOUT CONTRAST CT CERVICAL SPINE WITHOUT CONTRAST TECHNIQUE:  Multidetector CT imaging of the head and cervical spine was performed following the standard protocol without intravenous contrast. Multiplanar CT image reconstructions of the cervical spine were also generated. COMPARISON:  Head CT 11/05/2014 cervical spine MRI 05/29/2011 FINDINGS: CT HEAD FINDINGS Brain: Generalized atrophy. Ventriculomegaly is likely secondary to central atrophy and slightly progressed from 2015 CT. Progressive chronic small vessel ischemia. No intracranial hemorrhage. No evidence of acute infarct. No midline shift or mass effect. No subdural or extra-axial fluid collection. Vascular: Atherosclerosis of skullbase vasculature without hyperdense vessel or abnormal calcification. Skull: No fracture or focal lesion. Sinuses/Orbits: Paranasal sinuses and mastoid air cells are clear. The visualized orbits are unremarkable. Other: None. CT CERVICAL SPINE FINDINGS Alignment: Straightening of normal lordosis, mild leftward scoliotic curvature. Minimal anterolisthesis of C3 on C4 and C6 on C7 is likely degenerative and facet mediated. No traumatic subluxation. Skull base and vertebrae: No acute fracture. Dens is intact. Bony fragmentation and erosive change at the atlantodental interval is chronic and likely related to inflammatory arthropathy. Underlying heterogeneous bony mineralization suggest osteopenia. Soft tissues and spinal canal: No prevertebral fluid or swelling. No visible canal hematoma. Heterogeneous thyroid gland with enlargement of the left lobe, no well-defined nodule, similar appearance to 07/06/2014 chest CT. Disc levels: Near complete disc space loss at C5-C6, unchanged from prior. Disc space narrowing and endplate spurring at additional levels, most prominent C6-C7. Multilevel facet hypertrophy. Upper chest: No acute findings. Other: None. IMPRESSION: 1. No acute intracranial abnormality. No skull fracture. 2. Generalized atrophy and chronic small vessel ischemia, mildly progressed from  2015. 3. Multilevel degenerative a chronic change in the cervical spine without acute fracture or subluxation. Electronically Signed   By: Keith Rake M.D.   On: 03/01/2019 02:45   Dg Chest Portable 1 View  Result Date: 03/01/2019 CLINICAL DATA:  Initial evaluation for acute hypoxia, low grade fever. EXAM: PORTABLE CHEST 1 VIEW COMPARISON:  Prior radiograph from 01/25/2019. FINDINGS: Transverse heart size at the upper limits of normal. Mediastinal silhouette within normal limits. Aortic atherosclerosis. Lungs hypoinflated. Mild linear left basilar atelectasis and/or scarring. No focal infiltrates. No edema or definite pleural effusion. No pneumothorax. No acute osseous finding. Osteopenia noted. IMPRESSION: 1. Shallow lung inflation with mild left  basilar atelectasis and/or scarring. No other active cardiopulmonary disease identified. 2. Aortic atherosclerosis. Electronically Signed   By: Jeannine Boga M.D.   On: 03/01/2019 02:24   Dg Hip Unilat With Pelvis 2-3 Views Left  Result Date: 03/01/2019 CLINICAL DATA:  Initial evaluation for acute trauma, fall. EXAM: DG HIP (WITH OR WITHOUT PELVIS) 2-3V LEFT COMPARISON:  None. FINDINGS: Acute subcapital fracture of the left femoral neck with superior subluxation. Femoral head remains normally position within the acetabulum. Femoral head height maintained. Bony pelvis intact. Limited views of the right hip demonstrate no acute finding. Underlying osteoarthritic changes noted about the hips bilaterally, left greater than right. No soft tissue abnormality. IMPRESSION: Acute subcapital fracture of the left femoral neck with superior subluxation. Electronically Signed   By: Jeannine Boga M.D.   On: 03/01/2019 02:25    EKG: Independently reviewed.  Sinus arrhythmia, PVCs, LAFB.  No significant change since prior tracing.  Assessment/Plan Principal Problem:   Sepsis ([place]) Active Problems:   Suspected Covid-19 Virus Infection   Acute respiratory  failure with hypoxia ([facility])   Hip fracture ([facility])   Fall   Sepsis, unclear source; suspicion for COVID-19 -Temperature 100.1 F.  Tachycardic and tachypneic.  Not hypotensive. -White count 34.7 with neutrophilic predominance. -Chest x-ray showing mild left basilar atelectasis and/or scarring.  No other active cardiopulmonary disease. -Start broad-spectrum antibiotics (vancomycin and Zosyn) -Testing for SARS-CoV-2 -Check procalcitonin, ferritin, ESR, CRP, LDH -Respiratory viral panel -Droplet and contact precautions -Blood culture x2 -UA, urine culture  Acute hypoxic respiratory failure Found to be hypoxic by EMS with sats in the upper 80s.  Initially placed on 2 L supplemental oxygen in the ED, currently on room air.  Chest x-ray not suggestive of pneumonia.  Suspicion for possible COVID-19.  Also suspicion for possible PE given tachycardia, tachypnea, and hypoxia. -CT angiogram ordered in the ED currently pending -Supplemental oxygen as needed  Hip fracture secondary to fall -X-ray showing acute subcapital fracture of the left femoral neck with superior subluxation. -ED provider discussed with Dr. Ninfa Linden from orthopedics.  Patient will be seen in the morning. -Morphine 1 mg every 3 hours as needed for pain -Keep n.p.o.  Recent fall -Per EMS report, patient had a fall at his nursing home 2 days ago.  Unclear if this was a syncopal episode as patient is not able to provide any history.  CT head and C-spine negative for acute finding.  X-ray of left hip with evidence of fracture as mentioned above. -Cardiac monitoring -Fall precautions -Patient will need PT/OT eval after surgery  Hyponatremia Sodium 126.  Currently not on a thiazide diuretic.  No complaints of vomiting or diarrhea. -Gentle IV fluid hydration -Continue to monitor BMP every 4 hours to avoid rapid correction.  Goal sodium correction of 4 to 6 mEq in 24 hours. -Check serum osmolarity -Check urine sodium and  osmolarity  Elevated troponin Troponin 0.09.  EKG not suggestive of ACS.  Patient is not complaining of any chest pain and appears comfortable on exam.  Mild troponin elevation likely related to demand ischemia from sepsis. -Cardiac monitoring -Continue to trend troponin  Elevated T bili T bili 1.7, remainder of LFTs normal.  Possibly related to sepsis.  Patient denies any abdominal pain, nausea, or vomiting.  Abdominal exam benign. -Repeat hepatic function panel; check direct and indirect bilirubin levels  ?Acute encephalopathy Patient is oriented to self only.  Unclear whether he has baseline dementia.  Head CT negative for acute finding. -Continue to monitor  Mild thrombocytopenia Platelet count 144,000.  No signs of active bleeding. -Repeat CBC in a.m. to confirm.  Chronic systolic congestive heart failure -Currently not volume overloaded on exam.  Hold home Lasix.  Insulin-dependent diabetes mellitus -Sliding scale insulin and CBG checks.  DVT prophylaxis: Subcutaneous heparin Code Status: DNR.  Signed form present at bedside. Family Communication: No family available. Disposition Plan: Anticipate discharge after clinical improvement. Consults called: Orthopedics Admission status: It is my clinical opinion that admission to INPATIENT is reasonable and necessary in this 85 y.o. male  presenting with sepsis from unclear source, suspicion for COVID-19, acute hypoxic respiratory failure, and left hip fracture.  with pertinent positives on physical exam including: Left lower extremity externally rotated  and pertinent positives on radiographic and laboratory data including: X-ray with evidence of hip fracture  Workup and treatment include IV broad-spectrum antibiotics, IV fluid hydration, IV pain medication, testing for COVID-19, orthopedic evaluation in the morning.  Given the aforementioned, the predictability of an adverse outcome is felt to be significant. I expect that  the patient will require at least 2 midnights in the hospital to treat this condition.  This chart was dictated using voice recognition software.  Despite best efforts to proofread, errors can occur which can change the documentation meaning.  Shela Leff MD [facility] Pager [phone]  If 7PM-7AM, please contact night-coverage [email or web address] Password [facility]  03/01/2019, 4:17 AM

## 2019-03-01 NOTE — Consult Note (Signed)
Reason for Consult: Left hip displaced femoral neck fracture Referring Physician: Zacarias Pontes EDP (Dr. Leonides Schanz)  Anthony [name] is an 85 y.o. male.  HPI: The patient is a 85 year old gentleman with dementia who lives at a nursing care facility.  He does have close family support.  About 2 days ago he sustained a mechanical fall and x-rays were obtained.  The report came back with a left hip femoral neck fracture and he was sent to the [facility] emergency room and this was confirmed.  The patient does have a history of pneumonia.  Per the EMT report he had low O2 sats and a temperature of 100.1.  In the emergency room he was placed on droplet and contact precautions suspicious for COVID-19 and a test for this has been performed.  Orthopedic surgery is consulted for evaluation and treatment options as it relates to his displaced left hip femoral neck fracture.  Past Medical History:  Diagnosis Date  . Arthritis    rheumatoid-  Dr Barkley Boards  . CHF (congestive heart failure) (HCC)    chronic systolic heart failure per Dr [name] LOV note 09/23/11.   EKG 4/12, stress test  and cath from 8/12 on chart.  Clearance with LOV Dr [name] on chart  . Coronary artery disease   . Crohn disease ([place])   . Dysrhythmia    nonsustained,asymptomatic VT per Dr [name] office note  resulting in cath  . GERD (gastroesophageal reflux disease)   . Heart attack ([place]) 1996, 06/2011  . Hyperlipidemia   . Hypertension   . Pneumonia 1993  . Prostate troubles    states take alternating meds for bladder/prostate control- "age thing"  . Sleep apnea    sleep study years ago- has apnea but did not qualify for CPAP    Past Surgical History:  Procedure Laterality Date  . CARDIAC CATHETERIZATION     1996/ 2012 report on chart  . COLONOSCOPY    . INGUINAL HERNIA REPAIR  11/04/2011   Procedure: HERNIA REPAIR INGUINAL ADULT;  Surgeon: Pedro Earls, MD;  Location: [facility];  Service: General;  Laterality: Right;  Right Inguinal  Hernia Repair with Mesh  . KNEE ARTHROSCOPY Right 1976  . MENISCUS DEBRIDEMENT Left 1996  . TRIGGER FINGER RELEASE Right 10/10/2009  . UMBILICAL HERNIA REPAIR  2009  . WRIST SURGERY Left 04/25/91    Family History  Problem Relation Age of Onset  . Heart disease Mother   . Rheum arthritis Mother   . Emphysema Father     Social History:  reports that he quit smoking about 48 years ago. His smoking use included cigarettes. He has a 60.00 pack-year smoking history. He has never used smokeless tobacco. He reports that he does not drink alcohol or use drugs.  Allergies: No Known Allergies  Medications: I have reviewed the patient's current medications.  Results for orders placed or performed during the hospital encounter of 03/01/19 (from the past 48 hour(s))  CBC with Differential     Status: Abnormal   Collection Time: 03/01/19  1:17 AM  Result Value Ref Range   WBC 13.1 (H) 4.0 - 10.5 K/uL   RBC 5.04 4.22 - 5.81 MIL/uL   Hemoglobin 14.2 13.0 - 17.0 g/dL   HCT 43.0 39.0 - 52.0 %   MCV 85.3 80.0 - 100.0 fL   MCH 28.2 26.0 - 34.0 pg   MCHC 33.0 30.0 - 36.0 g/dL   RDW 14.6 11.5 - 15.5 %   Platelets 144 (  L) 150 - 400 K/uL   nRBC 0.0 0.0 - 0.2 %   Neutrophils Relative % 88 %   Neutro Abs 11.6 (H) 1.7 - 7.7 K/uL   Lymphocytes Relative 6 %   Lymphs Abs 0.8 0.7 - 4.0 K/uL   Monocytes Relative 4 %   Monocytes Absolute 0.5 0.1 - 1.0 K/uL   Eosinophils Relative 1 %   Eosinophils Absolute 0.2 0.0 - 0.5 K/uL   Basophils Relative 0 %   Basophils Absolute 0.0 0.0 - 0.1 K/uL   Immature Granulocytes 1 %   Abs Immature Granulocytes 0.09 (H) 0.00 - 0.07 K/uL    Comment: Performed at [facility] [address], [place], [place] [address]  Comprehensive metabolic panel     Status: Abnormal   Collection Time: 03/01/19  1:17 AM  Result Value Ref Range   Sodium 126 (L) 135 - 145 mmol/L   Potassium 4.1 3.5 - 5.1 mmol/L   Chloride 95 (L) 98 - 111 mmol/L   CO2 21 (L) 22 - 32 mmol/L    Glucose, Bld 104 (H) 70 - 99 mg/dL   BUN 15 8 - 23 mg/dL   Creatinine, Ser 1.08 0.61 - 1.24 mg/dL   Calcium 8.5 (L) 8.9 - 10.3 mg/dL   Total Protein 5.9 (L) 6.5 - 8.1 g/dL   Albumin 2.7 (L) 3.5 - 5.0 g/dL   AST 34 15 - 41 U/L   ALT 16 0 - 44 U/L   Alkaline Phosphatase 64 38 - 126 U/L   Total Bilirubin 1.7 (H) 0.3 - 1.2 mg/dL   GFR calc non Af Amer >60 >60 mL/min   GFR calc Af Amer >60 >60 mL/min   Anion gap 10 5 - 15    Comment: Performed at [facility], [place] [address], [place], [place] [address]  Brain natriuretic peptide     Status: Abnormal   Collection Time: 03/01/19  1:20 AM  Result Value Ref Range   B Natriuretic Peptide 1,631.3 (H) 0.0 - 100.0 pg/mL    Comment: Performed at [facility] [address] [place], [place] [address]  I-stat troponin, ED     Status: Abnormal   Collection Time: 03/01/19  1:27 AM  Result Value Ref Range   Troponin i, poc 0.09 (HH) 0.00 - 0.08 ng/mL   Comment NOTIFIED PHYSICIAN    Comment 3            Comment: Due to the release kinetics of cTnI, a negative result within the first hours of the onset of symptoms does not rule out myocardial infarction with certainty. If myocardial infarction is still suspected, repeat the test at appropriate intervals.   Type and screen [place]     Status: None   Collection Time: 03/01/19  3:38 AM  Result Value Ref Range   ABO/RH(D) B POS    Antibody Screen NEG    Sample Expiration      03/04/2019 Performed at [facility], [place] [address], [place], [place] [address]   ABO/Rh     Status: None   Collection Time: 03/01/19  3:38 AM  Result Value Ref Range   ABO/RH(D)      B POS Performed at [facility] [address] [place], [place] [address]   CBC     Status: Abnormal   Collection Time: 03/01/19  5:09 AM  Result Value Ref Range   WBC 12.3 (H) 4.0 -  10.5 K/uL   RBC 5.06 4.22 - 5.81 MIL/uL   Hemoglobin 14.3 13.0 - 17.0 g/dL   HCT 43.6 39.0 - 52.0 %    MCV 86.2 80.0 - 100.0 fL   MCH 28.3 26.0 - 34.0 pg   MCHC 32.8 30.0 - 36.0 g/dL   RDW 14.7 11.5 - 15.5 %   Platelets 128 (L) 150 - 400 K/uL   nRBC 0.0 0.0 - 0.2 %    Comment: Performed at [facility] [address], [place], [place] [address]  Osmolality     Status: Abnormal   Collection Time: 03/01/19  5:09 AM  Result Value Ref Range   Osmolality 273 (L) 275 - 295 mOsm/kg    Comment: SPECIMEN HEMOLYZED. HEMOLYSIS MAY AFFECT INTEGRITY OF RESULTS. REPEATED TO VERIFY Performed at [facility], [place] [address] [place], [place] [address]   Hepatic function panel     Status: Abnormal   Collection Time: 03/01/19  5:09 AM  Result Value Ref Range   Total Protein 6.0 (L) 6.5 - 8.1 g/dL   Albumin 2.6 (L) 3.5 - 5.0 g/dL   AST 36 15 - 41 U/L   ALT 18 0 - 44 U/L   Alkaline Phosphatase 56 38 - 126 U/L   Total Bilirubin 1.9 (H) 0.3 - 1.2 mg/dL   Bilirubin, Direct 0.5 (H) 0.0 - 0.2 mg/dL   Indirect Bilirubin 1.4 (H) 0.3 - 0.9 mg/dL    Comment: Performed at [facility] [address], [place], [place] [address]  Troponin I - Now Then Q6H     Status: Abnormal   Collection Time: 03/01/19  5:09 AM  Result Value Ref Range   Troponin I 0.06 (HH) <0.03 ng/mL    Comment: CRITICAL RESULT CALLED TO, READ BACK BY AND VERIFIED WITH: Augustina Mood [id number] [facility] Performed at [facility], [place] [address] [place],  [address]   Procalcitonin     Status: None   Collection Time: 03/01/19  5:09 AM  Result Value Ref Range   Procalcitonin 0.89 ng/mL    Comment:        Interpretation: PCT > 0.5 ng/mL and <= 2 ng/mL: Systemic infection (sepsis) is possible, but other conditions are known to elevate PCT as well. SPECIMEN HEMOLYZED. HEMOLYSIS MAY AFFECT INTEGRITY OF RESULTS. (NOTE)       Sepsis PCT Algorithm           Lower Respiratory Tract                                      Infection PCT Algorithm    ----------------------------     ----------------------------          PCT < 0.25 ng/mL                PCT < 0.10 ng/mL         Strongly encourage             Strongly discourage   discontinuation of antibiotics    initiation of antibiotics    ----------------------------     -----------------------------       PCT 0.25 - 0.50 ng/mL            PCT 0.10 - 0.25 ng/mL               OR       >80% decrease  in PCT            Discourage initiation of                                            antibiotics      Encourage discontinuation           of antibiotics    ----------------------------     -----------------------------          PCT >= 0.50 ng/mL              PCT 0.26 - 0.50 ng/mL               AND       <80% decrease in PCT             Encourage initiation of                                             antibiotics       Encourage continuation           of antibiotics    ----------------------------     -----------------------------        PCT >= 0.50 ng/mL                  PCT > 0.50 ng/mL               AND         increase in PCT                  Strongly encourage                                      initiation of antibiotics    Strongly encourage escalation           of antibiotics                                     -----------------------------                                           PCT <= 0.25 ng/mL                                                 OR                                        > 80% decrease in PCT                                     Discontinue / Do not initiate  antibiotics Performed at [facility], [place] [address] , [place], [place] [address]   Ferritin     Status: Abnormal   Collection Time: 03/01/19  5:09 AM  Result Value Ref Range   Ferritin 399 (H) 24 - 336 ng/mL    Comment: Performed at [facility], [place] [address] [place], [place] [address]  Sedimentation rate     Status: Abnormal   Collection Time: 03/01/19  5:09 AM  Result Value Ref Range   Sed Rate 27  (H) 0 - 16 mm/hr    Comment: Performed at [facility] [address], [place], [place] [address]  C-reactive protein     Status: Abnormal   Collection Time: 03/01/19  5:09 AM  Result Value Ref Range   CRP 22.1 (H) <1.0 mg/dL    Comment: Performed at [facility] [address], [place], [place] [address]  Lactate dehydrogenase     Status: Abnormal   Collection Time: 03/01/19  5:09 AM  Result Value Ref Range   LDH 313 (H) 98 - 192 U/L    Comment: Performed at [facility], [place] [address] [place], [place] [address]  Basic metabolic panel     Status: Abnormal   Collection Time: 03/01/19  5:09 AM  Result Value Ref Range   Sodium 128 (L) 135 - 145 mmol/L   Potassium 4.1 3.5 - 5.1 mmol/L   Chloride 91 (L) 98 - 111 mmol/L   CO2 25 22 - 32 mmol/L   Glucose, Bld 103 (H) 70 - 99 mg/dL   BUN 16 8 - 23 mg/dL   Creatinine, Ser 1.06 0.61 - 1.24 mg/dL   Calcium 8.5 (L) 8.9 - 10.3 mg/dL   GFR calc non Af Amer >60 >60 mL/min   GFR calc Af Amer >60 >60 mL/min   Anion gap 12 5 - 15    Comment: Performed at [facility] [address], [place], [place] [address]  Protime-INR     Status: None   Collection Time: 03/01/19  6:19 AM  Result Value Ref Range   Prothrombin Time 14.5 11.4 - 15.2 seconds   INR 1.1 0.8 - 1.2    Comment: (NOTE) INR goal varies based on device and disease states. Performed at [facility], [place] [address], [place],  [address]   CBG monitoring, ED     Status: None   Collection Time: 03/01/19  8:08 AM  Result Value Ref Range   Glucose-Capillary 94 70 - 99 mg/dL    Ct Head Wo Contrast  Result Date: 03/01/2019 CLINICAL DATA:  Headache, post traumatic; C-spine fx, traumatic. Possible fall. EXAM: CT HEAD WITHOUT CONTRAST CT CERVICAL SPINE WITHOUT CONTRAST TECHNIQUE: Multidetector CT imaging of the head and cervical spine was performed following the standard protocol without intravenous contrast. Multiplanar CT image reconstructions of  the cervical spine were also generated. COMPARISON:  Head CT 11/05/2014 cervical spine MRI 05/29/2011 FINDINGS: CT HEAD FINDINGS Brain: Generalized atrophy. Ventriculomegaly is likely secondary to central atrophy and slightly progressed from 2015 CT. Progressive chronic small vessel ischemia. No intracranial hemorrhage. No evidence of acute infarct. No midline shift or mass effect. No subdural or extra-axial fluid collection. Vascular: Atherosclerosis of skullbase vasculature without hyperdense vessel or abnormal calcification. Skull: No fracture or focal lesion. Sinuses/Orbits: Paranasal sinuses and mastoid air cells are clear. The visualized orbits are unremarkable. Other: None. CT CERVICAL SPINE FINDINGS Alignment: Straightening of normal lordosis, mild  leftward scoliotic curvature. Minimal anterolisthesis of C3 on C4 and C6 on C7 is likely degenerative and facet mediated. No traumatic subluxation. Skull base and vertebrae: No acute fracture. Dens is intact. Bony fragmentation and erosive change at the atlantodental interval is chronic and likely related to inflammatory arthropathy. Underlying heterogeneous bony mineralization suggest osteopenia. Soft tissues and spinal canal: No prevertebral fluid or swelling. No visible canal hematoma. Heterogeneous thyroid gland with enlargement of the left lobe, no well-defined nodule, similar appearance to 07/06/2014 chest CT. Disc levels: Near complete disc space loss at C5-C6, unchanged from prior. Disc space narrowing and endplate spurring at additional levels, most prominent C6-C7. Multilevel facet hypertrophy. Upper chest: No acute findings. Other: None. IMPRESSION: 1. No acute intracranial abnormality. No skull fracture. 2. Generalized atrophy and chronic small vessel ischemia, mildly progressed from 2015. 3. Multilevel degenerative a chronic change in the cervical spine without acute fracture or subluxation. Electronically Signed   By: Keith Rake M.D.   On:  03/01/2019 02:45   Ct Angio Chest Pe W And/or Wo Contrast  Result Date: 03/01/2019 CLINICAL DATA:  Femur fracture and fever. EXAM: CT ANGIOGRAPHY CHEST WITH CONTRAST TECHNIQUE: Multidetector CT imaging of the chest was performed using the standard protocol during bolus administration of intravenous contrast. Multiplanar CT image reconstructions and MIPs were obtained to evaluate the vascular anatomy. CONTRAST:  81m ISOVUE-370 IOPAMIDOL (ISOVUE-370) INJECTION 76% COMPARISON:  10/07/2018 FINDINGS: Cardiovascular: Satisfactory opacification of the pulmonary arteries to the segmental level. No evidence of pulmonary embolism. Normal heart size. No pericardial effusion. Atherosclerotic calcification of the aorta and coronaries. Mediastinum/Nodes: Negative for adenopathy. Nodular thyroid gland, especially on the left, stable. Lungs/Pleura: Mild increased density of the lungs likely related to expiratory phase imaging with atelectasis. Small discrete ground-glass opacity in the subpleural left upper lobe. Central airways are clear. No edema or effusion. Upper Abdomen: No acute finding Musculoskeletal: Remote T5 superior endplate fracture Review of the MIP images confirms the above findings. IMPRESSION: 1. Negative for pulmonary embolism. 2. Increased pulmonary density is likely from atelectasis on this expiratory phase scan. Small ground-glass nodule in the left, cannot exclude early and infectious or inflammatory process in the setting of fever. Electronically Signed   By: JMonte FantasiaM.D.   On: 03/01/2019 04:58   Ct Cervical Spine Wo Contrast  Result Date: 03/01/2019 CLINICAL DATA:  Headache, post traumatic; C-spine fx, traumatic. Possible fall. EXAM: CT HEAD WITHOUT CONTRAST CT CERVICAL SPINE WITHOUT CONTRAST TECHNIQUE: Multidetector CT imaging of the head and cervical spine was performed following the standard protocol without intravenous contrast. Multiplanar CT image reconstructions of the cervical spine  were also generated. COMPARISON:  Head CT 11/05/2014 cervical spine MRI 05/29/2011 FINDINGS: CT HEAD FINDINGS Brain: Generalized atrophy. Ventriculomegaly is likely secondary to central atrophy and slightly progressed from 2015 CT. Progressive chronic small vessel ischemia. No intracranial hemorrhage. No evidence of acute infarct. No midline shift or mass effect. No subdural or extra-axial fluid collection. Vascular: Atherosclerosis of skullbase vasculature without hyperdense vessel or abnormal calcification. Skull: No fracture or focal lesion. Sinuses/Orbits: Paranasal sinuses and mastoid air cells are clear. The visualized orbits are unremarkable. Other: None. CT CERVICAL SPINE FINDINGS Alignment: Straightening of normal lordosis, mild leftward scoliotic curvature. Minimal anterolisthesis of C3 on C4 and C6 on C7 is likely degenerative and facet mediated. No traumatic subluxation. Skull base and vertebrae: No acute fracture. Dens is intact. Bony fragmentation and erosive change at the atlantodental interval is chronic and likely related to inflammatory arthropathy. Underlying heterogeneous  bony mineralization suggest osteopenia. Soft tissues and spinal canal: No prevertebral fluid or swelling. No visible canal hematoma. Heterogeneous thyroid gland with enlargement of the left lobe, no well-defined nodule, similar appearance to 07/06/2014 chest CT. Disc levels: Near complete disc space loss at C5-C6, unchanged from prior. Disc space narrowing and endplate spurring at additional levels, most prominent C6-C7. Multilevel facet hypertrophy. Upper chest: No acute findings. Other: None. IMPRESSION: 1. No acute intracranial abnormality. No skull fracture. 2. Generalized atrophy and chronic small vessel ischemia, mildly progressed from 2015. 3. Multilevel degenerative a chronic change in the cervical spine without acute fracture or subluxation. Electronically Signed   By: Keith Rake M.D.   On: 03/01/2019 02:45   Dg  Chest Portable 1 View  Result Date: 03/01/2019 CLINICAL DATA:  Initial evaluation for acute hypoxia, low grade fever. EXAM: PORTABLE CHEST 1 VIEW COMPARISON:  Prior radiograph from 01/25/2019. FINDINGS: Transverse heart size at the upper limits of normal. Mediastinal silhouette within normal limits. Aortic atherosclerosis. Lungs hypoinflated. Mild linear left basilar atelectasis and/or scarring. No focal infiltrates. No edema or definite pleural effusion. No pneumothorax. No acute osseous finding. Osteopenia noted. IMPRESSION: 1. Shallow lung inflation with mild left basilar atelectasis and/or scarring. No other active cardiopulmonary disease identified. 2. Aortic atherosclerosis. Electronically Signed   By: Jeannine Boga M.D.   On: 03/01/2019 02:24   Dg Hip Unilat With Pelvis 2-3 Views Left  Result Date: 03/01/2019 CLINICAL DATA:  Initial evaluation for acute trauma, fall. EXAM: DG HIP (WITH OR WITHOUT PELVIS) 2-3V LEFT COMPARISON:  None. FINDINGS: Acute subcapital fracture of the left femoral neck with superior subluxation. Femoral head remains normally position within the acetabulum. Femoral head height maintained. Bony pelvis intact. Limited views of the right hip demonstrate no acute finding. Underlying osteoarthritic changes noted about the hips bilaterally, left greater than right. No soft tissue abnormality. IMPRESSION: Acute subcapital fracture of the left femoral neck with superior subluxation. Electronically Signed   By: Jeannine Boga M.D.   On: 03/01/2019 02:25    ROS Blood pressure 130/66, pulse 97, temperature 98.7 F (37.1 C), temperature source Oral, resp. rate 18, height 5' 10"  (1.778 m), weight 79.4 kg, SpO2 96 %. Physical Exam  Constitutional: He appears well-developed and well-nourished.  HENT:  Head: Normocephalic and atraumatic.  Eyes: Pupils are equal, round, and reactive to light.  Neck: Normal range of motion.  Cardiovascular: Normal rate.  Respiratory: Effort  normal.  GI: Soft.  Musculoskeletal:     Left hip: He exhibits decreased range of motion, decreased strength, tenderness, bony tenderness and deformity.  Neurological: He is alert.   The patient is pleasantly demented.  He is alert but answers minimal questions.  He does exhibit signs of pain with attempts of rotation of his left hip.  His right hip exam is normal.  His left lower extremity is shortened and externally rotated.   Assessment/Plan: Left hip acute displaced femoral neck fracture  I have already spoken to his wife and I will speak to his daughter as well.  Certainly this can potentially be treated nonoperative if that is the family's wishes given the fact that the patient is mainly bedbound.  However they do stand him up at the nursing facility to take him to the bathroom.  He is uncomfortable with manipulation of his hip which may make it difficult for sitting up or even pivoting to a wheelchair.  Obviously the risk of nonoperative treatment include DVT, bedsores and pneumonia.  The surgical option  would be a left hip hemiarthroplasty it would be done for pain control and mobility purposes.  I would not take him to the operating room today in order to give the family a chance to discuss what their wishes for him would be.  Also, in light of a pending COVID-19 test, we would hold off on surgery for at least the first 24 hours with the hope that the rapid test would come back in order to plan for a specific operating room that known COVID-19 patient's are being scheduled in the specific room.  Right now he is setting at 96% on room air and is afebrile.  A CT scan of his chest that was performed did show no evidence of a pulmonary embolus.  He can eat from my standpoint today and I will have further discussions with the family as to their wishes.  Mcarthur Rossetti 03/01/2019, 10:16 AM

## 2019-03-01 NOTE — Progress Notes (Signed)
Pharmacy Antibiotic Note  Anthony [name] is a 85 y.o. male admitted on 03/01/2019 with sepsis.  Pharmacy has been consulted for vancomycin and zosyn dosing.  Plan: Zosyn 3.375g IV q8h (4 hour infusion).  Vancomycin 1500 mg IV q24 hours F/u renal function, cultures and clinical course  Height: 5' 10"  (177.8 cm) Weight: 175 lb (79.4 kg) IBW/kg (Calculated) : 73  Temp (24hrs), Avg:99.9 F (37.7 C), Min:99.7 F (37.6 C), Max:100.1 F (37.8 C)  Recent Labs  Lab 03/01/19 0117  WBC 13.1*  CREATININE 1.08    Estimated Creatinine Clearance: 53.5 mL/min (by C-G formula based on SCr of 1.08 mg/dL).    No Known Allergies  Thank you for allowing pharmacy to be a part of this patient's care.  Excell Seltzer Poteet 03/01/2019 4:11 AM

## 2019-03-01 NOTE — Telephone Encounter (Signed)
Patient's daughter called to return Dr. Trevor Mace call in regards to her father being seen at the hospital.  Her CB#[phone].  Thank you.

## 2019-03-01 NOTE — ED Notes (Signed)
Per EMS, facility notes pt febrile two days ago. Told that pt has been in the 98s for past 24hrs. Also placed on 2Lpm via [place] for reduced SpO2. Pt denies SOB.

## 2019-03-01 NOTE — Progress Notes (Signed)
[facility] PLAN OF CARE NOTE Patient: Anthony [name] GOT:[id number]   PCP: No primary care provider on file. DOB: February 23, 1934   DOA: 03/01/2019   DOS: 03/01/2019    Patient was admitted by my colleague Dr. Marlowe Sax earlier on 03/01/2019. I have reviewed the H&P as well as assessment and plan and agree with the same. Important changes in the plan are listed below.  Plan of care: Principal Problem:   Sepsis ([place]) Active Problems:   Suspected Covid-19 Virus Infection   Acute respiratory failure with hypoxia (HCC)   Hip fracture (HCC)   Fall   Displaced fracture of base of neck of left femur, initial encounter for closed fracture [facility]) Primary complaint is hip fracture. Low possibility of patient also having the COVID. Orthopedic is planning to do surgery after COVID is ruled out.  Agree with that. Continue droplet contact isolation.    Author: Berle Mull, MD [facility] 03/01/2019 12:44 PM   If 7PM-7AM, please contact night-coverage at [email or web address]

## 2019-03-01 NOTE — ED Notes (Signed)
Dr Leonides Schanz informed of troponin results .09

## 2019-03-01 NOTE — ED Notes (Signed)
ED TO INPATIENT HANDOFF REPORT  ED Nurse Name and Phone #: Nicki Reaper [place] Name/Age/Gender Anthony [name] 85 y.o. male Room/Bed: 026C/026C  Code Status   Code Status: DNR  Home/SNF/Other Nursing Home Patient oriented to: self, place, time and situation Is this baseline? Yes   Triage Complete: Triage complete  Chief Complaint fall fever  Triage Note Patient arrived via [facility] for pain to his left leg from Diagnosed Left Femur Fx.  Pt c/o 6 of 10 pain.  Pt has been febrile for past couple of days. (Left Femoral Neck Fx.)   Allergies No Known Allergies  Level of Care/Admitting Diagnosis ED Disposition    ED Disposition Condition [facility] Area: [place] [[id number]]  Level of Care: Telemetry Medical [104]  Diagnosis: Sepsis [facility]) [[id number]]  Admitting Physician: Shela Leff [[id number]]  Attending Physician: Shela Leff [[id number]]  Estimated length of stay: past midnight tomorrow  Certification:: I certify this patient will need inpatient services for at least 2 midnights  Bed request comments: Low risk COVID-19  PT Class (Do Not Modify): Inpatient [101]  PT Acc Code (Do Not Modify): Private [1]       B Medical/Surgery History Past Medical History:  Diagnosis Date  . Arthritis    rheumatoid-  Dr Barkley Boards  . CHF (congestive heart failure) (HCC)    chronic systolic heart failure per Dr [name] L[facility] note 09/23/11.   EKG 4/12, stress test  and cath from 8/12 on chart.  Clearance with LOV Dr [name] on chart  . Coronary artery disease   . Crohn disease ([place])   . Dysrhythmia    nonsustained,asymptomatic VT per Dr [name] office note  resulting in cath  . GERD (gastroesophageal reflux disease)   . Heart attack ([place]) 1996, 06/2011  . Hyperlipidemia   . Hypertension   . Pneumonia 1993  . Prostate troubles    states take alternating meds for bladder/prostate control- "age thing"  . Sleep apnea    sleep study years ago- has  apnea but did not qualify for CPAP   Past Surgical History:  Procedure Laterality Date  . CARDIAC CATHETERIZATION     1996/ 2012 report on chart  . COLONOSCOPY    . INGUINAL HERNIA REPAIR  11/04/2011   Procedure: HERNIA REPAIR INGUINAL ADULT;  Surgeon: Pedro Earls, MD;  Location: [facility];  Service: General;  Laterality: Right;  Right Inguinal Hernia Repair with Mesh  . KNEE ARTHROSCOPY Right 1976  . MENISCUS DEBRIDEMENT Left 1996  . TRIGGER FINGER RELEASE Right 10/10/2009  . UMBILICAL HERNIA REPAIR  2009  . WRIST SURGERY Left 04/25/91     A IV Location/Drains/Wounds Patient Lines/Drains/Airways Status   Active Line/Drains/Airways    Name:   Placement date:   Placement time:   Site:   Days:   Peripheral IV 03/01/19 Right;Distal Forearm   03/01/19    0108    Forearm   less than 1   Peripheral IV 03/01/19 Right Forearm   03/01/19    0412    Forearm   less than 1   External Urinary Catheter   04/05/18    0116    -   330   Incision 11/04/11 Abdomen Bilateral   11/04/11    1243     2674          Intake/Output Last 24 hours  Intake/Output Summary (Last 24 hours) at 03/01/2019 0851 Last data filed at 03/01/2019 0550 Gross per 24  hour  Intake 50 ml  Output -  Net 50 ml    Labs/Imaging Results for orders placed or performed during the hospital encounter of 03/01/19 (from the past 48 hour(s))  CBC with Differential     Status: Abnormal   Collection Time: 03/01/19  1:17 AM  Result Value Ref Range   WBC 13.1 (H) 4.0 - 10.5 K/uL   RBC 5.04 4.22 - 5.81 MIL/uL   Hemoglobin 14.2 13.0 - 17.0 g/dL   HCT 43.0 39.0 - 52.0 %   MCV 85.3 80.0 - 100.0 fL   MCH 28.2 26.0 - 34.0 pg   MCHC 33.0 30.0 - 36.0 g/dL   RDW 14.6 11.5 - 15.5 %   Platelets 144 (L) 150 - 400 K/uL   nRBC 0.0 0.0 - 0.2 %   Neutrophils Relative % 88 %   Neutro Abs 11.6 (H) 1.7 - 7.7 K/uL   Lymphocytes Relative 6 %   Lymphs Abs 0.8 0.7 - 4.0 K/uL   Monocytes Relative 4 %   Monocytes Absolute 0.5 0.1 - 1.0 K/uL    Eosinophils Relative 1 %   Eosinophils Absolute 0.2 0.0 - 0.5 K/uL   Basophils Relative 0 %   Basophils Absolute 0.0 0.0 - 0.1 K/uL   Immature Granulocytes 1 %   Abs Immature Granulocytes 0.09 (H) 0.00 - 0.07 K/uL    Comment: Performed at [facility], [address], [place], [place] [address]  Comprehensive metabolic panel     Status: Abnormal   Collection Time: 03/01/19  1:17 AM  Result Value Ref Range   Sodium 126 (L) 135 - 145 mmol/L   Potassium 4.1 3.5 - 5.1 mmol/L   Chloride 95 (L) 98 - 111 mmol/L   CO2 21 (L) 22 - 32 mmol/L   Glucose, Bld 104 (H) 70 - 99 mg/dL   BUN 15 8 - 23 mg/dL   Creatinine, Ser 1.08 0.61 - 1.24 mg/dL   Calcium 8.5 (L) 8.9 - 10.3 mg/dL   Total Protein 5.9 (L) 6.5 - 8.1 g/dL   Albumin 2.7 (L) 3.5 - 5.0 g/dL   AST 34 15 - 41 U/L   ALT 16 0 - 44 U/L   Alkaline Phosphatase 64 38 - 126 U/L   Total Bilirubin 1.7 (H) 0.3 - 1.2 mg/dL   GFR calc non Af Amer >60 >60 mL/min   GFR calc Af Amer >60 >60 mL/min   Anion gap 10 5 - 15    Comment: Performed at [facility], [place] [address], [place], [place] [address]  Brain natriuretic peptide     Status: Abnormal   Collection Time: 03/01/19  1:20 AM  Result Value Ref Range   B Natriuretic Peptide 1,631.3 (H) 0.0 - 100.0 pg/mL    Comment: Performed at [facility] [address], [place], [place] [address]  I-stat troponin, ED     Status: Abnormal   Collection Time: 03/01/19  1:27 AM  Result Value Ref Range   Troponin i, poc 0.09 (HH) 0.00 - 0.08 ng/mL   Comment NOTIFIED PHYSICIAN    Comment 3            Comment: Due to the release kinetics of cTnI, a negative result within the first hours of the onset of symptoms does not rule out myocardial infarction with certainty. If myocardial infarction is still suspected, repeat the test at appropriate intervals.   Type and screen [place]  Status: None   Collection Time: 03/01/19  3:38 AM  Result Value Ref Range   ABO/RH(D)  B POS    Antibody Screen NEG    Sample Expiration      03/04/2019 Performed at [facility], [place] [address] [place], [place] [address]   ABO/Rh     Status: None   Collection Time: 03/01/19  3:38 AM  Result Value Ref Range   ABO/RH(D)      B POS Performed at [facility] [address] [place], [place] [address]   CBC     Status: Abnormal   Collection Time: 03/01/19  5:09 AM  Result Value Ref Range   WBC 12.3 (H) 4.0 - 10.5 K/uL   RBC 5.06 4.22 - 5.81 MIL/uL   Hemoglobin 14.3 13.0 - 17.0 g/dL   HCT 43.6 39.0 - 52.0 %   MCV 86.2 80.0 - 100.0 fL   MCH 28.3 26.0 - 34.0 pg   MCHC 32.8 30.0 - 36.0 g/dL   RDW 14.7 11.5 - 15.5 %   Platelets 128 (L) 150 - 400 K/uL   nRBC 0.0 0.0 - 0.2 %    Comment: Performed at [facility], [place] [address] [place], [place] [address]  Osmolality     Status: Abnormal   Collection Time: 03/01/19  5:09 AM  Result Value Ref Range   Osmolality 273 (L) 275 - 295 mOsm/kg    Comment: SPECIMEN HEMOLYZED. HEMOLYSIS MAY AFFECT INTEGRITY OF RESULTS. REPEATED TO VERIFY Performed at [facility], [place] [address], [place], [place] [address]   Hepatic function panel     Status: Abnormal   Collection Time: 03/01/19  5:09 AM  Result Value Ref Range   Total Protein 6.0 (L) 6.5 - 8.1 g/dL   Albumin 2.6 (L) 3.5 - 5.0 g/dL   AST 36 15 - 41 U/L   ALT 18 0 - 44 U/L   Alkaline Phosphatase 56 38 - 126 U/L   Total Bilirubin 1.9 (H) 0.3 - 1.2 mg/dL   Bilirubin, Direct 0.5 (H) 0.0 - 0.2 mg/dL   Indirect Bilirubin 1.4 (H) 0.3 - 0.9 mg/dL    Comment: Performed at [facility] [address] [place], [place] [address]  Troponin I - Now Then Q6H     Status: Abnormal   Collection Time: 03/01/19  5:09 AM  Result Value Ref Range   Troponin I 0.06 (HH) <0.03 ng/mL    Comment: CRITICAL RESULT CALLED TO, READ BACK BY AND VERIFIED WITH: Augustina Mood [id number] [facility] Performed at [facility], [place] [address], [place], [place]  [address]   Procalcitonin     Status: None   Collection Time: 03/01/19  5:09 AM  Result Value Ref Range   Procalcitonin 0.89 ng/mL    Comment:        Interpretation: PCT > 0.5 ng/mL and <= 2 ng/mL: Systemic infection (sepsis) is possible, but other conditions are known to elevate PCT as well. SPECIMEN HEMOLYZED. HEMOLYSIS MAY AFFECT INTEGRITY OF RESULTS. (NOTE)       Sepsis PCT Algorithm           Lower Respiratory Tract                                      Infection PCT Algorithm    ----------------------------     ----------------------------  PCT < 0.25 ng/mL                PCT < 0.10 ng/mL         Strongly encourage             Strongly discourage   discontinuation of antibiotics    initiation of antibiotics    ----------------------------     -----------------------------       PCT 0.25 - 0.50 ng/mL            PCT 0.10 - 0.25 ng/mL               OR       >80% decrease in PCT            Discourage initiation of                                            antibiotics      Encourage discontinuation           of antibiotics    ----------------------------     -----------------------------          PCT >= 0.50 ng/mL              PCT 0.26 - 0.50 ng/mL               AND       <80% decrease in PCT             Encourage initiation of                                             antibiotics       Encourage continuation           of antibiotics    ----------------------------     -----------------------------        PCT >= 0.50 ng/mL                  PCT > 0.50 ng/mL               AND         increase in PCT                  Strongly encourage                                      initiation of antibiotics    Strongly encourage escalation           of antibiotics                                     -----------------------------                                           PCT <= 0.25 ng/mL  OR                                        >  80% decrease in PCT                                     Discontinue / Do not initiate                                             antibiotics Performed at [facility], [place] [address] , [place], [place] [address]   Ferritin     Status: Abnormal   Collection Time: 03/01/19  5:09 AM  Result Value Ref Range   Ferritin 399 (H) 24 - 336 ng/mL    Comment: Performed at [facility], [place] [address], [place], [place] [address]  Sedimentation rate     Status: Abnormal   Collection Time: 03/01/19  5:09 AM  Result Value Ref Range   Sed Rate 27 (H) 0 - 16 mm/hr    Comment: Performed at [facility] [address], [place], [place] [address]  C-reactive protein     Status: Abnormal   Collection Time: 03/01/19  5:09 AM  Result Value Ref Range   CRP 22.1 (H) <1.0 mg/dL    Comment: Performed at [facility] [address], [place], [place] [address]  Lactate dehydrogenase     Status: Abnormal   Collection Time: 03/01/19  5:09 AM  Result Value Ref Range   LDH 313 (H) 98 - 192 U/L    Comment: Performed at [facility], [place] [address] [place], [place] [address]  Basic metabolic panel     Status: Abnormal   Collection Time: 03/01/19  5:09 AM  Result Value Ref Range   Sodium 128 (L) 135 - 145 mmol/L   Potassium 4.1 3.5 - 5.1 mmol/L   Chloride 91 (L) 98 - 111 mmol/L   CO2 25 22 - 32 mmol/L   Glucose, Bld 103 (H) 70 - 99 mg/dL   BUN 16 8 - 23 mg/dL   Creatinine, Ser 1.06 0.61 - 1.24 mg/dL   Calcium 8.5 (L) 8.9 - 10.3 mg/dL   GFR calc non Af Amer >60 >60 mL/min   GFR calc Af Amer >60 >60 mL/min   Anion gap 12 5 - 15    Comment: Performed at [facility] [address], [place], [place] [address]  Protime-INR     Status: None   Collection Time: 03/01/19  6:19 AM  Result Value Ref Range   Prothrombin Time 14.5 11.4 - 15.2 seconds   INR 1.1 0.8 - 1.2    Comment: (NOTE) INR goal varies based on device and disease states. Performed at [facility], [place] [address], [place], [place] [address]   CBG monitoring, ED     Status: None   Collection Time: 03/01/19  8:08 AM  Result Value Ref Range   Glucose-Capillary 94 70 - 99 mg/dL   Ct Head Wo Contrast  Result Date: 03/01/2019 CLINICAL DATA:  Headache, post traumatic; C-spine fx, traumatic. Possible fall. EXAM: CT HEAD WITHOUT CONTRAST CT CERVICAL SPINE  WITHOUT CONTRAST TECHNIQUE: Multidetector CT imaging of the head and cervical spine was performed following the standard protocol without intravenous contrast. Multiplanar CT image reconstructions of the cervical spine were also generated. COMPARISON:  Head CT 11/05/2014 cervical spine MRI 05/29/2011 FINDINGS: CT HEAD FINDINGS Brain: Generalized atrophy. Ventriculomegaly is likely secondary to central atrophy and slightly progressed from 2015 CT. Progressive chronic small vessel ischemia. No intracranial hemorrhage. No evidence of acute infarct. No midline shift or mass effect. No subdural or extra-axial fluid collection. Vascular: Atherosclerosis of skullbase vasculature without hyperdense vessel or abnormal calcification. Skull: No fracture or focal lesion. Sinuses/Orbits: Paranasal sinuses and mastoid air cells are clear. The visualized orbits are unremarkable. Other: None. CT CERVICAL SPINE FINDINGS Alignment: Straightening of normal lordosis, mild leftward scoliotic curvature. Minimal anterolisthesis of C3 on C4 and C6 on C7 is likely degenerative and facet mediated. No traumatic subluxation. Skull base and vertebrae: No acute fracture. Dens is intact. Bony fragmentation and erosive change at the atlantodental interval is chronic and likely related to inflammatory arthropathy. Underlying heterogeneous bony mineralization suggest osteopenia. Soft tissues and spinal canal: No prevertebral fluid or swelling. No visible canal hematoma. Heterogeneous thyroid gland with enlargement of the left lobe, no well-defined nodule, similar appearance to 07/06/2014  chest CT. Disc levels: Near complete disc space loss at C5-C6, unchanged from prior. Disc space narrowing and endplate spurring at additional levels, most prominent C6-C7. Multilevel facet hypertrophy. Upper chest: No acute findings. Other: None. IMPRESSION: 1. No acute intracranial abnormality. No skull fracture. 2. Generalized atrophy and chronic small vessel ischemia, mildly progressed from 2015. 3. Multilevel degenerative a chronic change in the cervical spine without acute fracture or subluxation. Electronically Signed   By: Keith Rake M.D.   On: 03/01/2019 02:45   Ct Angio Chest Pe W And/or Wo Contrast  Result Date: 03/01/2019 CLINICAL DATA:  Femur fracture and fever. EXAM: CT ANGIOGRAPHY CHEST WITH CONTRAST TECHNIQUE: Multidetector CT imaging of the chest was performed using the standard protocol during bolus administration of intravenous contrast. Multiplanar CT image reconstructions and MIPs were obtained to evaluate the vascular anatomy. CONTRAST:  87m ISOVUE-370 IOPAMIDOL (ISOVUE-370) INJECTION 76% COMPARISON:  10/07/2018 FINDINGS: Cardiovascular: Satisfactory opacification of the pulmonary arteries to the segmental level. No evidence of pulmonary embolism. Normal heart size. No pericardial effusion. Atherosclerotic calcification of the aorta and coronaries. Mediastinum/Nodes: Negative for adenopathy. Nodular thyroid gland, especially on the left, stable. Lungs/Pleura: Mild increased density of the lungs likely related to expiratory phase imaging with atelectasis. Small discrete ground-glass opacity in the subpleural left upper lobe. Central airways are clear. No edema or effusion. Upper Abdomen: No acute finding Musculoskeletal: Remote T5 superior endplate fracture Review of the MIP images confirms the above findings. IMPRESSION: 1. Negative for pulmonary embolism. 2. Increased pulmonary density is likely from atelectasis on this expiratory phase scan. Small ground-glass nodule in the left,  cannot exclude early and infectious or inflammatory process in the setting of fever. Electronically Signed   By: JMonte FantasiaM.D.   On: 03/01/2019 04:58   Ct Cervical Spine Wo Contrast  Result Date: 03/01/2019 CLINICAL DATA:  Headache, post traumatic; C-spine fx, traumatic. Possible fall. EXAM: CT HEAD WITHOUT CONTRAST CT CERVICAL SPINE WITHOUT CONTRAST TECHNIQUE: Multidetector CT imaging of the head and cervical spine was performed following the standard protocol without intravenous contrast. Multiplanar CT image reconstructions of the cervical spine were also generated. COMPARISON:  Head CT 11/05/2014 cervical spine MRI 05/29/2011 FINDINGS: CT HEAD FINDINGS Brain: Generalized atrophy. Ventriculomegaly is likely  secondary to central atrophy and slightly progressed from 2015 CT. Progressive chronic small vessel ischemia. No intracranial hemorrhage. No evidence of acute infarct. No midline shift or mass effect. No subdural or extra-axial fluid collection. Vascular: Atherosclerosis of skullbase vasculature without hyperdense vessel or abnormal calcification. Skull: No fracture or focal lesion. Sinuses/Orbits: Paranasal sinuses and mastoid air cells are clear. The visualized orbits are unremarkable. Other: None. CT CERVICAL SPINE FINDINGS Alignment: Straightening of normal lordosis, mild leftward scoliotic curvature. Minimal anterolisthesis of C3 on C4 and C6 on C7 is likely degenerative and facet mediated. No traumatic subluxation. Skull base and vertebrae: No acute fracture. Dens is intact. Bony fragmentation and erosive change at the atlantodental interval is chronic and likely related to inflammatory arthropathy. Underlying heterogeneous bony mineralization suggest osteopenia. Soft tissues and spinal canal: No prevertebral fluid or swelling. No visible canal hematoma. Heterogeneous thyroid gland with enlargement of the left lobe, no well-defined nodule, similar appearance to 07/06/2014 chest CT. Disc levels:  Near complete disc space loss at C5-C6, unchanged from prior. Disc space narrowing and endplate spurring at additional levels, most prominent C6-C7. Multilevel facet hypertrophy. Upper chest: No acute findings. Other: None. IMPRESSION: 1. No acute intracranial abnormality. No skull fracture. 2. Generalized atrophy and chronic small vessel ischemia, mildly progressed from 2015. 3. Multilevel degenerative a chronic change in the cervical spine without acute fracture or subluxation. Electronically Signed   By: Keith Rake M.D.   On: 03/01/2019 02:45   Dg Chest Portable 1 View  Result Date: 03/01/2019 CLINICAL DATA:  Initial evaluation for acute hypoxia, low grade fever. EXAM: PORTABLE CHEST 1 VIEW COMPARISON:  Prior radiograph from 01/25/2019. FINDINGS: Transverse heart size at the upper limits of normal. Mediastinal silhouette within normal limits. Aortic atherosclerosis. Lungs hypoinflated. Mild linear left basilar atelectasis and/or scarring. No focal infiltrates. No edema or definite pleural effusion. No pneumothorax. No acute osseous finding. Osteopenia noted. IMPRESSION: 1. Shallow lung inflation with mild left basilar atelectasis and/or scarring. No other active cardiopulmonary disease identified. 2. Aortic atherosclerosis. Electronically Signed   By: Jeannine Boga M.D.   On: 03/01/2019 02:24   Dg Hip Unilat With Pelvis 2-3 Views Left  Result Date: 03/01/2019 CLINICAL DATA:  Initial evaluation for acute trauma, fall. EXAM: DG HIP (WITH OR WITHOUT PELVIS) 2-3V LEFT COMPARISON:  None. FINDINGS: Acute subcapital fracture of the left femoral neck with superior subluxation. Femoral head remains normally position within the acetabulum. Femoral head height maintained. Bony pelvis intact. Limited views of the right hip demonstrate no acute finding. Underlying osteoarthritic changes noted about the hips bilaterally, left greater than right. No soft tissue abnormality. IMPRESSION: Acute subcapital  fracture of the left femoral neck with superior subluxation. Electronically Signed   By: Jeannine Boga M.D.   On: 03/01/2019 02:25    Pending Labs Unresulted Labs (From admission, onward)    Start     Ordered   03/01/19 0821  Lactic acid, plasma  Once,   R     03/01/19 0821   03/01/19 0423  Sodium, urine, random  Add-on,   R     03/01/19 0422   03/01/19 0422  Osmolality, urine  Add-on,   R     03/01/19 0422   03/01/19 0359  Respiratory Panel by PCR  (Respiratory virus panel with precautions)  ONCE - STAT,   R     03/01/19 0358   03/01/19 0353  Troponin I - Now Then Q6H  Now then every 6 hours,   R  03/01/19 0354   03/01/19 0349  Novel Coronavirus, NAA (hospital order; send-out to ref lab)  ([facility] IP COVID ADMIT NOVEL CORONAVIRUS, NAA (HOSPITAL ORDER; SEND-OUT TO REF LAB))  Once,   R    Question:  Current symptoms  Answer:  Other (testing not indicated)   03/01/19 0354   03/01/19 0144  Blood culture (routine x 2)  BLOOD CULTURE X 2,   STAT     03/01/19 0143   03/01/19 0118  Urinalysis, Routine w reflex microscopic  ONCE - STAT,   R     03/01/19 0118   03/01/19 0118  Urine culture  ONCE - STAT,   STAT     03/01/19 0118          Vitals/Pain Today's Vitals   03/01/19 0530 03/01/19 0700 03/01/19 0715 03/01/19 0731  BP: 108/69 117/61 113/68   Pulse: (!) 102 (!) 102 100   Resp: (!) 22 19 17    Temp:      TempSrc:      SpO2: 91% 90% (!) 87%   Weight:      Height:      PainSc:    0-No pain    Isolation Precautions Droplet and Contact precautions  Medications Medications  acetaminophen (TYLENOL) tablet 650 mg (has no administration in time range)  pravastatin (PRAVACHOL) tablet 40 mg (has no administration in time range)  LORazepam (ATIVAN) tablet 0.5 mg (has no administration in time range)  magnesium oxide (MAG-OX) tablet 200 mg (has no administration in time range)  pantoprazole (PROTONIX) EC tablet 40 mg (has no administration in time range)  oxybutynin  (DITROPAN) tablet 5 mg (has no administration in time range)  tamsulosin (FLOMAX) capsule 0.4 mg (has no administration in time range)  vitamin B-12 (CYANOCOBALAMIN) tablet 500 mcg (has no administration in time range)  primidone (MYSOLINE) tablet 50 mg (has no administration in time range)  cholecalciferol (VITAMIN D3) tablet 2,000 Units (has no administration in time range)  vitamin A capsule 10,000 Units (has no administration in time range)  vitamin E capsule 400 Units (has no administration in time range)  heparin injection 5,000 Units (5,000 Units Subcutaneous Given 03/01/19 0702)  morphine 2 MG/ML injection 1 mg (1 mg Intravenous Given 03/01/19 0705)  insulin aspart (novoLOG) injection 0-9 Units (0 Units Subcutaneous Not Given 03/01/19 0730)  vancomycin (VANCOCIN) 1,500 mg in sodium chloride 0.9 % 500 mL IVPB (has no administration in time range)  piperacillin-tazobactam (ZOSYN) IVPB 3.375 g (has no administration in time range)  piperacillin-tazobactam (ZOSYN) IVPB 3.375 g (0 g Intravenous Stopped 03/01/19 0550)  vancomycin (VANCOCIN) 1,500 mg in sodium chloride 0.9 % 500 mL IVPB (0 mg Intravenous Stopped 03/01/19 0727)  iopamidol (ISOVUE-370) 76 % injection 75 mL (75 mLs Intravenous Contrast Given 03/01/19 0439)    Mobility walks with device High fall risk   Focused Assessments Pulmonary Assessment Handoff:  Lung sounds: Bilateral Breath Sounds: Clear, Diminished O2 Device: Room Air O2 Flow Rate (L/min): 2 L/min      R Recommendations: See Admitting Provider Note  Report given to:   Additional Notes:

## 2019-03-01 NOTE — Progress Notes (Signed)
Patient ID: Anthony [name], male   DOB: Apr 28, 1934, 85 y.o.   MRN: [id number] I have spoken to the patient's daughter in length in great detail.  We had a long and thorough discussion about the patient's situation.  She and her family feel that he would benefit from left hip surgery in order to decrease his pain and improve his quality of life so he can still at least sit up easier in bed and pivot to wheelchair or even potentially take steps.  She understands that he has been tested for COVID-19.  She would rather [place] hold off on surgery until those test results are known given his immunocompromise state of being on chronic methotrexate and other health issues.  I will keep in close contact with her.

## 2019-03-01 NOTE — Progress Notes (Signed)
Patient ID: Anthony [name], male   DOB: 06-05-1934, 85 y.o.   MRN: [id number] I did see the patient at the bedside in the emergency room.  He does have signs and symptoms consistent with a left hip displaced femoral neck fracture.  His x-rays also correlate with that.  He is right now on droplet and contact precautions pending a COVID-19 test.  The CT scan of his chest was negative for a pulmonary embolus.  We will not plan on surgery for today pending a thorough discussion with his family about his functional status and the goals of surgery as well as a thorough discussion of the operative and nonoperative treatment modalities.  I will let him eat today.  We will tentatively plan for surgery tomorrow if that correlates with the family's wishes.  Hopefully we will have more information if the COVID-19 test is turned around rapidly.  This certainly makes sense to wait because of surgical planning and operating room availability for surgery on patients suspicious or having COVID-19.  A full consult note will follow today.

## 2019-03-01 NOTE — ED Triage Notes (Signed)
Patient arrived via [facility] for pain to his left leg from Diagnosed Left Femur Fx.  Pt c/o 6 of 10 pain.  Pt has been febrile for past couple of days. (Left Femoral Neck Fx.)

## 2019-03-01 NOTE — ED Notes (Addendum)
Patient transported to CT, X-ray.

## 2019-03-01 NOTE — ED Notes (Signed)
Patient transported to CT 

## 2019-03-01 NOTE — ED Notes (Signed)
EMT putting in foley at this moment. Unable to draw blood at this time

## 2019-03-01 NOTE — Progress Notes (Signed)
Pt admitted from ED, oriented to unit and reason for Contact and Droplet by staff, verbalized understanding,

## 2019-03-01 NOTE — ED Provider Notes (Signed)
TIME SEEN: 1:07 AM  CHIEF COMPLAINT: Left hip fracture, fever, hypoxia  HPI: Patient is an 85 year old male with history of insulin-dependent diabetes, hypertension, hyperlipidemia, systolic heart failure who presents to the emergency department with complaints of a left hip fracture found on x-ray yesterday.  Reportedly patient had an unwitnessed fall 2 days ago.  X-ray obtained today shows an acute left femoral neck fracture.  Also had fever on April 6 that improved on April 7 had low-grade temperature here of 100.1.  Had a leukocytosis of 22,000 on April 6 but urine did not appear infected.  He was started on Rocephin.  He comes from [place] home.  Found to be hypoxic with EMS with sats in the upper 80s.  Does not wear oxygen chronically.  ROS: Level 5 caveat for altered mental status  PAST MEDICAL HISTORY/PAST SURGICAL HISTORY:  Past Medical History:  Diagnosis Date  . Arthritis    rheumatoid-  Dr Barkley Boards  . CHF (congestive heart failure) (HCC)    chronic systolic heart failure per Dr [name] [facility] note 09/23/11.   EKG 4/12, stress test  and cath from 8/12 on chart.  Clearance with [facility] Dr [name] on chart  . Coronary artery disease   . Crohn disease ([place])   . Dysrhythmia    nonsustained,asymptomatic VT per Dr [name] office note  resulting in cath  . GERD (gastroesophageal reflux disease)   . Heart attack ([place]) 1996, 06/2011  . Hyperlipidemia   . Hypertension   . Pneumonia 1993  . Prostate troubles    states take alternating meds for bladder/prostate control- "age thing"  . Sleep apnea    sleep study years ago- has apnea but did not qualify for CPAP    MEDICATIONS:  Prior to Admission medications   Medication Sig Start Date End Date Taking? Authorizing Provider  ALPRAZolam (XANAX) 0.25 MG tablet Take 0.25-0.5 mg by mouth at bedtime.     [provider]  calcium-vitamin D (OSCAL WITH D) 500-200 MG-UNIT tablet Take 1 tablet by mouth daily with breakfast.     [provider]  carvedilol (COREG) 6.25 MG tablet TAKE 1 TABLET TWICE A DAY WITH A MEAL. Patient taking differently: Take 3.125 mg by mouth 2 (two) times daily with a meal.  02/22/18   Burtis Junes, NP  Cholecalciferol (VITAMIN D3) 50 MCG (2000 UT) capsule Take 2,000 Units by mouth daily.     [provider]  Coenzyme Q10 (CO Q-10) 100 MG CAPS Take 100 mg by mouth every evening.     [provider]  Cyanocobalamin (B-12) 500 MCG TABS Take 500 mcg by mouth every evening.    [provider]  donepezil (ARICEPT) 5 MG tablet Take 5 mg by mouth at bedtime. 08/15/18   [provider]  feeding supplement, ENSURE ENLIVE, (ENSURE ENLIVE) LIQD Take 237 mLs by mouth 2 (two) times daily between meals. Patient not taking: Reported on 01/26/2019 04/07/18   Debbe Odea, MD  FLUoxetine (PROZAC) 20 MG capsule Take 20 mg by mouth daily.  09/06/16   [provider]  folic acid (FOLVITE) 570 MCG tablet Take 400 mcg by mouth every evening.     [provider]  guaiFENesin-dextromethorphan (ROBITUSSIN DM) 100-10 MG/5ML syrup Take 5 mLs by mouth every 4 (four) hours as needed for cough. Patient not taking: Reported on 11/08/2018 10/13/18   Arrien, Jimmy Picket, MD  insulin aspart (NOVOLOG) 100 UNIT/ML injection Inject 1-15 Units into the skin 3 (three) times  daily with meals. Sliding scale    [provider]  insulin glargine (LANTUS) 100 UNIT/ML injection Inject 25 Units into the skin daily.    [provider]  LORazepam (ATIVAN) 0.5 MG tablet Take 0.5 mg by mouth See admin instructions. Daily at 1600 (calm and tremors)    [provider]  magnesium oxide (MAG-OX) 400 MG tablet Take 200 mg by mouth daily.     [provider]  mesalamine (LIALDA) 1.2 G EC tablet Take 2.4 g by mouth 2 (two) times daily.     [provider]  nitroGLYCERIN (NITROSTAT) 0.4 MG SL tablet Place 0.4 mg under the tongue every 5 (five)  minutes as needed for chest pain.     [provider]  oxybutynin (DITROPAN) 5 MG tablet Take 5 mg by mouth 2 (two) times daily. 09/20/18   [provider]  pravastatin (PRAVACHOL) 40 MG tablet Take 1 tablet (40 mg total) by mouth at bedtime. 03/08/18   Belva Crome, MD  predniSONE (DELTASONE) 10 MG tablet Take 10 mg by mouth daily with breakfast.    [provider]  primidone (MYSOLINE) 50 MG tablet Take 50 mg by mouth at bedtime.    [provider]  ranitidine (ZANTAC) 300 MG capsule Take 300 mg by mouth every evening.    [provider]  tamsulosin (FLOMAX) 0.4 MG CAPS capsule Take 0.4 mg by mouth daily after supper.  10/01/14   [provider]  traMADol (ULTRAM) 50 MG tablet Take 50 mg by mouth every 6 (six) hours as needed for moderate pain.     [provider]  vitamin A 10000 UNIT capsule Take 10,000 Units by mouth every evening.     [provider]  vitamin E 400 UNIT capsule Take 400 Units by mouth every evening.     [provider]    ALLERGIES:  No Known Allergies  SOCIAL HISTORY:  Social History   Tobacco Use  . Smoking status: Former Smoker    Packs/day: 3.00    Years: 20.00    Pack years: 60.00    Types: Cigarettes    Last attempt to quit: 11/01/1970    Years since quitting: 48.3  . Smokeless tobacco: Never Used  . Tobacco comment: quit 1967  Substance Use Topics  . Alcohol use: No    FAMILY HISTORY: Family History  Problem Relation Age of Onset  . Heart disease Mother   . Rheum arthritis Mother   . Emphysema Father     EXAM: BP 115/69 (BP Location: Left Arm)   Pulse (!) 104   Temp (S) 100.1 F (37.8 C) (Oral)   Resp (!) 26   Ht 5' 10"  (1.778 m)   Wt 79.4 kg   SpO2 92%   BMI 25.11 kg/m  CONSTITUTIONAL: Alert and oriented to person and place but not time.  Patient is elderly but in no distress HEAD: Normocephalic; atraumatic EYES: Conjunctivae clear, PERRL, EOMI ENT:  normal nose; no rhinorrhea; moist mucous membranes; pharynx without lesions noted; no dental injury; no septal hematoma NECK: Supple, no meningismus, no LAD; no midline spinal tenderness, step-off or deformity; trachea midline, patient in cervical collar CARD: RRR; S1 and S2 appreciated; no murmurs, no clicks, no rubs, no gallops RESP: Normal chest excursion without splinting or tachypnea; breath sounds clear and equal bilaterally; no wheezes, no rhonchi, no rales; no hypoxia or respiratory distress CHEST:  chest wall stable, no crepitus or ecchymosis or deformity, nontender to  palpation; no flail chest ABD/GI: Normal bowel sounds; non-distended; soft, non-tender, no rebound, no guarding; no ecchymosis or other lesions noted PELVIS:  stable, nontender to palpation BACK:  The back appears normal and is non-tender to palpation, there is no CVA tenderness; no midline spinal tenderness, step-off or deformity EXT: No to palpation over the left hip in this leg is slightly shortened and internally rotated, 2+ DP pulses bilaterally, no tenderness over his other extremities or anywhere else in the left leg.  There is no ecchymosis, erythema or warmth.  His compartments are all soft.  He has normal capillary refill. SKIN: Normal color for age and race; warm NEURO: Moves all extremities equally other than left leg secondary to pain, appears to have normal speech and no facial asymmetry, demented  MEDICAL DECISION MAKING: Patient here after fall.  I talked to [place] at [facility].  She states that they are not sure when the patient fell.  She states that there are "rumors" that the patient fell and called his daughter to come pick him up.  The daughter then called a CNA who helped him off the floor but this was not documented in his chart.  He began to complain of left hip pain yesterday and an x-ray was obtained.  She states that 2 days ago he has had a fever and has been on Rocephin.  Does not wear oxygen  chronically.  Normally ambulates with one-to-one assistance.  Does not use a cane or walker.  Does not appear that he is on blood thinners.  I have also tried to contact his daughter, [name], and left a message.  It appears patient is a DNR.  ED PROGRESS:   Called daughter [name] at [phone].  She states patient has been seen by Dr. Marlou Sa with orthopedics previously and that is who they would like to see again.  She confirms patient is a DNR/DNI but they agreed to hospitalization and surgery as needed.  Daughter reports that his fall occurred 5 days ago.  She states that her father called her and told her he was on the floor and she called the nursing home to help pick him up off the floor.    3:00 AM  CT head and cervical spine show no acute injury.  C-collar has been removed.  Patient denies any pain at this time and declines pain medication.  Discussed case with Dr. Ninfa Linden on-call for [facility] orthopedics.  They will see patient in consult.  Will admit to medicine.  PCP Dr. Inda Merlin with [facility] physicians.   3:15 AM  Pt continues to be slightly tachycardic, tachypneic and hypoxic.  His chest x-ray shows no acute abnormality.  Daughter reports that he does not get up out of bed often at his nursing facility and therefore would be very high risk for a pulmonary embolus.  I feel he needs a CTA of his chest for further evaluation of his new oxygen requirement.  He also has a mildly elevated troponin.   Discussed patient's case with hospitalist, Dr. Marlowe Sax.  I have recommended admission and patient (and family if present) agree with this plan. Admitting physician will place admission orders.   I reviewed all nursing notes, vitals, pertinent previous records, EKGs, lab and urine results, imaging (as available).   CT shows no pulmonary embolus but does show 1 groundglass opacity that could be atelectasis versus pneumonia in the setting of fever and hypoxia.  Hospitalist has ordered broad-spectrum  antibiotics and novel coronavirus testing.  Anthony [name] was evaluated in Emergency Department on 03/01/2019 for the symptoms described in the history of present illness. He was evaluated in the context of the global COVID-19 pandemic, which necessitated consideration that the patient might be at risk for infection with the SARS-CoV-2 virus that causes COVID-19. Institutional protocols and algorithms that pertain to the evaluation of patients at risk for COVID-19 are in a state of rapid change based on information released by regulatory bodies including the CDC and federal and state organizations. These policies and algorithms were followed during the patient's care in the ED.    EKG Interpretation  Date/Time:  Wednesday March 01 2019 01:16:30 EDT Ventricular Rate:  101 PR Interval:    QRS Duration: 108 QT Interval:  383 QTC Calculation: 497 R Axis:   -79 Text Interpretation:  Sinus arrhythmia Ventricular premature complex Left anterior fascicular block Abnormal R-wave progression, late transition Borderline prolonged QT interval No significant change since last tracing Confirmed by Azaryah Oleksy, Cyril Mourning [phone]) on 03/01/2019 1:19:03 AM         Keion Neels, Delice Bison, DO 03/01/19 [id number]

## 2019-03-01 NOTE — ED Notes (Signed)
Condom cath placed for urine collection.

## 2019-03-02 ENCOUNTER — Encounter (HOSPITAL_COMMUNITY): Payer: Self-pay | Admitting: *Deleted

## 2019-03-02 DIAGNOSIS — J9601 Acute respiratory failure with hypoxia: Secondary | ICD-10-CM

## 2019-03-02 LAB — COMPREHENSIVE METABOLIC PANEL
ALT: 15 U/L (ref 0–44)
AST: 17 U/L (ref 15–41)
Albumin: 2.4 g/dL — ABNORMAL LOW (ref 3.5–5.0)
Alkaline Phosphatase: 52 U/L (ref 38–126)
Anion gap: 12 (ref 5–15)
BUN: 15 mg/dL (ref 8–23)
CO2: 22 mmol/L (ref 22–32)
Calcium: 7.9 mg/dL — ABNORMAL LOW (ref 8.9–10.3)
Chloride: 94 mmol/L — ABNORMAL LOW (ref 98–111)
Creatinine, Ser: 0.99 mg/dL (ref 0.61–1.24)
GFR calc Af Amer: >60 mL/min (ref >60)
GFR calc non Af Amer: >60 mL/min (ref >60)
Glucose, Bld: 120 mg/dL — ABNORMAL HIGH (ref 70–99)
Potassium: 3.4 mmol/L — ABNORMAL LOW (ref 3.5–5.1)
Sodium: 128 mmol/L — ABNORMAL LOW (ref 135–145)
Total Bilirubin: 1.4 mg/dL — ABNORMAL HIGH (ref 0.3–1.2)
Total Protein: 5.6 g/dL — ABNORMAL LOW (ref 6.5–8.1)

## 2019-03-02 LAB — CBC WITH DIFFERENTIAL/PLATELET
Abs Immature Granulocytes: 0.06 10*3/uL (ref 0.00–0.07)
Basophils Absolute: 0 10*3/uL (ref 0.0–0.1)
Basophils Relative: 0 %
Eosinophils Absolute: 0.5 10*3/uL (ref 0.0–0.5)
Eosinophils Relative: 5 %
HCT: 36.5 % — ABNORMAL LOW (ref 39.0–52.0)
Hemoglobin: 12.7 g/dL — ABNORMAL LOW (ref 13.0–17.0)
Immature Granulocytes: 1 %
Lymphocytes Relative: 8 %
Lymphs Abs: 0.8 10*3/uL (ref 0.7–4.0)
MCH: 29.1 pg (ref 26.0–34.0)
MCHC: 34.8 g/dL (ref 30.0–36.0)
MCV: 83.7 fL (ref 80.0–100.0)
Monocytes Absolute: 0.7 10*3/uL (ref 0.1–1.0)
Monocytes Relative: 7 %
Neutro Abs: 7.5 10*3/uL (ref 1.7–7.7)
Neutrophils Relative %: 79 %
Platelets: 141 10*3/uL — ABNORMAL LOW (ref 150–400)
RBC: 4.36 MIL/uL (ref 4.22–5.81)
RDW: 14.8 % (ref 11.5–15.5)
WBC: 9.6 10*3/uL (ref 4.0–10.5)
nRBC: 0 % (ref 0.0–0.2)

## 2019-03-02 LAB — URINALYSIS, ROUTINE W REFLEX MICROSCOPIC
Bilirubin Urine: NEGATIVE
Glucose, UA: NEGATIVE mg/dL
Ketones, ur: NEGATIVE mg/dL
Leukocytes,Ua: NEGATIVE
Nitrite: NEGATIVE
Protein, ur: NEGATIVE mg/dL
Specific Gravity, Urine: 1.026 (ref 1.005–1.030)
pH: 5 (ref 5.0–8.0)

## 2019-03-02 LAB — MAGNESIUM: Magnesium: 1.7 mg/dL (ref 1.7–2.4)

## 2019-03-02 LAB — MRSA PCR SCREENING: MRSA by PCR: NEGATIVE

## 2019-03-02 LAB — GLUCOSE, CAPILLARY
Glucose-Capillary: 103 mg/dL — ABNORMAL HIGH (ref 70–99)
Glucose-Capillary: 108 mg/dL — ABNORMAL HIGH (ref 70–99)
Glucose-Capillary: 118 mg/dL — ABNORMAL HIGH (ref 70–99)

## 2019-03-02 MED ORDER — POTASSIUM CHLORIDE CRYS ER 20 MEQ PO TBCR
40.0000 meq | EXTENDED_RELEASE_TABLET | Freq: Once | ORAL | Status: AC
  Administered 2019-03-02: 09:00:00 40 meq via ORAL
  Filled 2019-03-02: qty 2

## 2019-03-02 MED ORDER — AZITHROMYCIN 250 MG PO TABS
500.0000 mg | ORAL_TABLET | Freq: Every day | ORAL | Status: AC
  Administered 2019-03-02 – 2019-03-06 (×4): 500 mg via ORAL
  Filled 2019-03-02 (×4): qty 2

## 2019-03-02 MED ORDER — SODIUM CHLORIDE 0.9 % IV SOLN
1.0000 g | INTRAVENOUS | Status: DC
  Administered 2019-03-02: 09:00:00 1 g via INTRAVENOUS
  Filled 2019-03-02 (×3): qty 10

## 2019-03-02 NOTE — TOC Initial Note (Signed)
Transition of Care [facility]) - Initial/Assessment Note    Patient Details  Name: Anthony [name] MRN: [id number] Date of Birth: [birth year]  [facility] of [facility]) CM/SW Contact:    Alberteen Sam, LCSW Phone Number: 03/02/2019, 1:05 PM  Clinical Narrative:                  CSW consulted with patient's daughter [name] as patient is disoriented x3. [name] reports patient has been at [facility] for the past month. She states patient had previously gone to the ED a month ago and insurance would not cover skilled after, therefore they have been paying for [facility] SNF out of pocket. [name] reports family has paid up until 4/10 for patient to stay at [facility]. [name] is hopeful of SNF recommendation once patient has surgery and works with PT, and then insurance will cover his next stay as they have no more funds to continue to pay out of pocket for [facility]. [name] reports [facility] is holding his things at the facility and are hopeful he will return and be covered under insurance at discharge. CSW will continue to keep patient's daughter [name] and Alden Benjamin up to date on Covid results, potential surgery and PT recommendations.   Expected Discharge Plan: Skilled Nursing Facility Barriers to Discharge: No Barriers Identified   Patient Goals and CMS Choice Patient states their goals for this hospitalization and ongoing recovery are:: to go back to [email or web address] Compare Post Acute Care list provided to:: Patient Represenative (must comment) Choice offered to / list presented to : Adult Children(daughter [name])  Expected Discharge Plan and Services Expected Discharge Plan: [place]   Discharge Planning Services: NA Post Acute Care Choice: [place] Living arrangements for the past 2 months: Skilled Nursing Facility([facility])                 DME Arranged: N/A DME Agency: NA HH Arranged: NA [place] Agency: NA  Prior Living  Arrangements/Services Living arrangements for the past 2 months: Skilled Nursing Facility([facility]) Lives with:: Self Patient language and need for interpreter reviewed:: Yes Do you feel safe going back to the place where you live?: Yes      Need for Family Participation in Patient Care: Yes (Comment) Care giver support system in place?: Yes (comment)   Criminal Activity/Legal Involvement Pertinent to Current Situation/Hospitalization: No - Comment as needed  Activities of Daily Living      Permission Sought/Granted Permission sought to share information with : Case Manager, Customer service manager, Family Supports Permission granted to share information with : Yes, Verbal Permission Granted  Share Information with NAME: [name]  Permission granted to share info w AGENCY: SNFs  Permission granted to share info w Relationship: daughter  Permission granted to share info w Contact Information: [phone]  Emotional Assessment Appearance:: Appears stated age Attitude/Demeanor/Rapport: Unable to Assess Affect (typically observed): Unable to Assess Orientation: : Oriented to Self Alcohol / Substance Use: Not Applicable Psych Involvement: No (comment)  Admission diagnosis:  Fracture [T14.8XXA] Hyponatremia [E87.1] Hypoxia [R09.02] Subcapital fracture of hip, left, closed, initial encounter ([place]) [S72.012A] Sepsis ([place]) [A41.9] Patient Active Problem List   Diagnosis Date Noted  . Suspected Covid-19 Virus Infection 03/01/2019  . Acute respiratory failure with hypoxia ([place]) 03/01/2019  . Hip fracture ([place]) 03/01/2019  . Fall 03/01/2019  . Displaced fracture of base of neck of left femur, initial encounter for closed fracture ([place])   . Fever   . Nonspecific interstitial pneumonia ([place])   .  Febrile illness   . Thrush   . Hypoxia   . Sepsis ([place]) 10/04/2018  . Fatty liver 04/06/2018  . Acute pancreatitis 04/04/2018  . Abdominal pain, right lower quadrant 05/28/2015  .  Absolute anemia 05/28/2015  . Aortic atherosclerosis ([place]) 05/28/2015  . Altered blood in stool 05/28/2015  . Chronic systolic heart failure ([place]) 05/28/2015  . Syncope and collapse 05/28/2015  . Arteriosclerosis of coronary artery 05/28/2015  . Cough 05/28/2015  . Crohn's disease of large bowel ([place]) 05/28/2015  . D (diarrhea) 05/28/2015  . Displacement of cervical intervertebral disc without myelopathy 05/28/2015  . Benign essential HTN 05/28/2015  . Adenopathy 05/28/2015  . Hypercholesterolemia without hypertriglyceridemia 05/28/2015  . Hypo-osmolality and hyponatremia 05/28/2015  . Postinflammatory pulmonary fibrosis ([place]) 05/28/2015  . Healed myocardial infarct 05/28/2015  . Peptic esophagitis 05/28/2015  . 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 Paroxysmal ventricular tachycardia ([place]) 05/28/2015  . Decreased body weight 05/28/2015  . Weakness 11/05/2014  . Nausea with vomiting 11/05/2014  . Generalized weakness   . Dehydration   . Difficulty walking   . Pulmonary infiltrates 07/16/2014  . Chronic combined systolic and diastolic heart failure ([place]) 05/31/2014  . Essential hypertension 05/31/2014  .  H/O Right inguinal hernia repair 01/15/2012  . Small fat containing right inguinal hernia that has been painful 08/05/2011  . Finger wound, simple, open 10/12/2008  . Benign prostatic hyperplasia without urinary obstruction 04/30/2008  . Synovitis and tenosynovitis 04/30/2008  . HLD (hyperlipidemia) 04/12/2008  . COLONIC POLYPS 11/21/2007  . MI 11/21/2007  . Coronary atherosclerosis 11/21/2007  . GERD 11/21/2007  . Rheumatoid arthritis ([place]) 11/21/2007  . SLEEP APNEA, MILD 11/21/2007   PCP:  No primary care provider on file. Pharmacy:  No Pharmacies Listed    Social Determinants of Health (SDOH) Interventions    Readmission Risk Interventions No flowsheet data found.

## 2019-03-02 NOTE — Progress Notes (Signed)
[facility] Progress Note  Patient: Anthony [name] GHW:[id number]   PCP: No primary care provider on file. DOB: 04-12-1934   DOA: 03/01/2019   DOS: 03/02/2019   Date of Service: the patient was seen and examined on 03/02/2019  Brief hospital course: Pt. with PMH of rheumatoid arthritis, CAD, Crohn's disease, IDDM, HTN, HLD, chronic systolic CHF; admitted on 03/01/2019, presented with complaint of fall, was found to have left hip fracture. Currently further plan is continue current management.  Subjective: Denies any acute complaint.  Per RN oxygenation drops while doing any activity or at night.  Occasionally patient is confused and occasionally lethargic.  At the time of my evaluation alert over no awake following all commands appears comfortable.  Assessment and Plan: 1.  Left femoral neck fracture Unwitnessed fall bili on 02/27/2019. Came to the ER on 02/28/2019 night. X-ray was positive. Orthopedic consulted appreciate assistance. Currently pain controlled with morphine. Awaiting pending work-up for COVID before planning surgery.  2. Preoperative medical evaluation No Coronary revascularization/CVA within 5 years. No Recent stress test. Does not Climb flight of stair, participates in recreational activity,does household chores. No Prior adverse event with anesthesia. No Alcohol use, drug use.  A) Cardiac risk: Based on RCRI  >History of HF  With this the patient is a mild risk for adverse Cardiac outcome from surgery. Recommend no further cardiac work up  Be watchful of hydration since the pt has history of CHF. Monitor Ins and Out.  other recommendations Given the concerning findings of the recent article in patient with COVID who has undergone surgery would recommend to rule out COVID before surgery.   3.  Rule out COVID. Had an episode of fever on 02/27/2019. Currently hypoxic. CT scan does not show typical classic groundglass opacities are patchy ARDS-like picture. No  further fever here in the hospital. COVID testing ordered on admission currently pending. Continue droplet contact precaution.  4.  SIRS criteria met on admission, ruled out for sepsis. Possible H CAP Acute hypoxic respiratory failure. Does not use oxygen at baseline. Currently requires 2 L of oxygen. CT PE protocol negative for pulmonary embolism. Possible atelectasis on initial CT scan on the admission. Small groundglass nodule in the left lower lobe. Started on IV vancomycin and Zosyn on admission.  Blood cultures are negative. We will narrow down the antibiotic further. Monitor cultures for now.  5. Hyponatremia. Sodium 128. Likely secondary to poor p.o. intake. Will monitor.  6.  Hypokalemia. Replace.  7.  Dementia. No behavioral disturbances. Sundowning. Currently monitoring the patient. Continuing Ativan which the patient has been using at home.  8.  Chronic systolic CHF. Does not appear to be volume moderate for now. Patient actually received IV fluids at the time of admission. Holding Coreg for now. Patient is on 40 mg daily Lasix.  Currently on hold.  9.  Overactive bladder. On Ditropan 5 mg twice daily. Continues to remain having delirium we may have to discontinue this medication.  10 history of rheumatoid arthritis. History of methotrexate use. Currently not on any medication. We will monitor.  Diet: regular diet DVT Prophylaxis: subcutaneous Heparin  Advance goals of care discussion: DNR DNI  Family Communication: no family was present at bedside, at the time of interview. The pt provided permission to discuss medical plan with the family. D/w daughter on the phone, Opportunity was given to ask question and all questions were answered satisfactorily.   Disposition:  Discharge to SNF.  Consultants: orthopedics  Procedures: none  Scheduled Meds: . azithromycin  500 mg Oral Daily  . cholecalciferol  2,000 Units Oral Daily  . heparin  5,000 Units  Subcutaneous Q8H  . insulin aspart  0-9 Units Subcutaneous TID WC  . magnesium oxide  200 mg Oral Daily  . oxybutynin  5 mg Oral BID  . pantoprazole  40 mg Oral Daily  . pravastatin  40 mg Oral QHS  . primidone  50 mg Oral QHS  . tamsulosin  0.4 mg Oral QPC supper   Continuous Infusions: . cefTRIAXone (ROCEPHIN)  IV Stopped (03/02/19 0950)   PRN Meds: acetaminophen, LORazepam, morphine injection Antibiotics: Anti-infectives (From admission, onward)   Start     Dose/Rate Route Frequency Ordered Stop   03/02/19 1000  azithromycin (ZITHROMAX) tablet 500 mg     500 mg Oral Daily 03/02/19 0751     03/02/19 0800  cefTRIAXone (ROCEPHIN) 1 g in sodium chloride 0.9 % 100 mL IVPB     1 g 200 mL/hr over 30 Minutes Intravenous Every 24 hours 03/02/19 0751     03/02/19 0500  vancomycin (VANCOCIN) 1,500 mg in sodium chloride 0.9 % 500 mL IVPB  Status:  Discontinued     1,500 mg 250 mL/hr over 120 Minutes Intravenous Every 24 hours 03/01/19 0416 03/02/19 0750   03/01/19 1200  piperacillin-tazobactam (ZOSYN) IVPB 3.375 g  Status:  Discontinued     3.375 g 12.5 mL/hr over 240 Minutes Intravenous Every 8 hours 03/01/19 0416 03/02/19 0754   03/01/19 0415  piperacillin-tazobactam (ZOSYN) IVPB 3.375 g     3.375 g 100 mL/hr over 30 Minutes Intravenous  Once 03/01/19 0411 03/01/19 0550   03/01/19 0415  vancomycin (VANCOCIN) 1,500 mg in sodium chloride 0.9 % 500 mL IVPB     1,500 mg 250 mL/hr over 120 Minutes Intravenous  Once 03/01/19 0411 03/01/19 0727       Objective: Physical Exam: Vitals:   03/02/19 1004 03/02/19 1104 03/02/19 1132 03/02/19 1604  BP: 107/64 107/69  113/72  Pulse: 96 95  95  Resp: (!) 22 20  20   Temp:   98.4 F (36.9 C) (!) 97.4 F (36.3 C)  TempSrc:   Oral Oral  SpO2: 95% 96%  97%  Weight:      Height:        Intake/Output Summary (Last 24 hours) at 03/02/2019 1713 Last data filed at 03/02/2019 1500 Gross per 24 hour  Intake 1328.54 ml  Output 500 ml  Net 828.54 ml    Filed Weights   03/01/19 0055  Weight: 79.4 kg   General: Alert, Awake and Oriented to Time, Place and Person. Appear in mild distress, affect appropriate Eyes: PERRL, Conjunctiva normal ENT: Oral Mucosa clear moist Neck: no JVD, no Abnormal Mass Or lumps Cardiovascular: S1 and S2 Present, no Murmur, Peripheral Pulses Present Respiratory: normal respiratory effort, Bilateral Air entry equal and Decreased, no use of accessory muscle, Clear to Auscultation, no Crackles, no wheezes Abdomen: Bowel Sound present, Soft and no tenderness, no hernia Skin: no redness, no Rash, no induration Extremities: no Pedal edema, no calf tenderness Neurologic: Grossly no focal neuro deficit. Bilaterally Equal motor strength  Data Reviewed: CBC: Recent Labs  Lab 03/01/19 0117 03/01/19 0509 03/02/19 0418  WBC 13.1* 12.3* 9.6  NEUTROABS 11.6*  --  7.5  HGB 14.2 14.3 12.7*  HCT 43.0 43.6 36.5*  MCV 85.3 86.2 83.7  PLT 144* 128* 090*   Basic Metabolic Panel: Recent Labs  Lab 03/01/19 0117 03/01/19 0509 03/02/19  0418  NA 126* 128* 128*  K 4.1 4.1 3.4*  CL 95* 91* 94*  CO2 21* 25 22  GLUCOSE 104* 103* 120*  BUN 15 16 15   CREATININE 1.08 1.06 0.99  CALCIUM 8.5* 8.5* 7.9*  MG  --   --  1.7    Liver Function Tests: Recent Labs  Lab 03/01/19 0117 03/01/19 0509 03/02/19 0418  AST 34 36 17  ALT 16 18 15   ALKPHOS 64 56 52  BILITOT 1.7* 1.9* 1.4*  PROT 5.9* 6.0* 5.6*  ALBUMIN 2.7* 2.6* 2.4*   No results for input(s): LIPASE, AMYLASE in the last 168 hours. No results for input(s): AMMONIA in the last 168 hours. Coagulation Profile: Recent Labs  Lab 03/01/19 0619  INR 1.1   Cardiac Enzymes: Recent Labs  Lab 03/01/19 0509 03/01/19 0943 03/01/19 1610  TROPONINI 0.06* 0.05* 0.05*   BNP (last 3 results) No results for input(s): PROBNP in the last 8760 hours. CBG: Recent Labs  Lab 03/01/19 1719 03/01/19 2206 03/02/19 0815 03/02/19 1126 03/02/19 1630  GLUCAP 123* 159*  103* 108* 118*   Studies: No results found.   Time spent: 35 minutes  Author: Berle Mull, MD [facility] 03/02/2019 5:13 PM  To reach On-call, see care teams to locate the attending and reach out to them via [email or web address]. If 7PM-7AM, please contact night-coverage If you still have difficulty reaching the attending provider, please page the [facility] (Director on Call) for [facility] on amion for assistance.

## 2019-03-02 NOTE — Progress Notes (Signed)
Patient ID: Anthony [name], male   DOB: 02-Jun-1934, 85 y.o.   MRN: [id number] The patient appears comfortable with stable vital signs.  He is on minimal O2 requirements.  His left hip is still quite painful.  We are awaiting his COVID-19 test results prior to proceeding to surgery to address his hip fracture.  Hopefully the results will be back later today but most likely tomorrow and then we can come up with a surgical plan and timing for surgery.  I spoke with his daughter on speaker phone at the bedside with the patient as well as his wife.  They understand fully what is going on and our rationale behind delaying surgery until we know the results of his COVID-19 test.  Thus far he is comfortable in the bed.  We will continue to follow him closely.

## 2019-03-03 LAB — CBC WITH DIFFERENTIAL/PLATELET
Abs Immature Granulocytes: 0.07 10*3/uL (ref 0.00–0.07)
Basophils Absolute: 0 10*3/uL (ref 0.0–0.1)
Basophils Relative: 0 %
Eosinophils Absolute: 0.5 10*3/uL (ref 0.0–0.5)
Eosinophils Relative: 5 %
HCT: 34.7 % — ABNORMAL LOW (ref 39.0–52.0)
Hemoglobin: 12 g/dL — ABNORMAL LOW (ref 13.0–17.0)
Immature Granulocytes: 1 %
Lymphocytes Relative: 8 %
Lymphs Abs: 0.8 10*3/uL (ref 0.7–4.0)
MCH: 29.3 pg (ref 26.0–34.0)
MCHC: 34.6 g/dL (ref 30.0–36.0)
MCV: 84.6 fL (ref 80.0–100.0)
Monocytes Absolute: 0.9 10*3/uL (ref 0.1–1.0)
Monocytes Relative: 9 %
Neutro Abs: 7.1 10*3/uL (ref 1.7–7.7)
Neutrophils Relative %: 77 %
Platelets: 158 10*3/uL (ref 150–400)
RBC: 4.1 MIL/uL — ABNORMAL LOW (ref 4.22–5.81)
RDW: 14.6 % (ref 11.5–15.5)
WBC: 9.4 10*3/uL (ref 4.0–10.5)
nRBC: 0 % (ref 0.0–0.2)

## 2019-03-03 LAB — COMPREHENSIVE METABOLIC PANEL
ALT: 23 U/L (ref 0–44)
AST: 31 U/L (ref 15–41)
Albumin: 2.4 g/dL — ABNORMAL LOW (ref 3.5–5.0)
Alkaline Phosphatase: 56 U/L (ref 38–126)
Anion gap: 11 (ref 5–15)
BUN: 10 mg/dL (ref 8–23)
CO2: 23 mmol/L (ref 22–32)
Calcium: 8.1 mg/dL — ABNORMAL LOW (ref 8.9–10.3)
Chloride: 97 mmol/L — ABNORMAL LOW (ref 98–111)
Creatinine, Ser: 0.86 mg/dL (ref 0.61–1.24)
GFR calc Af Amer: >60 mL/min (ref >60)
GFR calc non Af Amer: >60 mL/min (ref >60)
Glucose, Bld: 127 mg/dL — ABNORMAL HIGH (ref 70–99)
Potassium: 3.4 mmol/L — ABNORMAL LOW (ref 3.5–5.1)
Sodium: 131 mmol/L — ABNORMAL LOW (ref 135–145)
Total Bilirubin: 1 mg/dL (ref 0.3–1.2)
Total Protein: 5.2 g/dL — ABNORMAL LOW (ref 6.5–8.1)

## 2019-03-03 LAB — NOVEL CORONAVIRUS, NAA (HOSP ORDER, SEND-OUT TO REF LAB; TAT 18-24 HRS): SARS-CoV-2, NAA: NOT DETECTED

## 2019-03-03 LAB — GLUCOSE, CAPILLARY
Glucose-Capillary: 102 mg/dL — ABNORMAL HIGH (ref 70–99)
Glucose-Capillary: 107 mg/dL — ABNORMAL HIGH (ref 70–99)
Glucose-Capillary: 126 mg/dL — ABNORMAL HIGH (ref 70–99)
Glucose-Capillary: 143 mg/dL — ABNORMAL HIGH (ref 70–99)

## 2019-03-03 LAB — MAGNESIUM: Magnesium: 1.9 mg/dL (ref 1.7–2.4)

## 2019-03-03 MED ORDER — CEFDINIR 300 MG PO CAPS
300.0000 mg | ORAL_CAPSULE | Freq: Two times a day (BID) | ORAL | Status: DC
  Administered 2019-03-03 – 2019-03-08 (×9): 300 mg via ORAL
  Filled 2019-03-03 (×12): qty 1

## 2019-03-03 MED ORDER — MAGNESIUM OXIDE 400 (241.3 MG) MG PO TABS
200.0000 mg | ORAL_TABLET | Freq: Every day | ORAL | Status: DC
  Administered 2019-03-04 – 2019-03-07 (×4): 200 mg via ORAL
  Filled 2019-03-03 (×4): qty 1

## 2019-03-03 NOTE — Progress Notes (Signed)
[facility] Progress Note  Patient: Anthony [name] ZOX:[id number]   PCP: No primary care provider on file. DOB: [birth year]   DOA: 03/01/2019   DOS: 03/03/2019   Date of Service: the patient was seen and examined on 03/03/2019  Brief hospital course: Pt. with PMH of rheumatoid arthritis, CAD, Crohn's disease, IDDM, HTN, HLD, chronic systolic CHF; admitted on 03/01/2019, presented with complaint of fall, was found to have left hip fracture. Currently further plan is continue current management.  Subjective: Denies any acute complaint.  Hypoxic on room air to 88%.   Assessment and Plan: 1.  Left femoral neck fracture Unwitnessed fall bili on 02/27/2019. Came to the ER on 02/28/2019 night. X-ray was positive. Orthopedic consulted appreciate assistance. Currently pain controlled with morphine. Awaiting pending work-up for COVID before planning surgery.  2. Preoperative medical evaluation No Coronary revascularization/CVA within 5 years. No Recent stress test. Does not Climb flight of stair, participates in recreational activity,does household chores. No Prior adverse event with anesthesia. No Alcohol use, drug use.  A) Cardiac risk: Based on RCRI  >History of HF  With this the patient is a mild risk for adverse Cardiac outcome from surgery. Recommend no further cardiac work up  Be watchful of hydration since the pt has history of CHF. Monitor Ins and Out.  other recommendations Given the concerning findings of the recent article in patient with COVID who has undergone surgery would recommend to rule out COVID before surgery.   3.  Rule out COVID. Had an episode of fever on 02/27/2019. Currently hypoxic. CT scan does not show typical classic groundglass opacities or patchy ARDS-like picture. No further fever here in the hospital. COVID testing ordered on admission currently pending. Continue droplet contact precaution.  4.  SIRS criteria met on admission, ruled out for sepsis.  Possible H CAP Acute hypoxic respiratory failure. Does not use oxygen at baseline. Currently requires 2 L of oxygen. CT PE protocol negative for pulmonary embolism. Possible atelectasis on initial CT scan on the admission. Small groundglass nodule in the left lower lobe. Started on IV vancomycin and Zosyn on admission.  Blood cultures are negative. We will narrow down the antibiotic further. On azithro and omnicef Monitor cultures for now.  5. Hyponatremia. Sodium 131 now, resolved.  Likely secondary to poor p.o. intake. Will monitor.  6.  Hypokalemia. Replace.  7.  Dementia. No behavioral disturbances. Sundowning. Currently monitoring the patient. Continuing Ativan which the patient has been using at home.  8.  Chronic systolic CHF. Does not appear to be volume moderate for now. Patient actually received IV fluids at the time of admission. Holding Coreg for now. Patient is on 40 mg daily Lasix.  Currently on hold.  9.  Overactive bladder. On Ditropan 5 mg twice daily. Continues to remain having delirium we may have to discontinue this medication.  10. history of rheumatoid arthritis. History of methotrexate use. Currently not on any medication. We will monitor.  Diet: regular diet DVT Prophylaxis: subcutaneous Heparin  Advance goals of care discussion: DNR DNI  Family Communication: no family was present at bedside, at the time of interview.   Disposition:  Discharge to SNF.  Consultants: orthopedics  Procedures: none  Scheduled Meds: . azithromycin  500 mg Oral Daily  . cholecalciferol  2,000 Units Oral Daily  . heparin  5,000 Units Subcutaneous Q8H  . insulin aspart  0-9 Units Subcutaneous TID WC  . magnesium oxide  200 mg Oral Daily  . oxybutynin  5  mg Oral BID  . pantoprazole  40 mg Oral Daily  . pravastatin  40 mg Oral QHS  . primidone  50 mg Oral QHS  . tamsulosin  0.4 mg Oral QPC supper   Continuous Infusions:  PRN Meds: acetaminophen,  LORazepam, morphine injection Antibiotics: Anti-infectives (From admission, onward)   Start     Dose/Rate Route Frequency Ordered Stop   03/02/19 1000  azithromycin (ZITHROMAX) tablet 500 mg     500 mg Oral Daily 03/02/19 0751     03/02/19 0800  cefTRIAXone (ROCEPHIN) 1 g in sodium chloride 0.9 % 100 mL IVPB  Status:  Discontinued     1 g 200 mL/hr over 30 Minutes Intravenous Every 24 hours 03/02/19 0751 03/03/19 0800   03/02/19 0500  vancomycin (VANCOCIN) 1,500 mg in sodium chloride 0.9 % 500 mL IVPB  Status:  Discontinued     1,500 mg 250 mL/hr over 120 Minutes Intravenous Every 24 hours 03/01/19 0416 03/02/19 0750   03/01/19 1200  piperacillin-tazobactam (ZOSYN) IVPB 3.375 g  Status:  Discontinued     3.375 g 12.5 mL/hr over 240 Minutes Intravenous Every 8 hours 03/01/19 0416 03/02/19 0754   03/01/19 0415  piperacillin-tazobactam (ZOSYN) IVPB 3.375 g     3.375 g 100 mL/hr over 30 Minutes Intravenous  Once 03/01/19 0411 03/01/19 0550   03/01/19 0415  vancomycin (VANCOCIN) 1,500 mg in sodium chloride 0.9 % 500 mL IVPB     1,500 mg 250 mL/hr over 120 Minutes Intravenous  Once 03/01/19 0411 03/01/19 0727       Objective: Physical Exam: Vitals:   03/02/19 2000 03/03/19 0400 03/03/19 0618 03/03/19 0820  BP: 109/84  118/60 133/74  Pulse: 95 100 95 96  Resp: 19  15 (!) 21  Temp:   97.9 F (36.6 C) 98.4 F (36.9 C)  TempSrc:   Oral Oral  SpO2: 97% 98% 94% 93%  Weight:      Height:        Intake/Output Summary (Last 24 hours) at 03/03/2019 1557 Last data filed at 03/03/2019 0848 Gross per 24 hour  Intake 358 ml  Output 700 ml  Net -342 ml   Filed Weights   03/01/19 0055  Weight: 79.4 kg   General: Alert, Awake and Oriented to Time, Place and Person. Appear in mild distress, affect appropriate Eyes: PERRL, Conjunctiva normal ENT: Oral Mucosa clear moist Neck: no JVD, no Abnormal Mass Or lumps Cardiovascular: S1 and S2 Present, no Murmur, Peripheral Pulses Present  Respiratory: normal respiratory effort, Bilateral Air entry equal and Decreased, no use of accessory muscle, Clear to Auscultation, no Crackles, no wheezes Abdomen: Bowel Sound present, Soft and no tenderness, no hernia Skin: no redness, no Rash, no induration Extremities: no Pedal edema, no calf tenderness Neurologic: Grossly no focal neuro deficit. Bilaterally Equal motor strength  Data Reviewed: CBC: Recent Labs  Lab 03/01/19 0117 03/01/19 0509 03/02/19 0418 03/03/19 0534  WBC 13.1* 12.3* 9.6 9.4  NEUTROABS 11.6*  --  7.5 7.1  HGB 14.2 14.3 12.7* 12.0*  HCT 43.0 43.6 36.5* 34.7*  MCV 85.3 86.2 83.7 84.6  PLT 144* 128* 141* 025   Basic Metabolic Panel: Recent Labs  Lab 03/01/19 0117 03/01/19 0509 03/02/19 0418 03/03/19 0534  NA 126* 128* 128* 131*  K 4.1 4.1 3.4* 3.4*  CL 95* 91* 94* 97*  CO2 21* _0 GLUCOSE 104* 103* 120* 127*  BUN _1 CREATININE 1.08 1.06 0.99 0.86  CALCIUM  8.5* 8.5* 7.9* 8.1*  MG  --   --  1.7 1.9    Liver Function Tests: Recent Labs  Lab 03/01/19 0117 03/01/19 0509 03/02/19 0418 03/03/19 0534  AST 34 36 17 31  ALT _0 ALKPHOS 64 56 52 56  BILITOT 1.7* 1.9* 1.4* 1.0  PROT 5.9* 6.0* 5.6* 5.2*  ALBUMIN 2.7* 2.6* 2.4* 2.4*   No results for input(s): LIPASE, AMYLASE in the last 168 hours. No results for input(s): AMMONIA in the last 168 hours. Coagulation Profile: Recent Labs  Lab 03/01/19 0619  INR 1.1   Cardiac Enzymes: Recent Labs  Lab 03/01/19 0509 03/01/19 0943 03/01/19 1610  TROPONINI 0.06* 0.05* 0.05*   BNP (last 3 results) No results for input(s): PROBNP in the last 8760 hours. CBG: Recent Labs  Lab 03/01/19 2206 03/02/19 0815 03/02/19 1126 03/02/19 1630 03/03/19 0817  GLUCAP 159* 103* 108* 118* 102*   Studies: No results found.   Time spent: 35 minutes  Author: Berle Mull, MD [facility] 03/03/2019 3:57 PM  To reach On-call, see care teams to locate the attending and  reach out to them via [email or web address]. If 7PM-7AM, please contact night-coverage If you still have difficulty reaching the attending provider, please page the [facility] (Director on Call) for [facility] on amion for assistance.

## 2019-03-03 NOTE — Anesthesia Preprocedure Evaluation (Addendum)
Anesthesia Evaluation  Patient identified by MRN, date of birth, ID band Patient awake  General Assessment Comment:Rheumatoid arthritis.  Affects neck, but has good ROM neck  Reviewed: Allergy & Precautions, H&P , NPO status , Patient's Chart, lab work & pertinent test results, reviewed documented beta blocker date and time   Airway Mallampati: II  TM Distance: >3 FB Neck ROM: Full    Dental  (+) Dental Advisory Given   Pulmonary neg pulmonary ROS, sleep apnea , pneumonia, former smoker,     + decreased breath sounds      Cardiovascular hypertension, Pt. on home beta blockers and Pt. on medications + CAD, + Past MI and +CHF  + dysrhythmias  Rhythm:Regular Rate:Normal  Has had 2 MI. Last in Aug. 2012. No symptoms now.  Had high HR in Aug. Which caused MI. Now controlled with Coreg  Echo 2015 - Left ventricle: The cavity size was normal. Wall thickness wasnormal. The estimated ejection fraction was 45%. Diffusehypokinesis. Doppler parameters are consistent with abnormal leftventricular relaxation (grade 1 diastolic dysfunction). - Aortic valve: There was no stenosis. There was trivial regurgitation. - Mitral valve: Mildly calcified annulus. Mildly calcified leaflets. There was trivial regurgitation. - Right ventricle: The cavity size was normal. Systolic functionwas normal. - Tricuspid valve: There was trivial regurgitation. Peak RV-RAgradient (S): 21 mm Hg. - Pulmonary arteries: PA peak pressure: 24 mm Hg (S). - Inferior vena cava: The vessel was normal in size. The respirophasic diameter changes were in the normal range (>= 50%),consistent with normal central venous pressure.  Impressions: - Technically difficult study with poor acoustic windows. Echocontrast would have been helpful. Normal LV size with probableglobal hypokinesis, EF estimated to be 45%. Normal RV size and systolic function, no significant valvular  abnormalities.   Neuro/Psych negative neurological ROS  negative psych ROS   GI/Hepatic Neg liver ROS, GERD  ,  Endo/Other  negative endocrine ROS  Renal/GU negative Renal ROS     Musculoskeletal negative musculoskeletal ROS (+) Arthritis ,   Abdominal   Peds  Hematology negative hematology ROS (+) anemia ,   Anesthesia Other Findings   Reproductive/Obstetrics                            Anesthesia Physical Anesthesia Plan  ASA: III  Anesthesia Plan: General   Post-op Pain Management:    Induction: Intravenous  PONV Risk Score and Plan: 3 and Ondansetron and Treatment may vary due to age or medical condition  Airway Management Planned: Oral ETT  Additional Equipment:   Intra-op Plan:   Post-operative Plan: Extubation in OR  Informed Consent: I have reviewed the patients History and Physical, chart, labs and discussed the procedure including the risks, benefits and alternatives for the proposed anesthesia with the patient or authorized representative who has indicated his/her understanding and acceptance.     Dental advisory given  Plan Discussed with: CRNA  Anesthesia Plan Comments:         Anesthesia Quick Evaluation

## 2019-03-03 NOTE — Progress Notes (Signed)
Patient ID: Anthony [name], male   DOB: 1934-03-10, 85 y.o.   MRN: [id number] The patient is still medically stable. Hi hip fracture occurred now 4-5 days ago.  His vitals have also remained stable.  He is still awaiting the results of his Covid-19 test.  I have kept his wife and daughters updated daily as to how he is doing.  At this point, we will post him for surgery tomorrow (4/11) for a left hip hemiarthroplasty vs total hip replacement.  The family agrees with this as well.  He is pleasantly demented, but also high functioning.  He follows commands, knows his family, and exhibits left hip pain on attempts at mobility.

## 2019-03-04 ENCOUNTER — Encounter (HOSPITAL_COMMUNITY): Payer: Self-pay

## 2019-03-04 ENCOUNTER — Inpatient Hospital Stay (HOSPITAL_COMMUNITY): Payer: PPO

## 2019-03-04 ENCOUNTER — Inpatient Hospital Stay (HOSPITAL_COMMUNITY): Payer: PPO | Admitting: Certified Registered"

## 2019-03-04 ENCOUNTER — Encounter (HOSPITAL_COMMUNITY)
Admission: EM | Disposition: A | Discharge status: Skilled Nursing Facility | Payer: Self-pay | Source: Home / Self Care | Attending: Internal Medicine

## 2019-03-04 HISTORY — PX: HIP ARTHROPLASTY: SHX981

## 2019-03-04 LAB — COMPREHENSIVE METABOLIC PANEL
ALT: 29 U/L (ref 0–44)
AST: 27 U/L (ref 15–41)
Albumin: 2.4 g/dL — ABNORMAL LOW (ref 3.5–5.0)
Alkaline Phosphatase: 76 U/L (ref 38–126)
Anion gap: 13 (ref 5–15)
BUN: 7 mg/dL — ABNORMAL LOW (ref 8–23)
CO2: 21 mmol/L — ABNORMAL LOW (ref 22–32)
Calcium: 8.3 mg/dL — ABNORMAL LOW (ref 8.9–10.3)
Chloride: 99 mmol/L (ref 98–111)
Creatinine, Ser: 0.79 mg/dL (ref 0.61–1.24)
GFR calc Af Amer: >60 mL/min (ref >60)
GFR calc non Af Amer: >60 mL/min (ref >60)
Glucose, Bld: 135 mg/dL — ABNORMAL HIGH (ref 70–99)
Potassium: 3.5 mmol/L (ref 3.5–5.1)
Sodium: 133 mmol/L — ABNORMAL LOW (ref 135–145)
Total Bilirubin: 1.1 mg/dL (ref 0.3–1.2)
Total Protein: 5.6 g/dL — ABNORMAL LOW (ref 6.5–8.1)

## 2019-03-04 LAB — CBC WITH DIFFERENTIAL/PLATELET
Abs Immature Granulocytes: 0.08 10*3/uL — ABNORMAL HIGH (ref 0.00–0.07)
Basophils Absolute: 0 10*3/uL (ref 0.0–0.1)
Basophils Relative: 0 %
Eosinophils Absolute: 0.3 10*3/uL (ref 0.0–0.5)
Eosinophils Relative: 3 %
HCT: 37.1 % — ABNORMAL LOW (ref 39.0–52.0)
Hemoglobin: 12.4 g/dL — ABNORMAL LOW (ref 13.0–17.0)
Immature Granulocytes: 1 %
Lymphocytes Relative: 8 %
Lymphs Abs: 0.8 10*3/uL (ref 0.7–4.0)
MCH: 28 pg (ref 26.0–34.0)
MCHC: 33.4 g/dL (ref 30.0–36.0)
MCV: 83.7 fL (ref 80.0–100.0)
Monocytes Absolute: 0.8 10*3/uL (ref 0.1–1.0)
Monocytes Relative: 8 %
Neutro Abs: 8.1 10*3/uL — ABNORMAL HIGH (ref 1.7–7.7)
Neutrophils Relative %: 80 %
Platelets: 183 10*3/uL (ref 150–400)
RBC: 4.43 MIL/uL (ref 4.22–5.81)
RDW: 14.6 % (ref 11.5–15.5)
WBC: 10.2 10*3/uL (ref 4.0–10.5)
nRBC: 0 % (ref 0.0–0.2)

## 2019-03-04 LAB — GLUCOSE, CAPILLARY
Glucose-Capillary: 149 mg/dL — ABNORMAL HIGH (ref 70–99)
Glucose-Capillary: 183 mg/dL — ABNORMAL HIGH (ref 70–99)
Glucose-Capillary: 160 mg/dL — ABNORMAL HIGH (ref 70–99)

## 2019-03-04 LAB — MAGNESIUM: Magnesium: 1.9 mg/dL (ref 1.7–2.4)

## 2019-03-04 SURGERY — HEMIARTHROPLASTY, HIP, DIRECT ANTERIOR APPROACH, FOR FRACTURE
Anesthesia: General | Site: Hip | Laterality: Left

## 2019-03-04 MED ORDER — CEFAZOLIN SODIUM-DEXTROSE 2-4 GM/100ML-% IV SOLN
INTRAVENOUS | Status: AC
  Filled 2019-03-04: qty 100

## 2019-03-04 MED ORDER — DEXAMETHASONE SODIUM PHOSPHATE 10 MG/ML IJ SOLN
INTRAMUSCULAR | Status: AC
  Filled 2019-03-04: qty 1

## 2019-03-04 MED ORDER — SODIUM CHLORIDE 0.9 % IR SOLN
Status: DC | PRN
  Administered 2019-03-04: 3000 mL

## 2019-03-04 MED ORDER — SODIUM CHLORIDE 0.9 % IV SOLN
INTRAVENOUS | Status: DC | PRN
  Administered 2019-03-04: 08:00:00 25 ug/min via INTRAVENOUS

## 2019-03-04 MED ORDER — DOCUSATE SODIUM 100 MG PO CAPS
100.0000 mg | ORAL_CAPSULE | Freq: Two times a day (BID) | ORAL | Status: DC
  Administered 2019-03-04 – 2019-03-07 (×6): 100 mg via ORAL
  Filled 2019-03-04 (×6): qty 1

## 2019-03-04 MED ORDER — PROMETHAZINE HCL 25 MG/ML IJ SOLN: 6.2500 mg | INTRAMUSCULAR | Status: DC | PRN

## 2019-03-04 MED ORDER — CEFAZOLIN SODIUM-DEXTROSE 2-3 GM-%(50ML) IV SOLR
INTRAVENOUS | Status: DC | PRN
  Administered 2019-03-04: 2 g via INTRAVENOUS

## 2019-03-04 MED ORDER — CEFAZOLIN SODIUM-DEXTROSE 2-4 GM/100ML-% IV SOLN: 2.0000 g | Freq: Once | INTRAVENOUS | Status: DC

## 2019-03-04 MED ORDER — LACTATED RINGERS IV SOLN
INTRAVENOUS | Status: DC | PRN
  Administered 2019-03-04: 08:00:00 via INTRAVENOUS

## 2019-03-04 MED ORDER — METOCLOPRAMIDE HCL 5 MG/ML IJ SOLN: 5.0000 mg | Freq: Three times a day (TID) | INTRAMUSCULAR | Status: DC | PRN

## 2019-03-04 MED ORDER — DEXAMETHASONE SODIUM PHOSPHATE 10 MG/ML IJ SOLN
INTRAMUSCULAR | Status: DC | PRN
  Administered 2019-03-04: 10 mg via INTRAVENOUS

## 2019-03-04 MED ORDER — FENTANYL CITRATE (PF) 250 MCG/5ML IJ SOLN
INTRAMUSCULAR | Status: AC
  Filled 2019-03-04: qty 5

## 2019-03-04 MED ORDER — ROCURONIUM BROMIDE 50 MG/5ML IV SOSY
PREFILLED_SYRINGE | INTRAVENOUS | Status: DC | PRN
  Administered 2019-03-04: 40 mg via INTRAVENOUS

## 2019-03-04 MED ORDER — MEPERIDINE HCL 50 MG/ML IJ SOLN: 6.2500 mg | INTRAMUSCULAR | Status: DC | PRN

## 2019-03-04 MED ORDER — LIDOCAINE 2% (20 MG/ML) 5 ML SYRINGE
INTRAMUSCULAR | Status: DC | PRN
  Administered 2019-03-04: 100 mg via INTRAVENOUS

## 2019-03-04 MED ORDER — ROCURONIUM BROMIDE 50 MG/5ML IV SOSY
PREFILLED_SYRINGE | INTRAVENOUS | Status: AC
  Filled 2019-03-04: qty 5

## 2019-03-04 MED ORDER — ONDANSETRON HCL 4 MG PO TABS: 4.0000 mg | ORAL_TABLET | Freq: Four times a day (QID) | ORAL | Status: DC | PRN

## 2019-03-04 MED ORDER — 0.9 % SODIUM CHLORIDE (POUR BTL) OPTIME
TOPICAL | Status: DC | PRN
  Administered 2019-03-04: 1000 mL

## 2019-03-04 MED ORDER — ONDANSETRON HCL 4 MG/2ML IJ SOLN
INTRAMUSCULAR | Status: AC
  Filled 2019-03-04: qty 2

## 2019-03-04 MED ORDER — MENTHOL 3 MG MT LOZG: 1.0000 | LOZENGE | OROMUCOSAL | Status: DC | PRN

## 2019-03-04 MED ORDER — HYDROCODONE-ACETAMINOPHEN 7.5-325 MG PO TABS
1.0000 | ORAL_TABLET | ORAL | Status: DC | PRN
  Administered 2019-03-06 – 2019-03-07 (×2): 2 via ORAL
  Filled 2019-03-04 (×2): qty 2

## 2019-03-04 MED ORDER — ACETAMINOPHEN 325 MG PO TABS: 325.0000 mg | ORAL_TABLET | Freq: Four times a day (QID) | ORAL | Status: DC | PRN

## 2019-03-04 MED ORDER — PHENYLEPHRINE HCL (PRESSORS) 10 MG/ML IV SOLN
INTRAVENOUS | Status: DC | PRN
  Administered 2019-03-04: 80 ug via INTRAVENOUS

## 2019-03-04 MED ORDER — ONDANSETRON HCL 4 MG/2ML IJ SOLN: 4.0000 mg | Freq: Four times a day (QID) | INTRAMUSCULAR | Status: DC | PRN

## 2019-03-04 MED ORDER — FENTANYL CITRATE (PF) 250 MCG/5ML IJ SOLN
INTRAMUSCULAR | Status: DC | PRN
  Administered 2019-03-04: 25 ug via INTRAVENOUS
  Administered 2019-03-04 (×3): 50 ug via INTRAVENOUS

## 2019-03-04 MED ORDER — PHENOL 1.4 % MT LIQD: 1.0000 | OROMUCOSAL | Status: DC | PRN

## 2019-03-04 MED ORDER — METOCLOPRAMIDE HCL 5 MG PO TABS: 5.0000 mg | ORAL_TABLET | Freq: Three times a day (TID) | ORAL | Status: DC | PRN

## 2019-03-04 MED ORDER — ASPIRIN 81 MG PO CHEW
81.0000 mg | CHEWABLE_TABLET | Freq: Two times a day (BID) | ORAL | Status: DC
  Administered 2019-03-05 – 2019-03-08 (×7): 81 mg via ORAL
  Filled 2019-03-04 (×7): qty 1

## 2019-03-04 MED ORDER — PROPOFOL 10 MG/ML IV BOLUS
INTRAVENOUS | Status: AC
  Filled 2019-03-04: qty 20

## 2019-03-04 MED ORDER — SUCCINYLCHOLINE CHLORIDE 200 MG/10ML IV SOSY
PREFILLED_SYRINGE | INTRAVENOUS | Status: AC
  Filled 2019-03-04: qty 10

## 2019-03-04 MED ORDER — PROPOFOL 10 MG/ML IV BOLUS
INTRAVENOUS | Status: DC | PRN
  Administered 2019-03-04: 120 mg via INTRAVENOUS

## 2019-03-04 MED ORDER — SUGAMMADEX SODIUM 200 MG/2ML IV SOLN
INTRAVENOUS | Status: DC | PRN
  Administered 2019-03-04: 200 mg via INTRAVENOUS

## 2019-03-04 MED ORDER — HYDROMORPHONE HCL 1 MG/ML IJ SOLN: 0.2500 mg | INTRAMUSCULAR | Status: DC | PRN

## 2019-03-04 MED ORDER — HYDROCODONE-ACETAMINOPHEN 5-325 MG PO TABS
1.0000 | ORAL_TABLET | ORAL | Status: DC | PRN
  Administered 2019-03-04 – 2019-03-05 (×3): 1 via ORAL
  Administered 2019-03-05 – 2019-03-06 (×2): 2 via ORAL
  Filled 2019-03-04 (×2): qty 1
  Filled 2019-03-04: qty 2
  Filled 2019-03-04: qty 1
  Filled 2019-03-04: qty 2

## 2019-03-04 MED ORDER — MORPHINE SULFATE (PF) 2 MG/ML IV SOLN: 0.5000 mg | INTRAVENOUS | Status: DC | PRN

## 2019-03-04 MED ORDER — ONDANSETRON HCL 4 MG/2ML IJ SOLN
INTRAMUSCULAR | Status: DC | PRN
  Administered 2019-03-04: 4 mg via INTRAVENOUS

## 2019-03-04 MED ORDER — LIDOCAINE 2% (20 MG/ML) 5 ML SYRINGE
INTRAMUSCULAR | Status: AC
  Filled 2019-03-04: qty 5

## 2019-03-04 SURGICAL SUPPLY — 49 items
BALL HIP ARTICU 28 +5 (Hips) ×1 IMPLANT
BIPOLAR AML DEPUY 53 (Hips) ×3 IMPLANT
BIPOLAR AML DEPUY 53MM (Hips) ×1 IMPLANT
BLADE SAW SGTL 18X1.27X75 (BLADE) ×3 IMPLANT
BLADE SAW SGTL 18X1.27X75MM (BLADE) ×1
COVER SURGICAL LIGHT HANDLE (MISCELLANEOUS) ×4 IMPLANT
DRAPE C-ARM 42X72 X-RAY (DRAPES) ×4 IMPLANT
DRAPE STERI IOBAN 125X83 (DRAPES) ×4 IMPLANT
DRAPE U-SHAPE 47X51 STRL (DRAPES) ×12 IMPLANT
DRSG AQUACEL AG ADV 3.5X10 (GAUZE/BANDAGES/DRESSINGS) ×4 IMPLANT
ELECT BLADE 4.0 EZ CLEAN MEGAD (MISCELLANEOUS) ×4
ELECT REM PT RETURN 9FT ADLT (ELECTROSURGICAL) ×4
ELECTRODE BLDE 4.0 EZ CLN MEGD (MISCELLANEOUS) ×2 IMPLANT
ELECTRODE REM PT RTRN 9FT ADLT (ELECTROSURGICAL) ×2 IMPLANT
FACESHIELD WRAPAROUND (MASK) ×8 IMPLANT
FACESHIELD WRAPAROUND OR TEAM (MASK) ×2 IMPLANT
GAUZE XEROFORM 5X9 LF (GAUZE/BANDAGES/DRESSINGS) ×3 IMPLANT
GLOVE BIOGEL PI IND STRL 7.0 (GLOVE) ×1 IMPLANT
GLOVE BIOGEL PI IND STRL 8 (GLOVE) ×4 IMPLANT
GLOVE BIOGEL PI INDICATOR 7.0 (GLOVE) ×2
GLOVE BIOGEL PI INDICATOR 8 (GLOVE) ×4
GLOVE ECLIPSE 8.0 STRL XLNG CF (GLOVE) ×4 IMPLANT
GLOVE ORTHO TXT STRL SZ7.5 (GLOVE) ×8 IMPLANT
GOWN STRL REUS W/ TWL LRG LVL3 (GOWN DISPOSABLE) ×3 IMPLANT
GOWN STRL REUS W/ TWL XL LVL3 (GOWN DISPOSABLE) ×4 IMPLANT
GOWN STRL REUS W/TWL LRG LVL3 (GOWN DISPOSABLE) ×4
GOWN STRL REUS W/TWL XL LVL3 (GOWN DISPOSABLE) ×8
HANDPIECE INTERPULSE COAX TIP (DISPOSABLE) ×4
HEAD BIPOLAR AML DEPUY 53 (Hips) ×1 IMPLANT
HIP BALL ARTICU 28 +5 (Hips) ×4 IMPLANT
KIT BASIN OR (CUSTOM PROCEDURE TRAY) ×4 IMPLANT
KIT TURNOVER KIT B (KITS) ×4 IMPLANT
MANIFOLD NEPTUNE II (INSTRUMENTS) ×4 IMPLANT
NS IRRIG 1000ML POUR BTL (IV SOLUTION) ×4 IMPLANT
PACK TOTAL JOINT (CUSTOM PROCEDURE TRAY) ×4 IMPLANT
PAD ARMBOARD 7.5X6 YLW CONV (MISCELLANEOUS) ×4 IMPLANT
SET HNDPC FAN SPRY TIP SCT (DISPOSABLE) ×2 IMPLANT
STAPLER VISISTAT 35W (STAPLE) ×3 IMPLANT
STEM FEM ACTIS HIGH SZ8 (Stem) ×3 IMPLANT
SUT ETHIBOND NAB CT1 #1 30IN (SUTURE) ×4 IMPLANT
SUT VIC AB 0 CT1 27 (SUTURE) ×4
SUT VIC AB 0 CT1 27XBRD ANBCTR (SUTURE) ×2 IMPLANT
SUT VIC AB 1 CT1 27 (SUTURE) ×4
SUT VIC AB 1 CT1 27XBRD ANBCTR (SUTURE) ×2 IMPLANT
SUT VIC AB 2-0 CT1 27 (SUTURE) ×8
SUT VIC AB 2-0 CT1 TAPERPNT 27 (SUTURE) ×3 IMPLANT
TOWEL OR 17X24 6PK STRL BLUE (TOWEL DISPOSABLE) ×4 IMPLANT
TOWEL OR 17X26 10 PK STRL BLUE (TOWEL DISPOSABLE) ×4 IMPLANT
WATER STERILE IRR 1000ML POUR (IV SOLUTION) ×5 IMPLANT

## 2019-03-04 NOTE — Progress Notes (Signed)
Back to room from OR. Drowsy, but arouses to verbal stimuli. Denies pain. Ice pack in place to Left Hip. VSS. Bed Alarm set. Call bell in reach.

## 2019-03-04 NOTE — Progress Notes (Signed)
[facility] Progress Note  Patient: Anthony [name] [id number]:[id number]   PCP: No primary care provider on file. DOB: 1934/02/21   DOA: 03/01/2019   DOS: 03/04/2019   Date of Service: the patient was seen and examined on 03/04/2019  Brief hospital course: Pt. with PMH of rheumatoid arthritis, CAD, Crohn's disease, IDDM, HTN, HLD, chronic systolic CHF; admitted on 03/01/2019, presented with complaint of fall, was found to have left hip fracture. Currently further plan is continue current management.  Subjective: Patient seen after the surgery. Sleepy after receiving anesthesia. No acute events.  Tolerated procedure very well.  Assessment and Plan: 1.  Left femoral neck fracture Unwitnessed fall on 02/27/2019. S/P left hip bipolar hemiarthroplasty on 03/04/2019. Came to the ER on 02/28/2019 night. X-ray was positive. Orthopedic consulted appreciate assistance. Currently pain controlled with morphine. Monitor postoperative course.  2. Preoperative medical evaluation No Coronary revascularization/CVA within 5 years. No Recent stress test. Does not Climb flight of stair, participates in recreational activity,does household chores. No Prior adverse event with anesthesia. No Alcohol use, drug use.  A) Cardiac risk: Based on RCRI  >History of HF  With this the patient is a mild risk for adverse Cardiac outcome from surgery. Recommend no further cardiac work up  Be watchful of hydration since the pt has history of CHF. Monitor Ins and Out.  other recommendations Given the concerning findings of the recent article in patient with COVID who has undergone surgery would recommend to rule out COVID before surgery.   3.  COVID PCR negative Had an episode of fever on 02/27/2019. Initially hypoxic. CT scan does not show typical classic groundglass opacities are patchy ARDS-like picture. No further fever here in the hospital. COVID testing ordered on admission currently negative. Discontinue  droplet contact precaution.  Low pretest probability.  4.  SIRS criteria met on admission, ruled out for sepsis. Possible H CAP Acute hypoxic respiratory failure. Does not use oxygen at baseline. Postoperatively currently requires 2 L of oxygen. CT PE protocol negative for pulmonary embolism. Possible atelectasis on initial CT scan on the admission. Small groundglass nodule in the left lower lobe. Started on IV vancomycin and Zosyn on admission.  Blood cultures are negative. We will narrow down the antibiotic further. Monitor cultures for now.  5. Hyponatremia. Sodium 128.  Resolved Likely secondary to poor p.o. intake. Will monitor.  6.  Hypokalemia. Replace.  7.  Dementia. No behavioral disturbances. Sundowning. Currently monitoring the patient. Continuing Ativan which the patient has been using at home.  8.  Chronic systolic CHF. Does not appear to be volume moderate for now. Patient actually received IV fluids at the time of admission. Holding Coreg for now. Patient is on 40 mg daily Lasix.  Currently on hold.  Resume tomorrow  9.  Overactive bladder. On Ditropan 5 mg twice daily. Continues to remain having delirium we may have to discontinue this medication.  10 history of rheumatoid arthritis. History of methotrexate use. Currently not on any medication. We will monitor.  Diet: regular diet DVT Prophylaxis: subcutaneous Heparin  Advance goals of care discussion: DNR DNI  Family Communication: no family was present at bedside, at the time of interview. The pt provided permission to discuss medical plan with the family. D/w daughter on the phone, Opportunity was given to ask question and all questions were answered satisfactorily.   Disposition:  Discharge to SNF.  Consultants: orthopedics  Procedures: none  Scheduled Meds: . aspirin  81 mg Oral BID PC  .  azithromycin  500 mg Oral Daily  . cefdinir  300 mg Oral Q12H  . cholecalciferol  2,000 Units Oral  Daily  . docusate sodium  100 mg Oral BID  . insulin aspart  0-9 Units Subcutaneous TID WC  . magnesium oxide  200 mg Oral Q supper  . oxybutynin  5 mg Oral BID  . pantoprazole  40 mg Oral Daily  . pravastatin  40 mg Oral QHS  . primidone  50 mg Oral QHS  . tamsulosin  0.4 mg Oral QPC supper   Continuous Infusions: . ceFAZolin     PRN Meds: [START ON 03/05/2019] acetaminophen, acetaminophen, HYDROcodone-acetaminophen, HYDROcodone-acetaminophen, LORazepam, menthol-cetylpyridinium **OR** phenol, metoCLOPramide **OR** metoCLOPramide (REGLAN) injection, morphine injection, ondansetron **OR** ondansetron (ZOFRAN) IV Antibiotics: Anti-infectives (From admission, onward)   Start     Dose/Rate Route Frequency Ordered Stop   03/04/19 0730  ceFAZolin (ANCEF) IVPB 2g/100 mL premix  Status:  Discontinued     2 g 200 mL/hr over 30 Minutes Intravenous  Once 03/04/19 0726 03/04/19 1129   03/04/19 0727  ceFAZolin (ANCEF) 2-4 GM/100ML-% IVPB    Note to Pharmacy:  [name]   : cabinet override      03/04/19 0727 03/04/19 1929   03/03/19 2200  cefdinir (OMNICEF) capsule 300 mg     300 mg Oral Every 12 hours 03/03/19 1600     03/02/19 1000  azithromycin (ZITHROMAX) tablet 500 mg     500 mg Oral Daily 03/02/19 0751     03/02/19 0800  cefTRIAXone (ROCEPHIN) 1 g in sodium chloride 0.9 % 100 mL IVPB  Status:  Discontinued     1 g 200 mL/hr over 30 Minutes Intravenous Every 24 hours 03/02/19 0751 03/03/19 0800   03/02/19 0500  vancomycin (VANCOCIN) 1,500 mg in sodium chloride 0.9 % 500 mL IVPB  Status:  Discontinued     1,500 mg 250 mL/hr over 120 Minutes Intravenous Every 24 hours 03/01/19 0416 03/02/19 0750   03/01/19 1200  piperacillin-tazobactam (ZOSYN) IVPB 3.375 g  Status:  Discontinued     3.375 g 12.5 mL/hr over 240 Minutes Intravenous Every 8 hours 03/01/19 0416 03/02/19 0754   03/01/19 0415  piperacillin-tazobactam (ZOSYN) IVPB 3.375 g     3.375 g 100 mL/hr over 30 Minutes Intravenous  Once  03/01/19 0411 03/01/19 0550   03/01/19 0415  vancomycin (VANCOCIN) 1,500 mg in sodium chloride 0.9 % 500 mL IVPB     1,500 mg 250 mL/hr over 120 Minutes Intravenous  Once 03/01/19 0411 03/01/19 0727       Objective: Physical Exam: Vitals:   03/04/19 1336 03/04/19 1338 03/04/19 1526 03/04/19 1730  BP:   122/76 123/75  Pulse: (!) 110 (!) 111 (!) 108 (!) 101  Resp: 18 18 15 20   Temp:   97.8 F (36.6 C) 98.2 F (36.8 C)  TempSrc:   Oral Oral  SpO2: (!) 85% 92% 93% 97%  Weight:      Height:        Intake/Output Summary (Last 24 hours) at 03/04/2019 1913 Last data filed at 03/04/2019 1527 Gross per 24 hour  Intake 750 ml  Output 2600 ml  Net -1850 ml   Filed Weights   03/01/19 0055 03/04/19 0434  Weight: 79.4 kg 79.5 kg   General: Alert, Awake and Oriented to Time, Place and Person. Appear in mild distress, affect appropriate Eyes: PERRL, Conjunctiva normal ENT: Oral Mucosa clear moist Neck: no JVD, no Abnormal Mass Or lumps Cardiovascular: S1 and  S2 Present, no Murmur, Peripheral Pulses Present Respiratory: normal respiratory effort, Bilateral Air entry equal and Decreased, no use of accessory muscle, Clear to Auscultation, no Crackles, no wheezes Abdomen: Bowel Sound present, Soft and no tenderness, no hernia Skin: no redness, no Rash, no induration Extremities: no Pedal edema, no calf tenderness Neurologic: Grossly no focal neuro deficit. Bilaterally Equal motor strength  Data Reviewed: CBC: Recent Labs  Lab 03/01/19 0117 03/01/19 0509 03/02/19 0418 03/03/19 0534 03/04/19 0252  WBC 13.1* 12.3* 9.6 9.4 10.2  NEUTROABS 11.6*  --  7.5 7.1 8.1*  HGB 14.2 14.3 12.7* 12.0* 12.4*  HCT 43.0 43.6 36.5* 34.7* 37.1*  MCV 85.3 86.2 83.7 84.6 83.7  PLT 144* 128* 141* 158 885   Basic Metabolic Panel: Recent Labs  Lab 03/01/19 0117 03/01/19 0509 03/02/19 0418 03/03/19 0534 03/04/19 0252  NA 126* 128* 128* 131* 133*  K 4.1 4.1 3.4* 3.4* 3.5  CL 95* 91* 94* 97* 99   CO2 21* 25 22 23  21*  GLUCOSE 104* 103* 120* 127* 135*  BUN 15 16 15 10  7*  CREATININE 1.08 1.06 0.99 0.86 0.79  CALCIUM 8.5* 8.5* 7.9* 8.1* 8.3*  MG  --   --  1.7 1.9 1.9    Liver Function Tests: Recent Labs  Lab 03/01/19 0117 03/01/19 0509 03/02/19 0418 03/03/19 0534 03/04/19 0252  AST 34 36 17 31 27   ALT 16 18 15 23 29   ALKPHOS 64 56 52 56 76  BILITOT 1.7* 1.9* 1.4* 1.0 1.1  PROT 5.9* 6.0* 5.6* 5.2* 5.6*  ALBUMIN 2.7* 2.6* 2.4* 2.4* 2.4*   No results for input(s): LIPASE, AMYLASE in the last 168 hours. No results for input(s): AMMONIA in the last 168 hours. Coagulation Profile: Recent Labs  Lab 03/01/19 0619  INR 1.1   Cardiac Enzymes: Recent Labs  Lab 03/01/19 0509 03/01/19 0943 03/01/19 1610  TROPONINI 0.06* 0.05* 0.05*   BNP (last 3 results) No results for input(s): PROBNP in the last 8760 hours. CBG: Recent Labs  Lab 03/03/19 1219 03/03/19 1608 03/03/19 2112 03/04/19 0951 03/04/19 1729  GLUCAP 107* 126* 143* 149* 183*   Studies: Dg C-arm 1-60 Min  Result Date: 03/04/2019 CLINICAL DATA:  Hip fracture. EXAM: DG C-ARM 61-120 MIN; OPERATIVE LEFT HIP WITH PELVIS COMPARISON:  03/01/2019 FINDINGS: Left hip hemiarthroplasty for fracture. Normal alignment. No acute complication. IMPRESSION: Satisfactory left hip hemiarthroplasty for fracture. Electronically Signed   By: Franchot Gallo M.D.   On: 03/04/2019 11:13   Dg Hip Operative Unilat W Or W/o Pelvis Left  Result Date: 03/04/2019 CLINICAL DATA:  Hip fracture. EXAM: DG C-ARM 61-120 MIN; OPERATIVE LEFT HIP WITH PELVIS COMPARISON:  03/01/2019 FINDINGS: Left hip hemiarthroplasty for fracture. Normal alignment. No acute complication. IMPRESSION: Satisfactory left hip hemiarthroplasty for fracture. Electronically Signed   By: Franchot Gallo M.D.   On: 03/04/2019 11:13     Time spent: 35 minutes  Author: Berle Mull, MD [facility] 03/04/2019 7:13 PM  To reach On-call, see care teams to locate the  attending and reach out to them via [email or web address]. If 7PM-7AM, please contact night-coverage If you still have difficulty reaching the attending provider, please page the [facility] (Director on Call) for [facility] on amion for assistance.

## 2019-03-04 NOTE — Op Note (Signed)
NAME: Anthony [name], Anthony [name] MEDICAL RECORD [id number] ACCOUNT [id number] DATE OF BIRTH:[birth year] FACILITY: [facility] LOCATION: [facility] PHYSICIAN:Previn Jian Kerry Fort, MD  OPERATIVE REPORT  DATE OF PROCEDURE:  03/04/2019  PREOPERATIVE DIAGNOSIS:  Left hip displaced femoral neck fracture.  POSTOPERATIVE DIAGNOSIS:  Left hip displaced femoral neck fracture.  PROCEDURE:  Left hip bipolar hemiarthroplasty through direct anterior approach.  IMPLANTS:  DePuy size 53, unipolar metal femoral head with a 28+5 bipolar ball, size 8 high offset ACTIS Actis femoral component.  SURGEON:  Lind Guest. Ninfa Linden, MD  ASSISTANT:  Erskine Emery, PA-C  ANESTHESIA:  General.  ANTIBIOTICS:  Two grams IV Ancef.  ESTIMATED BLOOD LOSS:  150 mL.  COMPLICATIONS:  None.  INDICATIONS:  The patient is an 85 year old gentleman who is pleasantly demented.  He is aware of his surroundings and has good family support.  He does stay in a skilled nursing facility in sometime this past Monday sustained a mechanical fall in the  facility.  X-rays were obtained today.  Later he was found to have a displaced femoral neck fracture.  He was sent to the [facility] emergency room.  Apparently, he had a temperature of 100.7 and low sats.  He was placed on droplet precautions while a COVID-19 swab  was sent.  He was admitted to the hospitalist service graciously.  He never showed signs of any respiratory compromise at all.  The test finally came back as negative and I was presented for definitive presenting for definitive fixation of his left hip  with a hemiarthroplasty.  I did talk to his daughter who is healthcare power of attorney and had a long and thorough discussion about the risks and benefits of this surgery.  They understand the risk of acute blood loss anemia, nerve or vessel injury,  fracture, infection, DVT, and implant failure as well as dislocation.  They understand our goals are to decrease pain, improve  mobility and really overall improving his quality of life.  He is having considerable amount of left hip pain and does  recognize his left hip is deformed.  Unfortunately, without delay of surgery, he is starting to developing skin breakdown on his sacral area with a decubitus, early stage.  DESCRIPTION OF PROCEDURE:  After informed consent was obtained and appropriate left hip was marked, he was brought to the operating room where general anesthesia was obtained while he was on a stretcher.  He was then placed supine on the Hana fracture  table with a perineal post in place and both legs in line skeletal traction device and no traction applied.  His left operative hip was prepped and draped with DuraPrep and sterile drapes.  Time-out was called and he was identified as the correct  patient, correct left hip.  We then made an incision just inferior and posterior to the anterior iliac spine and carried this obliquely down the leg.  We dissected down tensor fascia lata muscle.  Tensor fascia was then divided longitudinally to proceed  with direct anterior approach to the hip.  We identified and cauterized circumflex vessels.  I then identified the hip capsule, opened up the hip capsule finding scarring of the hip capsule,  an obvious hematoma intracapsular from his femoral neck  fracture that was obviously seen and nondisplaced fracture.  We then made a freshening cut with an oscillating saw just above the lesser trochanter, but below the femoral neck fracture.  We removed the femoral head in its entirety and took measurements  off of that and  chose a size 53 head for our unipolar component of the bipolar hip.  We removed debris from the acetabulum and then brought the leg down and under to assess the femur.  We then used initiating reamer opened up the femoral canal and began  broaching from the starter broach, then from a zero all the way to a size 8 in 1 mm increments with a size 8 in place.  We trialed a  bipolar size 53 femoral head with a 28+1.5 small ball spacer.  We rolled the leg back over in upward traction, internal  rotation, reducing the pelvis.  This was a standard neck as well.  Assessing him radiographically and clinically, we felt like we needed a little bit more leg length and offset.  We removed the trial components and then placed the real ACTIS femoral  component, size 8 but with high offset went with a 53 and a 28+5 metal bipolar component.  Once we put this on, we reduced the acetabulum.  We were pleased with the leg length, offset, range of motion and stability assessed clinically and  radiographically.  We then irrigated the soft tissue with normal saline solution using pulsatile lavage.  We closed some remnants of the joint capsule with #1 Ethibond suture, followed by running 0-Vicryl in the tensor fascia, 0 Vicryl was used to close  deep tissue, 2-0 Vicryl was used to close subcutaneous tissue and staples were used to close the skin.    He was then taken off of the operating table, awakened, extubated, and taken to recovery room in stable condition.    All final counts were correct.    There were no complications noted.    Of note, Benita Stabile, PA-C, assisted the entire case.  His assistance was crucial for facilitating all aspects of this case.  AN/NUANCE  D:03/04/2019 T:03/04/2019 JOB:[id number]

## 2019-03-04 NOTE — Brief Op Note (Signed)
03/04/2019  9:28 AM  PATIENT:  [name]  85 y.o. male  PRE-OPERATIVE DIAGNOSIS:  LEFT FEMORAL NECK FRACTURE  POST-OPERATIVE DIAGNOSIS:  LEFT FEMORAL NECK FRACTURE  PROCEDURE:  Procedure(s): LEFT HIP HEMIARTHROPLASTY (Left)  SURGEON:  Surgeon(s) and Role:    Mcarthur Rossetti, MD - Primary  PHYSICIAN ASSISTANT: Benita Stabile, PA-C  ANESTHESIA:   general  EBL:  200 mL   COUNTS:  YES  DICTATION: .Other Dictation: Dictation Number [id number]  PLAN OF CARE: Admit to inpatient   PATIENT DISPOSITION:  PACU - hemodynamically stable.   Delay start of Pharmacological VTE agent (>24hrs) due to surgical blood loss or risk of bleeding: no

## 2019-03-04 NOTE — Progress Notes (Signed)
Patient ID: Anthony [name], male   DOB: 1933/12/05, 86 y.o.   MRN: [id number] The plan is to proceed to surgery today for a left hip hemiarthroplasty vs a total hip.  The risks and benefits have been discussed in detail with his daugher who is POA and informed consent is obtained.  The left hip has been marked.  He is medically stable.

## 2019-03-04 NOTE — Transfer of Care (Signed)
Immediate Anesthesia Transfer of Care Note  Patient: Anthony [name]  Procedure(s) Performed: LEFT HIP HEMIARTHROPLASTY (Left Hip)  Patient Location: PACU  Anesthesia Type:General  Level of Consciousness: awake, alert , oriented and patient cooperative  Airway & Oxygen Therapy: Patient Spontanous Breathing and Patient connected to face mask oxygen  Post-op Assessment: Report given to RN and Post -op Vital signs reviewed and stable  Post vital signs: Reviewed and stable  Last Vitals:  Vitals Value Taken Time  BP 142/78 03/04/2019  9:50 AM  Temp    Pulse 104 03/04/2019  9:50 AM  Resp 28 03/04/2019  9:50 AM  SpO2 97 % 03/04/2019  9:50 AM  Vitals shown include unvalidated device data.  Last Pain:  Vitals:   03/04/19 0434  TempSrc: Oral  PainSc:       Patients Stated Pain Goal: 0 (84/83/50 7573)  Complications: No apparent anesthesia complications

## 2019-03-04 NOTE — Progress Notes (Signed)
Report called to Gae Bon, RN for transfer to 6N Rm #6. Pt daughter notified of new room assignment.

## 2019-03-04 NOTE — Anesthesia Postprocedure Evaluation (Signed)
Anesthesia Post Note  Patient: Anthony [name]  Procedure(s) Performed: LEFT HIP HEMIARTHROPLASTY (Left Hip)     Patient location during evaluation: PACU Anesthesia Type: General Level of consciousness: sedated and patient cooperative Pain management: pain level controlled Vital Signs Assessment: post-procedure vital signs reviewed and stable Respiratory status: spontaneous breathing Cardiovascular status: stable Anesthetic complications: no    Last Vitals:  Vitals:   03/04/19 1338 03/04/19 1526  BP:  122/76  Pulse: (!) 111 (!) 108  Resp: 18 15  Temp:  36.6 C  SpO2: 92% 93%    Last Pain:  Vitals:   03/04/19 1526  TempSrc: Oral  PainSc:                  Nolon Nations

## 2019-03-04 NOTE — Progress Notes (Signed)
Remains sleepy but arousable. On room air sats drop occ to upper 70's. Periods of sleep apnea noted. Placed on 2L while pt asleep. Will cont to monitor.

## 2019-03-05 DIAGNOSIS — L8990 Pressure ulcer of unspecified site, unspecified stage: Secondary | ICD-10-CM

## 2019-03-05 LAB — GLUCOSE, CAPILLARY
Glucose-Capillary: 144 mg/dL — ABNORMAL HIGH (ref 70–99)
Glucose-Capillary: 169 mg/dL — ABNORMAL HIGH (ref 70–99)
Glucose-Capillary: 153 mg/dL — ABNORMAL HIGH (ref 70–99)
Glucose-Capillary: 116 mg/dL — ABNORMAL HIGH (ref 70–99)

## 2019-03-05 LAB — BASIC METABOLIC PANEL WITH GFR
Anion gap: 14 (ref 5–15)
BUN: 16 mg/dL (ref 8–23)
CO2: 20 mmol/L — ABNORMAL LOW (ref 22–32)
Calcium: 8.4 mg/dL — ABNORMAL LOW (ref 8.9–10.3)
Chloride: 97 mmol/L — ABNORMAL LOW (ref 98–111)
Creatinine, Ser: 1.08 mg/dL (ref 0.61–1.24)
GFR calc Af Amer: >60 mL/min
GFR calc non Af Amer: >60 mL/min
Glucose, Bld: 172 mg/dL — ABNORMAL HIGH (ref 70–99)
Potassium: 4.2 mmol/L (ref 3.5–5.1)
Sodium: 131 mmol/L — ABNORMAL LOW (ref 135–145)

## 2019-03-05 LAB — CBC
HCT: 34.6 % — ABNORMAL LOW (ref 39.0–52.0)
Hemoglobin: 11.6 g/dL — ABNORMAL LOW (ref 13.0–17.0)
MCH: 28.6 pg (ref 26.0–34.0)
MCHC: 33.5 g/dL (ref 30.0–36.0)
MCV: 85.4 fL (ref 80.0–100.0)
Platelets: 255 10*3/uL (ref 150–400)
RBC: 4.05 MIL/uL — ABNORMAL LOW (ref 4.22–5.81)
RDW: 14.6 % (ref 11.5–15.5)
WBC: 18.7 10*3/uL — ABNORMAL HIGH (ref 4.0–10.5)
nRBC: 0 % (ref 0.0–0.2)

## 2019-03-05 MED ORDER — CARVEDILOL 3.125 MG PO TABS
3.1250 mg | ORAL_TABLET | Freq: Two times a day (BID) | ORAL | Status: DC
  Administered 2019-03-05: 17:00:00 3.125 mg via ORAL
  Filled 2019-03-05 (×2): qty 1

## 2019-03-05 NOTE — Progress Notes (Signed)
PROGRESS NOTE    Anthony [name]  NGE:[id number] DOB: August 17, 1934 DOA: 03/01/2019 PCP: No primary care provider on file.    Brief Narrative; 85 year old with past medical history significant for rheumatoid arthritis, coronary artery disease, Crohn's disease, IDDM, hypertension chronic systolic heart failure admitted on 03/01/2019 presented with complaint of fall was found to have left hip fracture.  Patient had an episode of fever on 4/6, he was noted to be hypoxic as well.  CT scan does not show typical classic groundglass opacity like ARDS picture.  He was tested for COVID-19 which was negative.  He was a low pretest probability. He underwent left hip bipolar hemiarthroplasty on 4-11.   Assessment & Plan:   Principal Problem:   Sepsis ([place]) Active Problems:   Suspected Covid-19 Virus Infection   Acute respiratory failure with hypoxia (HCC)   Hip fracture ([place])   Fall   Displaced fracture of base of neck of left femur, initial encounter for closed fracture (HCC)   Pressure injury of skin   1-left femoral neck fracture: Unwitnessed fall on 02/27/2019. Status post left hip bipolar hemiarthroplasty on 4/11 2020. PT evaluation today. DVT prophylaxis per Ortho.  2-COVID with PCR negative Patient had an episode of fever, hypoxemia, CT chest did not show evidence of ARDS. Droplet contact precaution was discontinued.  3-associated pneumonia: Acute hypoxic respiratory failure.  Patient with Sirs criteria. CT PE protocol was negative for PE.  Possible atelectasis on initial CT scan.  Small groundglass nodule in the left lower lobe. Patient was a started on IV vancomycin, Zosyn.  Now on cefdinir and azithromycin.  4-Hyponatremia; initially sodium at 128.  Proved  5-Dementia: No behavioral disturbance. He is more alert today.  6-Chronic systolic heart failure. Continue to hold Lasix  Overreactive bladder; continue with Ditropan rheumatoid arthritis: Not currently on medication for this.   Leukocytosis: His respiratory symptom has improved. He is on oral antibiotics.  I will repeat white count tomorrow.  Acute blood loss anemia mild: Post surgery.  Monitor hemoglobin.  Pressure injury stage II buttocks Local care  Pressure Injury 03/04/19 Stage II -  Partial thickness loss of dermis presenting as a shallow open ulcer with a red, pink wound bed without slough. (Active)  03/04/19 2200  Location: Buttocks  Location Orientation: Right;Left  Staging: Stage II -  Partial thickness loss of dermis presenting as a shallow open ulcer with a red, pink wound bed without slough.  Wound Description (Comments):   Present on Admission:      Estimated body mass index is 25.15 kg/m as calculated from the following:   Height as of this encounter: 5' 10"  (1.778 m).   Weight as of this encounter: 79.5 kg.   DVT prophylaxis: Aspirin Code Status: DNR Family Communication: Well call  daughter Disposition Plan: Hopefully skilled nursing facility tomorrow  Consultants:   ortho   Procedures: Status post left hip bipolar hemiarthroplasty on 4/11 2020.   Antimicrobials:   cefdinir   Subjective: He is alert oriented reports pain is controlled.  Denies shortness of breath.  Objective: Vitals:   03/04/19 1730 03/04/19 2008 03/05/19 0007 03/05/19 0432  BP: 123/75 136/83 (!) 107/55 115/73  Pulse: (!) 101 99 (!) 104 (!) 101  Resp: 20 14 18 16   Temp: 98.2 F (36.8 C) 97.9 F (36.6 C) (!) 97.5 F (36.4 C) 97.8 F (36.6 C)  TempSrc: Oral Oral Oral Oral  SpO2: 97% 96% 97% 95%  Weight:      Height:  Intake/Output Summary (Last 24 hours) at 03/05/2019 1231 Last data filed at 03/05/2019 0500 Gross per 24 hour  Intake -  Output 1000 ml  Net -1000 ml   Filed Weights   03/01/19 0055 03/04/19 0434  Weight: 79.4 kg 79.5 kg    Examination:  General exam: Appears calm and comfortable  Respiratory system: Clear to auscultation. Respiratory effort normal.  Cardiovascular system: S1 & S2 heard, RRR. No JVD, murmurs, rubs, gallops or clicks. No pedal edema. Gastrointestinal system: Abdomen is nondistended, soft and nontender. No organomegaly or masses felt. Normal bowel sounds heard. Central nervous system: Alert and oriented. Extremities: Symmetric 5 x 5 power.  Hip with clean dressing Skin: No rashes, lesions or ulcers   Data Reviewed: I have personally reviewed following labs and imaging studies  CBC: Recent Labs  Lab 03/01/19 0117 03/01/19 0509 03/02/19 0418 03/03/19 0534 03/04/19 0252 03/05/19 1130  WBC 13.1* 12.3* 9.6 9.4 10.2 18.7*  NEUTROABS 11.6*  --  7.5 7.1 8.1*  --   HGB 14.2 14.3 12.7* 12.0* 12.4* 11.6*  HCT 43.0 43.6 36.5* 34.7* 37.1* 34.6*  MCV 85.3 86.2 83.7 84.6 83.7 85.4  PLT 144* 128* 141* 158 183 122   Basic Metabolic Panel: Recent Labs  Lab 03/01/19 0509 03/02/19 0418 03/03/19 0534 03/04/19 0252 03/05/19 1130  NA 128* 128* 131* 133* 131*  K 4.1 3.4* 3.4* 3.5 4.2  CL 91* 94* 97* 99 97*  CO2 25 22 23  21* 20*  GLUCOSE 103* 120* 127* 135* 172*  BUN 16 15 10  7* 16  CREATININE 1.06 0.99 0.86 0.79 1.08  CALCIUM 8.5* 7.9* 8.1* 8.3* 8.4*  MG  --  1.7 1.9 1.9  --    GFR: Estimated Creatinine Clearance: 53.5 mL/min (by C-G formula based on SCr of 1.08 mg/dL). Liver Function Tests: Recent Labs  Lab 03/01/19 0117 03/01/19 0509 03/02/19 0418 03/03/19 0534 03/04/19 0252  AST 34 36 17 31 27   ALT 16 18 15 23 29   ALKPHOS 64 56 52 56 76  BILITOT 1.7* 1.9* 1.4* 1.0 1.1  PROT 5.9* 6.0* 5.6* 5.2* 5.6*  ALBUMIN 2.7* 2.6* 2.4* 2.4* 2.4*   No results for input(s): LIPASE, AMYLASE in the last 168 hours. No results for input(s): AMMONIA in the last 168 hours. Coagulation Profile: Recent Labs  Lab 03/01/19 0619  INR 1.1   Cardiac Enzymes: Recent Labs  Lab 03/01/19 0509 03/01/19 0943 03/01/19 1610  TROPONINI 0.06* 0.05* 0.05*   BNP (last 3 results) No results for input(s): PROBNP in the last 8760  hours. HbA1C: No results for input(s): HGBA1C in the last 72 hours. CBG: Recent Labs  Lab 03/03/19 2112 03/04/19 0951 03/04/19 1729 03/04/19 2111 03/05/19 0756  GLUCAP 143* 149* 183* 160* 144*   Lipid Profile: No results for input(s): CHOL, HDL, LDLCALC, TRIG, CHOLHDL, LDLDIRECT in the last 72 hours. Thyroid Function Tests: No results for input(s): TSH, T4TOTAL, FREET4, T3FREE, THYROIDAB in the last 72 hours. Anemia Panel: No results for input(s): VITAMINB12, FOLATE, FERRITIN, TIBC, IRON, RETICCTPCT in the last 72 hours. Sepsis Labs: Recent Labs  Lab 03/01/19 0509 03/01/19 0943  PROCALCITON 0.89  --   LATICACIDVEN  --  2.1*    Recent Results (from the past 240 hour(s))  Blood culture (routine x 2)     Status: None (Preliminary result)   Collection Time: 03/01/19  3:37 AM  Result Value Ref Range Status   Specimen Description BLOOD BLOOD RIGHT FOREARM  Final   Special Requests  Final    BOTTLES DRAWN AEROBIC AND ANAEROBIC Blood Culture adequate volume   Culture   Final    NO GROWTH 4 DAYS Performed at [facility], [place] [address] [place], [place] [address]    Report Status PENDING  Incomplete  Blood culture (routine x 2)     Status: None (Preliminary result)   Collection Time: 03/01/19  3:43 AM  Result Value Ref Range Status   Specimen Description BLOOD LEFT WRIST  Final   Special Requests   Final    BOTTLES DRAWN AEROBIC ONLY Blood Culture results may not be optimal due to an excessive volume of blood received in culture bottles   Culture   Final    NO GROWTH 4 DAYS Performed at [facility], [place] [address] [place], [place] [address]    Report Status PENDING  Incomplete  Novel Coronavirus, NAA (hospital order; send-out to ref lab)     Status: None   Collection Time: 03/01/19  5:20 AM  Result Value Ref Range Status   SARS-CoV-2, NAA NOT DETECTED NOT DETECTED Final    Comment: Negative (Not Detected) results do not exclude infection caused by SARS  CoV 2 and should not be used as the sole basis for treatment or other patient management decisions. Optimum specimen types and timing for peak viral levels during infections caused  by SARS CoV 2 have not been determined. Collection of multiple specimens (types and time points) from the same patient may be necessary to detect the virus. Improper specimen collection and handling, sequence variability underlying assay primers and or probes, or the presence of organisms in  quantities less than the limit of detection of the assay may lead to false negative results. Positive and negative predictive values of testing are highly dependent on prevalence. False negative results are more likely when prevalence of disease is high. (NOTE) The expected result is Negative (Not Detected). The SARS CoV 2 test is intended for the presumptive qualitative  detection of nucleic acid from SARS CoV 2 in upper and lower  respir atory specimens. Testing methodology is real time RT PCR. Test results must be correlated with clinical presentation and  evaluated in the context of other laboratory and epidemiologic data.  Test performance can be affected because the epidemiology and  clinical spectrum of infection caused by SARS CoV 2 is not fully  known. For example, the optimum types of specimens to collect and  when during the course of infection these specimens are most likely  to contain detectable viral RNA may not be known. This test has not been Food and Drug Administration (FDA) cleared or  approved and has been authorized by FDA under an Emergency Use  Authorization (EUA). The test is only authorized for the duration of  the declaration that circumstances exist justifying the authorization  of emergency use of in vitro diagnostic tests for detection and or  diagnosis of SARS CoV 2 under Section 564(b)(1) of the Act, 21 U.S.C.  section [phone] 3(b)(1), unless the authorization is terminated or   revoked  sooner. [facility] is certified under the  Clinical Laboratory Improvement Amendments of 1988 (CLIA), 42 U.S.C.  section [phone], to perform high complexity tests. Performed at [id number] [address], [place], [place] [address] Laboratory Director: Loleta Books, MD Fact Sheet for Healthcare Providers  BankingDealers.co.za Fact Sheet for Patients  StrictlyIdeas.no    Coronavirus Source NASOPHARYNGEAL  Final    Comment: Performed at [facility], [place] [address] [place], [place] [address]  Respiratory Panel by PCR     Status: None   Collection Time: 03/01/19  5:20 AM  Result Value Ref Range Status   Adenovirus NOT DETECTED NOT DETECTED Final   Coronavirus 229E NOT DETECTED NOT DETECTED Final    Comment: (NOTE) The Coronavirus on the Respiratory Panel, DOES NOT test for the novel  Coronavirus (2019 nCoV)    Coronavirus HKU1 NOT DETECTED NOT DETECTED Final   Coronavirus NL63 NOT DETECTED NOT DETECTED Final   Coronavirus OC43 NOT DETECTED NOT DETECTED Final   Metapneumovirus NOT DETECTED NOT DETECTED Final   Rhinovirus / Enterovirus NOT DETECTED NOT DETECTED Final   Influenza A NOT DETECTED NOT DETECTED Final   Influenza B NOT DETECTED NOT DETECTED Final   Parainfluenza Virus 1 NOT DETECTED NOT DETECTED Final   Parainfluenza Virus 2 NOT DETECTED NOT DETECTED Final   Parainfluenza Virus 3 NOT DETECTED NOT DETECTED Final   Parainfluenza Virus 4 NOT DETECTED NOT DETECTED Final   Respiratory Syncytial Virus NOT DETECTED NOT DETECTED Final   Bordetella pertussis NOT DETECTED NOT DETECTED Final   Chlamydophila pneumoniae NOT DETECTED NOT DETECTED Final   Mycoplasma pneumoniae NOT DETECTED NOT DETECTED Final    Comment: Performed at [facility], [place]. [address], [place], [place] [address]  MRSA PCR Screening     Status: None   Collection Time: 03/02/19 11:11  AM  Result Value Ref Range Status   MRSA by PCR NEGATIVE NEGATIVE Final    Comment:        The GeneXpert MRSA Assay (FDA approved for NASAL specimens only), is one component of a comprehensive MRSA colonization surveillance program. It is not intended to diagnose MRSA infection nor to guide or monitor treatment for MRSA infections. Performed at [facility], [place] [address], [place], [place] [address]          Radiology Studies: Pelvis Portable  Result Date: 03/04/2019 CLINICAL DATA:  Post left hip arthroplasty. EXAM: PORTABLE PELVIS 1-2 VIEWS COMPARISON:  Preoperative radiographs 02/28/2010 FINDINGS: Left hip arthroplasty in expected alignment. No periprosthetic lucency or fracture. Recent postsurgical change includes air and edema in the soft tissues and skin staples. IMPRESSION: Left hip arthroplasty without immediate postoperative complication. Electronically Signed   By: Keith Rake M.D.   On: 03/04/2019 19:12   Dg C-arm 1-60 Min  Result Date: 03/04/2019 CLINICAL DATA:  Hip fracture. EXAM: DG C-ARM 61-120 MIN; OPERATIVE LEFT HIP WITH PELVIS COMPARISON:  03/01/2019 FINDINGS: Left hip hemiarthroplasty for fracture. Normal alignment. No acute complication. IMPRESSION: Satisfactory left hip hemiarthroplasty for fracture. Electronically Signed   By: Franchot Gallo M.D.   On: 03/04/2019 11:13   Dg Hip Operative Unilat W Or W/o Pelvis Left  Result Date: 03/04/2019 CLINICAL DATA:  Hip fracture. EXAM: DG C-ARM 61-120 MIN; OPERATIVE LEFT HIP WITH PELVIS COMPARISON:  03/01/2019 FINDINGS: Left hip hemiarthroplasty for fracture. Normal alignment. No acute complication. IMPRESSION: Satisfactory left hip hemiarthroplasty for fracture. Electronically Signed   By: Franchot Gallo M.D.   On: 03/04/2019 11:13        Scheduled Meds: . aspirin  81 mg Oral BID PC  . azithromycin  500 mg Oral Daily  . cefdinir  300 mg Oral Q12H  . cholecalciferol  2,000 Units Oral Daily  . docusate  sodium  100 mg Oral BID  . insulin aspart  0-9 Units Subcutaneous TID WC  .  magnesium oxide  200 mg Oral Q supper  . oxybutynin  5 mg Oral BID  . pantoprazole  40 mg Oral Daily  . pravastatin  40 mg Oral QHS  . primidone  50 mg Oral QHS  . tamsulosin  0.4 mg Oral QPC supper   Continuous Infusions:   LOS: 4 days    Time spent: 35 minutes.     Elmarie Shiley, MD [facility] Pager [phone]  If 7PM-7AM, please contact night-coverage [email or web address] Password [facility] 03/05/2019, 12:31 PM

## 2019-03-05 NOTE — Progress Notes (Addendum)
Occupational Therapy Treatment Patient Details Name: Anthony [name] MRN: [id number] DOB: [birth year] Today's Date: 03/05/2019    History of present illness Pt is a 85 yo male s/p fall resulting in partial L hip hemiarthroplasty. PMHx: RA, Crohn's dx, IDDM, HTN, HLD, CHF, CAD heart failure, sleep apnea   OT comments  Staff attempting to assist bed <-> BSC transfer. Pt attempting x3 sit to stands with maxA+3 with bed elevated and pt unable to perform safely. Pt assisted to bed maxA+2. Pt does not trust LLE for stability.  Pt unable to comprehend multi step commands. OT to follow acutely.    Follow Up Recommendations  SNF;Supervision/Assistance - 24 hour    Equipment Recommendations  Other (comment)(to be determined at next venue)    Recommendations for Other Services      Precautions / Restrictions Precautions Precautions: Fall Restrictions Weight Bearing Restrictions: Yes LLE Weight Bearing: Weight bearing as tolerated       Mobility Bed Mobility Overal bed mobility: Needs Assistance Bed Mobility: Sidelying to Sit;Supine to Sit   Sidelying to sit: Max assist;HOB elevated Supine to sit: Max assist     General bed mobility comments: using bed pad to helicopter trunk and BLEs to EOB.  Transfers Overall transfer level: Needs assistance Equipment used: Rolling walker (2 wheeled) Transfers: Sit to/from [facility] Sit to Stand: Mod assist;+2 physical assistance;+2 safety/equipment;From elevated surface Stand pivot transfers: Mod assist;+2 physical assistance;+2 safety/equipment;From elevated surface(stedy)       General transfer comment: multiple verbal cues for proper technique attempting maxA +3 for sit to stand to [facility], unable pt returned to bed for bed pan.    Balance Overall balance assessment: Needs assistance Sitting-balance support: Bilateral upper extremity supported Sitting balance-Leahy Scale: Fair     Standing balance support: Bilateral  upper extremity supported Standing balance-Leahy Scale: Poor                             ADL either performed or assessed with clinical judgement   ADL Overall ADL's : Needs assistance/impaired Eating/Feeding: Set up;Sitting   Grooming: Moderate assistance;Wash/dry hands;Wash/dry face;Oral care;Sitting   Upper Body Bathing: Moderate assistance;Sitting   Lower Body Bathing: Maximal assistance;Total assistance;Sitting/lateral leans;Sit to/from stand   Upper Body Dressing : Moderate assistance;Sitting   Lower Body Dressing: Maximal assistance;Total assistance;Sitting/lateral leans;Sit to/from stand   Toilet Transfer: Moderate assistance;+2 for physical assistance;+2 for safety/equipment;BSC   Toileting- Clothing Manipulation and Hygiene: Maximal assistance;Total assistance;Sit to/from stand;Sitting/lateral lean       Functional mobility during ADLs: Total assistance;Rolling walker(usign stedy) General ADL Comments: Pt attempting sit to stand 3 attempts maxA +3 and unable to pt used bed pan.     Vision Baseline Vision/History: Wears glasses Vision Assessment?: No apparent visual deficits   Perception     Praxis      Cognition Arousal/Alertness: Awake/alert Behavior During Therapy: WFL for tasks assessed/performed Overall Cognitive Status: History of cognitive impairments - at baseline                                          Exercises Exercises: Other exercises Other Exercises Other Exercises: Ankle pump x5 reps   Shoulder Instructions       General Comments      Pertinent Vitals/ Pain          Home Living Family/patient expects to  be discharged to:: Skilled nursing facility                                        Prior Functioning/Environment Level of Independence: Needs assistance  Gait / Transfers Assistance Needed: W/C mostly for mobility ADL's / Homemaking Assistance Needed: Daughter reports pt required  assist for all ADL/IADL except for feeding himself.        Frequency  Min 3X/week        Progress Toward Goals  OT Goals(current goals can now be found in the care plan section)     Acute Rehab OT Goals Patient Stated Goal: to go back home OT Goal Formulation: With patient Time For Goal Achievement: 03/19/19 Potential to Achieve Goals: Fair ADL Goals Pt Will Perform Grooming: with set-up;sitting Pt Will Transfer to Toilet: with mod assist;stand pivot transfer;bedside commode Additional ADL Goal #1: pt will stand supported x4 mins of static standing in prep for ADL  Plan      Co-evaluation                 AM-PAC OT "6 Clicks" Daily Activity     Outcome Measure   Help from another person eating meals?: A Little Help from another person taking care of personal grooming?: A Little Help from another person toileting, which includes using toliet, bedpan, or urinal?: Total Help from another person bathing (including washing, rinsing, drying)?: A Lot Help from another person to put on and taking off regular upper body clothing?: A Little Help from another person to put on and taking off regular lower body clothing?: Total 6 Click Score: 13    End of Session Equipment Utilized During Treatment: Gait belt;Rolling walker  OT Visit Diagnosis: Unsteadiness on feet (R26.81);Muscle weakness (generalized) (M62.81);Pain Pain - Right/Left: Left Pain - part of body: Leg   Activity Tolerance Patient limited by pain;Patient tolerated treatment well   Patient Left with call bell/phone within reach;with chair alarm set;in chair   Nurse Communication Mobility status;Need for lift equipment;Weight bearing status        Time: 8032-1224 OT Time Calculation (min): 10 min  Charges: OT General Charges $OT Visit: 1 Visit OT Treatments $Self Care/Home Management : 8-22 mins  Darryl Nestle) Marsa Aris OTR/L Acute Rehabilitation Services Pager: [phone] Office:  [phone]    Anthony [name] 03/05/2019, 3:43 PM

## 2019-03-05 NOTE — Plan of Care (Signed)
  Problem: Pain Managment: Goal: General experience of comfort will improve Outcome: Progressing   Problem: Safety: Goal: Ability to remain free from injury will improve Outcome: Progressing   Problem: Pain Management: Goal: Pain level will decrease with appropriate interventions Outcome: Progressing

## 2019-03-05 NOTE — Progress Notes (Signed)
Subjective: 1 Day Post-Op Procedure(s) (LRB): LEFT HIP HEMIARTHROPLASTY (Left) Patient reports pain as mild.    Objective: Vital signs in last 24 hours: Temp:  [97.5 F (36.4 C)-98.2 F (36.8 C)] 97.8 F (36.6 C) (04/12 0432) Pulse Rate:  [99-111] 101 (04/12 0432) Resp:  [14-24] 16 (04/12 0432) BP: (107-140)/(55-83) 115/73 (04/12 0432) SpO2:  [85 %-100 %] 95 % (04/12 0432)  Intake/Output from previous day: 04/11 0701 - 04/12 0700 In: 750 [I.V.:700; IV Piggyback:50] Out: 1500 [Urine:1300; Blood:200] Intake/Output this shift: No intake/output data recorded.  Recent Labs    03/03/19 0534 03/04/19 0252  HGB 12.0* 12.4*   Recent Labs    03/03/19 0534 03/04/19 0252  WBC 9.4 10.2  RBC 4.10* 4.43  HCT 34.7* 37.1*  PLT 158 183   Recent Labs    03/03/19 0534 03/04/19 0252  NA 131* 133*  K 3.4* 3.5  CL 97* 99  CO2 23 21*  BUN 10 7*  CREATININE 0.86 0.79  GLUCOSE 127* 135*  CALCIUM 8.1* 8.3*   No results for input(s): LABPT, INR in the last 72 hours.  Sensation intact distally Intact pulses distally Dorsiflexion/Plantar flexion intact Incision: dressing C/D/I, no drainage and scant drainage   Assessment/Plan: 1 Day Post-Op Procedure(s) (LRB): LEFT HIP HEMIARTHROPLASTY (Left) Up with therapy Discharge to SNF when medically clear      Mcarthur Rossetti 03/05/2019, 9:55 AM

## 2019-03-05 NOTE — Evaluation (Signed)
Physical Therapy Evaluation Patient Details Name: Anthony [name] MRN: [id number] DOB: 26-Feb-1934 Today's Date: 03/05/2019   History of Present Illness  Pt is a 85 yo male s/p fall resulting in partial L hip hemiarthroplasty. PMHx: RA, Crohn's dx, IDDM, HTN, HLD, CHF, CAD heart failure, sleep apnea  Clinical Impression  Pt admitted with above diagnosis. Pt currently with functional limitations due to the deficits listed below (see PT Problem List). Pt presenting with decreased functional mobility secondary to L hip pain, weakness, decreased range of motion, and baseline cognitive impairments. Requiring two person assistance to stand using Denna Haggard and transfer over to chair. Pt will benefit from skilled PT to increase their independence and safety with mobility to allow discharge to the venue listed below.       Follow Up Recommendations SNF    Equipment Recommendations  None recommended by PT    Recommendations for Other Services       Precautions / Restrictions Precautions Precautions: Fall Restrictions Weight Bearing Restrictions: Yes LLE Weight Bearing: Weight bearing as tolerated      Mobility  Bed Mobility Overal bed mobility: Needs Assistance Bed Mobility: Sidelying to Sit;Supine to Sit   Sidelying to sit: Max assist;HOB elevated Supine to sit: Max assist     General bed mobility comments: using bed pad to helicopter trunk and BLEs to EOB.  Transfers Overall transfer level: Needs assistance Equipment used: Rolling walker (2 wheeled) Transfers: Sit to/from Stand Sit to Stand: Mod assist;+2 physical assistance Stand pivot transfers: Mod assist;+2 physical assistance;+2 safety/equipment;From elevated surface(stedy)       General transfer comment: Heavy modA + 2 to stand to [facility] from elevated bed using gait belt  Ambulation/Gait                Stairs            Wheelchair Mobility    Modified Rankin (Stroke Patients Only)       Balance  Overall balance assessment: Needs assistance Sitting-balance support: Bilateral upper extremity supported Sitting balance-Leahy Scale: Fair     Standing balance support: Bilateral upper extremity supported Standing balance-Leahy Scale: Poor                               Pertinent Vitals/Pain Pain Assessment: Faces Faces Pain Scale: Hurts little more Pain Location: LLE Pain Descriptors / Indicators: Operative site guarding Pain Intervention(s): Monitored during session    Home Living Family/patient expects to be discharged to:: Skilled nursing facility                      Prior Function Level of Independence: Needs assistance   Gait / Transfers Assistance Needed: W/C mostly for mobility  ADL's / Homemaking Assistance Needed: Daughter reports pt required assist for all ADL/IADL except for feeding himself.         Hand Dominance   Dominant Hand: Right    Extremity/Trunk Assessment   Upper Extremity Assessment Upper Extremity Assessment: Generalized weakness    Lower Extremity Assessment Lower Extremity Assessment: Generalized weakness LLE Deficits / Details: s/p hemiarthroplasty    Cervical / Trunk Assessment Cervical / Trunk Assessment: Kyphotic;Other exceptions Cervical / Trunk Exceptions: knees flexed  Communication   Communication: No difficulties  Cognition Arousal/Alertness: Awake/alert Behavior During Therapy: WFL for tasks assessed/performed Overall Cognitive Status: History of cognitive impairments - at baseline  General Comments      Exercises Other Exercises Other Exercises: Ankle pump x5 reps   Assessment/Plan    PT Assessment Patient needs continued PT services  PT Problem List Decreased strength;Decreased activity tolerance;Decreased range of motion;Decreased balance;Decreased mobility;Decreased cognition;Pain       PT Treatment Interventions DME  instruction;Functional mobility training;Therapeutic activities;Therapeutic exercise;Balance training;Patient/family education    PT Goals (Current goals can be found in the Care Plan section)  Acute Rehab PT Goals Patient Stated Goal: to go back home PT Goal Formulation: With patient Time For Goal Achievement: 03/19/19 Potential to Achieve Goals: Fair    Frequency Min 3X/week   Barriers to discharge        Co-evaluation PT/OT/SLP Co-Evaluation/Treatment: Yes Reason for Co-Treatment: To address functional/ADL transfers;For patient/therapist safety PT goals addressed during session: Mobility/safety with mobility         AM-PAC PT "6 Clicks" Mobility  Outcome Measure Help needed turning from your back to your side while in a flat bed without using bedrails?: A Lot Help needed moving from lying on your back to sitting on the side of a flat bed without using bedrails?: Total Help needed moving to and from a bed to a chair (including a wheelchair)?: Total Help needed standing up from a chair using your arms (e.g., wheelchair or bedside chair)?: Total Help needed to walk in hospital room?: Total Help needed climbing 3-5 steps with a railing? : Total 6 Click Score: 7    End of Session Equipment Utilized During Treatment: Gait belt Activity Tolerance: Patient tolerated treatment well Patient left: in chair;with call bell/phone within reach;with chair alarm set Nurse Communication: Mobility status;Need for lift equipment PT Visit Diagnosis: Muscle weakness (generalized) (M62.81);Other abnormalities of gait and mobility (R26.89);Pain Pain - Right/Left: Left Pain - part of body: Hip    Time: 5075-7322 PT Time Calculation (min) (ACUTE ONLY): 22 min   Charges:   PT Evaluation $PT Eval Moderate Complexity: 1 Mod         Ellamae Sia, [place], DPT Acute Rehabilitation Services Pager [phone] Office [phone]   Anthony [name] 03/05/2019, 3:53 PM

## 2019-03-05 NOTE — Evaluation (Signed)
Occupational Therapy Evaluation Patient Details Name: Anthony [name] MRN: [id number] DOB: 11/28/1933 Today's Date: 03/05/2019    History of Present Illness Pt is a 85 yo male s/p fall resulting in partial L hip hemiarthroplasty. PMHx: RA, Crohn's dx, IDDM, HTN, HLD, CHF, CAD heart failure, sleep apnea   Clinical Impression   Pt PTA: living in NH and assisted with ADL and mobility using W/C per chart notes. Pt currently maxA for bed mobility; transfers with modA+2 with stedy +2 attempts as pt was unable to fully stand well on R side. Pt performing ADL with modA for UB and maxA to [place] for LB ADL. Pt tolerating session well with pain noted in standing. Pt with no recall of events as to why he was at the hospital. Pt following all commands and very weak s/p fall at [facility]. Pt would greatly benefit from continued OT skilled services for ADL, mobility and safety. OT to follow acutely.     Follow Up Recommendations  SNF;Supervision/Assistance - 24 hour    Equipment Recommendations  Other (comment)(to be determined at next venue)    Recommendations for Other Services       Precautions / Restrictions Precautions Precautions: Fall Restrictions Weight Bearing Restrictions: Yes LLE Weight Bearing: Weight bearing as tolerated      Mobility Bed Mobility Overal bed mobility: Needs Assistance Bed Mobility: Sidelying to Sit;Supine to Sit   Sidelying to sit: Max assist;HOB elevated Supine to sit: Max assist     General bed mobility comments: using bed pad to helicopter trunk and BLEs to EOB.  Transfers Overall transfer level: Needs assistance Equipment used: Rolling walker (2 wheeled) Transfers: Sit to/from [facility] Sit to Stand: Mod assist;+2 physical assistance;+2 safety/equipment;From elevated surface Stand pivot transfers: Mod assist;+2 physical assistance;+2 safety/equipment;From elevated surface(stedy)       General transfer comment: multiple verbal cues for  proper technique.    Balance Overall balance assessment: Needs assistance Sitting-balance support: Bilateral upper extremity supported Sitting balance-Leahy Scale: Fair     Standing balance support: Bilateral upper extremity supported Standing balance-Leahy Scale: Poor                             ADL either performed or assessed with clinical judgement   ADL Overall ADL's : Needs assistance/impaired Eating/Feeding: Set up;Sitting   Grooming: Moderate assistance;Wash/dry hands;Wash/dry face;Oral care;Sitting   Upper Body Bathing: Moderate assistance;Sitting   Lower Body Bathing: Maximal assistance;Total assistance;Sitting/lateral leans;Sit to/from stand   Upper Body Dressing : Moderate assistance;Sitting   Lower Body Dressing: Maximal assistance;Total assistance;Sitting/lateral leans;Sit to/from stand   Toilet Transfer: Moderate assistance;+2 for physical assistance;+2 for safety/equipment;BSC   Toileting- Clothing Manipulation and Hygiene: Maximal assistance;Total assistance;Sit to/from stand;Sitting/lateral lean       Functional mobility during ADLs: Moderate assistance;+2 for physical assistance;+2 for safety/equipment;Rolling walker(usign stedy) General ADL Comments: Pt requiring modA for UB ADL and maxA to [place] for LB ADL. Pt has no idea why he is at the hospital.     Vision Baseline Vision/History: Wears glasses Vision Assessment?: No apparent visual deficits     Perception     Praxis      Pertinent Vitals/Pain       Hand Dominance Right   Extremity/Trunk Assessment Upper Extremity Assessment Upper Extremity Assessment: Generalized weakness   Lower Extremity Assessment Lower Extremity Assessment: Defer to PT evaluation;Generalized weakness   Cervical / Trunk Assessment Cervical / Trunk Assessment: Kyphotic;Other exceptions Cervical / Trunk  Exceptions: knees flexed   Communication Communication Communication: No difficulties    Cognition Arousal/Alertness: Awake/alert Behavior During Therapy: WFL for tasks assessed/performed Overall Cognitive Status: History of cognitive impairments - at baseline                                     General Comments       Exercises Exercises: Other exercises Other Exercises Other Exercises: Ankle pump x5 reps   Shoulder Instructions      Home Living Family/patient expects to be discharged to:: Skilled nursing facility                                        Prior Functioning/Environment Level of Independence: Needs assistance  Gait / Transfers Assistance Needed: W/C mostly for mobility ADL's / Homemaking Assistance Needed: Daughter reports pt required assist for all ADL/IADL except for feeding himself.             OT Problem List: Decreased strength;Decreased activity tolerance;Decreased range of motion;Impaired balance (sitting and/or standing);Decreased safety awareness;Decreased cognition;Pain      OT Treatment/Interventions: Self-care/ADL training;Therapeutic exercise;Neuromuscular education;Energy conservation;Therapeutic activities;Patient/family education;Balance training    OT Goals(Current goals can be found in the care plan section) Acute Rehab OT Goals Patient Stated Goal: to go back home OT Goal Formulation: With patient Time For Goal Achievement: 03/19/19 Potential to Achieve Goals: Fair ADL Goals Pt Will Perform Grooming: with set-up;sitting Pt Will Transfer to Toilet: with mod assist;stand pivot transfer;bedside commode Additional ADL Goal #1: pt will stand supported x4 mins of static standing in prep for ADL  OT Frequency: Min 3X/week   Barriers to D/C:            Co-evaluation              AM-PAC OT "6 Clicks" Daily Activity     Outcome Measure Help from another person eating meals?: A Little Help from another person taking care of personal grooming?: A Little Help from another person toileting,  which includes using toliet, bedpan, or urinal?: Total Help from another person bathing (including washing, rinsing, drying)?: A Lot Help from another person to put on and taking off regular upper body clothing?: A Little Help from another person to put on and taking off regular lower body clothing?: Total 6 Click Score: 13   End of Session Equipment Utilized During Treatment: Gait belt;Rolling walker Nurse Communication: Mobility status;Need for lift equipment;Weight bearing status  Activity Tolerance: Patient limited by pain;Patient tolerated treatment well Patient left: with call bell/phone within reach;with chair alarm set;in chair  OT Visit Diagnosis: Unsteadiness on feet (R26.81);Muscle weakness (generalized) (M62.81);Pain Pain - Right/Left: Left Pain - part of body: Leg                Time: 4128-7867 OT Time Calculation (min): 23 min Charges:  OT General Charges $OT Visit: 1 Visit OT Evaluation $OT Eval Moderate Complexity: 1 Mod  Darryl Nestle) Marsa Aris OTR/L Acute Rehabilitation Services Pager: [phone] Office: [place] 03/05/2019, 3:35 PM

## 2019-03-05 NOTE — Progress Notes (Addendum)
CSW consult received. Spoke with patient's daughter who confirms Anthony [name] will accept patient back. Needing PT/OT evaluation for level of care. Notified MD of needed PT/OT consult. Once evaluated, CSW can facilitate discharge based on needs. Will continue to assist as needed.

## 2019-03-05 NOTE — Progress Notes (Signed)
Orthopedic Tech Progress Note Patient Details:  Anthony [name] 1934-01-16 [id number] Applied Over Head Frame and Trapeze Patient ID: Anthony [name], male   DOB: 1934-10-05, 85 y.o.   MRN: [id number]   Janit Pagan 03/05/2019, 9:53 AM

## 2019-03-06 ENCOUNTER — Encounter (HOSPITAL_COMMUNITY): Payer: Self-pay | Admitting: Orthopaedic Surgery

## 2019-03-06 LAB — CBC
HCT: 33.1 % — ABNORMAL LOW (ref 39.0–52.0)
Hemoglobin: 11.2 g/dL — ABNORMAL LOW (ref 13.0–17.0)
MCH: 28.9 pg (ref 26.0–34.0)
MCHC: 33.8 g/dL (ref 30.0–36.0)
MCV: 85.3 fL (ref 80.0–100.0)
Platelets: 259 10*3/uL (ref 150–400)
RBC: 3.88 MIL/uL — ABNORMAL LOW (ref 4.22–5.81)
RDW: 14.6 % (ref 11.5–15.5)
WBC: 13.2 10*3/uL — ABNORMAL HIGH (ref 4.0–10.5)
nRBC: 0 % (ref 0.0–0.2)

## 2019-03-06 LAB — GLUCOSE, CAPILLARY
Glucose-Capillary: 128 mg/dL — ABNORMAL HIGH (ref 70–99)
Glucose-Capillary: 105 mg/dL — ABNORMAL HIGH (ref 70–99)
Glucose-Capillary: 113 mg/dL — ABNORMAL HIGH (ref 70–99)
Glucose-Capillary: 129 mg/dL — ABNORMAL HIGH (ref 70–99)

## 2019-03-06 LAB — CULTURE, BLOOD (ROUTINE X 2)
Culture: NO GROWTH
Culture: NO GROWTH
Special Requests: ADEQUATE

## 2019-03-06 LAB — BASIC METABOLIC PANEL
Anion gap: 10 (ref 5–15)
BUN: 12 mg/dL (ref 8–23)
CO2: 26 mmol/L (ref 22–32)
Calcium: 8.4 mg/dL — ABNORMAL LOW (ref 8.9–10.3)
Chloride: 97 mmol/L — ABNORMAL LOW (ref 98–111)
Creatinine, Ser: 0.78 mg/dL (ref 0.61–1.24)
GFR calc Af Amer: >60 mL/min (ref >60)
GFR calc non Af Amer: >60 mL/min (ref >60)
Glucose, Bld: 124 mg/dL — ABNORMAL HIGH (ref 70–99)
Potassium: 3.9 mmol/L (ref 3.5–5.1)
Sodium: 133 mmol/L — ABNORMAL LOW (ref 135–145)

## 2019-03-06 LAB — MAGNESIUM: Magnesium: 2 mg/dL (ref 1.7–2.4)

## 2019-03-06 MED ORDER — CARVEDILOL 6.25 MG PO TABS
6.2500 mg | ORAL_TABLET | Freq: Two times a day (BID) | ORAL | Status: DC
  Administered 2019-03-06 – 2019-03-08 (×5): 6.25 mg via ORAL
  Filled 2019-03-06 (×5): qty 1

## 2019-03-06 MED ORDER — MAGNESIUM SULFATE 2 GM/50ML IV SOLN
2.0000 g | Freq: Once | INTRAVENOUS | Status: AC
  Administered 2019-03-06: 18:00:00 2 g via INTRAVENOUS
  Filled 2019-03-06: qty 50

## 2019-03-06 MED ORDER — SODIUM CHLORIDE 0.9 % IV SOLN
INTRAVENOUS | Status: DC | PRN
  Administered 2019-03-06: 18:00:00 250 mL via INTRAVENOUS

## 2019-03-06 MED ORDER — METOPROLOL TARTRATE 5 MG/5ML IV SOLN: 2.5000 mg | Freq: Four times a day (QID) | INTRAVENOUS | Status: DC | PRN

## 2019-03-06 MED ORDER — SODIUM CHLORIDE 0.9 % IV BOLUS
250.0000 mL | Freq: Once | INTRAVENOUS | Status: AC
  Administered 2019-03-06: 250 mL via INTRAVENOUS

## 2019-03-06 NOTE — TOC Progression Note (Signed)
Transition of Care [facility]) - Progression Note    Patient Details  Name: KINNEY SACKMANN MRN: [id number] Date of Birth: 31-Aug-1934  Transition of Care [facility]) CM/SW [place], [place] Phone Number: 03/06/2019, 11:30 AM  Clinical Narrative:    CSW has initiated insurance approval through [facility] Advantage. Continuing to follow, per morning progression mtg pt not medically stable for d/c today.  Expected Discharge Plan: [place] Barriers to Discharge: No Barriers Identified  Expected Discharge Plan and Services Expected Discharge Plan: [place]   Discharge Planning Services: NA Post Acute Care Choice: [place] Living arrangements for the past 2 months: Skilled Nursing Facility([facility])                 DME Arranged: N/A DME Agency: NA HH Arranged: NA HH Agency: NA   Social Determinants of Health (SDOH) Interventions    Readmission Risk Interventions No flowsheet data found.

## 2019-03-06 NOTE — Consult Note (Signed)
   [facility] CM Inpatient Consult   03/06/2019  Anthony [name] 10/20/1934 [id number]    Patient screened for unplanned readmission and hospitalizations at 30% (extreme) with [employer] Ad[employer]age plan.  Patient was previously outreached by [facility] care management coordinators but not currently active.   Review of recent transition of care social worker note, states that expected discharge is SNF (skilled nursing facility).  Noted that patient is currently listed as engaged [facility] patient. He will be followed by [facility] in the community with full case management services.   [facility] care management services are not appropriate at this time.   Will sign off.   For questions and additional information, please contact:  Hurshel Bouillon A. Clanton Emanuelson, BSN, RN-BC [facility] Liaison Cell: [phone]

## 2019-03-06 NOTE — Progress Notes (Signed)
Physical Therapy Treatment Patient Details Name: Anthony [name] MRN: [id number] DOB: October 19, 1934 Today's Date: 03/06/2019    History of Present Illness Pt is a 85 yo male s/p fall resulting in partial L hip hemiarthroplasty. PMHx: RA, Crohn's dx, IDDM, HTN, HLD, CHF, CAD heart failure, sleep apnea    PT Comments    Patient more rigid and unable to stand this session despite using stedy and elevated height of bed, pt pushing back against bar on stedy.  Feel may be due to dementia as well as some component of orthostatic hypotension.  Feel should progress, however slowly, so remains appropriate for SNF level rehab.  PT to follow acutely.   Follow Up Recommendations  SNF     Equipment Recommendations  None recommended by PT    Recommendations for Other Services       Precautions / Restrictions Precautions Precautions: Fall Restrictions LLE Weight Bearing: Weight bearing as tolerated    Mobility  Bed Mobility Overal bed mobility: Needs Assistance Bed Mobility: Supine to Sit   Sidelying to sit: Max assist;+2 for physical assistance;HOB elevated       General bed mobility comments: assist for shoulders and legs with use of bed pad to pivot hips  Transfers Overall transfer level: Needs assistance   Transfers: Sit to/from Stand Sit to Stand: Max assist;+2 physical assistance;From elevated surface         General transfer comment: attempted x 4 to stand from EOB with stedy, pt leaning posteriorly and pushing away from bar on stedy so unable to assist to stand, applied maximove sling in sitting and used lift to transfer to chair, RN aware  Ambulation/Gait                 Stairs             Wheelchair Mobility    Modified Rankin (Stroke Patients Only)       Balance Overall balance assessment: Needs assistance Sitting-balance support: Bilateral upper extremity supported Sitting balance-Leahy Scale: Zero         Standing balance comment: unable  to maintain sitting EOB without max support due to posterior pushing/leaning, at times resting back into support rather than pushing                            Cognition Arousal/Alertness: Awake/alert(falls asleep some during therex) Behavior During Therapy: Flat affect Overall Cognitive Status: No family/caregiver present to determine baseline cognitive functioning                                        Exercises General Exercises - Lower Extremity Ankle Circles/Pumps: AROM;10 reps;Both;Supine Heel Slides: AAROM;5 reps;Both;Supine    General Comments General comments (skin integrity, edema, etc.): Pt seated on recliner after OOB via lift, BP 86/64; after reclined in chair 100/54; RN aware and pt recieving fluid bolus during session      Pertinent Vitals/Pain Pain Assessment: Faces Faces Pain Scale: Hurts little more Pain Location: LLE, with movement, relaxed at rest Pain Descriptors / Indicators: Guarding;Grimacing;Operative site guarding Pain Intervention(s): Monitored during session;Repositioned;RN gave pain meds during session    Home Living                      Prior Function            PT Goals (current  goals can now be found in the care plan section) Progress towards PT goals: Not progressing toward goals - comment(more rigid, unable to stand and somewhat hypotensive)    Frequency    Min 2X/week      PT Plan Current plan remains appropriate;Frequency needs to be updated    Co-evaluation              AM-PAC PT "6 Clicks" Mobility   Outcome Measure  Help needed turning from your back to your side while in a flat bed without using bedrails?: Total Help needed moving from lying on your back to sitting on the side of a flat bed without using bedrails?: Total Help needed moving to and from a bed to a chair (including a wheelchair)?: Total Help needed standing up from a chair using your arms (e.g., wheelchair or bedside  chair)?: Total Help needed to walk in hospital room?: Total Help needed climbing 3-5 steps with a railing? : Total 6 Click Score: 6    End of Session Equipment Utilized During Treatment: Gait belt(lift equipment) Activity Tolerance: Patient limited by fatigue Patient left: with call bell/phone within reach;in chair;with chair alarm set Nurse Communication: Mobility status;Need for lift equipment PT Visit Diagnosis: Other abnormalities of gait and mobility (R26.89);Muscle weakness (generalized) (M62.81);Pain Pain - Right/Left: Left Pain - part of body: Hip     Time: 1019-1100 PT Time Calculation (min) (ACUTE ONLY): 41 min  Charges:  $Therapeutic Exercise: 8-22 mins $Therapeutic Activity: 23-37 mins                     Anthony [name], [place] Acute Rehabilitation Services [phone] 03/06/2019    Anthony [name] 03/06/2019, 12:03 PM

## 2019-03-06 NOTE — TOC Progression Note (Signed)
Transition of Care [facility]) - Progression Note    Patient Details  Name: Anthony [name] MRN: [id number] Date of Birth: 1934/03/11  Transition of [facility]) CM/SW [place], [place] Phone Number: 03/06/2019, 12:31 PM  Clinical Narrative:    Spoke with pt daughter [name] via phone- provided update that we have started insurance authorization, pt nearing medical stability and we would provide further update when we have that information.  Continuing to follow.    Expected Discharge Plan: [place] Barriers to Discharge: No Barriers Identified  Expected Discharge Plan and Services Expected Discharge Plan: [place]   Discharge Planning Services: NA Post Acute Care Choice: [place] Living arrangements for the past 2 months: Skilled Nursing Facility([facility])                 DME Arranged: N/A DME Agency: NA HH Arranged: NA HH Agency: NA   Social Determinants of Health (SDOH) Interventions    Readmission Risk Interventions Readmission Risk Prevention Plan 03/06/2019  Transportation Screening Complete  Medication Review Press photographer) Complete  PCP or Specialist appointment within 3-5 days of discharge Complete  HRI or [place] Complete  SW Recovery Care/Counseling Consult Complete  Palliative Care Screening Not [place] Complete  Some recent data might be hidden

## 2019-03-06 NOTE — Plan of Care (Signed)
  Problem: Safety: Goal: Ability to remain free from injury will improve Outcome: Progressing   

## 2019-03-06 NOTE — Progress Notes (Signed)
PROGRESS NOTE    Anthony [name]  PIR:[id number] DOB: 26-Aug-1934 DOA: 03/01/2019 PCP: No primary care provider on file.    Brief Narrative; 85 year old with past medical history significant for rheumatoid arthritis, coronary artery disease, Crohn's disease, IDDM, hypertension chronic systolic heart failure admitted on 03/01/2019 presented with complaint of fall was found to have left hip fracture.  Patient had an episode of fever on 4/6, he was noted to be hypoxic as well.  CT scan does not show typical classic groundglass opacity like ARDS picture.  He was tested for COVID-19 which was negative.  He was a low pretest probability. He underwent left hip bipolar hemiarthroplasty on 4-11.   Assessment & Plan:   Principal Problem:   Sepsis ([place]) Active Problems:   Suspected Covid-19 Virus Infection   Acute respiratory failure with hypoxia (HCC)   Hip fracture ([place])   Fall   Displaced fracture of base of neck of left femur, initial encounter for closed fracture (HCC)   Pressure injury of skin   1-Left femoral neck fracture: Unwitnessed fall on 02/27/2019. Status post left hip bipolar hemiarthroplasty on 4/11 2020. DVT prophylaxis per Ortho.  2-COVID with PCR negative Patient had an episode of fever, hypoxemia, CT chest did not show evidence of ARDS. Droplet contact precaution was discontinued.  3-Health care Associated pneumonia: Acute hypoxic respiratory failure.  Patient with Sirs criteria. CT PE protocol was negative for PE.  Possible atelectasis on initial CT scan.  Small groundglass nodule in the left lower lobe. Patient was a started on IV vancomycin, Zosyn.  Now on cefdinir and azithromycin. WBC trending down.   4-Hyponatremia; initially sodium at 128.  Stable.   5-Dementia: No behavioral disturbance. He is more alert today.  6-Chronic systolic heart failure. Continue to hold Lasix. Negative 2 L.   Overreactive bladder; continue with Ditropan rheumatoid arthritis: Not  currently on medication for this.  Leukocytosis: His respiratory symptom has improved. He is on oral antibiotics.  WBC trending down.   Tachycardia;  Increase coreg to home dose. IV bolus.    Acute blood loss anemia mild: Post surgery.  Monitor hemoglobin.  Pressure injury stage II buttocks Local care  Pressure Injury 03/04/19 Stage II -  Partial thickness loss of dermis presenting as a shallow open ulcer with a red, pink wound bed without slough. (Active)  03/04/19 2200  Location: Buttocks  Location Orientation: Right;Left  Staging: Stage II -  Partial thickness loss of dermis presenting as a shallow open ulcer with a red, pink wound bed without slough.  Wound Description (Comments):   Present on Admission:      Estimated body mass index is 25.84 kg/m as calculated from the following:   Height as of this encounter: 5' 10"  (1.778 m).   Weight as of this encounter: 81.7 kg.   DVT prophylaxis: Aspirin Code Status: DNR Family Communication: Well call  daughter Disposition Plan: Hopefully skilled nursing facility tomorrow  Consultants:   ortho   Procedures: Status post left hip bipolar hemiarthroplasty on 4/11 2020.   Antimicrobials:   cefdinir   Subjective: He is feeling well, denies chest pain or dyspnea.   Objective: Vitals:   03/06/19 0700 03/06/19 0855 03/06/19 0949 03/06/19 1058  BP: 114/60 114/60  (!) 100/57  Pulse:    94  Resp:    16  Temp:   98 F (36.7 C) 97.9 F (36.6 C)  TempSrc:   Oral Oral  SpO2:    90%  Weight:   81.7  kg   Height:        Intake/Output Summary (Last 24 hours) at 03/06/2019 1311 Last data filed at 03/06/2019 1107 Gross per 24 hour  Intake 585 ml  Output 800 ml  Net -215 ml   Filed Weights   03/01/19 0055 03/04/19 0434 03/06/19 0949  Weight: 79.4 kg 79.5 kg 81.7 kg    Examination:  General exam: NAD Respiratory system: CTA Cardiovascular system: S 1, S 2 RRR. Gastrointestinal system: BS present, soft, nt  Central nervous system: alert  Extremities: Symmetric 5 x 5 power.  Hip with clean dressing  Skin: No rashes, lesions or ulcers   Data Reviewed: I have personally reviewed following labs and imaging studies  CBC: Recent Labs  Lab 03/01/19 0117  03/02/19 0418 03/03/19 0534 03/04/19 0252 03/05/19 1130 03/06/19 0451  WBC 13.1*   < > 9.6 9.4 10.2 18.7* 13.2*  NEUTROABS 11.6*  --  7.5 7.1 8.1*  --   --   HGB 14.2   < > 12.7* 12.0* 12.4* 11.6* 11.2*  HCT 43.0   < > 36.5* 34.7* 37.1* 34.6* 33.1*  MCV 85.3   < > 83.7 84.6 83.7 85.4 85.3  PLT 144*   < > 141* 158 183 255 259   < > = values in this interval not displayed.   Basic Metabolic Panel: Recent Labs  Lab 03/02/19 0418 03/03/19 0534 03/04/19 0252 03/05/19 1130 03/06/19 0451  NA 128* 131* 133* 131* 133*  K 3.4* 3.4* 3.5 4.2 3.9  CL 94* 97* 99 97* 97*  CO2 22 23 21* 20* 26  GLUCOSE 120* 127* 135* 172* 124*  BUN 15 10 7* 16 12  CREATININE 0.99 0.86 0.79 1.08 0.78  CALCIUM 7.9* 8.1* 8.3* 8.4* 8.4*  MG 1.7 1.9 1.9  --   --    GFR: Estimated Creatinine Clearance: 72.2 mL/min (by C-G formula based on SCr of 0.78 mg/dL). Liver Function Tests: Recent Labs  Lab 03/01/19 0117 03/01/19 0509 03/02/19 0418 03/03/19 0534 03/04/19 0252  AST 34 36 17 31 27   ALT 16 18 15 23 29   ALKPHOS 64 56 52 56 76  BILITOT 1.7* 1.9* 1.4* 1.0 1.1  PROT 5.9* 6.0* 5.6* 5.2* 5.6*  ALBUMIN 2.7* 2.6* 2.4* 2.4* 2.4*   No results for input(s): LIPASE, AMYLASE in the last 168 hours. No results for input(s): AMMONIA in the last 168 hours. Coagulation Profile: Recent Labs  Lab 03/01/19 0619  INR 1.1   Cardiac Enzymes: Recent Labs  Lab 03/01/19 0509 03/01/19 0943 03/01/19 1610  TROPONINI 0.06* 0.05* 0.05*   BNP (last 3 results) No results for input(s): PROBNP in the last 8760 hours. HbA1C: No results for input(s): HGBA1C in the last 72 hours. CBG: Recent Labs  Lab 03/05/19 1234 03/05/19 1712 03/05/19 2149 03/06/19 0807 03/06/19  1147  GLUCAP 169* 153* 116* 128* 105*   Lipid Profile: No results for input(s): CHOL, HDL, LDLCALC, TRIG, CHOLHDL, LDLDIRECT in the last 72 hours. Thyroid Function Tests: No results for input(s): TSH, T4TOTAL, FREET4, T3FREE, THYROIDAB in the last 72 hours. Anemia Panel: No results for input(s): VITAMINB12, FOLATE, FERRITIN, TIBC, IRON, RETICCTPCT in the last 72 hours. Sepsis Labs: Recent Labs  Lab 03/01/19 0509 03/01/19 0943  PROCALCITON 0.89  --   LATICACIDVEN  --  2.1*    Recent Results (from the past 240 hour(s))  Blood culture (routine x 2)     Status: None   Collection Time: 03/01/19  3:37 AM  Result Value  Ref Range Status   Specimen Description BLOOD BLOOD RIGHT FOREARM  Final   Special Requests   Final    BOTTLES DRAWN AEROBIC AND ANAEROBIC Blood Culture adequate volume   Culture   Final    NO GROWTH 5 DAYS Performed at [facility], [address], [place], [place] [address]    Report Status 03/06/2019 FINAL  Final  Blood culture (routine x 2)     Status: None   Collection Time: 03/01/19  3:43 AM  Result Value Ref Range Status   Specimen Description BLOOD LEFT WRIST  Final   Special Requests   Final    BOTTLES DRAWN AEROBIC ONLY Blood Culture results may not be optimal due to an excessive volume of blood received in culture bottles   Culture   Final    NO GROWTH 5 DAYS Performed at [facility], [place] [address], [place], [place] [address]    Report Status 03/06/2019 FINAL  Final  Novel Coronavirus, NAA (hospital order; send-out to ref lab)     Status: None   Collection Time: 03/01/19  5:20 AM  Result Value Ref Range Status   SARS-CoV-2, NAA NOT DETECTED NOT DETECTED Final    Comment: Negative (Not Detected) results do not exclude infection caused by SARS CoV 2 and should not be used as the sole basis for treatment or other patient management decisions. Optimum specimen types and timing for peak viral levels during infections caused  by SARS CoV 2  have not been determined. Collection of multiple specimens (types and time points) from the same patient may be necessary to detect the virus. Improper specimen collection and handling, sequence variability underlying assay primers and or probes, or the presence of organisms in  quantities less than the limit of detection of the assay may lead to false negative results. Positive and negative predictive values of testing are highly dependent on prevalence. False negative results are more likely when prevalence of disease is high. (NOTE) The expected result is Negative (Not Detected). The SARS CoV 2 test is intended for the presumptive qualitative  detection of nucleic acid from SARS CoV 2 in upper and lower  respir atory specimens. Testing methodology is real time RT PCR. Test results must be correlated with clinical presentation and  evaluated in the context of other laboratory and epidemiologic data.  Test performance can be affected because the epidemiology and  clinical spectrum of infection caused by SARS CoV 2 is not fully  known. For example, the optimum types of specimens to collect and  when during the course of infection these specimens are most likely  to contain detectable viral RNA may not be known. This test has not been Food and Drug Administration (FDA) cleared or  approved and has been authorized by FDA under an Emergency Use  Authorization (EUA). The test is only authorized for the duration of  the declaration that circumstances exist justifying the authorization  of emergency use of in vitro diagnostic tests for detection and or  diagnosis of SARS CoV 2 under Section 564(b)(1) of the Act, 21 U.S.C.  section [phone] 3(b)(1), unless the authorization is terminated or   revoked sooner. [facility] is certified under the  Clinical Laboratory Improvement Amendments of 1988 (CLIA), 42 U.S.C.  section [phone], to perform high complexity tests. Performed at [facility] [id number], [place], [place] [address] Laboratory Director: Loleta Books, MD Fact  Sheet for Healthcare Providers  BankingDealers.co.za Fact Sheet for Patients  StrictlyIdeas.no    Coronavirus Source NASOPHARYNGEAL  Final    Comment: Performed at [facility], [place] [address], [place], [place] [address]  Respiratory Panel by PCR     Status: None   Collection Time: 03/01/19  5:20 AM  Result Value Ref Range Status   Adenovirus NOT DETECTED NOT DETECTED Final   Coronavirus 229E NOT DETECTED NOT DETECTED Final    Comment: (NOTE) The Coronavirus on the Respiratory Panel, DOES NOT test for the novel  Coronavirus (2019 nCoV)    Coronavirus HKU1 NOT DETECTED NOT DETECTED Final   Coronavirus NL63 NOT DETECTED NOT DETECTED Final   Coronavirus OC43 NOT DETECTED NOT DETECTED Final   Metapneumovirus NOT DETECTED NOT DETECTED Final   Rhinovirus / Enterovirus NOT DETECTED NOT DETECTED Final   Influenza A NOT DETECTED NOT DETECTED Final   Influenza B NOT DETECTED NOT DETECTED Final   Parainfluenza Virus 1 NOT DETECTED NOT DETECTED Final   Parainfluenza Virus 2 NOT DETECTED NOT DETECTED Final   Parainfluenza Virus 3 NOT DETECTED NOT DETECTED Final   Parainfluenza Virus 4 NOT DETECTED NOT DETECTED Final   Respiratory Syncytial Virus NOT DETECTED NOT DETECTED Final   Bordetella pertussis NOT DETECTED NOT DETECTED Final   Chlamydophila pneumoniae NOT DETECTED NOT DETECTED Final   Mycoplasma pneumoniae NOT DETECTED NOT DETECTED Final    Comment: Performed at [facility], [place]. [address] [place], [place] [address]  MRSA PCR Screening     Status: None   Collection Time: 03/02/19 11:11 AM  Result Value Ref Range Status   MRSA by PCR NEGATIVE NEGATIVE Final    Comment:        The GeneXpert MRSA Assay (FDA approved for NASAL specimens only), is one component of a comprehensive  MRSA colonization surveillance program. It is not intended to diagnose MRSA infection nor to guide or monitor treatment for MRSA infections. Performed at [facility], [place] [address], [place], [place] [address]          Radiology Studies: No results found.      Scheduled Meds: . aspirin  81 mg Oral BID PC  . azithromycin  500 mg Oral Daily  . carvedilol  6.25 mg Oral BID WC  . cefdinir  300 mg Oral Q12H  . cholecalciferol  2,000 Units Oral Daily  . docusate sodium  100 mg Oral BID  . insulin aspart  0-9 Units Subcutaneous TID WC  . magnesium oxide  200 mg Oral Q supper  . oxybutynin  5 mg Oral BID  . pantoprazole  40 mg Oral Daily  . pravastatin  40 mg Oral QHS  . primidone  50 mg Oral QHS  . tamsulosin  0.4 mg Oral QPC supper   Continuous Infusions:   LOS: 5 days    Time spent: 35 minutes.     Elmarie Shiley, MD [facility] Pager [phone]  If 7PM-7AM, please contact night-coverage [email or web address] Password [email or web address] 03/06/2019, 1:11 PM

## 2019-03-06 NOTE — Progress Notes (Signed)
Subjective: 2 Days Post-Op Procedure(s) (LRB): LEFT HIP HEMIARTHROPLASTY (Left) Patient reports pain as mild.  He is back to his baseline in terms of his mental status and dementia.  He is sitting in a chair.  Objective: Vital signs in last 24 hours: Temp:  [97.6 F (36.4 C)-98.7 F (37.1 C)] 97.9 F (36.6 C) (04/13 1347) Pulse Rate:  [89-104] 92 (04/13 1347) Resp:  [13-16] 16 (04/13 1347) BP: (100-120)/(57-72) 107/59 (04/13 1347) SpO2:  [90 %-96 %] 93 % (04/13 1347) Weight:  [81.7 kg] 81.7 kg (04/13 0949)  Intake/Output from previous day: 04/12 0701 - 04/13 0700 In: 480 [P.O.:480] Out: 200 [Urine:200] Intake/Output this shift: Total I/O In: 465 [P.O.:465] Out: 600 [Urine:600]  Recent Labs    03/04/19 0252 03/05/19 1130 03/06/19 0451  HGB 12.4* 11.6* 11.2*   Recent Labs    03/05/19 1130 03/06/19 0451  WBC 18.7* 13.2*  RBC 4.05* 3.88*  HCT 34.6* 33.1*  PLT 255 259   Recent Labs    03/05/19 1130 03/06/19 0451  NA 131* 133*  K 4.2 3.9  CL 97* 97*  CO2 20* 26  BUN 16 12  CREATININE 1.08 0.78  GLUCOSE 172* 124*  CALCIUM 8.4* 8.4*   No results for input(s): LABPT, INR in the last 72 hours.  Sensation intact distally I am able to move his hip around with much less discomfort on the left side.  His dressing has some scant bloody drainage.  Assessment/Plan: 2 Days Post-Op Procedure(s) (LRB): LEFT HIP HEMIARTHROPLASTY (Left) Up with therapy Discharge to SNF when this has been established by the primary medical team, therapy, social work and through communication with his family.      Mcarthur Rossetti 03/06/2019, 2:09 PM

## 2019-03-06 NOTE — Care Management Important Message (Signed)
Important Message  Patient Details  Name: Anthony [name] MRN: [id number] Date of Birth: May 20, 1934   Medicare Important Message Given:  Yes    [name] Montine Circle 03/06/2019, 4:18 PM

## 2019-03-06 NOTE — Progress Notes (Signed)
   03/06/19 1653  Vitals  Temp 98.1 F (36.7 C)  Temp Source Oral  BP 127/65  MAP (mmHg) 82  BP Location Left Arm  BP Method Automatic  Patient Position (if appropriate) Lying  Pulse Rate 86  Resp 18  Oxygen Therapy  SpO2 94 %  O2 Device Room Air  MEWS Score  MEWS RR 0  MEWS Pulse 0  MEWS Systolic 0  MEWS LOC 0  MEWS Temp 0  MEWS Score 0  MEWS Score Color Green  Dr. Tyrell Antonio notified of above VS and 10 beats of V-tach.  Ordered for Magnesium level  Will continue to monitor patient

## 2019-03-06 NOTE — NC FL2 (Signed)
[place] LEVEL OF CARE SCREENING TOOL     IDENTIFICATION  Patient Name: Anthony [name] Birthdate: 12-10-1933 Sex: male Admission Date (Current Location): 03/01/2019  [facility] and [place] Number:  Herbalist and Address:  [facility] [place], [place] [address], [place], [place] [address]      Provider Number: [id number]  Attending Physician Name and Address:  Elmarie Shiley, MD  Relative Name and Phone Number:  [name]; daughter; [phone]    Current Level of Care: Hospital Recommended Level of Care: [place] Prior Approval Number:    Date Approved/Denied:   PASRR Number: [id number]  Discharge Plan: SNF    Current Diagnoses: Patient Active Problem List   Diagnosis Date Noted  . Pressure injury of skin 03/05/2019  . Suspected Covid-19 Virus Infection 03/01/2019  . Acute respiratory failure with hypoxia ([place]) 03/01/2019  . Hip fracture ([place]) 03/01/2019  . Fall 03/01/2019  . Displaced fracture of base of neck of left femur, initial encounter for closed fracture ([place])   . Fever   . Nonspecific interstitial pneumonia ([place])   . Febrile illness   . Thrush   . Hypoxia   . Sepsis ([place]) 10/04/2018  . Fatty liver 04/06/2018  . Acute pancreatitis 04/04/2018  . Abdominal pain, right lower quadrant 05/28/2015  . Absolute anemia 05/28/2015  . Aortic atherosclerosis ([place]) 05/28/2015  . Altered blood in stool 05/28/2015  . Chronic systolic heart failure ([place]) 05/28/2015  . Syncope and collapse 05/28/2015  . Arteriosclerosis of coronary artery 05/28/2015  . Cough 05/28/2015  . Crohn's disease of large bowel ([place]) 05/28/2015  . D (diarrhea) 05/28/2015  . Displacement of cervical intervertebral disc without myelopathy 05/28/2015  . Benign essential HTN 05/28/2015  . Adenopathy 05/28/2015  . Hypercholesterolemia without hypertriglyceridemia 05/28/2015  . Hypo-osmolality and hyponatremia 05/28/2015  .  Postinflammatory pulmonary fibrosis ([place]) 05/28/2015  . Healed myocardial infarct 05/28/2015  . Peptic esophagitis 05/28/2015  . 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 Paroxysmal ventricular tachycardia ([place]) 05/28/2015  . Decreased body weight 05/28/2015  . Weakness 11/05/2014  . Nausea with vomiting 11/05/2014  . Generalized weakness   . Dehydration   . Difficulty walking   . Pulmonary infiltrates 07/16/2014  . Chronic combined systolic and diastolic heart failure ([place]) 05/31/2014  . Essential hypertension 05/31/2014  .  H/O Right inguinal hernia repair 01/15/2012  . Small fat containing right inguinal hernia that has been painful 08/05/2011  . Finger wound, simple, open 10/12/2008  . Benign prostatic hyperplasia without urinary obstruction 04/30/2008  . Synovitis and tenosynovitis 04/30/2008  . HLD (hyperlipidemia) 04/12/2008  . COLONIC POLYPS 11/21/2007  . MI 11/21/2007  . Coronary atherosclerosis 11/21/2007  . GERD 11/21/2007  . Rheumatoid arthritis ([place]) 11/21/2007  . SLEEP APNEA, MILD 11/21/2007    Orientation RESPIRATION BLADDER Height & Weight     Self, Place, Time  Normal Incontinent, External catheter Weight: 180 lb 1.9 oz (81.7 kg) Height:  5' 10"  (177.8 cm)  BEHAVIORAL SYMPTOMS/MOOD NEUROLOGICAL BOWEL NUTRITION STATUS      Continent Diet(regular diet; thin liquids)  AMBULATORY STATUS COMMUNICATION OF NEEDS Skin   Extensive Assist Verbally Surgical wounds, PU Stage and Appropriate Care, Other (Comment)(stage 2 PU on buttocks; MASD on buttocks and scrotum; surgical incision on left hip with silver hydrofiber)   PU Stage 2 Dressing: (buttocks with foam dressing)                   Personal Care Assistance Level of  Assistance  Bathing, Feeding, Dressing Bathing Assistance: Maximum assistance Feeding assistance: Limited assistance Dressing Assistance: Maximum assistance     Functional Limitations Info  Sight, Hearing, Speech Sight Info: Adequate Hearing Info:  Adequate Speech Info: Adequate    SPECIAL CARE FACTORS FREQUENCY  OT (By licensed OT), PT (By licensed PT)     PT Frequency: 5x week OT Frequency: 5x week            Contractures Contractures Info: Not present    Additional Factors Info  Code Status, Allergies, Insulin Sliding Scale Code Status Info: DNR Allergies Info: No Known Allergies   Insulin Sliding Scale Info: insulin aspart (novoLOG) injection 0-9 Units 3x daily with meals       Current Medications (03/06/2019):  This is the current hospital active medication list Current Facility-Administered Medications  Medication Dose Route Frequency Provider Last Rate Last Dose  . acetaminophen (TYLENOL) tablet 325-650 mg  325-650 mg Oral Q6H PRN Mcarthur Rossetti, MD      . acetaminophen (TYLENOL) tablet 650 mg  650 mg Oral Q6H PRN Mcarthur Rossetti, MD   650 mg at 03/03/19 0618  . aspirin chewable tablet 81 mg  81 mg Oral BID PC Mcarthur Rossetti, MD   81 mg at 03/06/19 7106  . azithromycin (ZITHROMAX) tablet 500 mg  500 mg Oral Daily Mcarthur Rossetti, MD   500 mg at 03/06/19 0851  . carvedilol (COREG) tablet 6.25 mg  6.25 mg Oral BID WC Regalado, Belkys A, MD   6.25 mg at 03/06/19 0859  . cefdinir (OMNICEF) capsule 300 mg  300 mg Oral Q12H Mcarthur Rossetti, MD   300 mg at 03/06/19 0848  . cholecalciferol (VITAMIN D3) tablet 2,000 Units  2,000 Units Oral Daily Mcarthur Rossetti, MD   2,000 Units at 03/06/19 [phone]  . docusate sodium (COLACE) capsule 100 mg  100 mg Oral BID Mcarthur Rossetti, MD   100 mg at 03/06/19 0851  . HYDROcodone-acetaminophen (NORCO) 7.5-325 MG per tablet 1-2 tablet  1-2 tablet Oral Q4H PRN Mcarthur Rossetti, MD      . HYDROcodone-acetaminophen (NORCO/VICODIN) 5-325 MG per tablet 1-2 tablet  1-2 tablet Oral Q4H PRN Mcarthur Rossetti, MD   2 tablet at 03/06/19 0957  . insulin aspart (novoLOG) injection 0-9 Units  0-9 Units Subcutaneous TID WC Mcarthur Rossetti, MD   1 Units at 03/06/19 [phone]  . LORazepam (ATIVAN) tablet 0.5 mg  0.5 mg Oral QHS PRN Mcarthur Rossetti, MD   0.5 mg at 03/03/19 2210  . magnesium oxide (MAG-OX) tablet 200 mg  200 mg Oral Q supper Mcarthur Rossetti, MD   200 mg at 03/05/19 1647  . menthol-cetylpyridinium (CEPACOL) lozenge 3 mg  1 lozenge Oral PRN Mcarthur Rossetti, MD       Or  . phenol (CHLORASEPTIC) mouth spray 1 spray  1 spray Mouth/Throat PRN Mcarthur Rossetti, MD      . metoCLOPramide (REGLAN) tablet 5-10 mg  5-10 mg Oral Q8H PRN Mcarthur Rossetti, MD       Or  . metoCLOPramide (REGLAN) injection 5-10 mg  5-10 mg Intravenous Q8H PRN Mcarthur Rossetti, MD      . metoprolol tartrate (LOPRESSOR) injection 2.5 mg  2.5 mg Intravenous Q6H PRN Regalado, Belkys A, MD      . morphine 2 MG/ML injection 0.5-1 mg  0.5-1 mg Intravenous Q2H PRN Mcarthur Rossetti, MD      . ondansetron (  ZOFRAN) tablet 4 mg  4 mg Oral Q6H PRN Mcarthur Rossetti, MD       Or  . ondansetron [facility]) injection 4 mg  4 mg Intravenous Q6H PRN Mcarthur Rossetti, MD      . oxybutynin (DITROPAN) tablet 5 mg  5 mg Oral BID Mcarthur Rossetti, MD   5 mg at 03/06/19 8727  . pantoprazole (PROTONIX) EC tablet 40 mg  40 mg Oral Daily Mcarthur Rossetti, MD   40 mg at 03/06/19 0849  . pravastatin (PRAVACHOL) tablet 40 mg  40 mg Oral QHS Mcarthur Rossetti, MD   40 mg at 03/05/19 2248  . primidone (MYSOLINE) tablet 50 mg  50 mg Oral QHS Mcarthur Rossetti, MD   50 mg at 03/05/19 2248  . tamsulosin (FLOMAX) capsule 0.4 mg  0.4 mg Oral QPC supper Mcarthur Rossetti, MD   0.4 mg at 03/05/19 1724     Discharge Medications: Please see discharge summary for a list of discharge medications.  Relevant Imaging Results:  Relevant Lab Results:   Additional Information [id number].   Alexander Mt, LCSWA

## 2019-03-07 LAB — CBC
HCT: 35.6 % — ABNORMAL LOW (ref 39.0–52.0)
Hemoglobin: 11.7 g/dL — ABNORMAL LOW (ref 13.0–17.0)
MCH: 28.5 pg (ref 26.0–34.0)
MCHC: 32.9 g/dL (ref 30.0–36.0)
MCV: 86.8 fL (ref 80.0–100.0)
Platelets: 339 10*3/uL (ref 150–400)
RBC: 4.1 MIL/uL — ABNORMAL LOW (ref 4.22–5.81)
RDW: 14.7 % (ref 11.5–15.5)
WBC: 12.2 10*3/uL — ABNORMAL HIGH (ref 4.0–10.5)
nRBC: 0 % (ref 0.0–0.2)

## 2019-03-07 LAB — GLUCOSE, CAPILLARY
Glucose-Capillary: 98 mg/dL (ref 70–99)
Glucose-Capillary: 137 mg/dL — ABNORMAL HIGH (ref 70–99)
Glucose-Capillary: 141 mg/dL — ABNORMAL HIGH (ref 70–99)
Glucose-Capillary: 111 mg/dL — ABNORMAL HIGH (ref 70–99)

## 2019-03-07 MED ORDER — PREDNISONE 10 MG PO TABS
10.0000 mg | ORAL_TABLET | Freq: Every day | ORAL | Status: DC
  Administered 2019-03-07 – 2019-03-08 (×2): 10 mg via ORAL
  Filled 2019-03-07 (×2): qty 1

## 2019-03-07 MED ORDER — SODIUM CHLORIDE 0.9 % IV SOLN
INTRAVENOUS | Status: DC
  Administered 2019-03-07 – 2019-03-08 (×2): via INTRAVENOUS

## 2019-03-07 MED ORDER — HYDROCODONE-ACETAMINOPHEN 5-325 MG PO TABS: 1.0000 | ORAL_TABLET | ORAL | 0 refills | Status: DC | PRN

## 2019-03-07 MED ORDER — TRAMADOL HCL 50 MG PO TABS: 50.0000 mg | ORAL_TABLET | Freq: Four times a day (QID) | ORAL | 0 refills | Status: DC | PRN

## 2019-03-07 MED ORDER — ASPIRIN 81 MG PO CHEW: 81.0000 mg | CHEWABLE_TABLET | Freq: Two times a day (BID) | ORAL | 0 refills | Status: DC

## 2019-03-07 MED ORDER — CEFDINIR 300 MG PO CAPS: 300.0000 mg | ORAL_CAPSULE | Freq: Two times a day (BID) | ORAL | 0 refills | Status: DC

## 2019-03-07 MED ORDER — DOCUSATE SODIUM 100 MG PO CAPS: 100.0000 mg | ORAL_CAPSULE | Freq: Two times a day (BID) | ORAL | 0 refills | Status: AC

## 2019-03-07 MED ORDER — SACCHAROMYCES BOULARDII 250 MG PO CAPS
250.0000 mg | ORAL_CAPSULE | Freq: Two times a day (BID) | ORAL | Status: DC
  Administered 2019-03-07 – 2019-03-08 (×3): 250 mg via ORAL
  Filled 2019-03-07 (×3): qty 1

## 2019-03-07 NOTE — Progress Notes (Signed)
Occupational Therapy Treatment Patient Details Name: Anthony [name] MRN: [id number] DOB: [birth year] Today's Date: 03/07/2019    History of present illness 85 yo male s/p fall resulting in partial L hip hemiarthroplasty. PMHx: RA, Crohn's dx, IDDM, HTN, HLD, CHF, CAD heart failure, sleep apnea   OT comments  Pt with incontinence of bowel and loose stool at this time with redness breakdown on buttock. RN called to room to observe and address. Pt unaware of incontinence. Pt will require hoyer lift for OOB transfers at this time with need to have frequent change of position.    Follow Up Recommendations  SNF;Supervision/Assistance - 24 hour    Equipment Recommendations  Wheelchair (measurements OT);Wheelchair cushion (measurements OT);Hospital bed;3 in 1 bedside commode;Other (comment)(lift)    Recommendations for Other Services      Precautions / Restrictions Precautions Precautions: Fall Restrictions Weight Bearing Restrictions: Yes LLE Weight Bearing: Weight bearing as tolerated       Mobility Bed Mobility Overal bed mobility: Needs Assistance Bed Mobility: Supine to Sit;Sit to Supine     Supine to sit: Max assist Sit to supine: +2 for physical assistance;Total assist   General bed mobility comments: maxAx2 for coming EoB, pt able to follow vc for hand placement on bedrail to assist in pivoting hips, therapists utilized bed pad to helicopter hips to EoB and then bring trunk to upright, pt requires total assist to return to bed due to fatigue sitting EoB  Transfers Overall transfer level: Needs assistance   Transfers: Sit to/from Stand Sit to Stand: Max assist;+2 physical assistance;From elevated surface         General transfer comment: attempted to stand x3 to [facility] EoB, pt with increased posterior lean vc for brinking trunk over CoG, unable to come fully to standing and on last attempt had bowel movement    Balance Overall balance assessment: Needs  assistance Sitting-balance support: Bilateral upper extremity supported Sitting balance-Leahy Scale: Zero Sitting balance - Comments: able to progres from max support to min suport and with vc for hands on knees able to sit without assist for small bouts of time Postural control: Posterior lean                                 ADL either performed or assessed with clinical judgement   ADL Overall ADL's : Needs assistance/impaired                                       General ADL Comments: (A) to EOB and incontinence of bowel with lack of awareness. pt total (A) for peri care. pt with skin break down noted and RN notified.      Vision       Perception     Praxis      Cognition Arousal/Alertness: Awake/alert(falls asleep some during therex) Behavior During Therapy: Flat affect Overall Cognitive Status: No family/caregiver present to determine baseline cognitive functioning                                 General Comments: decreased knowledge of situation, however able to speak with authority on fishing        Exercises     Shoulder Instructions       General Comments pt noted to have  break down on buttocks     Pertinent Vitals/ Pain       Pain Assessment: 0-10 Faces Pain Scale: Hurts little more Pain Location: LLE, with movement, relaxed at rest Pain Descriptors / Indicators: Guarding;Grimacing;Operative site guarding Pain Intervention(s): Limited activity within patient's tolerance;Monitored during session;Premedicated before session;Repositioned  Home Living                                          Prior Functioning/Environment              Frequency  Min 3X/week        Progress Toward Goals  OT Goals(current goals can now be found in the care plan section)  Progress towards OT goals: Progressing toward goals  Acute Rehab OT Goals Patient Stated Goal: to go fishing OT Goal Formulation:  With patient Time For Goal Achievement: 03/19/19 Potential to Achieve Goals: Fair ADL Goals Pt Will Perform Grooming: with set-up;sitting Pt Will Transfer to Toilet: with mod assist;stand pivot transfer;bedside commode Additional ADL Goal #1: pt will stand supported x4 mins of static standing in prep for ADL  Plan Discharge plan remains appropriate    Co-evaluation    PT/OT/SLP Co-Evaluation/Treatment: Yes Reason for Co-Treatment: Complexity of the patient's impairments (multi-system involvement);Necessary to address cognition/behavior during functional activity;For patient/therapist safety;To address functional/ADL transfers   OT goals addressed during session: ADL's and self-care;Proper use of Adaptive equipment and DME;Strengthening/ROM      AM-PAC OT "6 Clicks" Daily Activity     Outcome Measure   Help from another person eating meals?: A Little Help from another person taking care of personal grooming?: A Little Help from another person toileting, which includes using toliet, bedpan, or urinal?: Total Help from another person bathing (including washing, rinsing, drying)?: A Lot Help from another person to put on and taking off regular upper body clothing?: A Little Help from another person to put on and taking off regular lower body clothing?: Total 6 Click Score: 13    End of Session Equipment Utilized During Treatment: Gait belt;Other (comment)(stedy)  OT Visit Diagnosis: Unsteadiness on feet (R26.81);Muscle weakness (generalized) (M62.81);Pain Pain - Right/Left: Left Pain - part of body: Leg   Activity Tolerance Patient tolerated treatment well   Patient Left in bed;with call bell/phone within reach;with bed alarm set   Nurse Communication Mobility status;Precautions        Time: 3668-1594 OT Time Calculation (min): 40 min  Charges: OT General Charges $OT Visit: 1 Visit OT Treatments $Self Care/Home Management : 8-22 mins   Jeri Modena, OTR/L  Acute  Rehabilitation Services Pager: [phone] Office: [phone] .    Jeri Modena 03/07/2019, 3:49 PM

## 2019-03-07 NOTE — Discharge Instructions (Signed)

## 2019-03-07 NOTE — Progress Notes (Signed)
Physical Therapy Treatment Patient Details Name: Anthony [name] MRN: [id number] DOB: [birth year] Today's Date: 03/07/2019    History of Present Illness Pt is a 85 yo male s/p fall resulting in partial L hip hemiarthroplasty. PMHx: RA, Crohn's dx, IDDM, HTN, HLD, CHF, CAD heart failure, sleep apnea    PT Comments    Pt agreeable to working with therapy. Pt able to assist with coming to EoB by reaching to bed rails and pushing through UE to bring trunk to upright, continues to need maxAx2 to achieve. Attempted to stand to the [facility] with maxAx2 however unable to extend knees to come to upright, maximal verbal cues for sequencing of power up on third attempt pt had bowel movement and needed to return to bed. Pt sat EoB for five minutes with varying levels of support while preparing for return to bed for pericare. RN notified about raw peri-area and requested barrier cream. Pt is motivated to participate in therapy, however is very weak and today was limited by need to return to bed. PT continues to recommend SNF level rehab at discharge to improve strength for transfers before ultimate d/c home. PT will continue to follow acutely.     Follow Up Recommendations  SNF     Equipment Recommendations  None recommended by PT    Recommendations for Other Services       Precautions / Restrictions Precautions Precautions: Fall Restrictions Weight Bearing Restrictions: Yes LLE Weight Bearing: Weight bearing as tolerated    Mobility  Bed Mobility Overal bed mobility: Needs Assistance Bed Mobility: Supine to Sit;Sit to Supine     Supine to sit: Max assist Sit to supine: +2 for physical assistance;Total assist   General bed mobility comments: maxAx2 for coming EoB, pt able to follow vc for hand placement on bedrail to assist in pivoting hips, therapists utilized bed pad to helicopter hips to EoB and then bring trunk to upright, pt requires total assist to return to bed due to fatigue sitting  EoB  Transfers Overall transfer level: Needs assistance   Transfers: Sit to/from Stand Sit to Stand: Max assist;+2 physical assistance;From elevated surface         General transfer comment: attempted to stand x3 to [facility] EoB, pt with increased posterior lean vc for brinking trunk over CoG, unable to come fully to standing and on last attempt had bowel movement     Balance Overall balance assessment: Needs assistance Sitting-balance support: Bilateral upper extremity supported Sitting balance-Leahy Scale: Zero Sitting balance - Comments: able to progres from max support to min suport and with vc for hands on knees able to sit without assist for small bouts of time Postural control: Posterior lean                                  Cognition Arousal/Alertness: Awake/alert(falls asleep some during therex) Behavior During Therapy: Flat affect Overall Cognitive Status: No family/caregiver present to determine baseline cognitive functioning                                 General Comments: decreased knowledge of situation, however able to speak with authority on fishing      Exercises      General Comments General comments (skin integrity, edema, etc.): Pt with copious amounts of stool, during pericare pt noted to be very raw between his legs,  with what looks to be a 10x7cm Stage 2 pressure injury to the sacrum, periwoud area is purplish and may well advance the borders of the injury      Pertinent Vitals/Pain Pain Assessment: 0-10 Faces Pain Scale: Hurts little more Pain Location: LLE, with movement, relaxed at rest Pain Descriptors / Indicators: Guarding;Grimacing;Operative site guarding Pain Intervention(s): Limited activity within patient's tolerance;Monitored during session;Repositioned           PT Goals (current goals can now be found in the care plan section) Acute Rehab PT Goals PT Goal Formulation: With patient Time For Goal  Achievement: 03/19/19 Potential to Achieve Goals: Fair Progress towards PT goals: Progressing toward goals    Frequency    Min 2X/week      PT Plan Current plan remains appropriate;Frequency needs to be updated    Co-evaluation   Reason for Co-Treatment: Complexity of the patient's impairments (multi-system involvement);To address functional/ADL transfers PT goals addressed during session: Mobility/safety with mobility        AM-PAC PT "6 Clicks" Mobility   Outcome Measure  Help needed turning from your back to your side while in a flat bed without using bedrails?: Total Help needed moving from lying on your back to sitting on the side of a flat bed without using bedrails?: A Lot Help needed moving to and from a bed to a chair (including a wheelchair)?: Total Help needed standing up from a chair using your arms (e.g., wheelchair or bedside chair)?: Total Help needed to walk in hospital room?: Total Help needed climbing 3-5 steps with a railing? : Total 6 Click Score: 7    End of Session Equipment Utilized During Treatment: Gait belt Activity Tolerance: Patient limited by fatigue;Other (comment)(bowel movement) Patient left: with call bell/phone within reach;in bed;with bed alarm set Nurse Communication: Mobility status;Need for lift equipment PT Visit Diagnosis: Other abnormalities of gait and mobility (R26.89);Muscle weakness (generalized) (M62.81);Pain Pain - Right/Left: Left Pain - part of body: Hip     Time: 2010-0712 PT Time Calculation (min) (ACUTE ONLY): 38 min  Charges:  $Therapeutic Activity: 23-37 mins                     Almir Botts B. Migdalia Dk PT, DPT Acute Rehabilitation Services Pager [phone] Office [phone]    [place] 03/07/2019, 12:26 PM

## 2019-03-07 NOTE — Progress Notes (Signed)
Patient ID: Anthony [name], male   DOB: 1934/11/07, 85 y.o.   MRN: [id number] Doing very well overall.  Definitely needs skilled nursing placement.  I changed his left hip dressing and it looks good.  Can be discharged from an Ortho standpoint.

## 2019-03-07 NOTE — TOC Transition Note (Addendum)
Transition of Care [facility]) - CM/SW Discharge Note   Patient Details  Name: Anthony [name] MRN: [id number] Date of Birth: 1934-03-15  Transition of Care [facility]) CM/SW Contact:  Alexander Mt, [place] Phone Number: 03/07/2019, 2:06 PM   Clinical Narrative:    Pt approved for 7 days of SNF placement, [phone]. Called pt daughter, no answer - HIPAA compliant message left, per MD pt will d/c tomorrow. SNF aware and has all needed clinicals. DNR, scripts, PTAR papers on chart.   Final next level of care: [place] Barriers to Discharge: Barriers Resolved   Patient Goals and CMS Choice Patient states their goals for this hospitalization and ongoing recovery are:: to go back to [email or web address].gov Compare Post Acute Care list provided to:: Patient Represenative (must comment) Choice offered to / list presented to : Adult Children(daughter [name])  Discharge Placement   Existing PASRR number confirmed : 03/06/19          Patient chooses bed at: [facility] Patient to be transferred to facility by: [place] Name of family member notified: pt daughter Patient and family notified of of transfer: 03/07/19  Discharge Plan and Services   Discharge Planning Services: NA Post Acute Care Choice: [place]          DME Arranged: N/A DME Agency: NA HH Arranged: NA HH Agency: NA   Social Determinants of Health ([place]) Interventions     Readmission Risk Interventions Readmission Risk Prevention Plan 03/06/2019  Transportation Screening Complete  Medication Review Press photographer) Complete  PCP or Specialist appointment within 3-5 days of discharge Complete  HRI or Home Care Consult Complete  SW Recovery Care/Counseling Consult Complete  [place] Complete  Some recent data might be hidden

## 2019-03-07 NOTE — Discharge Summary (Addendum)
Physician Discharge Summary  Anthony [name] OMB:[id number] DOB: 02/20/1934 DOA: 03/01/2019  PCP: No primary care provider on file.  Admit date: 03/01/2019 Discharge date: 03/07/2019  Admitted From: SNF  Disposition: SNF  Recommendations for Outpatient Follow-up:  1. Follow up with PCP in 1-2 weeks 2. Please obtain BMP/CBC in one week 3. Needs to follow up with Dr Ninfa Linden for post operative care     Discharge Condition: stable.  CODE STATUS:DNR Diet recommendation: Heart Healthy   Brief/Interim Summary: 85 year old with past medical history significant for rheumatoid arthritis, coronary artery disease, Crohn's disease, IDDM, hypertension chronic systolic heart failure admitted on 03/01/2019 presented with complaint of fall was found to have left hip fracture.  Patient had an episode of fever on 4/6, he was noted to be hypoxic as well.  CT scan does not show typical classic groundglass opacity like ARDS picture.  He was tested for COVID-19 which was negative.  He was a low pretest probability. He underwent left hip bipolar hemiarthroplasty on 4-11.  Addendum;   Will cancel discharge for today. Hypotension.   Patient slightly orthostatic, and had two large watery BM. Discontinue laxatives.  Observed overnight. Will give IV fluids.  I have resume his chronic prednisone as well.    1-Left femoral neck fracture: Unwitnessed fall on 02/27/2019. Status post left hip bipolar hemiarthroplasty on 4/11 2020. DVT prophylaxis per Ortho. Per daughter patient was able to stand up and walk and get in his wheelchair prior to this admission. The day of admission he went to bathroom and felt. Per daughter patient was getting better at facility prior to this admission, he was starting to ambulate.    2-COVID with PCR negative Patient had an episode of fever, hypoxemia, CT chest did not show evidence of ARDS. Droplet contact precaution was discontinued.  3-Health care Associated pneumonia: Acute  hypoxic respiratory failure.  Patient with Sirs criteria. CT PE protocol was negative for PE.  Possible atelectasis on initial CT scan.  Small groundglass nodule in the left lower lobe. Patient was a started on IV vancomycin, Zosyn.  Now on cefdinir. He finished 5 days of azithromycin.  WBC trending down.  Discharge on 2 days of cefdinir.   4-Hyponatremia; initially sodium at 128.  Stable.   5-Dementia: No behavioral disturbance. Alert, conversant.   6-Chronic systolic heart failure. Resume lasix at discharge. Start lasix 4-15  Overreactive bladder; continue with Ditropan rheumatoid arthritis: Not currently on medication for this.  Leukocytosis: His respiratory symptom has improved. He is on oral antibiotics.  WBC trending down.   Tachycardia;  Increase coreg to home dose. IV bolus.  Stable this am.   Acute blood loss anemia mild: Post surgery.  Monitor hemoglobin.  Pressure injury stage II buttocks Local care.  Chronic medication; he is on prednisone. Will resume home dose.    Discharge Diagnoses:  Principal Problem:   Sepsis ([place]) Active Problems:   Suspected Covid-19 Virus Infection was rule out. Negative test.    Acute respiratory failure with hypoxia ([facility])   Hip fracture ([place])   Fall   Displaced fracture of base of neck of left femur, initial encounter for closed fracture ([place])   Pressure injury of skin    Discharge Instructions  Discharge Instructions    Diet - low sodium heart healthy   Complete by:  As directed    Increase activity slowly   Complete by:  As directed      Allergies as of 03/07/2019   No Known Allergies  Medication List    STOP taking these medications   levofloxacin 750 MG tablet Commonly known as:  LEVAQUIN   traMADol 50 MG tablet Commonly known as:  ULTRAM     TAKE these medications   acetaminophen 325 MG tablet Commonly known as:  TYLENOL Take 650 mg by mouth every 6 (six) hours as needed for mild pain or  fever.   aspirin 81 MG chewable tablet Chew 1 tablet (81 mg total) by mouth 2 (two) times daily after a meal.   B-12 500 MCG Tabs Take 500 mcg by mouth every evening.   carvedilol 6.25 MG tablet Commonly known as:  COREG TAKE 1 TABLET TWICE A DAY WITH A MEAL.   cefdinir 300 MG capsule Commonly known as:  OMNICEF Take 1 capsule (300 mg total) by mouth every 12 (twelve) hours.   docusate sodium 100 MG capsule Commonly known as:  COLACE Take 1 capsule (100 mg total) by mouth 2 (two) times daily.   feeding supplement (ENSURE ENLIVE) Liqd Take 237 mLs by mouth 2 (two) times daily between meals.   furosemide 40 MG tablet Commonly known as:  LASIX Take 40 mg by mouth daily.   guaiFENesin-dextromethorphan 100-10 MG/5ML syrup Commonly known as:  ROBITUSSIN DM Take 5 mLs by mouth every 4 (four) hours as needed for cough.   HYDROcodone-acetaminophen 5-325 MG tablet Commonly known as:  NORCO/VICODIN Take 1-2 tablets by mouth every 4 (four) hours as needed for moderate pain (pain score 4-6).   insulin aspart 100 UNIT/ML injection Commonly known as:  novoLOG Inject 0-12 Units into the skin 3 (three) times daily with meals. Sliding scale   LORazepam 0.5 MG tablet Commonly known as:  ATIVAN Take 0.5 mg by mouth every evening.   magnesium oxide 400 MG tablet Commonly known as:  MAG-OX Take 200 mg by mouth daily.   nitroGLYCERIN 0.4 MG SL tablet Commonly known as:  NITROSTAT Place 0.4 mg under the tongue every 5 (five) minutes as needed for chest pain.   nystatin powder Commonly known as:  MYCOSTATIN/NYSTOP Apply topically 3 (three) times daily. To red and yeasty groin area   omeprazole 40 MG capsule Commonly known as:  PRILOSEC Take 40 mg by mouth at bedtime.   oxybutynin 5 MG tablet Commonly known as:  DITROPAN Take 5 mg by mouth 2 (two) times daily.   pravastatin 40 MG tablet Commonly known as:  PRAVACHOL Take 1 tablet (40 mg total) by mouth at bedtime.    predniSONE 10 MG tablet Commonly known as:  DELTASONE Take 10 mg by mouth daily with breakfast.   primidone 50 MG tablet Commonly known as:  MYSOLINE Take 50 mg by mouth at bedtime.   tamsulosin 0.4 MG Caps capsule Commonly known as:  FLOMAX Take 0.4 mg by mouth daily after supper.   vitamin A 10000 UNIT capsule Take 10,000 Units by mouth daily.   Vitamin D3 50 MCG (2000 UT) capsule Take 2,000 Units by mouth daily.   vitamin E 400 UNIT capsule Take 400 Units by mouth daily.   zinc oxide 20 % ointment Apply 1 application topically 3 (three) times daily. To bilateral sheared areas on buttocks cheeks      Contact information for after-discharge care    Destination    [facility] .   Service:  Skilled Nursing Contact information: [address] [place] [phone]  No Known Allergies  Consultations:  Dr Ninfa Linden   Procedures/Studies: Ct Head Wo Contrast  Result Date: 03/01/2019 CLINICAL DATA:  Headache, post traumatic; C-spine fx, traumatic. Possible fall. EXAM: CT HEAD WITHOUT CONTRAST CT CERVICAL SPINE WITHOUT CONTRAST TECHNIQUE: Multidetector CT imaging of the head and cervical spine was performed following the standard protocol without intravenous contrast. Multiplanar CT image reconstructions of the cervical spine were also generated. COMPARISON:  Head CT 11/05/2014 cervical spine MRI 05/29/2011 FINDINGS: CT HEAD FINDINGS Brain: Generalized atrophy. Ventriculomegaly is likely secondary to central atrophy and slightly progressed from 2015 CT. Progressive chronic small vessel ischemia. No intracranial hemorrhage. No evidence of acute infarct. No midline shift or mass effect. No subdural or extra-axial fluid collection. Vascular: Atherosclerosis of skullbase vasculature without hyperdense vessel or abnormal calcification. Skull: No fracture or focal lesion. Sinuses/Orbits: Paranasal  sinuses and mastoid air cells are clear. The visualized orbits are unremarkable. Other: None. CT CERVICAL SPINE FINDINGS Alignment: Straightening of normal lordosis, mild leftward scoliotic curvature. Minimal anterolisthesis of C3 on C4 and C6 on C7 is likely degenerative and facet mediated. No traumatic subluxation. Skull base and vertebrae: No acute fracture. Dens is intact. Bony fragmentation and erosive change at the atlantodental interval is chronic and likely related to inflammatory arthropathy. Underlying heterogeneous bony mineralization suggest osteopenia. Soft tissues and spinal canal: No prevertebral fluid or swelling. No visible canal hematoma. Heterogeneous thyroid gland with enlargement of the left lobe, no well-defined nodule, similar appearance to 07/06/2014 chest CT. Disc levels: Near complete disc space loss at C5-C6, unchanged from prior. Disc space narrowing and endplate spurring at additional levels, most prominent C6-C7. Multilevel facet hypertrophy. Upper chest: No acute findings. Other: None. IMPRESSION: 1. No acute intracranial abnormality. No skull fracture. 2. Generalized atrophy and chronic small vessel ischemia, mildly progressed from 2015. 3. Multilevel degenerative a chronic change in the cervical spine without acute fracture or subluxation. Electronically Signed   By: Keith Rake M.D.   On: 03/01/2019 02:45   Ct Angio Chest Pe W And/or Wo Contrast  Result Date: 03/01/2019 CLINICAL DATA:  Femur fracture and fever. EXAM: CT ANGIOGRAPHY CHEST WITH CONTRAST TECHNIQUE: Multidetector CT imaging of the chest was performed using the standard protocol during bolus administration of intravenous contrast. Multiplanar CT image reconstructions and MIPs were obtained to evaluate the vascular anatomy. CONTRAST:  59m ISOVUE-370 IOPAMIDOL (ISOVUE-370) INJECTION 76% COMPARISON:  10/07/2018 FINDINGS: Cardiovascular: Satisfactory opacification of the pulmonary arteries to the segmental level. No  evidence of pulmonary embolism. Normal heart size. No pericardial effusion. Atherosclerotic calcification of the aorta and coronaries. Mediastinum/Nodes: Negative for adenopathy. Nodular thyroid gland, especially on the left, stable. Lungs/Pleura: Mild increased density of the lungs likely related to expiratory phase imaging with atelectasis. Small discrete ground-glass opacity in the subpleural left upper lobe. Central airways are clear. No edema or effusion. Upper Abdomen: No acute finding Musculoskeletal: Remote T5 superior endplate fracture Review of the MIP images confirms the above findings. IMPRESSION: 1. Negative for pulmonary embolism. 2. Increased pulmonary density is likely from atelectasis on this expiratory phase scan. Small ground-glass nodule in the left, cannot exclude early and infectious or inflammatory process in the setting of fever. Electronically Signed   By: JMonte FantasiaM.D.   On: 03/01/2019 04:58   Ct Cervical Spine Wo Contrast  Result Date: 03/01/2019 CLINICAL DATA:  Headache, post traumatic; C-spine fx, traumatic. Possible fall. EXAM: CT HEAD WITHOUT CONTRAST CT CERVICAL SPINE WITHOUT CONTRAST TECHNIQUE: Multidetector CT imaging of the head and cervical spine was  performed following the standard protocol without intravenous contrast. Multiplanar CT image reconstructions of the cervical spine were also generated. COMPARISON:  Head CT 11/05/2014 cervical spine MRI 05/29/2011 FINDINGS: CT HEAD FINDINGS Brain: Generalized atrophy. Ventriculomegaly is likely secondary to central atrophy and slightly progressed from 2015 CT. Progressive chronic small vessel ischemia. No intracranial hemorrhage. No evidence of acute infarct. No midline shift or mass effect. No subdural or extra-axial fluid collection. Vascular: Atherosclerosis of skullbase vasculature without hyperdense vessel or abnormal calcification. Skull: No fracture or focal lesion. Sinuses/Orbits: Paranasal sinuses and mastoid air  cells are clear. The visualized orbits are unremarkable. Other: None. CT CERVICAL SPINE FINDINGS Alignment: Straightening of normal lordosis, mild leftward scoliotic curvature. Minimal anterolisthesis of C3 on C4 and C6 on C7 is likely degenerative and facet mediated. No traumatic subluxation. Skull base and vertebrae: No acute fracture. Dens is intact. Bony fragmentation and erosive change at the atlantodental interval is chronic and likely related to inflammatory arthropathy. Underlying heterogeneous bony mineralization suggest osteopenia. Soft tissues and spinal canal: No prevertebral fluid or swelling. No visible canal hematoma. Heterogeneous thyroid gland with enlargement of the left lobe, no well-defined nodule, similar appearance to 07/06/2014 chest CT. Disc levels: Near complete disc space loss at C5-C6, unchanged from prior. Disc space narrowing and endplate spurring at additional levels, most prominent C6-C7. Multilevel facet hypertrophy. Upper chest: No acute findings. Other: None. IMPRESSION: 1. No acute intracranial abnormality. No skull fracture. 2. Generalized atrophy and chronic small vessel ischemia, mildly progressed from 2015. 3. Multilevel degenerative a chronic change in the cervical spine without acute fracture or subluxation. Electronically Signed   By: Keith Rake M.D.   On: 03/01/2019 02:45   Pelvis Portable  Result Date: 03/04/2019 CLINICAL DATA:  Post left hip arthroplasty. EXAM: PORTABLE PELVIS 1-2 VIEWS COMPARISON:  Preoperative radiographs 02/28/2010 FINDINGS: Left hip arthroplasty in expected alignment. No periprosthetic lucency or fracture. Recent postsurgical change includes air and edema in the soft tissues and skin staples. IMPRESSION: Left hip arthroplasty without immediate postoperative complication. Electronically Signed   By: Keith Rake M.D.   On: 03/04/2019 19:12   Dg Chest Portable 1 View  Result Date: 03/01/2019 CLINICAL DATA:  Initial evaluation for acute  hypoxia, low grade fever. EXAM: PORTABLE CHEST 1 VIEW COMPARISON:  Prior radiograph from 01/25/2019. FINDINGS: Transverse heart size at the upper limits of normal. Mediastinal silhouette within normal limits. Aortic atherosclerosis. Lungs hypoinflated. Mild linear left basilar atelectasis and/or scarring. No focal infiltrates. No edema or definite pleural effusion. No pneumothorax. No acute osseous finding. Osteopenia noted. IMPRESSION: 1. Shallow lung inflation with mild left basilar atelectasis and/or scarring. No other active cardiopulmonary disease identified. 2. Aortic atherosclerosis. Electronically Signed   By: Jeannine Boga M.D.   On: 03/01/2019 02:24   Dg C-arm 1-60 Min  Result Date: 03/04/2019 CLINICAL DATA:  Hip fracture. EXAM: DG C-ARM 61-120 MIN; OPERATIVE LEFT HIP WITH PELVIS COMPARISON:  03/01/2019 FINDINGS: Left hip hemiarthroplasty for fracture. Normal alignment. No acute complication. IMPRESSION: Satisfactory left hip hemiarthroplasty for fracture. Electronically Signed   By: Franchot Gallo M.D.   On: 03/04/2019 11:13   Dg Hip Operative Unilat W Or W/o Pelvis Left  Result Date: 03/04/2019 CLINICAL DATA:  Hip fracture. EXAM: DG C-ARM 61-120 MIN; OPERATIVE LEFT HIP WITH PELVIS COMPARISON:  03/01/2019 FINDINGS: Left hip hemiarthroplasty for fracture. Normal alignment. No acute complication. IMPRESSION: Satisfactory left hip hemiarthroplasty for fracture. Electronically Signed   By: Franchot Gallo M.D.   On: 03/04/2019 11:13  Dg Hip Unilat With Pelvis 2-3 Views Left  Result Date: 03/01/2019 CLINICAL DATA:  Initial evaluation for acute trauma, fall. EXAM: DG HIP (WITH OR WITHOUT PELVIS) 2-3V LEFT COMPARISON:  None. FINDINGS: Acute subcapital fracture of the left femoral neck with superior subluxation. Femoral head remains normally position within the acetabulum. Femoral head height maintained. Bony pelvis intact. Limited views of the right hip demonstrate no acute finding. Underlying  osteoarthritic changes noted about the hips bilaterally, left greater than right. No soft tissue abnormality. IMPRESSION: Acute subcapital fracture of the left femoral neck with superior subluxation. Electronically Signed   By: Jeannine Boga M.D.   On: 03/01/2019 02:25    Subjective: He is feeling well, no cough, no dyspnea.   Discharge Exam: Vitals:   03/07/19 0058 03/07/19 0526  BP: 107/66 117/68  Pulse: 93 85  Resp: 18 18  Temp: 97.9 F (36.6 C) 98 F (36.7 C)  SpO2: 92% 97%     General: Pt is alert, awake, not in acute distress Cardiovascular: RRR, S1/S2 +, no rubs, no gallops Respiratory: CTA bilaterally, no wheezing, no rhonchi Abdominal: Soft, NT, ND, bowel sounds + Extremities: no edema, no cyanosis, left hip with dressing old blood.     The results of significant diagnostics from this hospitalization (including imaging, microbiology, ancillary and laboratory) are listed below for reference.     Microbiology: Recent Results (from the past 240 hour(s))  Blood culture (routine x 2)     Status: None   Collection Time: 03/01/19  3:37 AM  Result Value Ref Range Status   Specimen Description BLOOD BLOOD RIGHT FOREARM  Final   Special Requests   Final    BOTTLES DRAWN AEROBIC AND ANAEROBIC Blood Culture adequate volume   Culture   Final    NO GROWTH 5 DAYS Performed at [facility], [address] [place], [place] [address]    Report Status 03/06/2019 FINAL  Final  Blood culture (routine x 2)     Status: None   Collection Time: 03/01/19  3:43 AM  Result Value Ref Range Status   Specimen Description BLOOD LEFT WRIST  Final   Special Requests   Final    BOTTLES DRAWN AEROBIC ONLY Blood Culture results may not be optimal due to an excessive volume of blood received in culture bottles   Culture   Final    NO GROWTH 5 DAYS Performed at [facility], [place] [address] [place],  [address]    Report Status 03/06/2019 FINAL  Final  Novel  Coronavirus, NAA (hospital order; send-out to ref lab)     Status: None   Collection Time: 03/01/19  5:20 AM  Result Value Ref Range Status   SARS-CoV-2, NAA NOT DETECTED NOT DETECTED Final    Comment: Negative (Not Detected) results do not exclude infection caused by SARS CoV 2 and should not be used as the sole basis for treatment or other patient management decisions. Optimum specimen types and timing for peak viral levels during infections caused  by SARS CoV 2 have not been determined. Collection of multiple specimens (types and time points) from the same patient may be necessary to detect the virus. Improper specimen collection and handling, sequence variability underlying assay primers and or probes, or the presence of organisms in  quantities less than the limit of detection of the assay may lead to false negative results. Positive and negative predictive values of testing are highly dependent on prevalence. False negative results are  more likely when prevalence of disease is high. (NOTE) The expected result is Negative (Not Detected). The SARS CoV 2 test is intended for the presumptive qualitative  detection of nucleic acid from SARS CoV 2 in upper and lower  respir atory specimens. Testing methodology is real time RT PCR. Test results must be correlated with clinical presentation and  evaluated in the context of other laboratory and epidemiologic data.  Test performance can be affected because the epidemiology and  clinical spectrum of infection caused by SARS CoV 2 is not fully  known. For example, the optimum types of specimens to collect and  when during the course of infection these specimens are most likely  to contain detectable viral RNA may not be known. This test has not been Food and Drug Administration (FDA) cleared or  approved and has been authorized by FDA under an Emergency Use  Authorization (EUA). The test is only authorized for the duration of  the declaration  that circumstances exist justifying the authorization  of emergency use of in vitro diagnostic tests for detection and or  diagnosis of SARS CoV 2 under Section 564(b)(1) of the Act, 21 U.S.C.  section [phone] 3(b)(1), unless the authorization is terminated or   revoked sooner. [facility] is certified under the  Clinical Laboratory Improvement Amendments of 1988 (CLIA), 42 U.S.C.  section [id number], to perform high complexity tests. Performed at [facility] [id number], [place], [place] [address] Laboratory Director: Loleta Books, MD Fact Sheet for Healthcare Providers  BankingDealers.co.za Fact Sheet for Patients  StrictlyIdeas.no    Coronavirus Source NASOPHARYNGEAL  Final    Comment: Performed at [facility], [address] [place], [place] [address]  Respiratory Panel by PCR     Status: None   Collection Time: 03/01/19  5:20 AM  Result Value Ref Range Status   Adenovirus NOT DETECTED NOT DETECTED Final   Coronavirus 229E NOT DETECTED NOT DETECTED Final    Comment: (NOTE) The Coronavirus on the Respiratory Panel, DOES NOT test for the novel  Coronavirus (2019 nCoV)    Coronavirus HKU1 NOT DETECTED NOT DETECTED Final   Coronavirus NL63 NOT DETECTED NOT DETECTED Final   Coronavirus OC43 NOT DETECTED NOT DETECTED Final   Metapneumovirus NOT DETECTED NOT DETECTED Final   Rhinovirus / Enterovirus NOT DETECTED NOT DETECTED Final   Influenza A NOT DETECTED NOT DETECTED Final   Influenza B NOT DETECTED NOT DETECTED Final   Parainfluenza Virus 1 NOT DETECTED NOT DETECTED Final   Parainfluenza Virus 2 NOT DETECTED NOT DETECTED Final   Parainfluenza Virus 3 NOT DETECTED NOT DETECTED Final   Parainfluenza Virus 4 NOT DETECTED NOT DETECTED Final   Respiratory Syncytial Virus NOT DETECTED NOT DETECTED Final   Bordetella pertussis NOT DETECTED NOT DETECTED Final    Chlamydophila pneumoniae NOT DETECTED NOT DETECTED Final   Mycoplasma pneumoniae NOT DETECTED NOT DETECTED Final    Comment: Performed at [facility], [place]. [address], [place], [place] [address]  MRSA PCR Screening     Status: None   Collection Time: 03/02/19 11:11 AM  Result Value Ref Range Status   MRSA by PCR NEGATIVE NEGATIVE Final    Comment:        The GeneXpert MRSA Assay (FDA approved for NASAL specimens only), is one component of a comprehensive MRSA colonization surveillance program. It is not intended to diagnose MRSA infection nor to guide or monitor  treatment for MRSA infections. Performed at [facility], [place] [address], [place], [place] [address]      Labs: BNP (last 3 results) Recent Labs    03/01/19 0120  BNP 8,657.8*   Basic Metabolic Panel: Recent Labs  Lab 03/02/19 0418 03/03/19 0534 03/04/19 0252 03/05/19 1130 03/06/19 0451 03/06/19 1814  NA 128* 131* 133* 131* 133*  --   K 3.4* 3.4* 3.5 4.2 3.9  --   CL 94* 97* 99 97* 97*  --   CO2 22 23 21* 20* 26  --   GLUCOSE 120* 127* 135* 172* 124*  --   BUN 15 10 7* 16 12  --   CREATININE 0.99 0.86 0.79 1.08 0.78  --   CALCIUM 7.9* 8.1* 8.3* 8.4* 8.4*  --   MG 1.7 1.9 1.9  --   --  2.0   Liver Function Tests: Recent Labs  Lab 03/01/19 0117 03/01/19 0509 03/02/19 0418 03/03/19 0534 03/04/19 0252  AST 34 36 17 31 27   ALT 16 18 15 23 29   ALKPHOS 64 56 52 56 76  BILITOT 1.7* 1.9* 1.4* 1.0 1.1  PROT 5.9* 6.0* 5.6* 5.2* 5.6*  ALBUMIN 2.7* 2.6* 2.4* 2.4* 2.4*   No results for input(s): LIPASE, AMYLASE in the last 168 hours. No results for input(s): AMMONIA in the last 168 hours. CBC: Recent Labs  Lab 03/01/19 0117  03/02/19 0418 03/03/19 0534 03/04/19 0252 03/05/19 1130 03/06/19 0451  WBC 13.1*   < > 9.6 9.4 10.2 18.7* 13.2*  NEUTROABS 11.6*  --  7.5 7.1 8.1*  --   --   HGB 14.2   < > 12.7* 12.0* 12.4* 11.6* 11.2*  HCT 43.0   < > 36.5* 34.7* 37.1* 34.6* 33.1*  MCV 85.3   < >  83.7 84.6 83.7 85.4 85.3  PLT 144*   < > 141* 158 183 255 259   < > = values in this interval not displayed.   Cardiac Enzymes: Recent Labs  Lab 03/01/19 0509 03/01/19 0943 03/01/19 1610  TROPONINI 0.06* 0.05* 0.05*   BNP: Invalid input(s): POCBNP CBG: Recent Labs  Lab 03/06/19 0807 03/06/19 1147 03/06/19 1607 03/06/19 2052 03/07/19 0734  GLUCAP 128* 105* 113* 129* 98   D-Dimer No results for input(s): DDIMER in the last 72 hours. Hgb A1c No results for input(s): HGBA1C in the last 72 hours. Lipid Profile No results for input(s): CHOL, HDL, LDLCALC, TRIG, CHOLHDL, LDLDIRECT in the last 72 hours. Thyroid function studies No results for input(s): TSH, T4TOTAL, T3FREE, THYROIDAB in the last 72 hours.  Invalid input(s): FREET3 Anemia work up No results for input(s): VITAMINB12, FOLATE, FERRITIN, TIBC, IRON, RETICCTPCT in the last 72 hours. Urinalysis    Component Value Date/Time   COLORURINE YELLOW 03/02/2019 1228   APPEARANCEUR CLEAR 03/02/2019 1228   LABSPEC 1.026 03/02/2019 1228   PHURINE 5.0 03/02/2019 1228   GLUCOSEU NEGATIVE 03/02/2019 1228   HGBUR SMALL (A) 03/02/2019 1228   [place] 03/02/2019 1228   KETONESUR NEGATIVE 03/02/2019 1228   PROTEINUR NEGATIVE 03/02/2019 1228   UROBILINOGEN 0.2 11/05/2014 0303   NITRITE NEGATIVE 03/02/2019 1228   LEUKOCYTESUR NEGATIVE 03/02/2019 1228   Sepsis Labs Invalid input(s): PROCALCITONIN,  WBC,  LACTICIDVEN Microbiology Recent Results (from the past 240 hour(s))  Blood culture (routine x 2)     Status: None   Collection Time: 03/01/19  3:37 AM  Result Value Ref Range Status   Specimen Description BLOOD BLOOD RIGHT FOREARM  Final   Special Requests   Final    BOTTLES DRAWN AEROBIC AND ANAEROBIC Blood Culture adequate volume   Culture   Final    NO GROWTH 5 DAYS Performed at [facility], [address] [place], [place] [address]    Report Status 03/06/2019 FINAL  Final  Blood culture (routine  x 2)     Status: None   Collection Time: 03/01/19  3:43 AM  Result Value Ref Range Status   Specimen Description BLOOD LEFT WRIST  Final   Special Requests   Final    BOTTLES DRAWN AEROBIC ONLY Blood Culture results may not be optimal due to an excessive volume of blood received in culture bottles   Culture   Final    NO GROWTH 5 DAYS Performed at [facility], [place] [address] [place], [place] [address]    Report Status 03/06/2019 FINAL  Final  Novel Coronavirus, NAA (hospital order; send-out to ref lab)     Status: None   Collection Time: 03/01/19  5:20 AM  Result Value Ref Range Status   SARS-CoV-2, NAA NOT DETECTED NOT DETECTED Final    Comment: Negative (Not Detected) results do not exclude infection caused by SARS CoV 2 and should not be used as the sole basis for treatment or other patient management decisions. Optimum specimen types and timing for peak viral levels during infections caused  by SARS CoV 2 have not been determined. Collection of multiple specimens (types and time points) from the same patient may be necessary to detect the virus. Improper specimen collection and handling, sequence variability underlying assay primers and or probes, or the presence of organisms in  quantities less than the limit of detection of the assay may lead to false negative results. Positive and negative predictive values of testing are highly dependent on prevalence. False negative results are more likely when prevalence of disease is high. (NOTE) The expected result is Negative (Not Detected). The SARS CoV 2 test is intended for the presumptive qualitative  detection of nucleic acid from SARS CoV 2 in upper and lower  respir atory specimens. Testing methodology is real time RT PCR. Test results must be correlated with clinical presentation and  evaluated in the context of other laboratory and epidemiologic data.  Test performance can be affected because the epidemiology and  clinical  spectrum of infection caused by SARS CoV 2 is not fully  known. For example, the optimum types of specimens to collect and  when during the course of infection these specimens are most likely  to contain detectable viral RNA may not be known. This test has not been Food and Drug Administration (FDA) cleared or  approved and has been authorized by FDA under an Emergency Use  Authorization (EUA). The test is only authorized for the duration of  the declaration that circumstances exist justifying the authorization  of emergency use of in vitro diagnostic tests for detection and or  diagnosis of SARS CoV 2 under Section 564(b)(1) of the Act, 21 U.S.C.  section [phone] 3(b)(1), unless the authorization is terminated or   revoked sooner. [facility] is certified under the  Clinical Laboratory Improvement Amendments of 1988 (CLIA), 42 U.S.C.  section [phone], to perform high complexity tests. Performed at [facility] [id number], [place], [place] [address] Laboratory Director: Loleta Books, MD Fact Sheet for Healthcare Providers  BankingDealers.co.za Fact Sheet for Patients  StrictlyIdeas.no  Coronavirus Source NASOPHARYNGEAL  Final    Comment: Performed at [facility], [place] [address], [place], [place] [address]  Respiratory Panel by PCR     Status: None   Collection Time: 03/01/19  5:20 AM  Result Value Ref Range Status   Adenovirus NOT DETECTED NOT DETECTED Final   Coronavirus 229E NOT DETECTED NOT DETECTED Final    Comment: (NOTE) The Coronavirus on the Respiratory Panel, DOES NOT test for the novel  Coronavirus (2019 nCoV)    Coronavirus HKU1 NOT DETECTED NOT DETECTED Final   Coronavirus NL63 NOT DETECTED NOT DETECTED Final   Coronavirus OC43 NOT DETECTED NOT DETECTED Final   Metapneumovirus NOT DETECTED NOT DETECTED Final   Rhinovirus / Enterovirus NOT  DETECTED NOT DETECTED Final   Influenza A NOT DETECTED NOT DETECTED Final   Influenza B NOT DETECTED NOT DETECTED Final   Parainfluenza Virus 1 NOT DETECTED NOT DETECTED Final   Parainfluenza Virus 2 NOT DETECTED NOT DETECTED Final   Parainfluenza Virus 3 NOT DETECTED NOT DETECTED Final   Parainfluenza Virus 4 NOT DETECTED NOT DETECTED Final   Respiratory Syncytial Virus NOT DETECTED NOT DETECTED Final   Bordetella pertussis NOT DETECTED NOT DETECTED Final   Chlamydophila pneumoniae NOT DETECTED NOT DETECTED Final   Mycoplasma pneumoniae NOT DETECTED NOT DETECTED Final    Comment: Performed at [facility], [place]. [address], [place], [place] [address]  MRSA PCR Screening     Status: None   Collection Time: 03/02/19 11:11 AM  Result Value Ref Range Status   MRSA by PCR NEGATIVE NEGATIVE Final    Comment:        The GeneXpert MRSA Assay (FDA approved for NASAL specimens only), is one component of a comprehensive MRSA colonization surveillance program. It is not intended to diagnose MRSA infection nor to guide or monitor treatment for MRSA infections. Performed at [facility], [place] [address] [place], [place] [address]      Time coordinating discharge: 40 minutes  SIGNED:   Elmarie Shiley, MD  [facility]

## 2019-03-07 NOTE — TOC Progression Note (Addendum)
Transition of Care [facility]) - Progression Note    Patient Details  Name: Anthony [name] MRN: [id number] Date of Birth: 1934-05-29  Transition of Care [facility]) CM/SW [place], [place] Phone Number: 03/07/2019, 8:32 AM  Clinical Narrative:    9:46am- Pt initially denied by [employer], peer to peer offered with Dr. Coralie Carpen, MD, [phone]. Dr. Tyrell Antonio paged with this information. Daughter [name] contacted, updated her- she is understandably upset but CSW did explain that sometimes insurance plans view pt's as "custodial"- did provide support that pt MD here at hospital would complete peer to peer.   9:01am- Spoke with [place] at [facility], they are still processing pt auth.  8:32am- Aware d/c summary complete, await response from [employer].   Expected Discharge Plan: [place] Barriers to Discharge: No Barriers Identified  Expected Discharge Plan and Services Expected Discharge Plan: [place]   Discharge Planning Services: NA Post Acute Care Choice: [place] Living arrangements for the past 2 months: Skilled Nursing Facility([facility]) Expected Discharge Date: 03/07/19               DME Arranged: N/A DME Agency: NA HH Arranged: NA HH Agency: NA   Social Determinants of Health (SDOH) Interventions    Readmission Risk Interventions Readmission Risk Prevention Plan 03/06/2019  Transportation Screening Complete  Medication Review Press photographer) Complete  PCP or Specialist appointment within 3-5 days of discharge Complete  HRI or Home Care Consult Complete  SW Recovery Care/Counseling Consult Complete  Palliative Care Screening Not [place] Complete  Some recent data might be hidden

## 2019-03-08 DIAGNOSIS — R627 Adult failure to thrive: Secondary | ICD-10-CM | POA: Diagnosis not present

## 2019-03-08 DIAGNOSIS — Z9181 History of falling: Secondary | ICD-10-CM | POA: Diagnosis not present

## 2019-03-08 DIAGNOSIS — N400 Enlarged prostate without lower urinary tract symptoms: Secondary | ICD-10-CM | POA: Diagnosis not present

## 2019-03-08 DIAGNOSIS — I5022 Chronic systolic (congestive) heart failure: Secondary | ICD-10-CM | POA: Diagnosis not present

## 2019-03-08 DIAGNOSIS — Z96649 Presence of unspecified artificial hip joint: Secondary | ICD-10-CM | POA: Diagnosis not present

## 2019-03-08 DIAGNOSIS — K5010 Crohn's disease of large intestine without complications: Secondary | ICD-10-CM | POA: Diagnosis not present

## 2019-03-08 DIAGNOSIS — J9601 Acute respiratory failure with hypoxia: Secondary | ICD-10-CM | POA: Diagnosis not present

## 2019-03-08 DIAGNOSIS — J189 Pneumonia, unspecified organism: Secondary | ICD-10-CM | POA: Diagnosis not present

## 2019-03-08 DIAGNOSIS — R2689 Other abnormalities of gait and mobility: Secondary | ICD-10-CM | POA: Diagnosis not present

## 2019-03-08 DIAGNOSIS — I2510 Atherosclerotic heart disease of native coronary artery without angina pectoris: Secondary | ICD-10-CM | POA: Diagnosis not present

## 2019-03-08 DIAGNOSIS — D649 Anemia, unspecified: Secondary | ICD-10-CM | POA: Diagnosis not present

## 2019-03-08 DIAGNOSIS — L89302 Pressure ulcer of unspecified buttock, stage 2: Secondary | ICD-10-CM

## 2019-03-08 DIAGNOSIS — L97429 Non-pressure chronic ulcer of left heel and midfoot with unspecified severity: Secondary | ICD-10-CM | POA: Diagnosis not present

## 2019-03-08 DIAGNOSIS — I10 Essential (primary) hypertension: Secondary | ICD-10-CM | POA: Diagnosis not present

## 2019-03-08 DIAGNOSIS — R531 Weakness: Secondary | ICD-10-CM | POA: Diagnosis not present

## 2019-03-08 DIAGNOSIS — R52 Pain, unspecified: Secondary | ICD-10-CM | POA: Diagnosis not present

## 2019-03-08 DIAGNOSIS — N3281 Overactive bladder: Secondary | ICD-10-CM | POA: Diagnosis not present

## 2019-03-08 DIAGNOSIS — J8489 Other specified interstitial pulmonary diseases: Secondary | ICD-10-CM | POA: Diagnosis not present

## 2019-03-08 DIAGNOSIS — B370 Candidal stomatitis: Secondary | ICD-10-CM | POA: Diagnosis not present

## 2019-03-08 DIAGNOSIS — A419 Sepsis, unspecified organism: Secondary | ICD-10-CM | POA: Diagnosis not present

## 2019-03-08 DIAGNOSIS — I959 Hypotension, unspecified: Secondary | ICD-10-CM | POA: Diagnosis not present

## 2019-03-08 DIAGNOSIS — R278 Other lack of coordination: Secondary | ICD-10-CM | POA: Diagnosis not present

## 2019-03-08 DIAGNOSIS — R0902 Hypoxemia: Secondary | ICD-10-CM | POA: Diagnosis not present

## 2019-03-08 DIAGNOSIS — M6281 Muscle weakness (generalized): Secondary | ICD-10-CM | POA: Diagnosis not present

## 2019-03-08 DIAGNOSIS — S72002D Fracture of unspecified part of neck of left femur, subsequent encounter for closed fracture with routine healing: Secondary | ICD-10-CM | POA: Diagnosis not present

## 2019-03-08 DIAGNOSIS — Z9981 Dependence on supplemental oxygen: Secondary | ICD-10-CM | POA: Diagnosis not present

## 2019-03-08 DIAGNOSIS — F0390 Unspecified dementia without behavioral disturbance: Secondary | ICD-10-CM | POA: Diagnosis not present

## 2019-03-08 DIAGNOSIS — Z96642 Presence of left artificial hip joint: Secondary | ICD-10-CM | POA: Diagnosis not present

## 2019-03-08 DIAGNOSIS — E871 Hypo-osmolality and hyponatremia: Secondary | ICD-10-CM | POA: Diagnosis not present

## 2019-03-08 DIAGNOSIS — M25552 Pain in left hip: Secondary | ICD-10-CM | POA: Diagnosis not present

## 2019-03-08 DIAGNOSIS — W19XXXA Unspecified fall, initial encounter: Secondary | ICD-10-CM | POA: Diagnosis not present

## 2019-03-08 DIAGNOSIS — S72042A Displaced fracture of base of neck of left femur, initial encounter for closed fracture: Secondary | ICD-10-CM | POA: Diagnosis not present

## 2019-03-08 DIAGNOSIS — M069 Rheumatoid arthritis, unspecified: Secondary | ICD-10-CM | POA: Diagnosis not present

## 2019-03-08 DIAGNOSIS — S72012D Unspecified intracapsular fracture of left femur, subsequent encounter for closed fracture with routine healing: Secondary | ICD-10-CM | POA: Diagnosis not present

## 2019-03-08 DIAGNOSIS — M2550 Pain in unspecified joint: Secondary | ICD-10-CM | POA: Diagnosis not present

## 2019-03-08 DIAGNOSIS — R296 Repeated falls: Secondary | ICD-10-CM | POA: Diagnosis not present

## 2019-03-08 DIAGNOSIS — Z7401 Bed confinement status: Secondary | ICD-10-CM | POA: Diagnosis not present

## 2019-03-08 DIAGNOSIS — T8149XA Infection following a procedure, other surgical site, initial encounter: Secondary | ICD-10-CM | POA: Diagnosis not present

## 2019-03-08 LAB — CBC
HCT: 33.1 % — ABNORMAL LOW (ref 39.0–52.0)
Hemoglobin: 10.7 g/dL — ABNORMAL LOW (ref 13.0–17.0)
MCH: 27.9 pg (ref 26.0–34.0)
MCHC: 32.3 g/dL (ref 30.0–36.0)
MCV: 86.2 fL (ref 80.0–100.0)
Platelets: 345 10*3/uL (ref 150–400)
RBC: 3.84 MIL/uL — ABNORMAL LOW (ref 4.22–5.81)
RDW: 14.6 % (ref 11.5–15.5)
WBC: 11.9 10*3/uL — ABNORMAL HIGH (ref 4.0–10.5)
nRBC: 0 % (ref 0.0–0.2)

## 2019-03-08 LAB — BASIC METABOLIC PANEL
Anion gap: 11 (ref 5–15)
BUN: 8 mg/dL (ref 8–23)
CO2: 23 mmol/L (ref 22–32)
Calcium: 8.4 mg/dL — ABNORMAL LOW (ref 8.9–10.3)
Chloride: 103 mmol/L (ref 98–111)
Creatinine, Ser: 0.79 mg/dL (ref 0.61–1.24)
GFR calc Af Amer: >60 mL/min (ref >60)
GFR calc non Af Amer: >60 mL/min (ref >60)
Glucose, Bld: 118 mg/dL — ABNORMAL HIGH (ref 70–99)
Potassium: 3.8 mmol/L (ref 3.5–5.1)
Sodium: 137 mmol/L (ref 135–145)

## 2019-03-08 LAB — GLUCOSE, CAPILLARY
Glucose-Capillary: 104 mg/dL — ABNORMAL HIGH (ref 70–99)
Glucose-Capillary: 186 mg/dL — ABNORMAL HIGH (ref 70–99)

## 2019-03-08 MED ORDER — FUROSEMIDE 40 MG PO TABS: 40.0000 mg | ORAL_TABLET | Freq: Every day | ORAL | 0 refills | Status: DC

## 2019-03-08 NOTE — Progress Notes (Signed)
Pt waiting for ptar to pick him for discharge back to [facility] this pm. Report called and given to facility nurse with no concerns voiced

## 2019-03-08 NOTE — TOC Transition Note (Signed)
Transition of Care [facility]) - CM/SW Discharge Note   Patient Details  Name: Anthony [name] MRN: [id number] Date of Birth: 11/01/1934  Transition of Care [facility]) CM/SW Contact:  Alexander Mt, [place] Phone Number: 03/08/2019, 11:35 AM   Clinical Narrative:    Pt now medically cleared, information faxed to [facility]. DNR and scripts as well as PTAR papers on chart. Pt pick up at 2:30 with PTAR.   Final next level of care: [place] Barriers to Discharge: Barriers Resolved   Patient Goals and CMS Choice Patient states their goals for this hospitalization and ongoing recovery are:: to go back to [email or web address].gov Compare Post Acute Care list provided to:: Patient Represenative (must comment) Choice offered to / list presented to : Adult Children(daughter [name])  Discharge Placement   Existing PASRR number confirmed : 03/06/19          Patient chooses bed at: [facility] Patient to be transferred to facility by: [place] Name of family member notified: pt daughter Patient and family notified of of transfer: 03/07/19 and 03/08/19  Discharge Plan and Services   Discharge Planning Services: NA Post Acute Care Choice: [place]          DME Arranged: N/A DME Agency: NA HH Arranged: NA HH Agency: NA   Social Determinants of Health ([place]) Interventions     Readmission Risk Interventions Readmission Risk Prevention Plan 03/06/2019  Transportation Screening Complete  Medication Review Press photographer) Complete  PCP or Specialist appointment within 3-5 days of discharge Complete  HRI or Home Care Consult Complete  SW Recovery Care/Counseling Consult Complete  Palliative Care Screening Not [place] Complete  Some recent data might be hidden

## 2019-03-08 NOTE — Discharge Summary (Signed)
Complete discharge summary performed by Dr Tyrell Antonio on 03/07/19.  His discharge was held due to hypotension from dehydration secondary to diarrhea.  All of this has resolved today.  Advised to continue holding Lasix for 2 more days, can be resumed on 4/18. Orthostatic negative this morning.  Does have difficulty getting up due to his surgical issues and difficulty bearing weight.  Otherwise remained stable for discharge with previous recommendations.  I have seen and examined the patient at bedside today.  He denies any dizziness, chest pain, shortness of breath and other complaints.  He does admit of left hip pain as expected.  Diarrhea resolved.  Vital signs are stable and reviewed. On physical exam he is clear to auscultation bilaterally.  Left hip dressing noted which is dry and intact.  Labs remain stable.  Hemoglobin 10.7.  Assessment and plan: 1. left femoral neck fracture status post hemiarthroplasty 4/11 2.  Healthcare acquired pneumonia with hypoxia.  COVID negative 3. hypotension due to dehydration, resolved 4. chronic systolic heart failure 5. overactive bladder 6.  Stage II pressure ulcer  -Follow-up outpatient orthopedic.  DVT prophylaxis per orthopedic. -2 more days of oral cefdinir -Hold off on Lasix for 2 more days, resume 4/18. -Rest of the care as previously planned.  Call with questions as needed  Gerlean Ren MD [facility]

## 2019-03-08 NOTE — Progress Notes (Signed)
Physical Therapy Treatment Patient Details Name: Anthony [name] MRN: [id number] DOB: [birth year] Today's Date: 03/08/2019    History of Present Illness 86 yo male s/p fall resulting in partial L hip hemiarthroplasty. PMHx: RA, Crohn's dx, IDDM, HTN, HLD, CHF, CAD heart failure, sleep apnea    PT Comments    Pt performed rolling and movement from supine to sitting with moderate to max assistance +1.  He presents with poor balance sitting edge of bed and required cueing and facilitation to correct sitting balance.  Pt presents with increased pain during palpation of L heel, informed nurse and showed nurse L heel.  Pt presents with increased redness and small ( appears to be chronic with callus build up ) wound.  Based on level of assistance required he would benefit from SNF placement.      Follow Up Recommendations  SNF     Equipment Recommendations  None recommended by PT    Recommendations for Other Services       Precautions / Restrictions Precautions Precautions: Fall Restrictions Weight Bearing Restrictions: Yes LLE Weight Bearing: Weight bearing as tolerated    Mobility  Bed Mobility Overal bed mobility: Needs Assistance Bed Mobility: Supine to Sit;Rolling Rolling: Mod assist(to roll to the R for pericare)   Supine to sit: Max assist;+2 for safety/equipment     General bed mobility comments: Pt required assistance to advance LEs to edge of bed and to elevate trunk into sitting.  Pt with posterior bias and required assistance to maintain sitting balance.    Transfers Overall transfer level: Needs assistance Equipment used: 2 person hand held assist Transfers: Squat Pivot Transfers   Stand pivot transfers: +2 physical assistance;Mod assist       General transfer comment: Pt required assistance to clear bottom and pivot hips from bed to recliner chair.  Pt unable to fully stand but followed commands and facilitation to move from bed to recliner chair.     Ambulation/Gait Ambulation/Gait assistance: (NT)               Stairs             Wheelchair Mobility    Modified Rankin (Stroke Patients Only)       Balance     Sitting balance-Leahy Scale: Poor Sitting balance - Comments: Able to maintain for brief periods of time during session.  Required min to moderate assistance to correct.   Postural control: Posterior lean   Standing balance-Leahy Scale: Zero Standing balance comment: squat pivot with +2 external assistance.                              Cognition Arousal/Alertness: Awake/alert Behavior During Therapy: WFL for tasks assessed/performed Overall Cognitive Status: Within Functional Limits for tasks assessed(but cognition not formally assessed reports he does not remember getting up with therapy yesterday.  )                                        Exercises      General Comments        Pertinent Vitals/Pain Pain Assessment: 0-10 Faces Pain Scale: Hurts even more Pain Location: LLE, with movement, relaxed at rest/  L heel with pressure ( appears red with small wound) Pain Descriptors / Indicators: Guarding;Grimacing;Operative site guarding Pain Intervention(s): Monitored during session;Repositioned(informed RN and floated heels)  Home Living                      Prior Function            PT Goals (current goals can now be found in the care plan section) Acute Rehab PT Goals Patient Stated Goal: to hurt less Potential to Achieve Goals: Fair Progress towards PT goals: Progressing toward goals    Frequency    Min 2X/week      PT Plan Current plan remains appropriate;Frequency needs to be updated    Co-evaluation              AM-PAC PT "6 Clicks" Mobility   Outcome Measure  Help needed turning from your back to your side while in a flat bed without using bedrails?: A Lot Help needed moving from lying on your back to sitting on the side of  a flat bed without using bedrails?: Total Help needed moving to and from a bed to a chair (including a wheelchair)?: Total Help needed standing up from a chair using your arms (e.g., wheelchair or bedside chair)?: Total Help needed to walk in hospital room?: Total Help needed climbing 3-5 steps with a railing? : Total 6 Click Score: 7    End of Session Equipment Utilized During Treatment: Gait belt Activity Tolerance: Patient tolerated treatment well Patient left: with call bell/phone within reach;in chair;with chair alarm set Nurse Communication: Mobility status(NT present to assist during transfer) PT Visit Diagnosis: Other abnormalities of gait and mobility (R26.89);Muscle weakness (generalized) (M62.81);Pain Pain - Right/Left: Left Pain - part of body: Hip     Time: 1004-1030 PT Time Calculation (min) (ACUTE ONLY): 26 min  Charges:  $Therapeutic Activity: 23-37 mins                     Governor Rooks, PTA Acute Rehabilitation Services Pager [phone] Office [phone]     Rosenda Geffrard [name] 03/08/2019, 11:25 AM

## 2019-03-08 NOTE — Social Work (Signed)
Clinical Social Worker facilitated patient discharge including contacting patient family and facility to confirm patient discharge plans.  Clinical information faxed to facility and family agreeable with plan.  CSW arranged ambulance transport via PT[facility] to [facility] at 2pm RN to call [phone]  with report prior to discharge.  Clinical Social Worker will sign off for now as social work intervention is no longer needed. Please consult [place] again if new need arises.  Westley Hummer, MSW, [place] Social Worker [phone]

## 2019-03-08 NOTE — Progress Notes (Signed)
Pt discharged to SNF in stable condition

## 2019-03-09 DIAGNOSIS — R627 Adult failure to thrive: Secondary | ICD-10-CM | POA: Diagnosis not present

## 2019-03-09 DIAGNOSIS — R531 Weakness: Secondary | ICD-10-CM | POA: Diagnosis not present

## 2019-03-09 DIAGNOSIS — F0390 Unspecified dementia without behavioral disturbance: Secondary | ICD-10-CM | POA: Diagnosis not present

## 2019-03-09 DIAGNOSIS — L97429 Non-pressure chronic ulcer of left heel and midfoot with unspecified severity: Secondary | ICD-10-CM | POA: Diagnosis not present

## 2019-03-09 LAB — GLUCOSE, CAPILLARY: Glucose-Capillary: 105 mg/dL — ABNORMAL HIGH (ref 70–99)

## 2019-03-10 DIAGNOSIS — J189 Pneumonia, unspecified organism: Secondary | ICD-10-CM | POA: Diagnosis not present

## 2019-03-10 DIAGNOSIS — D649 Anemia, unspecified: Secondary | ICD-10-CM | POA: Diagnosis not present

## 2019-03-10 DIAGNOSIS — E871 Hypo-osmolality and hyponatremia: Secondary | ICD-10-CM | POA: Diagnosis not present

## 2019-03-10 DIAGNOSIS — S72002D Fracture of unspecified part of neck of left femur, subsequent encounter for closed fracture with routine healing: Secondary | ICD-10-CM | POA: Diagnosis not present

## 2019-03-10 LAB — GLUCOSE, CAPILLARY: Glucose-Capillary: 89 mg/dL (ref 70–99)

## 2019-03-14 DIAGNOSIS — S72002D Fracture of unspecified part of neck of left femur, subsequent encounter for closed fracture with routine healing: Secondary | ICD-10-CM | POA: Diagnosis not present

## 2019-03-14 DIAGNOSIS — J189 Pneumonia, unspecified organism: Secondary | ICD-10-CM | POA: Diagnosis not present

## 2019-03-14 DIAGNOSIS — N400 Enlarged prostate without lower urinary tract symptoms: Secondary | ICD-10-CM | POA: Diagnosis not present

## 2019-03-14 DIAGNOSIS — E871 Hypo-osmolality and hyponatremia: Secondary | ICD-10-CM | POA: Diagnosis not present

## 2019-03-15 ENCOUNTER — Ambulatory Visit (INDEPENDENT_AMBULATORY_CARE_PROVIDER_SITE_OTHER): Payer: PPO | Admitting: Orthopaedic Surgery

## 2019-03-15 ENCOUNTER — Encounter (INDEPENDENT_AMBULATORY_CARE_PROVIDER_SITE_OTHER): Payer: Self-pay | Admitting: Orthopaedic Surgery

## 2019-03-15 ENCOUNTER — Ambulatory Visit (INDEPENDENT_AMBULATORY_CARE_PROVIDER_SITE_OTHER): Payer: PPO

## 2019-03-15 ENCOUNTER — Other Ambulatory Visit: Payer: Self-pay

## 2019-03-15 DIAGNOSIS — Z96649 Presence of unspecified artificial hip joint: Secondary | ICD-10-CM

## 2019-03-15 NOTE — Progress Notes (Signed)
HPI: [name] comes in today follow-up status post left hip hemiarthroplasty for 03/04/2019.  Patient is accompanied by his daughter.  He is contrary side manner.  His daughter really has been unable to visit with him due to COVID-19 pandemic is unsure of what he is actually been doing at a skilled facility.  Again he has dementia.  Physical exam: General well-developed well-nourished male no acute distress. Left hip surgical incisions well approximated with staples.  There is no signs of infection.  Left calf supple nontender.  He is able dorsiflex plantarflex the ankle.  Radiographs: AP pelvis and lateral view left hip shows the left hip hemiarthroplasty to be well located well-seated.  There is no evidence of fracture or hardware failure.  Impression: Status post left hip hemiarthroplasty  Plan: We will leave his staples in for now.  We will see him back next week and remove staples in the interim he can shower and get the incision wet needs to be dried completely and then a dry dressing applied.  He continue work with physical therapy for range of motion strengthening of the left hip.  Questions were encouraged and answered by Dr. Ninfa [name] and myself.

## 2019-03-16 ENCOUNTER — Telehealth (INDEPENDENT_AMBULATORY_CARE_PROVIDER_SITE_OTHER): Payer: Self-pay | Admitting: Orthopaedic Surgery

## 2019-03-16 NOTE — Telephone Encounter (Signed)
I would be fine with him removing his staples in their facility and placing Steri-Strips.  We would certainly rather see him ourselves because that is helping his family actually get a chance to see him as well.  Plus we can truly make sure the incision is doing well.

## 2019-03-16 NOTE — Telephone Encounter (Signed)
Please advise 

## 2019-03-16 NOTE — Telephone Encounter (Signed)
Wandra Scot from [employer] left a message stating that the patient has an appointment next week to remove his sutures.  She is wanting to know if Dr. Ninfa Linden would okay it for them to remove his staples at the facility.  CB#[phone].  Thank you.

## 2019-03-17 NOTE — Telephone Encounter (Signed)
Left vm for kathy of the below message

## 2019-03-22 ENCOUNTER — Encounter (INDEPENDENT_AMBULATORY_CARE_PROVIDER_SITE_OTHER): Payer: Self-pay | Admitting: Orthopaedic Surgery

## 2019-03-22 ENCOUNTER — Ambulatory Visit (INDEPENDENT_AMBULATORY_CARE_PROVIDER_SITE_OTHER): Payer: PPO | Admitting: Orthopaedic Surgery

## 2019-03-22 ENCOUNTER — Other Ambulatory Visit: Payer: Self-pay

## 2019-03-22 DIAGNOSIS — Z96649 Presence of unspecified artificial hip joint: Secondary | ICD-10-CM

## 2019-03-22 NOTE — Progress Notes (Signed)
Office Visit Note   Patient: Anthony [name]           Date of Birth: 1933/12/26           MRN: [id number] Visit Date: 03/22/2019              Requested by: No referring provider defined for this encounter. PCP: System, Pcp Not In   Assessment & Plan: Visit Diagnoses:  1. S/P hip hemiarthroplasty     Plan: Recommend that he continue to have physical therapy 3-4 times a week to work on gait balance and strengthening status post left total hip arthroplasty.  We will see him back in 2 weeks for wound check and also to check his progress with therapy.  Questions were encouraged and answered both the patient and his daughters present today.  Follow-Up Instructions: Return in about 2 weeks (around 04/05/2019).   Orders:  No orders of the defined types were placed in this encounter.  No orders of the defined types were placed in this encounter.     Procedures: No procedures performed   Clinical Data: No additional findings.   Subjective: Chief Complaint  Patient presents with  . Left Hip - Follow-up, Routine Post Op    HPI Anthony [name] returns today for follow-up of his left hip status post left hip hemiarthroplasty left 18 days postop.  Patient presents with his daughter.  He does have dementia.  He is residing in a skilled facility.  He states that he is walking some with therapy and still very stiff as far as his hip is very weak.  His daughter is been unable to see him due to the COVID 19 pandemic no one is allowed in a skilled facility.  She is concerned due to the fact that his therapy is been canceled.  Review of Systems Negative for fevers or chills.  Objective: Vital Signs: There were no vitals taken for this visit.  Physical Exam Constitutional:      Appearance: He is not ill-appearing or diaphoretic.  Pulmonary:     Effort: Pulmonary effort is normal.  Neurological:     Mental Status: He is alert.     Ortho Exam Left hip: Tolerates gentle range of  motion of the hip with stiffness.  Surgical incisions healing well no signs of infection.  Staples well approximate skin.  Staples were removed Steri-Strips applied. Specialty Comments:  No specialty comments available.  Imaging: No results found.   PMFS History: Patient Active Problem List   Diagnosis Date Noted  . Pressure injury of skin 03/05/2019  . Suspected Covid-19 Virus Infection 03/01/2019  . Acute respiratory failure with hypoxia ([place]) 03/01/2019  . Hip fracture ([place]) 03/01/2019  . Fall 03/01/2019  . Displaced fracture of base of neck of left femur, initial encounter for closed fracture ([place])   . Fever   . Nonspecific interstitial pneumonia ([place])   . Febrile illness   . Thrush   . Hypoxia   . Sepsis ([place]) 10/04/2018  . Fatty liver 04/06/2018  . Acute pancreatitis 04/04/2018  . Abdominal pain, right lower quadrant 05/28/2015  . Absolute anemia 05/28/2015  . Aortic atherosclerosis ([place]) 05/28/2015  . Altered blood in stool 05/28/2015  . Chronic systolic heart failure ([place]) 05/28/2015  . Syncope and collapse 05/28/2015  . Arteriosclerosis of coronary artery 05/28/2015  . Cough 05/28/2015  . Crohn's disease of large bowel ([place]) 05/28/2015  . D (diarrhea) 05/28/2015  . Displacement of cervical intervertebral disc without myelopathy  05/28/2015  . Benign essential HTN 05/28/2015  . Adenopathy 05/28/2015  . Hypercholesterolemia without hypertriglyceridemia 05/28/2015  . Hypo-osmolality and hyponatremia 05/28/2015  . Postinflammatory pulmonary fibrosis ([place]) 05/28/2015  . Healed myocardial infarct 05/28/2015  . Peptic esophagitis 05/28/2015  . 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 Paroxysmal ventricular tachycardia ([place]) 05/28/2015  . Decreased body weight 05/28/2015  . Weakness 11/05/2014  . Nausea with vomiting 11/05/2014  . Generalized weakness   . Dehydration   . Difficulty walking   . Pulmonary infiltrates 07/16/2014  . Chronic combined systolic and diastolic heart  failure ([place]) 05/31/2014  . Essential hypertension 05/31/2014  .  H/O Right inguinal hernia repair 01/15/2012  . Small fat containing right inguinal hernia that has been painful 08/05/2011  . Finger wound, simple, open 10/12/2008  . Benign prostatic hyperplasia without urinary obstruction 04/30/2008  . Synovitis and tenosynovitis 04/30/2008  . HLD (hyperlipidemia) 04/12/2008  . COLONIC POLYPS 11/21/2007  . MI 11/21/2007  . Coronary atherosclerosis 11/21/2007  . GERD 11/21/2007  . Rheumatoid arthritis ([place]) 11/21/2007  . SLEEP APNEA, MILD 11/21/2007   Past Medical History:  Diagnosis Date  . Arthritis    rheumatoid-  Dr Barkley Boards  . CHF (congestive heart failure) (HCC)    chronic systolic heart failure per Dr [name] [facility] note 09/23/11.   EKG 4/12, stress test  and cath from 8/12 on chart.  Clearance with LOV Dr [name] on chart  . Coronary artery disease   . Crohn disease ([place])   . Dysrhythmia    nonsustained,asymptomatic VT per Dr [name] office note  resulting in cath  . GERD (gastroesophageal reflux disease)   . Heart attack ([place]) 1996, 06/2011  . Hyperlipidemia   . Hypertension   . Pneumonia 1993  . Prostate troubles    states take alternating meds for bladder/prostate control- "age thing"  . Sleep apnea    sleep study years ago- has apnea but did not qualify for CPAP    Family History  Problem Relation Age of Onset  . Heart disease Mother   . Rheum arthritis Mother   . Emphysema Father     Past Surgical History:  Procedure Laterality Date  . CARDIAC CATHETERIZATION     1996/ 2012 report on chart  . COLONOSCOPY    . HIP ARTHROPLASTY Left 03/04/2019   Procedure: LEFT HIP HEMIARTHROPLASTY;  Surgeon: Mcarthur Rossetti, MD;  Location: [facility];  Service: Orthopedics;  Laterality: Left;  . INGUINAL HERNIA REPAIR  11/04/2011   Procedure: HERNIA REPAIR INGUINAL ADULT;  Surgeon: Pedro Earls, MD;  Location: [facility];  Service: General;  Laterality: Right;  Right Inguinal Hernia  Repair with Mesh  . KNEE ARTHROSCOPY Right 1976  . MENISCUS DEBRIDEMENT Left 1996  . TRIGGER FINGER RELEASE Right 10/10/2009  . UMBILICAL HERNIA REPAIR  2009  . WRIST SURGERY Left 04/25/91   Social History   Occupational History  . Not on file  Tobacco Use  . Smoking status: Former Smoker    Packs/day: 3.00    Years: 20.00    Pack years: 60.00    Types: Cigarettes    Last attempt to quit: 11/01/1970    Years since quitting: 48.4  . Smokeless tobacco: Never Used  . Tobacco comment: quit 1967  Substance and Sexual Activity  . Alcohol use: No  . Drug use: No  . Sexual activity: Not on file

## 2019-03-24 DIAGNOSIS — N400 Enlarged prostate without lower urinary tract symptoms: Secondary | ICD-10-CM | POA: Diagnosis not present

## 2019-03-24 DIAGNOSIS — E871 Hypo-osmolality and hyponatremia: Secondary | ICD-10-CM | POA: Diagnosis not present

## 2019-03-24 DIAGNOSIS — S72002D Fracture of unspecified part of neck of left femur, subsequent encounter for closed fracture with routine healing: Secondary | ICD-10-CM | POA: Diagnosis not present

## 2019-03-24 DIAGNOSIS — I10 Essential (primary) hypertension: Secondary | ICD-10-CM | POA: Diagnosis not present

## 2019-03-30 DIAGNOSIS — T8149XA Infection following a procedure, other surgical site, initial encounter: Secondary | ICD-10-CM | POA: Diagnosis not present

## 2019-03-30 DIAGNOSIS — I5022 Chronic systolic (congestive) heart failure: Secondary | ICD-10-CM | POA: Diagnosis not present

## 2019-03-30 DIAGNOSIS — S72002D Fracture of unspecified part of neck of left femur, subsequent encounter for closed fracture with routine healing: Secondary | ICD-10-CM | POA: Diagnosis not present

## 2019-04-04 DIAGNOSIS — S72002D Fracture of unspecified part of neck of left femur, subsequent encounter for closed fracture with routine healing: Secondary | ICD-10-CM | POA: Diagnosis not present

## 2019-04-04 DIAGNOSIS — T8149XA Infection following a procedure, other surgical site, initial encounter: Secondary | ICD-10-CM | POA: Diagnosis not present

## 2019-04-04 DIAGNOSIS — I5022 Chronic systolic (congestive) heart failure: Secondary | ICD-10-CM | POA: Diagnosis not present

## 2019-04-04 DIAGNOSIS — N400 Enlarged prostate without lower urinary tract symptoms: Secondary | ICD-10-CM | POA: Diagnosis not present

## 2019-04-05 ENCOUNTER — Encounter: Payer: Self-pay | Admitting: Orthopaedic Surgery

## 2019-04-05 ENCOUNTER — Other Ambulatory Visit: Payer: Self-pay

## 2019-04-05 ENCOUNTER — Ambulatory Visit (INDEPENDENT_AMBULATORY_CARE_PROVIDER_SITE_OTHER): Payer: PPO | Admitting: Orthopaedic Surgery

## 2019-04-05 DIAGNOSIS — Z96649 Presence of unspecified artificial hip joint: Secondary | ICD-10-CM

## 2019-04-05 NOTE — Progress Notes (Signed)
The patient is an 85 year old gentleman with dementia who was one-month status post a left hip hemiarthroplasty to treat an acute femoral neck fracture.  He is at his regular skilled nursing facility.  1 of the workers from the facility is with him today.  I want to see him back today just for a wound check.  He said no other issues overall.  I can easily put his left hip to internal and external rotation as well as flexion and it does not seem to bother him at all and he denies any significant pain.  The person who is with him says that he has been able to stand with her at the facility.  His incision does show scabbing with no evidence of infection and no drainage.  I gave instructions to the facility to place triple antibiotic ointment for Bactroban on the incision daily and to keep it clean.  I would like to see him back in just 2 weeks for wound check.  No x-rays are needed.

## 2019-04-06 ENCOUNTER — Telehealth: Payer: Self-pay | Admitting: Orthopaedic Surgery

## 2019-04-06 DIAGNOSIS — I2510 Atherosclerotic heart disease of native coronary artery without angina pectoris: Secondary | ICD-10-CM | POA: Diagnosis not present

## 2019-04-06 DIAGNOSIS — I10 Essential (primary) hypertension: Secondary | ICD-10-CM | POA: Diagnosis not present

## 2019-04-06 DIAGNOSIS — I5022 Chronic systolic (congestive) heart failure: Secondary | ICD-10-CM | POA: Diagnosis not present

## 2019-04-06 DIAGNOSIS — N400 Enlarged prostate without lower urinary tract symptoms: Secondary | ICD-10-CM | POA: Diagnosis not present

## 2019-04-06 NOTE — Telephone Encounter (Signed)
Patient fell last night. Daughter wanted to call and let Dr.Blackmon aware because of recent hipp replacement. Would like for Dr or CMA to call back and advise if appt should be made just in case to be checked out

## 2019-04-07 ENCOUNTER — Telehealth: Payer: Self-pay | Admitting: Orthopaedic Surgery

## 2019-04-07 DIAGNOSIS — N400 Enlarged prostate without lower urinary tract symptoms: Secondary | ICD-10-CM | POA: Diagnosis not present

## 2019-04-07 DIAGNOSIS — I2510 Atherosclerotic heart disease of native coronary artery without angina pectoris: Secondary | ICD-10-CM | POA: Diagnosis not present

## 2019-04-07 DIAGNOSIS — I5022 Chronic systolic (congestive) heart failure: Secondary | ICD-10-CM | POA: Diagnosis not present

## 2019-04-07 DIAGNOSIS — R296 Repeated falls: Secondary | ICD-10-CM | POA: Diagnosis not present

## 2019-04-07 NOTE — Telephone Encounter (Signed)
She states she will call back and make an appt if the facility can't xray him

## 2019-04-07 NOTE — Telephone Encounter (Signed)
Patient daughter called wanted order for Xray sent to [facility] Fax to [phone]

## 2019-04-07 NOTE — Telephone Encounter (Signed)
Pt daughter called back stating if you could fax over the orders the facility could have someone come do it instead of having to bring the patient out.

## 2019-04-10 ENCOUNTER — Telehealth: Payer: Self-pay | Admitting: Orthopaedic Surgery

## 2019-04-10 NOTE — Telephone Encounter (Signed)
Patient's daughter ([name]) called asked if she can get a call back with the results of her fathers X-rays. The number to contact [name] is [phone]

## 2019-04-10 NOTE — Telephone Encounter (Signed)
Daughter aware we can't actually see the facilities xrays

## 2019-04-14 DIAGNOSIS — R2689 Other abnormalities of gait and mobility: Secondary | ICD-10-CM | POA: Diagnosis not present

## 2019-04-19 ENCOUNTER — Other Ambulatory Visit: Payer: Self-pay

## 2019-04-19 ENCOUNTER — Encounter: Payer: Self-pay | Admitting: Orthopaedic Surgery

## 2019-04-19 ENCOUNTER — Ambulatory Visit (INDEPENDENT_AMBULATORY_CARE_PROVIDER_SITE_OTHER): Payer: PPO | Admitting: Orthopaedic Surgery

## 2019-04-19 DIAGNOSIS — Z96649 Presence of unspecified artificial hip joint: Secondary | ICD-10-CM

## 2019-04-19 NOTE — Progress Notes (Signed)
HPI: Anthony [name] returns today follow-up of his left hip hemiarthroplasty.  He is now 46 days postop.  He reports being up walking with a walker at the facility.  He did have a couple falls.  Had radiographs at that facility was 42 no fracture.  These films are not available.  Mainly here today for wound check.  Physical exam: General well-developed well-nourished male seated in wheelchair in no acute distress. Left hip: Good range of motion without significant pain.  Surgical incision with scabbing over the mid incision but no evidence of infection.  No dehiscence of the wound.  Left calf supple nontender.  Impression: 46 days status post left hip hemiarthroplasty  Plan: Continue daily wound care with the application of Bactroban daily after washing the wound.  Continue physical therapy for gait balance and strengthening.  Follow-up with [place] on as-needed basis.  Questions encouraged and answered both of the patient and his daughters present throughout the examination today.

## 2019-04-21 DIAGNOSIS — N400 Enlarged prostate without lower urinary tract symptoms: Secondary | ICD-10-CM | POA: Diagnosis not present

## 2019-04-21 DIAGNOSIS — R531 Weakness: Secondary | ICD-10-CM | POA: Diagnosis not present

## 2019-04-21 DIAGNOSIS — M069 Rheumatoid arthritis, unspecified: Secondary | ICD-10-CM | POA: Diagnosis not present

## 2019-04-21 DIAGNOSIS — I2510 Atherosclerotic heart disease of native coronary artery without angina pectoris: Secondary | ICD-10-CM | POA: Diagnosis not present

## 2019-04-22 ENCOUNTER — Telehealth: Payer: Self-pay

## 2019-04-22 NOTE — Telephone Encounter (Signed)
Call placed to [facility] in [place].  Spoke with nurse, Nira Conn.  Advised that pt. Was potentially exposed to an employee that has tested positive for COVID 19, at recent [facility] appt.  Offered that pt. can be tested at [facility] site today or tomorrow.  Per Nira Conn, she will need to contact her DON, and then call back to schedule.  Stated that outside transportation needs to be arranged.  Nira Conn was instructed to call back to [phone].  Verb. Understanding.

## 2019-04-22 NOTE — Telephone Encounter (Signed)
Spoke with wife, states that pt is in a nursing home. [facility]. [place] notified of potential nursing home exposure and to f/u on poc and testing.

## 2019-04-24 DIAGNOSIS — Z20828 Contact with and (suspected) exposure to other viral communicable diseases: Secondary | ICD-10-CM | POA: Diagnosis not present

## 2019-04-25 DIAGNOSIS — R2689 Other abnormalities of gait and mobility: Secondary | ICD-10-CM | POA: Diagnosis not present

## 2019-04-27 DIAGNOSIS — I110 Hypertensive heart disease with heart failure: Secondary | ICD-10-CM | POA: Diagnosis not present

## 2019-04-27 DIAGNOSIS — I10 Essential (primary) hypertension: Secondary | ICD-10-CM | POA: Diagnosis not present

## 2019-04-27 DIAGNOSIS — R609 Edema, unspecified: Secondary | ICD-10-CM | POA: Diagnosis not present

## 2019-04-27 DIAGNOSIS — E119 Type 2 diabetes mellitus without complications: Secondary | ICD-10-CM | POA: Diagnosis not present

## 2019-05-01 DIAGNOSIS — I5022 Chronic systolic (congestive) heart failure: Secondary | ICD-10-CM | POA: Diagnosis not present

## 2019-05-01 DIAGNOSIS — D649 Anemia, unspecified: Secondary | ICD-10-CM | POA: Diagnosis not present

## 2019-05-11 DIAGNOSIS — I2510 Atherosclerotic heart disease of native coronary artery without angina pectoris: Secondary | ICD-10-CM | POA: Diagnosis not present

## 2019-05-11 DIAGNOSIS — I110 Hypertensive heart disease with heart failure: Secondary | ICD-10-CM | POA: Diagnosis not present

## 2019-05-11 DIAGNOSIS — I10 Essential (primary) hypertension: Secondary | ICD-10-CM | POA: Diagnosis not present

## 2019-05-11 DIAGNOSIS — R609 Edema, unspecified: Secondary | ICD-10-CM | POA: Diagnosis not present

## 2019-05-19 DIAGNOSIS — R627 Adult failure to thrive: Secondary | ICD-10-CM | POA: Diagnosis not present

## 2019-05-19 DIAGNOSIS — I5022 Chronic systolic (congestive) heart failure: Secondary | ICD-10-CM | POA: Diagnosis not present

## 2019-05-19 DIAGNOSIS — E119 Type 2 diabetes mellitus without complications: Secondary | ICD-10-CM | POA: Diagnosis not present

## 2019-05-19 DIAGNOSIS — I10 Essential (primary) hypertension: Secondary | ICD-10-CM | POA: Diagnosis not present

## 2019-06-08 DIAGNOSIS — R627 Adult failure to thrive: Secondary | ICD-10-CM | POA: Diagnosis not present

## 2019-06-08 DIAGNOSIS — I10 Essential (primary) hypertension: Secondary | ICD-10-CM | POA: Diagnosis not present

## 2019-06-08 DIAGNOSIS — E119 Type 2 diabetes mellitus without complications: Secondary | ICD-10-CM | POA: Diagnosis not present

## 2019-06-08 DIAGNOSIS — I5022 Chronic systolic (congestive) heart failure: Secondary | ICD-10-CM | POA: Diagnosis not present

## 2019-06-12 DIAGNOSIS — I5022 Chronic systolic (congestive) heart failure: Secondary | ICD-10-CM | POA: Diagnosis not present

## 2019-06-12 DIAGNOSIS — I10 Essential (primary) hypertension: Secondary | ICD-10-CM | POA: Diagnosis not present

## 2019-06-12 DIAGNOSIS — E119 Type 2 diabetes mellitus without complications: Secondary | ICD-10-CM | POA: Diagnosis not present

## 2019-06-12 DIAGNOSIS — R627 Adult failure to thrive: Secondary | ICD-10-CM | POA: Diagnosis not present

## 2019-06-13 DIAGNOSIS — U071 COVID-19: Secondary | ICD-10-CM | POA: Diagnosis not present

## 2019-07-12 DIAGNOSIS — E119 Type 2 diabetes mellitus without complications: Secondary | ICD-10-CM | POA: Diagnosis not present

## 2019-07-12 DIAGNOSIS — I5022 Chronic systolic (congestive) heart failure: Secondary | ICD-10-CM | POA: Diagnosis not present

## 2019-07-12 DIAGNOSIS — R627 Adult failure to thrive: Secondary | ICD-10-CM | POA: Diagnosis not present

## 2019-07-12 DIAGNOSIS — I10 Essential (primary) hypertension: Secondary | ICD-10-CM | POA: Diagnosis not present

## 2019-07-13 DIAGNOSIS — I110 Hypertensive heart disease with heart failure: Secondary | ICD-10-CM | POA: Diagnosis not present

## 2019-07-13 DIAGNOSIS — E785 Hyperlipidemia, unspecified: Secondary | ICD-10-CM | POA: Diagnosis not present

## 2019-07-13 DIAGNOSIS — F0390 Unspecified dementia without behavioral disturbance: Secondary | ICD-10-CM | POA: Diagnosis not present

## 2019-07-13 DIAGNOSIS — I5022 Chronic systolic (congestive) heart failure: Secondary | ICD-10-CM | POA: Diagnosis not present

## 2019-08-03 DIAGNOSIS — I10 Essential (primary) hypertension: Secondary | ICD-10-CM | POA: Diagnosis not present

## 2019-08-03 DIAGNOSIS — E119 Type 2 diabetes mellitus without complications: Secondary | ICD-10-CM | POA: Diagnosis not present

## 2019-08-03 DIAGNOSIS — E785 Hyperlipidemia, unspecified: Secondary | ICD-10-CM | POA: Diagnosis not present

## 2019-08-03 DIAGNOSIS — I110 Hypertensive heart disease with heart failure: Secondary | ICD-10-CM | POA: Diagnosis not present

## 2019-08-09 DIAGNOSIS — I5022 Chronic systolic (congestive) heart failure: Secondary | ICD-10-CM | POA: Diagnosis not present

## 2019-08-09 DIAGNOSIS — E785 Hyperlipidemia, unspecified: Secondary | ICD-10-CM | POA: Diagnosis not present

## 2019-08-09 DIAGNOSIS — I110 Hypertensive heart disease with heart failure: Secondary | ICD-10-CM | POA: Diagnosis not present

## 2019-08-09 DIAGNOSIS — F0390 Unspecified dementia without behavioral disturbance: Secondary | ICD-10-CM | POA: Diagnosis not present

## 2019-08-10 DIAGNOSIS — R41841 Cognitive communication deficit: Secondary | ICD-10-CM | POA: Diagnosis not present

## 2019-08-14 DIAGNOSIS — U071 COVID-19: Secondary | ICD-10-CM | POA: Diagnosis not present

## 2019-08-24 DIAGNOSIS — R41841 Cognitive communication deficit: Secondary | ICD-10-CM | POA: Diagnosis not present

## 2019-08-24 DIAGNOSIS — U071 COVID-19: Secondary | ICD-10-CM | POA: Diagnosis not present

## 2019-08-31 DIAGNOSIS — E119 Type 2 diabetes mellitus without complications: Secondary | ICD-10-CM | POA: Diagnosis not present

## 2019-08-31 DIAGNOSIS — I5022 Chronic systolic (congestive) heart failure: Secondary | ICD-10-CM | POA: Diagnosis not present

## 2019-08-31 DIAGNOSIS — R531 Weakness: Secondary | ICD-10-CM | POA: Diagnosis not present

## 2019-08-31 DIAGNOSIS — N400 Enlarged prostate without lower urinary tract symptoms: Secondary | ICD-10-CM | POA: Diagnosis not present

## 2019-09-04 DIAGNOSIS — E785 Hyperlipidemia, unspecified: Secondary | ICD-10-CM | POA: Diagnosis not present

## 2019-09-04 DIAGNOSIS — I110 Hypertensive heart disease with heart failure: Secondary | ICD-10-CM | POA: Diagnosis not present

## 2019-09-04 DIAGNOSIS — F0390 Unspecified dementia without behavioral disturbance: Secondary | ICD-10-CM | POA: Diagnosis not present

## 2019-09-04 DIAGNOSIS — I5022 Chronic systolic (congestive) heart failure: Secondary | ICD-10-CM | POA: Diagnosis not present

## 2019-09-13 DIAGNOSIS — U071 COVID-19: Secondary | ICD-10-CM | POA: Diagnosis not present

## 2019-09-26 DIAGNOSIS — U071 COVID-19: Secondary | ICD-10-CM | POA: Diagnosis not present

## 2019-09-28 DIAGNOSIS — I5022 Chronic systolic (congestive) heart failure: Secondary | ICD-10-CM | POA: Diagnosis not present

## 2019-09-28 DIAGNOSIS — I110 Hypertensive heart disease with heart failure: Secondary | ICD-10-CM | POA: Diagnosis not present

## 2019-09-28 DIAGNOSIS — F0390 Unspecified dementia without behavioral disturbance: Secondary | ICD-10-CM | POA: Diagnosis not present

## 2019-09-28 DIAGNOSIS — E785 Hyperlipidemia, unspecified: Secondary | ICD-10-CM | POA: Diagnosis not present

## 2019-09-29 DIAGNOSIS — U071 COVID-19: Secondary | ICD-10-CM | POA: Diagnosis not present

## 2019-10-04 DIAGNOSIS — U071 COVID-19: Secondary | ICD-10-CM | POA: Diagnosis not present

## 2019-10-11 DIAGNOSIS — R531 Weakness: Secondary | ICD-10-CM | POA: Diagnosis not present

## 2019-10-11 DIAGNOSIS — I5022 Chronic systolic (congestive) heart failure: Secondary | ICD-10-CM | POA: Diagnosis not present

## 2019-10-11 DIAGNOSIS — L219 Seborrheic dermatitis, unspecified: Secondary | ICD-10-CM | POA: Diagnosis not present

## 2019-10-11 DIAGNOSIS — I110 Hypertensive heart disease with heart failure: Secondary | ICD-10-CM | POA: Diagnosis not present

## 2019-10-11 DIAGNOSIS — U071 COVID-19: Secondary | ICD-10-CM | POA: Diagnosis not present

## 2019-10-14 DIAGNOSIS — R41841 Cognitive communication deficit: Secondary | ICD-10-CM | POA: Diagnosis not present

## 2019-10-16 DIAGNOSIS — U071 COVID-19: Secondary | ICD-10-CM | POA: Diagnosis not present

## 2019-10-24 DIAGNOSIS — M6281 Muscle weakness (generalized): Secondary | ICD-10-CM | POA: Diagnosis not present

## 2019-10-24 DIAGNOSIS — U071 COVID-19: Secondary | ICD-10-CM | POA: Diagnosis not present

## 2019-10-24 DIAGNOSIS — R41841 Cognitive communication deficit: Secondary | ICD-10-CM | POA: Diagnosis not present

## 2019-10-26 DIAGNOSIS — E785 Hyperlipidemia, unspecified: Secondary | ICD-10-CM | POA: Diagnosis not present

## 2019-10-26 DIAGNOSIS — I10 Essential (primary) hypertension: Secondary | ICD-10-CM | POA: Diagnosis not present

## 2019-10-26 DIAGNOSIS — F0390 Unspecified dementia without behavioral disturbance: Secondary | ICD-10-CM | POA: Diagnosis not present

## 2019-10-26 DIAGNOSIS — I110 Hypertensive heart disease with heart failure: Secondary | ICD-10-CM | POA: Diagnosis not present

## 2019-10-31 DIAGNOSIS — U071 COVID-19: Secondary | ICD-10-CM | POA: Diagnosis not present

## 2019-11-07 DIAGNOSIS — U071 COVID-19: Secondary | ICD-10-CM | POA: Diagnosis not present

## 2019-11-08 DIAGNOSIS — F0390 Unspecified dementia without behavioral disturbance: Secondary | ICD-10-CM | POA: Diagnosis not present

## 2019-11-08 DIAGNOSIS — I5022 Chronic systolic (congestive) heart failure: Secondary | ICD-10-CM | POA: Diagnosis not present

## 2019-11-08 DIAGNOSIS — L219 Seborrheic dermatitis, unspecified: Secondary | ICD-10-CM | POA: Diagnosis not present

## 2019-11-08 DIAGNOSIS — I110 Hypertensive heart disease with heart failure: Secondary | ICD-10-CM | POA: Diagnosis not present

## 2019-11-14 DIAGNOSIS — U071 COVID-19: Secondary | ICD-10-CM | POA: Diagnosis not present

## 2019-11-21 DIAGNOSIS — U071 COVID-19: Secondary | ICD-10-CM | POA: Diagnosis not present

## 2019-11-23 DIAGNOSIS — L219 Seborrheic dermatitis, unspecified: Secondary | ICD-10-CM | POA: Diagnosis not present

## 2019-11-23 DIAGNOSIS — E119 Type 2 diabetes mellitus without complications: Secondary | ICD-10-CM | POA: Diagnosis not present

## 2019-11-23 DIAGNOSIS — I5022 Chronic systolic (congestive) heart failure: Secondary | ICD-10-CM | POA: Diagnosis not present

## 2019-11-23 DIAGNOSIS — I110 Hypertensive heart disease with heart failure: Secondary | ICD-10-CM | POA: Diagnosis not present

## 2019-11-24 DIAGNOSIS — R41841 Cognitive communication deficit: Secondary | ICD-10-CM | POA: Diagnosis not present

## 2019-11-24 DIAGNOSIS — M6281 Muscle weakness (generalized): Secondary | ICD-10-CM | POA: Diagnosis not present

## 2019-11-27 DIAGNOSIS — U071 COVID-19: Secondary | ICD-10-CM | POA: Diagnosis not present

## 2019-12-01 DIAGNOSIS — U071 COVID-19: Secondary | ICD-10-CM | POA: Diagnosis not present

## 2019-12-05 DIAGNOSIS — U071 COVID-19: Secondary | ICD-10-CM | POA: Diagnosis not present

## 2019-12-06 DIAGNOSIS — E785 Hyperlipidemia, unspecified: Secondary | ICD-10-CM | POA: Diagnosis not present

## 2019-12-06 DIAGNOSIS — F0390 Unspecified dementia without behavioral disturbance: Secondary | ICD-10-CM | POA: Diagnosis not present

## 2019-12-06 DIAGNOSIS — I110 Hypertensive heart disease with heart failure: Secondary | ICD-10-CM | POA: Diagnosis not present

## 2019-12-06 DIAGNOSIS — I5022 Chronic systolic (congestive) heart failure: Secondary | ICD-10-CM | POA: Diagnosis not present

## 2019-12-11 DIAGNOSIS — U071 COVID-19: Secondary | ICD-10-CM | POA: Diagnosis not present

## 2019-12-13 DIAGNOSIS — U071 COVID-19: Secondary | ICD-10-CM | POA: Diagnosis not present

## 2019-12-19 DIAGNOSIS — U071 COVID-19: Secondary | ICD-10-CM | POA: Diagnosis not present

## 2019-12-21 DIAGNOSIS — I5022 Chronic systolic (congestive) heart failure: Secondary | ICD-10-CM | POA: Diagnosis not present

## 2019-12-21 DIAGNOSIS — N3281 Overactive bladder: Secondary | ICD-10-CM | POA: Diagnosis not present

## 2019-12-21 DIAGNOSIS — I110 Hypertensive heart disease with heart failure: Secondary | ICD-10-CM | POA: Diagnosis not present

## 2019-12-21 DIAGNOSIS — E785 Hyperlipidemia, unspecified: Secondary | ICD-10-CM | POA: Diagnosis not present

## 2019-12-25 DIAGNOSIS — M6281 Muscle weakness (generalized): Secondary | ICD-10-CM | POA: Diagnosis not present

## 2019-12-26 DIAGNOSIS — U071 COVID-19: Secondary | ICD-10-CM | POA: Diagnosis not present

## 2020-01-10 DIAGNOSIS — N400 Enlarged prostate without lower urinary tract symptoms: Secondary | ICD-10-CM | POA: Diagnosis not present

## 2020-01-10 DIAGNOSIS — F0390 Unspecified dementia without behavioral disturbance: Secondary | ICD-10-CM | POA: Diagnosis not present

## 2020-01-10 DIAGNOSIS — I5022 Chronic systolic (congestive) heart failure: Secondary | ICD-10-CM | POA: Diagnosis not present

## 2020-01-10 DIAGNOSIS — I110 Hypertensive heart disease with heart failure: Secondary | ICD-10-CM | POA: Diagnosis not present

## 2020-01-12 DIAGNOSIS — G250 Essential tremor: Secondary | ICD-10-CM | POA: Diagnosis not present

## 2020-01-18 DIAGNOSIS — I5022 Chronic systolic (congestive) heart failure: Secondary | ICD-10-CM | POA: Diagnosis not present

## 2020-01-18 DIAGNOSIS — I110 Hypertensive heart disease with heart failure: Secondary | ICD-10-CM | POA: Diagnosis not present

## 2020-01-18 DIAGNOSIS — F0390 Unspecified dementia without behavioral disturbance: Secondary | ICD-10-CM | POA: Diagnosis not present

## 2020-01-18 DIAGNOSIS — E785 Hyperlipidemia, unspecified: Secondary | ICD-10-CM | POA: Diagnosis not present

## 2020-02-07 DIAGNOSIS — F0390 Unspecified dementia without behavioral disturbance: Secondary | ICD-10-CM | POA: Diagnosis not present

## 2020-02-07 DIAGNOSIS — I5022 Chronic systolic (congestive) heart failure: Secondary | ICD-10-CM | POA: Diagnosis not present

## 2020-02-07 DIAGNOSIS — N3281 Overactive bladder: Secondary | ICD-10-CM | POA: Diagnosis not present

## 2020-02-07 DIAGNOSIS — N400 Enlarged prostate without lower urinary tract symptoms: Secondary | ICD-10-CM | POA: Diagnosis not present

## 2020-02-15 DIAGNOSIS — E785 Hyperlipidemia, unspecified: Secondary | ICD-10-CM | POA: Diagnosis not present

## 2020-02-15 DIAGNOSIS — F0390 Unspecified dementia without behavioral disturbance: Secondary | ICD-10-CM | POA: Diagnosis not present

## 2020-02-15 DIAGNOSIS — I5022 Chronic systolic (congestive) heart failure: Secondary | ICD-10-CM | POA: Diagnosis not present

## 2020-02-15 DIAGNOSIS — N400 Enlarged prostate without lower urinary tract symptoms: Secondary | ICD-10-CM | POA: Diagnosis not present

## 2020-03-13 DIAGNOSIS — I2510 Atherosclerotic heart disease of native coronary artery without angina pectoris: Secondary | ICD-10-CM | POA: Diagnosis not present

## 2020-03-13 DIAGNOSIS — I5022 Chronic systolic (congestive) heart failure: Secondary | ICD-10-CM | POA: Diagnosis not present

## 2020-03-13 DIAGNOSIS — N400 Enlarged prostate without lower urinary tract symptoms: Secondary | ICD-10-CM | POA: Diagnosis not present

## 2020-03-13 DIAGNOSIS — I110 Hypertensive heart disease with heart failure: Secondary | ICD-10-CM | POA: Diagnosis not present

## 2020-03-14 DIAGNOSIS — I5022 Chronic systolic (congestive) heart failure: Secondary | ICD-10-CM | POA: Diagnosis not present

## 2020-03-14 DIAGNOSIS — I5023 Acute on chronic systolic (congestive) heart failure: Secondary | ICD-10-CM | POA: Diagnosis not present

## 2020-03-14 DIAGNOSIS — D649 Anemia, unspecified: Secondary | ICD-10-CM | POA: Diagnosis not present

## 2020-03-14 DIAGNOSIS — I110 Hypertensive heart disease with heart failure: Secondary | ICD-10-CM | POA: Diagnosis not present

## 2020-03-14 DIAGNOSIS — I2510 Atherosclerotic heart disease of native coronary artery without angina pectoris: Secondary | ICD-10-CM | POA: Diagnosis not present

## 2020-03-18 DIAGNOSIS — I2510 Atherosclerotic heart disease of native coronary artery without angina pectoris: Secondary | ICD-10-CM | POA: Diagnosis not present

## 2020-03-18 DIAGNOSIS — N400 Enlarged prostate without lower urinary tract symptoms: Secondary | ICD-10-CM | POA: Diagnosis not present

## 2020-03-18 DIAGNOSIS — D649 Anemia, unspecified: Secondary | ICD-10-CM | POA: Diagnosis not present

## 2020-03-18 DIAGNOSIS — I10 Essential (primary) hypertension: Secondary | ICD-10-CM | POA: Diagnosis not present

## 2020-03-18 DIAGNOSIS — I110 Hypertensive heart disease with heart failure: Secondary | ICD-10-CM | POA: Diagnosis not present

## 2020-03-18 DIAGNOSIS — I5023 Acute on chronic systolic (congestive) heart failure: Secondary | ICD-10-CM | POA: Diagnosis not present

## 2020-03-20 DIAGNOSIS — E785 Hyperlipidemia, unspecified: Secondary | ICD-10-CM | POA: Diagnosis not present

## 2020-03-20 DIAGNOSIS — I5023 Acute on chronic systolic (congestive) heart failure: Secondary | ICD-10-CM | POA: Diagnosis not present

## 2020-03-20 DIAGNOSIS — I2510 Atherosclerotic heart disease of native coronary artery without angina pectoris: Secondary | ICD-10-CM | POA: Diagnosis not present

## 2020-03-20 DIAGNOSIS — I110 Hypertensive heart disease with heart failure: Secondary | ICD-10-CM | POA: Diagnosis not present

## 2020-03-22 DIAGNOSIS — D649 Anemia, unspecified: Secondary | ICD-10-CM | POA: Diagnosis not present

## 2020-03-22 DIAGNOSIS — I110 Hypertensive heart disease with heart failure: Secondary | ICD-10-CM | POA: Diagnosis not present

## 2020-03-23 ENCOUNTER — Inpatient Hospital Stay (HOSPITAL_COMMUNITY)
Admission: EM | Admit: 2020-03-23 | Discharge: 2020-03-25 | DRG: 871 | Disposition: A | Discharge status: Skilled Nursing Facility | Payer: PPO | Source: Skilled Nursing Facility | Attending: Family Medicine | Admitting: Family Medicine

## 2020-03-23 ENCOUNTER — Other Ambulatory Visit: Payer: Self-pay

## 2020-03-23 ENCOUNTER — Inpatient Hospital Stay (HOSPITAL_COMMUNITY): Payer: PPO

## 2020-03-23 ENCOUNTER — Emergency Department (HOSPITAL_COMMUNITY): Payer: PPO

## 2020-03-23 ENCOUNTER — Encounter (HOSPITAL_COMMUNITY): Payer: Self-pay

## 2020-03-23 DIAGNOSIS — F0390 Unspecified dementia without behavioral disturbance: Secondary | ICD-10-CM | POA: Diagnosis present

## 2020-03-23 DIAGNOSIS — N179 Acute kidney failure, unspecified: Secondary | ICD-10-CM | POA: Diagnosis present

## 2020-03-23 DIAGNOSIS — S50312A Abrasion of left elbow, initial encounter: Secondary | ICD-10-CM | POA: Diagnosis not present

## 2020-03-23 DIAGNOSIS — E119 Type 2 diabetes mellitus without complications: Secondary | ICD-10-CM | POA: Diagnosis present

## 2020-03-23 DIAGNOSIS — Z794 Long term (current) use of insulin: Secondary | ICD-10-CM

## 2020-03-23 DIAGNOSIS — I499 Cardiac arrhythmia, unspecified: Secondary | ICD-10-CM | POA: Diagnosis not present

## 2020-03-23 DIAGNOSIS — E7800 Pure hypercholesterolemia, unspecified: Secondary | ICD-10-CM | POA: Diagnosis not present

## 2020-03-23 DIAGNOSIS — T380X5A Adverse effect of glucocorticoids and synthetic analogues, initial encounter: Secondary | ICD-10-CM | POA: Diagnosis not present

## 2020-03-23 DIAGNOSIS — R197 Diarrhea, unspecified: Secondary | ICD-10-CM | POA: Diagnosis not present

## 2020-03-23 DIAGNOSIS — I472 Ventricular tachycardia: Secondary | ICD-10-CM | POA: Diagnosis not present

## 2020-03-23 DIAGNOSIS — Z66 Do not resuscitate: Secondary | ICD-10-CM | POA: Diagnosis present

## 2020-03-23 DIAGNOSIS — S0083XA Contusion of other part of head, initial encounter: Secondary | ICD-10-CM | POA: Diagnosis not present

## 2020-03-23 DIAGNOSIS — K219 Gastro-esophageal reflux disease without esophagitis: Secondary | ICD-10-CM | POA: Diagnosis present

## 2020-03-23 DIAGNOSIS — Z20822 Contact with and (suspected) exposure to covid-19: Secondary | ICD-10-CM | POA: Diagnosis present

## 2020-03-23 DIAGNOSIS — S299XXA Unspecified injury of thorax, initial encounter: Secondary | ICD-10-CM | POA: Diagnosis not present

## 2020-03-23 DIAGNOSIS — Y92129 Unspecified place in nursing home as the place of occurrence of the external cause: Secondary | ICD-10-CM | POA: Diagnosis not present

## 2020-03-23 DIAGNOSIS — R0689 Other abnormalities of breathing: Secondary | ICD-10-CM | POA: Diagnosis not present

## 2020-03-23 DIAGNOSIS — J9601 Acute respiratory failure with hypoxia: Secondary | ICD-10-CM | POA: Diagnosis present

## 2020-03-23 DIAGNOSIS — J8410 Pulmonary fibrosis, unspecified: Secondary | ICD-10-CM | POA: Diagnosis present

## 2020-03-23 DIAGNOSIS — I361 Nonrheumatic tricuspid (valve) insufficiency: Secondary | ICD-10-CM | POA: Diagnosis not present

## 2020-03-23 DIAGNOSIS — Z8249 Family history of ischemic heart disease and other diseases of the circulatory system: Secondary | ICD-10-CM

## 2020-03-23 DIAGNOSIS — K5010 Crohn's disease of large intestine without complications: Secondary | ICD-10-CM | POA: Diagnosis present

## 2020-03-23 DIAGNOSIS — I10 Essential (primary) hypertension: Secondary | ICD-10-CM | POA: Diagnosis not present

## 2020-03-23 DIAGNOSIS — I4891 Unspecified atrial fibrillation: Secondary | ICD-10-CM | POA: Diagnosis present

## 2020-03-23 DIAGNOSIS — I700 Atherosclerosis of aorta: Secondary | ICD-10-CM | POA: Diagnosis present

## 2020-03-23 DIAGNOSIS — A084 Viral intestinal infection, unspecified: Secondary | ICD-10-CM | POA: Diagnosis not present

## 2020-03-23 DIAGNOSIS — R296 Repeated falls: Secondary | ICD-10-CM | POA: Diagnosis not present

## 2020-03-23 DIAGNOSIS — E876 Hypokalemia: Secondary | ICD-10-CM | POA: Diagnosis present

## 2020-03-23 DIAGNOSIS — I110 Hypertensive heart disease with heart failure: Secondary | ICD-10-CM | POA: Diagnosis not present

## 2020-03-23 DIAGNOSIS — S22080A Wedge compression fracture of T11-T12 vertebra, initial encounter for closed fracture: Secondary | ICD-10-CM | POA: Diagnosis not present

## 2020-03-23 DIAGNOSIS — M069 Rheumatoid arthritis, unspecified: Secondary | ICD-10-CM | POA: Diagnosis not present

## 2020-03-23 DIAGNOSIS — B370 Candidal stomatitis: Secondary | ICD-10-CM | POA: Diagnosis present

## 2020-03-23 DIAGNOSIS — W19XXXA Unspecified fall, initial encounter: Secondary | ICD-10-CM | POA: Diagnosis present

## 2020-03-23 DIAGNOSIS — K50119 Crohn's disease of large intestine with unspecified complications: Secondary | ICD-10-CM | POA: Diagnosis not present

## 2020-03-23 DIAGNOSIS — I5022 Chronic systolic (congestive) heart failure: Secondary | ICD-10-CM | POA: Diagnosis present

## 2020-03-23 DIAGNOSIS — Z7952 Long term (current) use of systemic steroids: Secondary | ICD-10-CM

## 2020-03-23 DIAGNOSIS — S0990XA Unspecified injury of head, initial encounter: Secondary | ICD-10-CM | POA: Diagnosis not present

## 2020-03-23 DIAGNOSIS — S0993XA Unspecified injury of face, initial encounter: Secondary | ICD-10-CM | POA: Diagnosis not present

## 2020-03-23 DIAGNOSIS — R410 Disorientation, unspecified: Secondary | ICD-10-CM | POA: Diagnosis not present

## 2020-03-23 DIAGNOSIS — E785 Hyperlipidemia, unspecified: Secondary | ICD-10-CM | POA: Diagnosis present

## 2020-03-23 DIAGNOSIS — Z7982 Long term (current) use of aspirin: Secondary | ICD-10-CM

## 2020-03-23 DIAGNOSIS — M19011 Primary osteoarthritis, right shoulder: Secondary | ICD-10-CM | POA: Diagnosis not present

## 2020-03-23 DIAGNOSIS — E872 Acidosis: Secondary | ICD-10-CM | POA: Diagnosis present

## 2020-03-23 DIAGNOSIS — S199XXA Unspecified injury of neck, initial encounter: Secondary | ICD-10-CM | POA: Diagnosis not present

## 2020-03-23 DIAGNOSIS — A419 Sepsis, unspecified organism: Principal | ICD-10-CM | POA: Diagnosis present

## 2020-03-23 DIAGNOSIS — I2510 Atherosclerotic heart disease of native coronary artery without angina pectoris: Secondary | ICD-10-CM | POA: Diagnosis present

## 2020-03-23 DIAGNOSIS — R109 Unspecified abdominal pain: Secondary | ICD-10-CM | POA: Diagnosis not present

## 2020-03-23 DIAGNOSIS — Z96642 Presence of left artificial hip joint: Secondary | ICD-10-CM | POA: Diagnosis present

## 2020-03-23 DIAGNOSIS — I252 Old myocardial infarction: Secondary | ICD-10-CM | POA: Diagnosis not present

## 2020-03-23 DIAGNOSIS — Z87891 Personal history of nicotine dependence: Secondary | ICD-10-CM

## 2020-03-23 DIAGNOSIS — R404 Transient alteration of awareness: Secondary | ICD-10-CM | POA: Diagnosis not present

## 2020-03-23 DIAGNOSIS — M25511 Pain in right shoulder: Secondary | ICD-10-CM

## 2020-03-23 DIAGNOSIS — G4730 Sleep apnea, unspecified: Secondary | ICD-10-CM | POA: Diagnosis present

## 2020-03-23 DIAGNOSIS — I350 Nonrheumatic aortic (valve) stenosis: Secondary | ICD-10-CM | POA: Diagnosis not present

## 2020-03-23 DIAGNOSIS — K760 Fatty (change of) liver, not elsewhere classified: Secondary | ICD-10-CM | POA: Diagnosis present

## 2020-03-23 DIAGNOSIS — R531 Weakness: Secondary | ICD-10-CM | POA: Diagnosis not present

## 2020-03-23 DIAGNOSIS — R0902 Hypoxemia: Secondary | ICD-10-CM | POA: Diagnosis not present

## 2020-03-23 DIAGNOSIS — Z825 Family history of asthma and other chronic lower respiratory diseases: Secondary | ICD-10-CM

## 2020-03-23 DIAGNOSIS — Z8261 Family history of arthritis: Secondary | ICD-10-CM

## 2020-03-23 DIAGNOSIS — S59902A Unspecified injury of left elbow, initial encounter: Secondary | ICD-10-CM | POA: Diagnosis not present

## 2020-03-23 LAB — COMPREHENSIVE METABOLIC PANEL
ALT: 20 U/L (ref 0–44)
AST: 26 U/L (ref 15–41)
Albumin: 3.6 g/dL (ref 3.5–5.0)
Alkaline Phosphatase: 64 U/L (ref 38–126)
Anion gap: 17 — ABNORMAL HIGH (ref 5–15)
BUN: 18 mg/dL (ref 8–23)
CO2: 24 mmol/L (ref 22–32)
Calcium: 8.8 mg/dL — ABNORMAL LOW (ref 8.9–10.3)
Chloride: 97 mmol/L — ABNORMAL LOW (ref 98–111)
Creatinine, Ser: 1.2 mg/dL (ref 0.61–1.24)
GFR calc Af Amer: >60 mL/min (ref >60)
GFR calc non Af Amer: 55 mL/min — ABNORMAL LOW (ref >60)
Glucose, Bld: 153 mg/dL — ABNORMAL HIGH (ref 70–99)
Potassium: 3.2 mmol/L — ABNORMAL LOW (ref 3.5–5.1)
Sodium: 138 mmol/L (ref 135–145)
Total Bilirubin: 1.3 mg/dL — ABNORMAL HIGH (ref 0.3–1.2)
Total Protein: 7 g/dL (ref 6.5–8.1)

## 2020-03-23 LAB — RESPIRATORY PANEL BY RT PCR (FLU A&B, COVID)
Influenza A by PCR: NEGATIVE
Influenza B by PCR: NEGATIVE
SARS Coronavirus 2 by RT PCR: NEGATIVE

## 2020-03-23 LAB — CBC WITH DIFFERENTIAL/PLATELET
Abs Immature Granulocytes: 0.1 10*3/uL — ABNORMAL HIGH (ref 0.00–0.07)
Basophils Absolute: 0 10*3/uL (ref 0.0–0.1)
Basophils Relative: 0 %
Eosinophils Absolute: 0.1 10*3/uL (ref 0.0–0.5)
Eosinophils Relative: 1 %
HCT: 52.3 % — ABNORMAL HIGH (ref 39.0–52.0)
Hemoglobin: 17.2 g/dL — ABNORMAL HIGH (ref 13.0–17.0)
Immature Granulocytes: 1 %
Lymphocytes Relative: 4 %
Lymphs Abs: 0.5 10*3/uL — ABNORMAL LOW (ref 0.7–4.0)
MCH: 29.9 pg (ref 26.0–34.0)
MCHC: 32.9 g/dL (ref 30.0–36.0)
MCV: 90.8 fL (ref 80.0–100.0)
Monocytes Absolute: 0.6 10*3/uL (ref 0.1–1.0)
Monocytes Relative: 4 %
Neutro Abs: 13.4 10*3/uL — ABNORMAL HIGH (ref 1.7–7.7)
Neutrophils Relative %: 90 %
Platelets: 231 10*3/uL (ref 150–400)
RBC: 5.76 MIL/uL (ref 4.22–5.81)
RDW: 15.2 % (ref 11.5–15.5)
WBC: 14.8 10*3/uL — ABNORMAL HIGH (ref 4.0–10.5)
nRBC: 0.1 % (ref 0.0–0.2)

## 2020-03-23 LAB — URINALYSIS, ROUTINE W REFLEX MICROSCOPIC
Bilirubin Urine: NEGATIVE
Glucose, UA: NEGATIVE mg/dL
Hgb urine dipstick: NEGATIVE
Ketones, ur: 5 mg/dL — AB
Leukocytes,Ua: NEGATIVE
Nitrite: NEGATIVE
Protein, ur: NEGATIVE mg/dL
Specific Gravity, Urine: >1.046 — ABNORMAL HIGH (ref 1.005–1.030)
pH: 6 (ref 5.0–8.0)

## 2020-03-23 LAB — C DIFFICILE QUICK SCREEN W PCR REFLEX
C Diff antigen: NEGATIVE
C Diff interpretation: NOT DETECTED
C Diff toxin: NEGATIVE

## 2020-03-23 LAB — LACTIC ACID, PLASMA
Lactic Acid, Venous: 3.3 mmol/L (ref 0.5–1.9)
Lactic Acid, Venous: 3.4 mmol/L (ref 0.5–1.9)
Lactic Acid, Venous: 2.2 mmol/L (ref 0.5–1.9)
Lactic Acid, Venous: 2.2 mmol/L (ref 0.5–1.9)

## 2020-03-23 LAB — POC SARS CORONAVIRUS 2 AG -  ED: SARS Coronavirus 2 Ag: NEGATIVE

## 2020-03-23 LAB — PROTIME-INR
INR: 1.1 (ref 0.8–1.2)
Prothrombin Time: 13.9 seconds (ref 11.4–15.2)

## 2020-03-23 LAB — APTT: aPTT: 28 seconds (ref 24–36)

## 2020-03-23 MED ORDER — FLUOXETINE HCL 10 MG PO CAPS
10.0000 mg | ORAL_CAPSULE | Freq: Every day | ORAL | Status: DC
  Administered 2020-03-23 – 2020-03-25 (×3): 10 mg via ORAL
  Filled 2020-03-23 (×4): qty 1

## 2020-03-23 MED ORDER — SODIUM CHLORIDE 0.9 % IV BOLUS
1000.0000 mL | Freq: Once | INTRAVENOUS | Status: AC
  Administered 2020-03-23: 1000 mL via INTRAVENOUS

## 2020-03-23 MED ORDER — ACETAMINOPHEN 500 MG PO TABS
1000.0000 mg | ORAL_TABLET | Freq: Once | ORAL | Status: DC
  Filled 2020-03-23: qty 2

## 2020-03-23 MED ORDER — IOHEXOL 300 MG/ML  SOLN
100.0000 mL | Freq: Once | INTRAMUSCULAR | Status: AC | PRN
  Administered 2020-03-23: 100 mL via INTRAVENOUS

## 2020-03-23 MED ORDER — SODIUM CHLORIDE 0.9 % IV BOLUS
500.0000 mL | Freq: Once | INTRAVENOUS | Status: AC
  Administered 2020-03-23: 500 mL via INTRAVENOUS

## 2020-03-23 MED ORDER — VANCOMYCIN HCL IN DEXTROSE 1-5 GM/200ML-% IV SOLN: 1000.0000 mg | Freq: Once | INTRAVENOUS | Status: DC

## 2020-03-23 MED ORDER — VANCOMYCIN HCL 1500 MG/300ML IV SOLN
1500.0000 mg | Freq: Once | INTRAVENOUS | Status: AC
  Administered 2020-03-23: 1500 mg via INTRAVENOUS
  Filled 2020-03-23 (×2): qty 300

## 2020-03-23 MED ORDER — PRAVASTATIN SODIUM 40 MG PO TABS
40.0000 mg | ORAL_TABLET | Freq: Every day | ORAL | Status: DC
  Administered 2020-03-23 – 2020-03-24 (×2): 40 mg via ORAL
  Filled 2020-03-23 (×2): qty 1

## 2020-03-23 MED ORDER — SODIUM CHLORIDE 0.9 % IV SOLN
2.0000 g | Freq: Once | INTRAVENOUS | Status: AC
  Administered 2020-03-23: 2 g via INTRAVENOUS
  Filled 2020-03-23: qty 2

## 2020-03-23 MED ORDER — ACETAMINOPHEN 500 MG PO TABS
1000.0000 mg | ORAL_TABLET | Freq: Once | ORAL | Status: AC
  Administered 2020-03-23: 1000 mg via ORAL
  Filled 2020-03-23: qty 2

## 2020-03-23 MED ORDER — VANCOMYCIN HCL IN DEXTROSE 1-5 GM/200ML-% IV SOLN
1000.0000 mg | Freq: Two times a day (BID) | INTRAVENOUS | Status: DC
  Administered 2020-03-24: 1000 mg via INTRAVENOUS
  Filled 2020-03-23 (×2): qty 200

## 2020-03-23 MED ORDER — PRIMIDONE 50 MG PO TABS
50.0000 mg | ORAL_TABLET | Freq: Every day | ORAL | Status: DC
  Administered 2020-03-23 – 2020-03-24 (×2): 50 mg via ORAL
  Filled 2020-03-23 (×3): qty 1

## 2020-03-23 MED ORDER — LORAZEPAM 0.5 MG PO TABS
0.5000 mg | ORAL_TABLET | ORAL | Status: DC
  Administered 2020-03-23 – 2020-03-24 (×3): 0.5 mg via ORAL
  Filled 2020-03-23 (×3): qty 1

## 2020-03-23 MED ORDER — SODIUM CHLORIDE 0.9 % IV SOLN: INTRAVENOUS | Status: DC

## 2020-03-23 MED ORDER — ACETAMINOPHEN 325 MG PO TABS
650.0000 mg | ORAL_TABLET | Freq: Four times a day (QID) | ORAL | Status: DC | PRN
  Administered 2020-03-24: 650 mg via ORAL
  Filled 2020-03-23: qty 2

## 2020-03-23 MED ORDER — MESALAMINE 1.2 G PO TBEC
2.4000 g | DELAYED_RELEASE_TABLET | Freq: Two times a day (BID) | ORAL | Status: DC
  Administered 2020-03-23 – 2020-03-25 (×4): 2.4 g via ORAL
  Filled 2020-03-23 (×5): qty 2

## 2020-03-23 MED ORDER — OXYBUTYNIN CHLORIDE 5 MG PO TABS
5.0000 mg | ORAL_TABLET | Freq: Two times a day (BID) | ORAL | Status: DC
  Administered 2020-03-23 – 2020-03-25 (×4): 5 mg via ORAL
  Filled 2020-03-23 (×4): qty 1

## 2020-03-23 MED ORDER — KETOCONAZOLE 2 % EX CREA
1.0000 "application " | TOPICAL_CREAM | Freq: Two times a day (BID) | CUTANEOUS | Status: DC
  Administered 2020-03-24 – 2020-03-25 (×4): 1 via TOPICAL
  Filled 2020-03-23: qty 15

## 2020-03-23 MED ORDER — HYDROCORTISONE NA SUCCINATE PF 100 MG IJ SOLR
100.0000 mg | Freq: Once | INTRAMUSCULAR | Status: AC
  Administered 2020-03-23: 100 mg via INTRAVENOUS
  Filled 2020-03-23: qty 2

## 2020-03-23 MED ORDER — ENOXAPARIN SODIUM 40 MG/0.4ML ~~LOC~~ SOLN
40.0000 mg | SUBCUTANEOUS | Status: DC
  Administered 2020-03-23 – 2020-03-24 (×2): 40 mg via SUBCUTANEOUS
  Filled 2020-03-23 (×2): qty 0.4

## 2020-03-23 MED ORDER — HYDROCODONE-ACETAMINOPHEN 5-325 MG PO TABS: 1.0000 | ORAL_TABLET | ORAL | Status: DC | PRN

## 2020-03-23 MED ORDER — METRONIDAZOLE IN NACL 5-0.79 MG/ML-% IV SOLN
500.0000 mg | Freq: Once | INTRAVENOUS | Status: AC
  Administered 2020-03-23: 500 mg via INTRAVENOUS
  Filled 2020-03-23: qty 100

## 2020-03-23 MED ORDER — PANTOPRAZOLE SODIUM 40 MG PO TBEC
40.0000 mg | DELAYED_RELEASE_TABLET | Freq: Every day | ORAL | Status: DC
  Administered 2020-03-23 – 2020-03-25 (×3): 40 mg via ORAL
  Filled 2020-03-23 (×3): qty 1

## 2020-03-23 MED ORDER — IPRATROPIUM-ALBUTEROL 0.5-2.5 (3) MG/3ML IN SOLN: 3.0000 mL | Freq: Four times a day (QID) | RESPIRATORY_TRACT | Status: DC | PRN

## 2020-03-23 MED ORDER — DONEPEZIL HCL 5 MG PO TABS
5.0000 mg | ORAL_TABLET | Freq: Every day | ORAL | Status: DC
  Administered 2020-03-23 – 2020-03-24 (×2): 5 mg via ORAL
  Filled 2020-03-23 (×3): qty 1

## 2020-03-23 MED ORDER — PREDNISONE 10 MG PO TABS
10.0000 mg | ORAL_TABLET | Freq: Every day | ORAL | Status: DC
  Administered 2020-03-23 – 2020-03-25 (×3): 10 mg via ORAL
  Filled 2020-03-23 (×3): qty 1

## 2020-03-23 MED ORDER — ONDANSETRON HCL 4 MG/2ML IJ SOLN: 4.0000 mg | Freq: Four times a day (QID) | INTRAMUSCULAR | Status: DC | PRN

## 2020-03-23 MED ORDER — SODIUM CHLORIDE 0.9 % IV SOLN
2.0000 g | Freq: Two times a day (BID) | INTRAVENOUS | Status: DC
  Administered 2020-03-23 – 2020-03-24 (×2): 2 g via INTRAVENOUS
  Filled 2020-03-23 (×2): qty 2

## 2020-03-23 MED ORDER — ACETAMINOPHEN 650 MG RE SUPP: 650.0000 mg | Freq: Four times a day (QID) | RECTAL | Status: DC | PRN

## 2020-03-23 MED ORDER — ASPIRIN 81 MG PO CHEW
81.0000 mg | CHEWABLE_TABLET | Freq: Two times a day (BID) | ORAL | Status: DC
  Administered 2020-03-23 – 2020-03-25 (×4): 81 mg via ORAL
  Filled 2020-03-23 (×4): qty 1

## 2020-03-23 MED ORDER — SODIUM CHLORIDE 0.9% FLUSH
3.0000 mL | Freq: Two times a day (BID) | INTRAVENOUS | Status: DC
  Administered 2020-03-23 – 2020-03-25 (×4): 3 mL via INTRAVENOUS

## 2020-03-23 MED ORDER — SODIUM FLUORIDE 1.1 % DT CREA: 1.0000 "application " | TOPICAL_CREAM | DENTAL | Status: DC

## 2020-03-23 MED ORDER — ONDANSETRON HCL 4 MG PO TABS: 4.0000 mg | ORAL_TABLET | Freq: Four times a day (QID) | ORAL | Status: DC | PRN

## 2020-03-23 MED ORDER — POTASSIUM CHLORIDE CRYS ER 20 MEQ PO TBCR
40.0000 meq | EXTENDED_RELEASE_TABLET | ORAL | Status: AC
  Administered 2020-03-23: 40 meq via ORAL
  Filled 2020-03-23: qty 2

## 2020-03-23 MED ORDER — TAMSULOSIN HCL 0.4 MG PO CAPS
0.4000 mg | ORAL_CAPSULE | Freq: Every day | ORAL | Status: DC
  Administered 2020-03-23 – 2020-03-24 (×2): 0.4 mg via ORAL
  Filled 2020-03-23 (×2): qty 1

## 2020-03-23 MED ORDER — IBUPROFEN 400 MG PO TABS
400.0000 mg | ORAL_TABLET | Freq: Once | ORAL | Status: AC
  Administered 2020-03-23: 400 mg via ORAL
  Filled 2020-03-23: qty 1

## 2020-03-23 NOTE — Progress Notes (Signed)
Notified provider of need to order repeat lactic acid. ° °

## 2020-03-23 NOTE — Progress Notes (Signed)
Pharmacy Antibiotic Note  Anthony [name] is a 86 y.o. male admitted on 03/23/2020 with unwitnessed fall. WBC elevated int the ED with lactate of 3.3 concerning for infection of unknown source. Pharmacy has been consulted for Cefepime and vancomycin dosing.   SCr 1.2  Plan: -Cefepime 2 gm IV Q 12 hours -Vancomycin 1500 mg IV load followed by vancomycin 1 gm IV Q 12 hours per traditional nomogram  -Monitor CBC, renal fx, cultures and clinical progress -VT at [facility]  -F/u chest x-ray. If concerning for pna, consider a MRSA PCR   Height: 5' 10"  (177.8 cm) Weight: 79.7 kg (175 lb 11.3 oz) IBW/kg (Calculated) : 73  Temp (24hrs), Avg:102.1 F (38.9 C), Min:101 F (38.3 C), Max:103.1 F (39.5 C)  Recent Labs  Lab 03/23/20 1022  WBC 14.8*    CrCl cannot be calculated (Patient's most recent lab result is older than the maximum 21 days allowed.).    No Known Allergies  Antimicrobials this admission: Vanc 5/1 >>  Cefepime 5/1 >>   Dose adjustments this admission:  Microbiology results: 5/1 BCx:  5/1 UCx:  5/1 C.diff >>   Thank you for allowing pharmacy to be a part of this patient's care.  Albertina Parr, PharmD., BCPS, BCCCP Clinical Pharmacist Clinical phone for 03/23/20 until 10pm: [phone] If after 10pm, please refer to [facility] for unit-specific pharmacist

## 2020-03-23 NOTE — H&P (Addendum)
History and Physical    [name]:[id number] DOB: [birth year] DOA: 03/23/2020  Referring MD/NP/PA: Domenic Moras, PA-C PCP: System, Pcp Not In  Patient coming from: [address] [facility] [place] via EMS  Chief Complaint: Unwitnessed fall  I have personally briefly reviewed patient's old medical records in [place]   HPI: Anthony [name] is a 86 y.o. male with medical history significant of hypertension, hyperlipidemia, coronary artery disease, Crohn's disease, and GERD who presents after having a unwitnessed fall in the bathroom.  Patient has poor short-term memory at baseline, but is able to recognize family.  History is obtained from review of records and the patient's daughter who is present at bedside.  Apparently there had been a GI bug going around the facility.  Started last on yesterday reported that she requested for him to have laxatives as she thought he was possibly constipated due to abdominal distention and complaints of abdominal pain.  Currently patient denies any reports of any pain.  At some point patient had had a large bowel movement as stool was reported all of the ground when EMS arrived today.  Daughter noted that he sustained significance skin tear to his left elbow and had bruising along the left side of his face.  He had not been on any antibiotics recently, but had been started on IV Lasix on 4/27 for lower extremity swelling in addition to his Lasix 40 mg p.o. that he normally takes daily.  ED Course: Upon admission into the emergency department patient was seen to be febrile up to 103.1 F, respiration 20-31, heart rate 81-161, and O2 saturations currently maintained on 2 L of nasal cannula oxygen.  Labs significant for WBC 14.8, hemoglobin 17.2, potassium 3.2, BUN 18, creatinine 1.2, and lactic acid 3.3->3.4.  CT imaging of the head and neck did not note any acute abnormalities or fractures.  CT imaging of the abdomen and pelvis was consistent with a diarrheal illness.  C. difficile screen negative.  Influenza and COVID-19 screening negative.  Patient had been given boluses of IV fluids in addition to empiric antibiotics of vancomycin, cefepime, and metronidazole.  Review of Systems  Unable to perform ROS: Dementia  Gastrointestinal: Positive for diarrhea.  Musculoskeletal: Positive for falls.    Past Medical History:  Diagnosis Date  . Arthritis    rheumatoid-  Dr Barkley Boards  . CHF (congestive heart failure) (HCC)    chronic systolic heart failure per Dr [name] [facility] note 09/23/11.   EKG 4/12, stress test  and cath from 8/12 on chart.  Clearance with LOV Dr [name] on chart  . Coronary artery disease   . Crohn disease ([facility])   . Dysrhythmia    nonsustained,asymptomatic VT per Dr [name] office note  resulting in cath  . GERD (gastroesophageal reflux disease)   . Heart attack ([facility]) 1996, 06/2011  . Hyperlipidemia   . Hypertension   . Pneumonia 1993  . Prostate troubles    states take alternating meds for bladder/prostate control- "age thing"  . Sleep apnea    sleep study years ago- has apnea but did not qualify for CPAP    Past Surgical History:  Procedure Laterality Date  . CARDIAC CATHETERIZATION     1996/ 2012 report on chart  . COLONOSCOPY    . HIP ARTHROPLASTY Left 03/04/2019   Procedure: LEFT HIP HEMIARTHROPLASTY;  Surgeon: Mcarthur Rossetti, MD;  Location: [facility];  Service: Orthopedics;  Laterality: Left;  . INGUINAL HERNIA REPAIR  11/04/2011  Procedure: HERNIA REPAIR INGUINAL ADULT;  Surgeon: Pedro Earls, MD;  Location: [facility];  Service: General;  Laterality: Right;  Right Inguinal Hernia Repair with Mesh  . KNEE ARTHROSCOPY Right 1976  . MENISCUS DEBRIDEMENT Left 1996  . TRIGGER FINGER RELEASE Right 10/10/2009  . UMBILICAL HERNIA REPAIR  2009  . WRIST SURGERY Left 04/25/91     reports that he quit smoking about 49 years ago. His smoking use included cigarettes. He has a 60.00 pack-year smoking history. He has never used smokeless  tobacco. He reports that he does not drink alcohol or use drugs.  No Known Allergies  Family History  Problem Relation Age of Onset  . Heart disease Mother   . Rheum arthritis Mother   . Emphysema Father     Prior to Admission medications   Medication Sig Start Date End Date Taking? Authorizing Provider  acetaminophen (TYLENOL) 325 MG tablet Take 650 mg by mouth every 6 (six) hours as needed for mild pain or fever.    [provider]  aspirin 81 MG chewable tablet Chew 1 tablet (81 mg total) by mouth 2 (two) times daily after a meal. 03/07/19   Regalado, Belkys A, MD  carvedilol (COREG) 6.25 MG tablet TAKE 1 TABLET TWICE A DAY WITH A MEAL. 02/22/18   Burtis Junes, NP  cefdinir (OMNICEF) 300 MG capsule Take 1 capsule (300 mg total) by mouth every 12 (twelve) hours. 03/07/19   Regalado, Belkys A, MD  Cholecalciferol (VITAMIN D3) 50 MCG (2000 UT) capsule Take 2,000 Units by mouth daily.     [provider]  Cyanocobalamin (B-12) 500 MCG TABS Take 500 mcg by mouth every evening.    [provider]  docusate sodium (COLACE) 100 MG capsule Take 1 capsule (100 mg total) by mouth 2 (two) times daily. 03/07/19   Regalado, Belkys A, MD  feeding supplement, ENSURE ENLIVE, (ENSURE ENLIVE) LIQD Take 237 mLs by mouth 2 (two) times daily between meals. 04/07/18   Debbe Odea, MD  furosemide (LASIX) 40 MG tablet Take 1 tablet (40 mg total) by mouth daily. 03/11/19   Amin, Ankit Chirag, MD  guaiFENesin-dextromethorphan (ROBITUSSIN DM) 100-10 MG/5ML syrup Take 5 mLs by mouth every 4 (four) hours as needed for cough. 10/13/18   Arrien, Jimmy Picket, MD  HYDROcodone-acetaminophen (NORCO/VICODIN) 5-325 MG tablet Take 1-2 tablets by mouth every 4 (four) hours as needed for moderate pain (pain score 4-6). 03/07/19   Regalado, Belkys A, MD  insulin aspart (NOVOLOG) 100 UNIT/ML injection Inject 0-12 Units into the skin 3 (three) times daily with meals. Sliding scale    [provider]  LORazepam (ATIVAN) 0.5 MG tablet Take 0.5 mg by mouth every evening.     [provider]  magnesium oxide (MAG-OX) 400 MG tablet Take 200 mg by mouth daily.     [provider]  mesalamine (LIALDA) 1.2 g EC tablet Take 1.2 g by mouth daily with breakfast.    [provider]  nitroGLYCERIN (NITROSTAT) 0.4 MG SL tablet Place 0.4 mg under the tongue every 5 (five) minutes as needed for chest pain.     [provider]  nystatin (MYCOSTATIN/NYSTOP) powder Apply topically 3 (three) times daily. To red and yeasty groin area    [provider]  omeprazole (PRILOSEC) 40 MG capsule Take 40 mg by mouth at bedtime.    [provider]  oxybutynin (DITROPAN) 5 MG tablet Take 5 mg by mouth 2 (two) times daily. 09/20/18  [provider]  pravastatin (PRAVACHOL) 40 MG tablet Take 1 tablet (40 mg total) by mouth at bedtime. 03/08/18   Belva Crome, MD  predniSONE (DELTASONE) 10 MG tablet Take 10 mg by mouth daily with breakfast.    [provider]  primidone (MYSOLINE) 50 MG tablet Take 50 mg by mouth at bedtime.    [provider]  tamsulosin (FLOMAX) 0.4 MG CAPS capsule Take 0.4 mg by mouth daily after supper.  10/01/14   [provider]  vitamin A 10000 UNIT capsule Take 10,000 Units by mouth daily.     [provider]  vitamin E 400 UNIT capsule Take 400 Units by mouth daily.     [provider]  zinc oxide 20 % ointment Apply 1 application topically 3 (three) times daily. To bilateral sheared areas on buttocks cheeks    [provider]    Physical Exam:  Constitutional: Elderly male who appears acutely ill Vitals:   03/23/20 1315 03/23/20 1330 03/23/20 1435 03/23/20 1515  BP:  105/71 105/63 108/61  Pulse: (!) 161 (!) 128 (!) 120 (!) 114  Resp:  (!) 28 20 (!) 23  Temp:      TempSrc:      SpO2:  93% 92% 93%  Weight:      Height:       Eyes: PERRL, lids and conjunctivae  normal ENMT: Mucous membranes are dry. Posterior pharynx clear of any exudate or lesions.hard of hearing.   Neck: normal, supple, no masses, no thyromegaly Respiratory: clear to auscultation bilaterally, no wheezing, no crackles. Normal respiratory effort. No accessory muscle use.  Cardiovascular: Tachycardic, no murmurs / rubs / gallops.  Trace extremity edema. 2+ pedal pulses. No carotid bruits.  Abdomen: no tenderness, no masses palpated. No hepatosplenomegaly. Bowel sounds positive.  Musculoskeletal: no clubbing / cyanosis. No joint deformity upper and lower extremities. Good ROM, no contractures. Normal muscle tone.  Skin: Skin tear to the left elbow now bandaged.  Bruising noted on the left side of the face. Neurologic: CN 2-12 grossly intact.  Patient able to move all extremities Psychiatric: Poor recent memory. alert and oriented x 1. Normal mood.     Labs on Admission: I have personally reviewed following labs and imaging studies  CBC: Recent Labs  Lab 03/23/20 1022  WBC 14.8*  NEUTROABS 13.4*  HGB 17.2*  HCT 52.3*  MCV 90.8  PLT 962   Basic Metabolic Panel: Recent Labs  Lab 03/23/20 1022  NA 138  K 3.2*  CL 97*  CO2 24  GLUCOSE 153*  BUN 18  CREATININE 1.20  CALCIUM 8.8*   GFR: Estimated Creatinine Clearance: 47.3 mL/min (by C-G formula based on SCr of 1.2 mg/dL). Liver Function Tests: Recent Labs  Lab 03/23/20 1022  AST 26  ALT 20  ALKPHOS 64  BILITOT 1.3*  PROT 7.0  ALBUMIN 3.6   No results for input(s): LIPASE, AMYLASE in the last 168 hours. No results for input(s): AMMONIA in the last 168 hours. Coagulation Profile: Recent Labs  Lab 03/23/20 1022  INR 1.1   Cardiac Enzymes: No results for input(s): CKTOTAL, CKMB, CKMBINDEX, TROPONINI in the last 168 hours. BNP (last 3 results) No results for input(s): PROBNP in the last 8760 hours. HbA1C: No results for input(s): HGBA1C in the last 72 hours. CBG: No results for input(s): GLUCAP in the  last 168 hours. Lipid Profile: No results for input(s): CHOL, HDL, LDLCALC, TRIG, CHOLHDL, LDLDIRECT in the last 72 hours. Thyroid  Function Tests: No results for input(s): TSH, T4TOTAL, FREET4, T3FREE, THYROIDAB in the last 72 hours. Anemia Panel: No results for input(s): VITAMINB12, FOLATE, FERRITIN, TIBC, IRON, RETICCTPCT in the last 72 hours. Urine analysis:    Component Value Date/Time   COLORURINE YELLOW 03/02/2019 1228   APPEARANCEUR CLEAR 03/02/2019 1228   LABSPEC 1.026 03/02/2019 1228   PHURINE 5.0 03/02/2019 1228   GLUCOSEU NEGATIVE 03/02/2019 1228   HGBUR SMALL (A) 03/02/2019 1228   BILIRUBINUR NEGATIVE 03/02/2019 1228   KETONESUR NEGATIVE 03/02/2019 1228   PROTEINUR NEGATIVE 03/02/2019 1228   UROBILINOGEN 0.2 11/05/2014 0303   NITRITE NEGATIVE 03/02/2019 1228   LEUKOCYTESUR NEGATIVE 03/02/2019 1228   Sepsis Labs: Recent Results (from the past 240 hour(s))  C Difficile Quick Screen w PCR reflex     Status: None   Collection Time: 03/23/20 11:28 AM   Specimen: STOOL  Result Value Ref Range Status   C Diff antigen NEGATIVE NEGATIVE Final   C Diff toxin NEGATIVE NEGATIVE Final   C Diff interpretation No C. difficile detected.  Final    Comment: Performed at [facility], [place] [address] [place], [place] [address]  Respiratory Panel by RT PCR (Flu A&B, Covid) - Nasopharyngeal Swab     Status: None   Collection Time: 03/23/20  1:28 PM   Specimen: Nasopharyngeal Swab  Result Value Ref Range Status   SARS Coronavirus 2 by RT PCR NEGATIVE NEGATIVE Final    Comment: (NOTE) SARS-CoV-2 target nucleic acids are NOT DETECTED. The SARS-CoV-2 RNA is generally detectable in upper respiratoy specimens during the acute phase of infection. The lowest concentration of SARS-CoV-2 viral copies this assay can detect is 131 copies/mL. A negative result does not preclude SARS-Cov-2 infection and should not be used as the sole basis for treatment or other patient management  decisions. A negative result may occur with  improper specimen collection/handling, submission of specimen other than nasopharyngeal swab, presence of viral mutation(s) within the areas targeted by this assay, and inadequate number of viral copies (<131 copies/mL). A negative result must be combined with clinical observations, patient history, and epidemiological information. The expected result is Negative. Fact Sheet for Patients:  PinkCheek.be Fact Sheet for Healthcare Providers:  GravelBags.it This test is not yet ap proved or cleared by the [place] FDA and  has been authorized for detection and/or diagnosis of SARS-CoV-2 by FDA under an Emergency Use Authorization (EUA). This EUA will remain  in effect (meaning this test can be used) for the duration of the COVID-19 declaration under Section 564(b)(1) of the Act, 21 U.S.C. section 360bbb-3(b)(1), unless the authorization is terminated or revoked sooner.    Influenza A by PCR NEGATIVE NEGATIVE Final   Influenza B by PCR NEGATIVE NEGATIVE Final    Comment: (NOTE) The Xpert Xpress SARS-CoV-2/FLU/RSV assay is intended as an aid in  the diagnosis of influenza from Nasopharyngeal swab specimens and  should not be used as a sole basis for treatment. Nasal washings and  aspirates are unacceptable for Xpert Xpress SARS-CoV-2/FLU/RSV  testing. Fact Sheet for Patients: PinkCheek.be Fact Sheet for Healthcare Providers: GravelBags.it This test is not yet approved or cleared by the [place] FDA and  has been authorized for detection and/or diagnosis of SARS-CoV-2 by  FDA under an Emergency Use Authorization (EUA). This EUA will remain  in effect (meaning this test can be used) for the duration of the  Covid-19 declaration under Section 564(b)(1) of the Act, 21  U.S.C. section 360bbb-3(b)(1), unless  the authorization  is  terminated or revoked. Performed at [facility], [place] [address] [place], [place] [address]      Radiological Exams on Admission: DG Elbow Complete Left  Result Date: 03/23/2020 CLINICAL DATA:  Unwitnessed fall in bathroom EXAM: LEFT ELBOW - COMPLETE 3+ VIEW COMPARISON:  None FINDINGS: Osseous demineralization. Olecranon spur present. Radiocapitellar joint space narrowing with spurring at radial head. Question small elbow joint effusion. No acute fracture, dislocation, or bone destruction. IMPRESSION: Radiocapitellar degenerative changes. Question small joint effusion. No definite acute fracture or dislocation identified. Electronically Signed   By: Lavonia Dana M.D.   On: 03/23/2020 13:16   CT Head Wo Contrast  Result Date: 03/23/2020 CLINICAL DATA:  Head trauma, unwitnessed fall EXAM: CT HEAD WITHOUT CONTRAST CT MAXILLOFACIAL WITHOUT CONTRAST CT CERVICAL SPINE WITHOUT CONTRAST TECHNIQUE: Multidetector CT imaging of the head, cervical spine, and maxillofacial structures were performed using the standard protocol without intravenous contrast. Multiplanar CT image reconstructions of the cervical spine and maxillofacial structures were also generated. COMPARISON:  11/05/2014, thyroid ultrasound, 06/08/2011 FINDINGS: CT HEAD FINDINGS Brain: No evidence of acute infarction, hemorrhage, hydrocephalus, extra-axial collection or mass lesion/mass effect. Extensive periventricular and deep white matter hypodensity. Prominence of the lateral ventricles, likely due to global volume loss. Vascular: No hyperdense vessel or unexpected calcification. CT FACIAL BONES FINDINGS Skull: Normal. Negative for fracture or focal lesion. Facial bones: No displaced fractures or dislocations. Sinuses/Orbits: No acute finding. Other: None. CT CERVICAL SPINE FINDINGS Alignment: Normal. Skull base and vertebrae: No acute fracture. No primary bone lesion or focal pathologic process. Soft tissues and spinal canal: No  prevertebral fluid or swelling. No visible canal hematoma. Disc levels: Moderate to severe multilevel disc degenerative and osteophytosis of the cervical spine, with multilevel partial ankylosis. Upper chest: Negative. Other: Enlarged, heterogeneous thyroid, particularly the left lobe, in keeping with appearance on ultrasound dated 06/08/2011 at which time no discrete nodule was identified. IMPRESSION: 1.  No acute intracranial pathology. 2. Extensive periventricular and deep white matter hypodensity with prominence of the lateral ventricles, likely due to advanced small-vessel white matter disease and global volume loss. Prominence of the lateral ventricles is however significantly increased compared to prior examination dated 11/05/2014 and normal pressure hydrocephalus is not strictly excluded. Correlate with referable clinical symptoms, if present. 3.  No displaced fracture or dislocation of the facial bones. 4.  No fracture or static subluxation of the cervical spine. Electronically Signed   By: Eddie Candle M.D.   On: 03/23/2020 13:32   CT Cervical Spine Wo Contrast  Result Date: 03/23/2020 CLINICAL DATA:  Head trauma, unwitnessed fall EXAM: CT HEAD WITHOUT CONTRAST CT MAXILLOFACIAL WITHOUT CONTRAST CT CERVICAL SPINE WITHOUT CONTRAST TECHNIQUE: Multidetector CT imaging of the head, cervical spine, and maxillofacial structures were performed using the standard protocol without intravenous contrast. Multiplanar CT image reconstructions of the cervical spine and maxillofacial structures were also generated. COMPARISON:  11/05/2014, thyroid ultrasound, 06/08/2011 FINDINGS: CT HEAD FINDINGS Brain: No evidence of acute infarction, hemorrhage, hydrocephalus, extra-axial collection or mass lesion/mass effect. Extensive periventricular and deep white matter hypodensity. Prominence of the lateral ventricles, likely due to global volume loss. Vascular: No hyperdense vessel or unexpected calcification. CT FACIAL BONES  FINDINGS Skull: Normal. Negative for fracture or focal lesion. Facial bones: No displaced fractures or dislocations. Sinuses/Orbits: No acute finding. Other: None. CT CERVICAL SPINE FINDINGS Alignment: Normal. Skull base and vertebrae: No acute fracture. No primary bone lesion or focal pathologic process. Soft tissues and spinal canal: No  prevertebral fluid or swelling. No visible canal hematoma. Disc levels: Moderate to severe multilevel disc degenerative and osteophytosis of the cervical spine, with multilevel partial ankylosis. Upper chest: Negative. Other: Enlarged, heterogeneous thyroid, particularly the left lobe, in keeping with appearance on ultrasound dated 06/08/2011 at which time no discrete nodule was identified. IMPRESSION: 1.  No acute intracranial pathology. 2. Extensive periventricular and deep white matter hypodensity with prominence of the lateral ventricles, likely due to advanced small-vessel white matter disease and global volume loss. Prominence of the lateral ventricles is however significantly increased compared to prior examination dated 11/05/2014 and normal pressure hydrocephalus is not strictly excluded. Correlate with referable clinical symptoms, if present. 3.  No displaced fracture or dislocation of the facial bones. 4.  No fracture or static subluxation of the cervical spine. Electronically Signed   By: Eddie Candle M.D.   On: 03/23/2020 13:32   CT ABDOMEN PELVIS W CONTRAST  Result Date: 03/23/2020 CLINICAL DATA:  Abdominal pain, unwitnessed fall EXAM: CT ABDOMEN AND PELVIS WITH CONTRAST TECHNIQUE: Multidetector CT imaging of the abdomen and pelvis was performed using the standard protocol following bolus administration of intravenous contrast. CONTRAST:  129m OMNIPAQUE IOHEXOL 300 MG/ML  SOLN COMPARISON:  04/04/2018 FINDINGS: Lower chest: No acute abnormality. Dependent bibasilar scarring and or atelectasis. Three-vessel coronary artery calcifications. Hepatobiliary: No solid  liver abnormality is seen. No gallstones, gallbladder wall thickening, or biliary dilatation. Pancreas: Unremarkable. No pancreatic ductal dilatation or surrounding inflammatory changes. Spleen: Normal in size without significant abnormality. Adrenals/Urinary Tract: Adrenal glands are unremarkable. Kidneys are normal, without renal calculi, solid lesion, or hydronephrosis. Bladder is unremarkable. Stomach/Bowel: Stomach is within normal limits. Appendix appears normal. No evidence of bowel wall thickening, distention, or inflammatory changes. Colon is fluid-filled to the rectum. Vascular/Lymphatic: Aortic atherosclerosis. No enlarged abdominal or pelvic lymph nodes. Reproductive: No mass or other significant abnormality. Other: Fat containing bilateral inguinal hernias. No abdominopelvic ascites. Musculoskeletal: There is a new, although age-indeterminate wedge deformity of the T12 vertebral body with approximately 25% anterior height loss. IMPRESSION: 1. No evidence of acute traumatic injury to the organs of the abdomen or pelvis. 2. There is a new, although age-indeterminate wedge deformity of the T12 vertebral body with approximately 25% anterior height loss. 3. The colon is fluid-filled to the rectum, in keeping with diarrheal illness. 4.  Coronary artery disease.  Aortic Atherosclerosis (ICD10-I70.0). Electronically Signed   By: AEddie CandleM.D.   On: 03/23/2020 13:37   DG Chest Port 1 View  Result Date: 03/23/2020 CLINICAL DATA:  Sepsis, unwitnessed fall in bathroom, dementia, single episode of vomiting after fall EXAM: PORTABLE CHEST 1 VIEW COMPARISON:  Portable exam 1151 hours compared to 03/01/2019 FINDINGS: Normal heart size, mediastinal contours, and pulmonary vascularity. Low lung volumes with bibasilar atelectasis. Lungs otherwise clear. No infiltrate, pleural effusion, or pneumothorax. Bones demineralized. IMPRESSION: Low lung volumes with bibasilar atelectasis. Electronically Signed   By: MLavonia DanaM.D.   On: 03/23/2020 13:09   CT Maxillofacial Wo Contrast  Result Date: 03/23/2020 CLINICAL DATA:  Head trauma, unwitnessed fall EXAM: CT HEAD WITHOUT CONTRAST CT MAXILLOFACIAL WITHOUT CONTRAST CT CERVICAL SPINE WITHOUT CONTRAST TECHNIQUE: Multidetector CT imaging of the head, cervical spine, and maxillofacial structures were performed using the standard protocol without intravenous contrast. Multiplanar CT image reconstructions of the cervical spine and maxillofacial structures were also generated. COMPARISON:  11/05/2014, thyroid ultrasound, 06/08/2011 FINDINGS: CT HEAD FINDINGS Brain: No evidence of acute infarction, hemorrhage, hydrocephalus, extra-axial collection or mass lesion/mass effect. Extensive  periventricular and deep white matter hypodensity. Prominence of the lateral ventricles, likely due to global volume loss. Vascular: No hyperdense vessel or unexpected calcification. CT FACIAL BONES FINDINGS Skull: Normal. Negative for fracture or focal lesion. Facial bones: No displaced fractures or dislocations. Sinuses/Orbits: No acute finding. Other: None. CT CERVICAL SPINE FINDINGS Alignment: Normal. Skull base and vertebrae: No acute fracture. No primary bone lesion or focal pathologic process. Soft tissues and spinal canal: No prevertebral fluid or swelling. No visible canal hematoma. Disc levels: Moderate to severe multilevel disc degenerative and osteophytosis of the cervical spine, with multilevel partial ankylosis. Upper chest: Negative. Other: Enlarged, heterogeneous thyroid, particularly the left lobe, in keeping with appearance on ultrasound dated 06/08/2011 at which time no discrete nodule was identified. IMPRESSION: 1.  No acute intracranial pathology. 2. Extensive periventricular and deep white matter hypodensity with prominence of the lateral ventricles, likely due to advanced small-vessel white matter disease and global volume loss. Prominence of the lateral ventricles is however  significantly increased compared to prior examination dated 11/05/2014 and normal pressure hydrocephalus is not strictly excluded. Correlate with referable clinical symptoms, if present. 3.  No displaced fracture or dislocation of the facial bones. 4.  No fracture or static subluxation of the cervical spine. Electronically Signed   By: Eddie Candle M.D.   On: 03/23/2020 13:32    EKG: Independently reviewed.  Sinus tachycardia 134 bpm.  Assessment/Plan Sepsis secondary to suspected GI pathogen: Patient presented after having a fall.  He was found to be febrile up to 103 F with tachycardia and tachypnea.  Labs significant for WBC elevated at 14.8 and lactic acid up to 3.4.daughter reported GI bug going around the facility where he lives.  C. difficile screening was negative.  Patient had been initially started on empiric antibiotics of vancomycin, metronidazole, and cefepime.  CT imaging of the abdomen and pelvis noted concern for possible diarrheal illness. -Admit to a medical telemetry bed -Follow-up blood culture -Follow-up GI panel -Continue empiric antibiotics of vancomycin and cefepime and de-escalate when medically appropriate -Tylenol as needed for fever. -Trend lactic acid levels -Recheck CBC in a.m.  Diarrhea: Daughter notes that there have been a GI bug going around the facility.  -Enteric precautions -Monitor intake and output -Hold stool softeners -Follow-up GI panel  Acute kidney injury: Patient baseline creatinine previously noted to be around 0.8, patient presents with creatinine elevated up to 1.2 with BUN 18.  Patient has been on IV Lasix in addition to p.o. Lasix.  Suspect possibly related with over diuresis. -Hold nephrotoxic agent -Continue IV fluids at 75 mL/h  Hypokalemia: Acute.  Initial potassium noted to be 3.2 on admission. -Give 40 mEq of potassium chloride x1 dose now. -Continue to monitor and replace as needed  Essential hypertension: Blood pressure currently  as low as 95/52.  Home blood pressure medications include Coreg 6.25 mg twice daily and furosemide 40 mg daily. -Hold current blood pressure medications  Systolic congestive heart failure: Patient does not appear to be acutely fluid overloaded at this time. -Strict intake and output and daily weights  Crohn's disease: Imaging studies did not show signs of an active flare of patient's Crohn's disease -Continue mesalamine  Rheumatoid arthritis, chronic steroid use: Patient on chronic steroids prednisone. -Continue prednisone   History of diabetes mellitus type 2: Initial glucose 153.  Patient does not appear to be on any diabetic medications in the outpatient setting. -Continue to monitor and placed on sliding scale of insulin if needed  Dementia,  without behavioral disturbance -Continue Aricept  Hyperlipidemia -Continue statin  Recent fall, debility: At baseline patient helps some with transfers.  No acute fractures noted on imaging studies.  Daughter reports that he is also complaining of some right shoulder pain. -Check x-ray of right shoulder  GERD -Continue pharmacy substitution for omeprazole  DO NOT RESUSCITATE  DVT prophylaxis: Lovenox Code Status: DNR Family Communication: Discussed plan of care with the patient daughter present at bedside Disposition Plan: Likely discharge back to nursing facility once medically Consults called: None  Admission status: Inpatient  Norval Morton MD [facility] Pager [phone]   If 7PM-7AM, please contact night-coverage [email or web address] Password [facility]  03/23/2020, 3:34 PM

## 2020-03-23 NOTE — ED Provider Notes (Signed)
[facility] EMERGENCY DEPARTMENT Provider Note   CSN: [id number] Arrival date & time: 03/23/20  [id number]     History Chief Complaint  Patient presents with  . Fall  . Emesis  . Diarrhea    Anthony [name] is a 86 y.o. male.  The history is provided by a relative, medical records and the patient. No language interpreter was used.  Fall  Emesis Associated symptoms: diarrhea   Diarrhea Associated symptoms: vomiting      86 year old male with history of dementia, CHF, CAD, hypertension, BPH, brought here via EMS from nursing facility for evaluation of an unwitnessed fall.  History obtained through daughter who is at bedside.  Per daughter, she saw patient yesterday and he was doing okay.  She does have some mild abdominal distention that she felt could be related to constipation and did request for him to have laxatives.  This morning it was thought the patient was trying to get to the bathroom, however he fell striking his head and left elbow on hard surface.  Unknown if he has any loss of consciousness.  Patient does not complain of any significant pain or discomfort however he has history of dementia and is a poor historian.  EMS did noted that patient has bruising to his left eyebrow as well as skin tear to the left elbow.  A c-collar was placed.  No report of any runny nose sneezing or coughing or dysuria.  Patient does not complain of any pain in his chest or abdomen or numbness or weakness.  No recent antibiotic use.  When EMS arrived, patient was on the ground with stool everywhere.  Patient has not had Covid vaccination due to his immunocompromise states with history of Crohn's and RA.    Past Medical History:  Diagnosis Date  . Arthritis    rheumatoid-  Dr Barkley Boards  . CHF (congestive heart failure) (HCC)    chronic systolic heart failure per Dr [name] LOV note 09/23/11.   EKG 4/12, stress test  and cath from 8/12 on chart.  Clearance with LOV Dr [name] on chart    . Coronary artery disease   . Crohn disease ([place])   . Dysrhythmia    nonsustained,asymptomatic VT per Dr [name] office note  resulting in cath  . GERD (gastroesophageal reflux disease)   . Heart attack ([place]) 1996, 06/2011  . Hyperlipidemia   . Hypertension   . Pneumonia 1993  . Prostate troubles    states take alternating meds for bladder/prostate control- "age thing"  . Sleep apnea    sleep study years ago- has apnea but did not qualify for CPAP    Patient Active Problem List   Diagnosis Date Noted  . S/P hip hemiarthroplasty 04/19/2019  . Pressure injury of skin 03/05/2019  . Suspected COVID-19 virus infection 03/01/2019  . Acute respiratory failure with hypoxia ([place]) 03/01/2019  . Hip fracture ([place]) 03/01/2019  . Fall 03/01/2019  . Displaced fracture of base of neck of left femur, initial encounter for closed fracture ([place])   . Fever   . Nonspecific interstitial pneumonia ([place])   . Febrile illness   . Thrush   . Hypoxia   . Sepsis ([place]) 10/04/2018  . Fatty liver 04/06/2018  . Acute pancreatitis 04/04/2018  . Abdominal pain, right lower quadrant 05/28/2015  . Absolute anemia 05/28/2015  . Aortic atherosclerosis ([place]) 05/28/2015  . Altered blood in stool 05/28/2015  . Chronic systolic heart failure ([place]) 05/28/2015  . Syncope and  collapse 05/28/2015  . Arteriosclerosis of coronary artery 05/28/2015  . Cough 05/28/2015  . Crohn's disease of large bowel ([place]) 05/28/2015  . D (diarrhea) 05/28/2015  . Displacement of cervical intervertebral disc without myelopathy 05/28/2015  . Benign essential HTN 05/28/2015  . Adenopathy 05/28/2015  . Hypercholesterolemia without hypertriglyceridemia 05/28/2015  . Hypo-osmolality and hyponatremia 05/28/2015  . Postinflammatory pulmonary fibrosis ([place]) 05/28/2015  . Healed myocardial infarct 05/28/2015  . Peptic esophagitis 05/28/2015  . 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 Paroxysmal ventricular tachycardia ([place]) 05/28/2015  . Decreased body  weight 05/28/2015  . Weakness 11/05/2014  . Nausea with vomiting 11/05/2014  . Generalized weakness   . Dehydration   . Difficulty walking   . Pulmonary infiltrates 07/16/2014  . Chronic combined systolic and diastolic heart failure ([place]) 05/31/2014  . Essential hypertension 05/31/2014  .  H/O Right inguinal hernia repair 01/15/2012  . Small fat containing right inguinal hernia that has been painful 08/05/2011  . Finger wound, simple, open 10/12/2008  . Benign prostatic hyperplasia without urinary obstruction 04/30/2008  . Synovitis and tenosynovitis 04/30/2008  . HLD (hyperlipidemia) 04/12/2008  . COLONIC POLYPS 11/21/2007  . MI 11/21/2007  . Coronary atherosclerosis 11/21/2007  . GERD 11/21/2007  . Rheumatoid arthritis ([place]) 11/21/2007  . SLEEP APNEA, MILD 11/21/2007    Past Surgical History:  Procedure Laterality Date  . CARDIAC CATHETERIZATION     1996/ 2012 report on chart  . COLONOSCOPY    . HIP ARTHROPLASTY Left 03/04/2019   Procedure: LEFT HIP HEMIARTHROPLASTY;  Surgeon: Mcarthur Rossetti, MD;  Location: [facility];  Service: Orthopedics;  Laterality: Left;  . INGUINAL HERNIA REPAIR  11/04/2011   Procedure: HERNIA REPAIR INGUINAL ADULT;  Surgeon: Pedro Earls, MD;  Location: [facility];  Service: General;  Laterality: Right;  Right Inguinal Hernia Repair with Mesh  . KNEE ARTHROSCOPY Right 1976  . MENISCUS DEBRIDEMENT Left 1996  . TRIGGER FINGER RELEASE Right 10/10/2009  . UMBILICAL HERNIA REPAIR  2009  . WRIST SURGERY Left 04/25/91       Family History  Problem Relation Age of Onset  . Heart disease Mother   . Rheum arthritis Mother   . Emphysema Father     Social History   Tobacco Use  . Smoking status: Former Smoker    Packs/day: 3.00    Years: 20.00    Pack years: 60.00    Types: Cigarettes    Quit date: 11/01/1970    Years since quitting: 49.4  . Smokeless tobacco: Never Used  . Tobacco comment: quit 1967  Substance Use Topics  . Alcohol use: No   . Drug use: No    Home Medications Prior to Admission medications   Medication Sig Start Date End Date Taking? Authorizing Provider  acetaminophen (TYLENOL) 325 MG tablet Take 650 mg by mouth every 6 (six) hours as needed for mild pain or fever.    [provider]  aspirin 81 MG chewable tablet Chew 1 tablet (81 mg total) by mouth 2 (two) times daily after a meal. 03/07/19   Regalado, Belkys A, MD  carvedilol (COREG) 6.25 MG tablet TAKE 1 TABLET TWICE A DAY WITH A MEAL. 02/22/18   Burtis Junes, NP  cefdinir (OMNICEF) 300 MG capsule Take 1 capsule (300 mg total) by mouth every 12 (twelve) hours. 03/07/19   Regalado, Belkys A, MD  Cholecalciferol (VITAMIN D3) 50 MCG (2000 UT) capsule Take 2,000 Units by mouth daily.     [provider]  Cyanocobalamin (B-12) 500  MCG TABS Take 500 mcg by mouth every evening.    [provider]  docusate sodium (COLACE) 100 MG capsule Take 1 capsule (100 mg total) by mouth 2 (two) times daily. 03/07/19   Regalado, Belkys A, MD  feeding supplement, ENSURE ENLIVE, (ENSURE ENLIVE) LIQD Take 237 mLs by mouth 2 (two) times daily between meals. 04/07/18   Debbe Odea, MD  furosemide (LASIX) 40 MG tablet Take 1 tablet (40 mg total) by mouth daily. 03/11/19   Amin, Ankit Chirag, MD  guaiFENesin-dextromethorphan (ROBITUSSIN DM) 100-10 MG/5ML syrup Take 5 mLs by mouth every 4 (four) hours as needed for cough. 10/13/18   Arrien, Jimmy Picket, MD  HYDROcodone-acetaminophen (NORCO/VICODIN) 5-325 MG tablet Take 1-2 tablets by mouth every 4 (four) hours as needed for moderate pain (pain score 4-6). 03/07/19   Regalado, Belkys A, MD  insulin aspart (NOVOLOG) 100 UNIT/ML injection Inject 0-12 Units into the skin 3 (three) times daily with meals. Sliding scale    [provider]  LORazepam (ATIVAN) 0.5 MG tablet Take 0.5 mg by mouth every evening.     [provider]  magnesium oxide (MAG-OX) 400 MG tablet Take 200 mg by mouth daily.      [provider]  mesalamine (LIALDA) 1.2 g EC tablet Take 1.2 g by mouth daily with breakfast.    [provider]  nitroGLYCERIN (NITROSTAT) 0.4 MG SL tablet Place 0.4 mg under the tongue every 5 (five) minutes as needed for chest pain.     [provider]  nystatin (MYCOSTATIN/NYSTOP) powder Apply topically 3 (three) times daily. To red and yeasty groin area    [provider]  omeprazole (PRILOSEC) 40 MG capsule Take 40 mg by mouth at bedtime.    [provider]  oxybutynin (DITROPAN) 5 MG tablet Take 5 mg by mouth 2 (two) times daily. 09/20/18   [provider]  pravastatin (PRAVACHOL) 40 MG tablet Take 1 tablet (40 mg total) by mouth at bedtime. 03/08/18   Belva Crome, MD  predniSONE (DELTASONE) 10 MG tablet Take 10 mg by mouth daily with breakfast.    [provider]  primidone (MYSOLINE) 50 MG tablet Take 50 mg by mouth at bedtime.    [provider]  tamsulosin (FLOMAX) 0.4 MG CAPS capsule Take 0.4 mg by mouth daily after supper.  10/01/14   [provider]  vitamin A 10000 UNIT capsule Take 10,000 Units by mouth daily.     [provider]  vitamin E 400 UNIT capsule Take 400 Units by mouth daily.     [provider]  zinc oxide 20 % ointment Apply 1 application topically 3 (three) times daily. To bilateral sheared areas on buttocks cheeks    [provider]    Allergies    Patient has no known allergies.  Review of Systems   Review of Systems  Unable to perform ROS: Dementia  Gastrointestinal: Positive for diarrhea and vomiting.    Physical Exam Updated Vital Signs BP 140/70 (BP Location: Right Arm)   Pulse (!) 136   Temp (!) 103.1 F (39.5 C) (Rectal)   Resp (!) 29   Ht 5' 10"  (1.778 m)   Wt 79.7 kg   SpO2 90%   BMI 25.21 kg/m   Physical Exam Vitals and nursing note reviewed.  Constitutional:      General: He is not in acute distress.    Appearance: He is  well-developed.     Comments: Elderly male  alert but ill-appearing.  HENT:     Head: Normocephalic.     Comments: Faint ecchymosis and edema noted to left orbital region with mild tenderness to palpation. Eyes:     Extraocular Movements: Extraocular movements intact.     Conjunctiva/sclera: Conjunctivae normal.     Pupils: Pupils are equal, round, and reactive to light.  Neck:     Comments: C-collar is in place, no cervical midline spine tenderness crepitus or step-off. Cardiovascular:     Rate and Rhythm: Tachycardia present.     Heart sounds: No murmur.  Pulmonary:     Comments: Tachypneic with decreased breath sounds but no obvious wheezes, rales, or rhonchi. Abdominal:     Palpations: Abdomen is soft.     Tenderness: There is no abdominal tenderness.  Genitourinary:    Comments: Stool incontinence. Musculoskeletal:        General: No tenderness.     Cervical back: Normal range of motion and neck supple.     Comments: Left elbow: Several skin tear noted to dorsal of the elbow with normal elbow flexion and extension and no deformity.  Able to move all 4 extremities equally with poor effort. No significant midline spine tenderness, crepitus or step off  Skin:    Capillary Refill: Capillary refill takes less than 2 seconds.     Findings: No rash.  Neurological:     Mental Status: He is alert. He is disoriented.  Psychiatric:        Mood and Affect: Mood normal.     ED Results / Procedures / Treatments   Labs (all labs ordered are listed, but only abnormal results are displayed) Labs Reviewed  LACTIC ACID, PLASMA - Abnormal; Notable for the following components:      Result Value   Lactic Acid, Venous 3.3 (*)    All other components within normal limits  COMPREHENSIVE METABOLIC PANEL - Abnormal; Notable for the following components:   Potassium 3.2 (*)    Chloride 97 (*)    Glucose, Bld 153 (*)    Calcium 8.8 (*)    Total Bilirubin 1.3 (*)    GFR calc non Af Amer 55  (*)    Anion gap 17 (*)    All other components within normal limits  CBC WITH DIFFERENTIAL/PLATELET - Abnormal; Notable for the following components:   WBC 14.8 (*)    Hemoglobin 17.2 (*)    HCT 52.3 (*)    Neutro Abs 13.4 (*)    Lymphs Abs 0.5 (*)    Abs Immature Granulocytes 0.10 (*)    All other components within normal limits  C DIFFICILE QUICK SCREEN W PCR REFLEX  RESPIRATORY PANEL BY RT PCR (FLU A&B, COVID)  CULTURE, BLOOD (ROUTINE X 2)  CULTURE, BLOOD (ROUTINE X 2)  URINE CULTURE  GASTROINTESTINAL PANEL BY PCR, STOOL (REPLACES STOOL CULTURE)  APTT  PROTIME-INR  LACTIC ACID, PLASMA  URINALYSIS, ROUTINE W REFLEX MICROSCOPIC  POC SARS CORONAVIRUS 2 AG -  ED    EKG EKG Interpretation  Date/Time:  Saturday Mar 23 2020 11:26:04 EDT Ventricular Rate:  154 PR Interval:    QRS Duration: 105 QT Interval:  299 QTC Calculation: 479 R Axis:   -98 Text Interpretation: Atrial fibrillation with rapid V-rate Ventricular premature complex Left anterior fascicular block Abnormal R-wave progression, late transition When compared to priorl now afib with RVR> No STEMI Confirmed by Antony Blackbird [phone]) on 03/23/2020 11:38:55 AM   Radiology DG Elbow Complete Left  Result Date:  03/23/2020 CLINICAL DATA:  Unwitnessed fall in bathroom EXAM: LEFT ELBOW - COMPLETE 3+ VIEW COMPARISON:  None FINDINGS: Osseous demineralization. Olecranon spur present. Radiocapitellar joint space narrowing with spurring at radial head. Question small elbow joint effusion. No acute fracture, dislocation, or bone destruction. IMPRESSION: Radiocapitellar degenerative changes. Question small joint effusion. No definite acute fracture or dislocation identified. Electronically Signed   By: Lavonia Dana M.D.   On: 03/23/2020 13:16   CT Head Wo Contrast  Result Date: 03/23/2020 CLINICAL DATA:  Head trauma, unwitnessed fall EXAM: CT HEAD WITHOUT CONTRAST CT MAXILLOFACIAL WITHOUT CONTRAST CT CERVICAL SPINE WITHOUT CONTRAST  TECHNIQUE: Multidetector CT imaging of the head, cervical spine, and maxillofacial structures were performed using the standard protocol without intravenous contrast. Multiplanar CT image reconstructions of the cervical spine and maxillofacial structures were also generated. COMPARISON:  11/05/2014, thyroid ultrasound, 06/08/2011 FINDINGS: CT HEAD FINDINGS Brain: No evidence of acute infarction, hemorrhage, hydrocephalus, extra-axial collection or mass lesion/mass effect. Extensive periventricular and deep white matter hypodensity. Prominence of the lateral ventricles, likely due to global volume loss. Vascular: No hyperdense vessel or unexpected calcification. CT FACIAL BONES FINDINGS Skull: Normal. Negative for fracture or focal lesion. Facial bones: No displaced fractures or dislocations. Sinuses/Orbits: No acute finding. Other: None. CT CERVICAL SPINE FINDINGS Alignment: Normal. Skull base and vertebrae: No acute fracture. No primary bone lesion or focal pathologic process. Soft tissues and spinal canal: No prevertebral fluid or swelling. No visible canal hematoma. Disc levels: Moderate to severe multilevel disc degenerative and osteophytosis of the cervical spine, with multilevel partial ankylosis. Upper chest: Negative. Other: Enlarged, heterogeneous thyroid, particularly the left lobe, in keeping with appearance on ultrasound dated 06/08/2011 at which time no discrete nodule was identified. IMPRESSION: 1.  No acute intracranial pathology. 2. Extensive periventricular and deep white matter hypodensity with prominence of the lateral ventricles, likely due to advanced small-vessel white matter disease and global volume loss. Prominence of the lateral ventricles is however significantly increased compared to prior examination dated 11/05/2014 and normal pressure hydrocephalus is not strictly excluded. Correlate with referable clinical symptoms, if present. 3.  No displaced fracture or dislocation of the facial  bones. 4.  No fracture or static subluxation of the cervical spine. Electronically Signed   By: Eddie Candle M.D.   On: 03/23/2020 13:32   CT Cervical Spine Wo Contrast  Result Date: 03/23/2020 CLINICAL DATA:  Head trauma, unwitnessed fall EXAM: CT HEAD WITHOUT CONTRAST CT MAXILLOFACIAL WITHOUT CONTRAST CT CERVICAL SPINE WITHOUT CONTRAST TECHNIQUE: Multidetector CT imaging of the head, cervical spine, and maxillofacial structures were performed using the standard protocol without intravenous contrast. Multiplanar CT image reconstructions of the cervical spine and maxillofacial structures were also generated. COMPARISON:  11/05/2014, thyroid ultrasound, 06/08/2011 FINDINGS: CT HEAD FINDINGS Brain: No evidence of acute infarction, hemorrhage, hydrocephalus, extra-axial collection or mass lesion/mass effect. Extensive periventricular and deep white matter hypodensity. Prominence of the lateral ventricles, likely due to global volume loss. Vascular: No hyperdense vessel or unexpected calcification. CT FACIAL BONES FINDINGS Skull: Normal. Negative for fracture or focal lesion. Facial bones: No displaced fractures or dislocations. Sinuses/Orbits: No acute finding. Other: None. CT CERVICAL SPINE FINDINGS Alignment: Normal. Skull base and vertebrae: No acute fracture. No primary bone lesion or focal pathologic process. Soft tissues and spinal canal: No prevertebral fluid or swelling. No visible canal hematoma. Disc levels: Moderate to severe multilevel disc degenerative and osteophytosis of the cervical spine, with multilevel partial ankylosis. Upper chest: Negative. Other: Enlarged, heterogeneous thyroid, particularly the left lobe,  in keeping with appearance on ultrasound dated 06/08/2011 at which time no discrete nodule was identified. IMPRESSION: 1.  No acute intracranial pathology. 2. Extensive periventricular and deep white matter hypodensity with prominence of the lateral ventricles, likely due to advanced  small-vessel white matter disease and global volume loss. Prominence of the lateral ventricles is however significantly increased compared to prior examination dated 11/05/2014 and normal pressure hydrocephalus is not strictly excluded. Correlate with referable clinical symptoms, if present. 3.  No displaced fracture or dislocation of the facial bones. 4.  No fracture or static subluxation of the cervical spine. Electronically Signed   By: Eddie Candle M.D.   On: 03/23/2020 13:32   CT ABDOMEN PELVIS W CONTRAST  Result Date: 03/23/2020 CLINICAL DATA:  Abdominal pain, unwitnessed fall EXAM: CT ABDOMEN AND PELVIS WITH CONTRAST TECHNIQUE: Multidetector CT imaging of the abdomen and pelvis was performed using the standard protocol following bolus administration of intravenous contrast. CONTRAST:  139m OMNIPAQUE IOHEXOL 300 MG/ML  SOLN COMPARISON:  04/04/2018 FINDINGS: Lower chest: No acute abnormality. Dependent bibasilar scarring and or atelectasis. Three-vessel coronary artery calcifications. Hepatobiliary: No solid liver abnormality is seen. No gallstones, gallbladder wall thickening, or biliary dilatation. Pancreas: Unremarkable. No pancreatic ductal dilatation or surrounding inflammatory changes. Spleen: Normal in size without significant abnormality. Adrenals/Urinary Tract: Adrenal glands are unremarkable. Kidneys are normal, without renal calculi, solid lesion, or hydronephrosis. Bladder is unremarkable. Stomach/Bowel: Stomach is within normal limits. Appendix appears normal. No evidence of bowel wall thickening, distention, or inflammatory changes. Colon is fluid-filled to the rectum. Vascular/Lymphatic: Aortic atherosclerosis. No enlarged abdominal or pelvic lymph nodes. Reproductive: No mass or other significant abnormality. Other: Fat containing bilateral inguinal hernias. No abdominopelvic ascites. Musculoskeletal: There is a new, although age-indeterminate wedge deformity of the T12 vertebral body with  approximately 25% anterior height loss. IMPRESSION: 1. No evidence of acute traumatic injury to the organs of the abdomen or pelvis. 2. There is a new, although age-indeterminate wedge deformity of the T12 vertebral body with approximately 25% anterior height loss. 3. The colon is fluid-filled to the rectum, in keeping with diarrheal illness. 4.  Coronary artery disease.  Aortic Atherosclerosis (ICD10-I70.0). Electronically Signed   By: AEddie CandleM.D.   On: 03/23/2020 13:37   DG Chest Port 1 View  Result Date: 03/23/2020 CLINICAL DATA:  Sepsis, unwitnessed fall in bathroom, dementia, single episode of vomiting after fall EXAM: PORTABLE CHEST 1 VIEW COMPARISON:  Portable exam 1151 hours compared to 03/01/2019 FINDINGS: Normal heart size, mediastinal contours, and pulmonary vascularity. Low lung volumes with bibasilar atelectasis. Lungs otherwise clear. No infiltrate, pleural effusion, or pneumothorax. Bones demineralized. IMPRESSION: Low lung volumes with bibasilar atelectasis. Electronically Signed   By: MLavonia DanaM.D.   On: 03/23/2020 13:09   CT Maxillofacial Wo Contrast  Result Date: 03/23/2020 CLINICAL DATA:  Head trauma, unwitnessed fall EXAM: CT HEAD WITHOUT CONTRAST CT MAXILLOFACIAL WITHOUT CONTRAST CT CERVICAL SPINE WITHOUT CONTRAST TECHNIQUE: Multidetector CT imaging of the head, cervical spine, and maxillofacial structures were performed using the standard protocol without intravenous contrast. Multiplanar CT image reconstructions of the cervical spine and maxillofacial structures were also generated. COMPARISON:  11/05/2014, thyroid ultrasound, 06/08/2011 FINDINGS: CT HEAD FINDINGS Brain: No evidence of acute infarction, hemorrhage, hydrocephalus, extra-axial collection or mass lesion/mass effect. Extensive periventricular and deep white matter hypodensity. Prominence of the lateral ventricles, likely due to global volume loss. Vascular: No hyperdense vessel or unexpected calcification. CT  FACIAL BONES FINDINGS Skull: Normal. Negative for fracture or focal lesion.  Facial bones: No displaced fractures or dislocations. Sinuses/Orbits: No acute finding. Other: None. CT CERVICAL SPINE FINDINGS Alignment: Normal. Skull base and vertebrae: No acute fracture. No primary bone lesion or focal pathologic process. Soft tissues and spinal canal: No prevertebral fluid or swelling. No visible canal hematoma. Disc levels: Moderate to severe multilevel disc degenerative and osteophytosis of the cervical spine, with multilevel partial ankylosis. Upper chest: Negative. Other: Enlarged, heterogeneous thyroid, particularly the left lobe, in keeping with appearance on ultrasound dated 06/08/2011 at which time no discrete nodule was identified. IMPRESSION: 1.  No acute intracranial pathology. 2. Extensive periventricular and deep white matter hypodensity with prominence of the lateral ventricles, likely due to advanced small-vessel white matter disease and global volume loss. Prominence of the lateral ventricles is however significantly increased compared to prior examination dated 11/05/2014 and normal pressure hydrocephalus is not strictly excluded. Correlate with referable clinical symptoms, if present. 3.  No displaced fracture or dislocation of the facial bones. 4.  No fracture or static subluxation of the cervical spine. Electronically Signed   By: Eddie Candle M.D.   On: 03/23/2020 13:32    Procedures .Critical Care Performed by: Domenic Moras, PA-C Authorized by: Domenic Moras, PA-C   Critical care provider statement:    Critical care time (minutes):  45   Critical care was time spent personally by me on the following activities:  Discussions with consultants, evaluation of patient's response to treatment, examination of patient, ordering and performing treatments and interventions, ordering and review of laboratory studies, ordering and review of radiographic studies, pulse oximetry, re-evaluation of  patient's condition, obtaining history from patient or surrogate and review of old charts   (including critical care time)  Medications Ordered in ED Medications  vancomycin (VANCOREADY) IVPB 1500 mg/300 mL (has no administration in time range)  ceFEPIme (MAXIPIME) 2 g in sodium chloride 0.9 % 100 mL IVPB (has no administration in time range)  vancomycin (VANCOCIN) IVPB 1000 mg/200 mL premix (has no administration in time range)  ibuprofen (ADVIL) tablet 400 mg (has no administration in time range)  sodium chloride 0.9 % bolus 1,000 mL (0 mLs Intravenous Stopped 03/23/20 1448)  ceFEPIme (MAXIPIME) 2 g in sodium chloride 0.9 % 100 mL IVPB (0 g Intravenous Stopped 03/23/20 1231)  metroNIDAZOLE (FLAGYL) IVPB 500 mg (0 mg Intravenous Stopped 03/23/20 1321)  sodium chloride 0.9 % bolus 1,000 mL (1,000 mLs Intravenous New Bag/Given 03/23/20 1448)  sodium chloride 0.9 % bolus 500 mL (0 mLs Intravenous Stopped 03/23/20 1448)  acetaminophen (TYLENOL) tablet 1,000 mg (1,000 mg Oral Given 03/23/20 1227)  iohexol (OMNIPAQUE) 300 MG/ML solution 100 mL (100 mLs Intravenous Contrast Given 03/23/20 1232)    ED Course  I have reviewed the triage vital signs and the nursing notes.  Pertinent labs & imaging results that were available during my care of the patient were reviewed by me and considered in my medical decision making (see chart for details).    MDM Rules/Calculators/A&P                      BP 126/71   Pulse (!) 132   Temp (!) 103.1 F (39.5 C) (Rectal)   Resp (!) 26   Ht 5' 10"  (1.778 m)   Wt 79.7 kg   SpO2 93%   BMI 25.21 kg/m   Final Clinical Impression(s) / ED Diagnoses Final diagnoses:  Sepsis, due to unspecified organism, unspecified whether acute organ dysfunction present ([place])  Abrasion of left elbow,  initial encounter  Contusion of face, initial encounter  Diarrhea of presumed infectious origin  Atrial fibrillation with RVR ([place])  Fall at nursing home, initial encounter  Wedge  compression fracture of t11-T12 vertebra, initial encounter for closed fracture ([place])    Rx / DC Orders ED Discharge Orders    None     10:59 AM Patient has unwitnessed fall when he went from this morning, was laying on the ground with stool everywhere.  Upon arrival to the ED, patient was found to be febrile with a temperature of 103.1, tachycardic with heart rate of 136, tachypneic with respiratory rate of 29, and not hypoxic with an O2 sats of 90% on room air.  Submental oxygen given.  Patient has a c-collar in place.  Code sepsis initiated and broad-spectrum antibiotic given.  Will also obtain stool test for C. difficile.  Tylenol given for fever.  Patient does have known history of CHF however due to tachycardia, and lactic acid of 3.3, as well as an elevated white count of 14.8, will fluid resuscitate at 30 mL/kg.  3:35 PM EKG shows A. fib with RVR but in the setting of sepsis, will continue with IV hydration and will recheck.  Patient's Covid test is negative.  C. difficile test is negative as well.  CT scan of the head and cervical spine without acute finding.  Abdominal and pelvis CT scan demonstrates no acute traumatic injury of the organs of the abdomen or pelvis.  There is a new although age-indeterminate wedge deformity of the T12 vertebral body with approximately 25% anterior height loss.  Colon is fluid-filled consistent with diarrhea illness.  I suspect patient sepsis is likely GI in origin.  I appreciate consultation with [facility] hospitalist, Dr. [name] who agrees to admit patient.  MONTEE TALLMAN was evaluated in Emergency Department on 03/23/2020 for the symptoms described in the history of present illness. He was evaluated in the context of the global COVID-19 pandemic, which necessitated consideration that the patient might be at risk for infection with the SARS-CoV-2 virus that causes COVID-19. Institutional protocols and algorithms that pertain to the evaluation of patients at  risk for COVID-19 are in a state of rapid change based on information released by regulatory bodies including the CDC and federal and state organizations. These policies and algorithms were followed during the patient's care in the ED.    Domenic Moras, PA-C 03/23/20 1540    Tegeler, Gwenyth Allegra, MD 03/24/20 [phone]

## 2020-03-23 NOTE — ED Triage Notes (Signed)
Pt from [facility] with ems for unwitnessed fall in the bathroom. Pt has hx of dementia and does not remember falling. Pt also had one episode of vomiting and diarrhea after the fall. Facility does report a GI bug going around the facility. Pt denies any pain. C collar in place. Pt given 267m fluid and 430mzofran en route.    BP 180/100 HR 136 RR30 Temp 98.8

## 2020-03-23 NOTE — ED Notes (Addendum)
Family member and pt states pt complaning of right shoulder pain. Kehinde RN made aware.

## 2020-03-24 LAB — CBC
HCT: 41.4 % (ref 39.0–52.0)
Hemoglobin: 13.5 g/dL (ref 13.0–17.0)
MCH: 29.4 pg (ref 26.0–34.0)
MCHC: 32.6 g/dL (ref 30.0–36.0)
MCV: 90.2 fL (ref 80.0–100.0)
Platelets: 178 10*3/uL (ref 150–400)
RBC: 4.59 MIL/uL (ref 4.22–5.81)
RDW: 15.6 % — ABNORMAL HIGH (ref 11.5–15.5)
WBC: 11.5 10*3/uL — ABNORMAL HIGH (ref 4.0–10.5)
nRBC: 0 % (ref 0.0–0.2)

## 2020-03-24 LAB — URINE CULTURE: Culture: NO GROWTH

## 2020-03-24 LAB — HEMOGLOBIN A1C
Hgb A1c MFr Bld: 7 % — ABNORMAL HIGH (ref 4.8–5.6)
Mean Plasma Glucose: 154.2 mg/dL

## 2020-03-24 LAB — BASIC METABOLIC PANEL
Anion gap: 7 (ref 5–15)
BUN: 17 mg/dL (ref 8–23)
CO2: 22 mmol/L (ref 22–32)
Calcium: 7.7 mg/dL — ABNORMAL LOW (ref 8.9–10.3)
Chloride: 109 mmol/L (ref 98–111)
Creatinine, Ser: 1.17 mg/dL (ref 0.61–1.24)
GFR calc Af Amer: >60 mL/min (ref >60)
GFR calc non Af Amer: 57 mL/min — ABNORMAL LOW (ref >60)
Glucose, Bld: 176 mg/dL — ABNORMAL HIGH (ref 70–99)
Potassium: 3.7 mmol/L (ref 3.5–5.1)
Sodium: 138 mmol/L (ref 135–145)

## 2020-03-24 LAB — LACTIC ACID, PLASMA: Lactic Acid, Venous: 1.5 mmol/L (ref 0.5–1.9)

## 2020-03-24 LAB — MAGNESIUM: Magnesium: 1.7 mg/dL (ref 1.7–2.4)

## 2020-03-24 LAB — GLUCOSE, CAPILLARY
Glucose-Capillary: 142 mg/dL — ABNORMAL HIGH (ref 70–99)
Glucose-Capillary: 135 mg/dL — ABNORMAL HIGH (ref 70–99)
Glucose-Capillary: 131 mg/dL — ABNORMAL HIGH (ref 70–99)

## 2020-03-24 MED ORDER — POTASSIUM CHLORIDE CRYS ER 20 MEQ PO TBCR
40.0000 meq | EXTENDED_RELEASE_TABLET | Freq: Once | ORAL | Status: AC
  Administered 2020-03-24: 40 meq via ORAL
  Filled 2020-03-24: qty 2

## 2020-03-24 MED ORDER — INSULIN ASPART 100 UNIT/ML ~~LOC~~ SOLN
0.0000 [IU] | Freq: Three times a day (TID) | SUBCUTANEOUS | Status: DC
  Administered 2020-03-24: 3 [IU] via SUBCUTANEOUS
  Administered 2020-03-24: 2 [IU] via SUBCUTANEOUS

## 2020-03-24 MED ORDER — MAGNESIUM SULFATE IN D5W 1-5 GM/100ML-% IV SOLN
1.0000 g | Freq: Once | INTRAVENOUS | Status: AC
  Administered 2020-03-24: 1 g via INTRAVENOUS
  Filled 2020-03-24: qty 100

## 2020-03-24 MED ORDER — NYSTATIN 100000 UNIT/ML MT SUSP
5.0000 mL | Freq: Four times a day (QID) | OROMUCOSAL | Status: DC
  Administered 2020-03-24 – 2020-03-25 (×3): 500000 [IU] via ORAL
  Filled 2020-03-24 (×3): qty 5

## 2020-03-24 MED ORDER — INSULIN ASPART 100 UNIT/ML ~~LOC~~ SOLN: 0.0000 [IU] | Freq: Every day | SUBCUTANEOUS | Status: DC

## 2020-03-24 NOTE — Progress Notes (Addendum)
PROGRESS NOTE    Anthony [name]  PPI:[id number] DOB: Oct 11, 1934 DOA: 03/23/2020 PCP: System, Pcp Not In   Brief Narrative:  Per HPI: Anthony [name] is a 86 y.o. male with medical history significant of hypertension, hyperlipidemia, coronary artery disease, Crohn's disease, and GERD who presents after having a unwitnessed fall in the bathroom.  Patient has poor short-term memory at baseline, but is able to recognize family.  History is obtained from review of records and the patient's daughter who is present at bedside.  Apparently there had been a GI bug going around the facility.  Started last on yesterday reported that she requested for him to have laxatives as she thought he was possibly constipated due to abdominal distention and complaints of abdominal pain.  Currently patient denies any reports of any pain.  At some point patient had had a large bowel movement as stool was reported all of the ground when EMS arrived today.  Daughter noted that he sustained significance skin tear to his left elbow and had bruising along the left side of his face.  He had not been on any antibiotics recently, but had been started on IV Lasix on 4/27 for lower extremity swelling in addition to his Lasix 40 mg p.o. that he normally takes daily.  5/2: Patient appears to have improvement in sepsis physiology, but needs ongoing IV fluid.  He did have loose bowel movement overnight.  He does not appear to have C. difficile and given the history of likely viral gastroenteritis at his facility, I will discontinue current IV antibiotics and monitor closely.  GI pathogen panel pending.  PT evaluation obtained and pending given recent fall.  Continue on IV fluid for treatment of AKI.  He will also need ongoing wound care to his left arm.  Patient is from [facility].  Assessment & Plan:   Principal Problem:   Sepsis ([place]) Active Problems:   GERD   Rheumatoid arthritis ([place])   HLD (hyperlipidemia)  Essential hypertension   Chronic systolic heart failure ([facility])   Crohn's disease of large bowel ([facility])   Diarrhea   Hypokalemia   Sepsis secondary to suspected GI pathogen: Patient presented after having a fall.  He was found to be febrile up to 103 F with tachycardia and tachypnea.  Labs significant for WBC elevated at 14.8 and lactic acid up to 3.4.daughter reported GI bug going around the facility where he lives.  C. difficile screening was negative.  Patient had been initially started on empiric antibiotics of vancomycin, metronidazole, and cefepime.  CT imaging of the abdomen and pelvis noted concern for possible diarrheal illness. -Follow-up blood culture with no growth noted in last 24 hours -Follow-up GI panel is currently pending -Discontinue empiric antibiotics given likely source being viral; with outbreak noted in facility -Tylenol as needed for fever. -Lactic acidosis has down trended, follow a.m. -Recheck CBC in a.m.  Acute kidney injury: Patient baseline creatinine previously noted to be around 0.8, patient presents with creatinine elevated up to 1.2 with BUN 18.  Patient has been on IV Lasix in addition to p.o. Lasix.  -Continue on IV fluids and hold diuretics -Creatinine is currently 1.17 -Plan to repeat renal panel in a.m.  Acute hypoxemic respiratory failure -Currently on 3 L nasal cannula oxygen -No findings on chest x-ray noted -Plan to start incentive spirometry and wean as tolerated  Left arm abrasion status post fall -Ongoing wound care  Oral thrush -Start Nystatin 5/2  Episode of non-sustained  Vtach -Soft blood pressures and therefore will hold on B-blocker -Supplement Mg and K for goal of 2 and 4 respectively. Recheck in am -2D echocardiogram ordered; prior in 2015  Essential hypertension-currently with soft blood pressure readings -Hold current blood pressure medications  Chronic systolic congestive heart failure -Strict intake and output and daily  weights -Appears dry and will need ongoing IV fluid for now -2D echo as noted above  Crohn's disease: Imaging studies did not show signs of an active flare of patient's Crohn's disease -Continue mesalamine  Rheumatoid arthritis, chronic steroid use -Continue prednisone   History of diabetes mellitus type 2-with mild hyperglycemia -Likely, somewhat steroid-induced well on prednisone -Start on SSI and monitor  Dementia, without behavioral disturbance -Continue Aricept  Hyperlipidemia -Continue statin  Recent fall, debility: At baseline patient helps some with transfers.  No acute fractures noted on imaging studies.  -X-rays with no acute fractures -CT demonstrates age-indeterminate compression fracture of T12, but with no acute pain and therefore no need for kyphoplasty/vertebroplasty  GERD -Continue pharmacy substitution for omeprazole   DVT prophylaxis: Lovenox Code Status: DNR Family Communication: Discussed with daughter at bedside Disposition Plan: Continue IV fluid resuscitation for AKI and monitor stool output.  GI panel pending.  PT evaluation ordered and pending.  Discontinued IV antibiotics today.  Follow a.m. labs.  Anticipate admission for at least another 24-48 hours prior to discharge.   Consultants:   None  Procedures:   None  Antimicrobials:  Anti-infectives (From admission, onward)   Start     Dose/Rate Route Frequency Ordered Stop   03/24/20 0000  vancomycin (VANCOCIN) IVPB 1000 mg/200 mL premix  Status:  Discontinued     1,000 mg 200 mL/hr over 60 Minutes Intravenous Every 12 hours 03/23/20 1157 03/24/20 0940   03/23/20 2200  ceFEPIme (MAXIPIME) 2 g in sodium chloride 0.9 % 100 mL IVPB  Status:  Discontinued     2 g 200 mL/hr over 30 Minutes Intravenous Every 12 hours 03/23/20 1157 03/24/20 1002   03/23/20 1100  ceFEPIme (MAXIPIME) 2 g in sodium chloride 0.9 % 100 mL IVPB     2 g 200 mL/hr over 30 Minutes Intravenous  Once 03/23/20 1029  03/23/20 1231   03/23/20 1100  vancomycin (VANCOREADY) IVPB 1500 mg/300 mL     1,500 mg 150 mL/hr over 120 Minutes Intravenous  Once 03/23/20 1052 03/23/20 1847   03/23/20 1030  metroNIDAZOLE (FLAGYL) IVPB 500 mg     500 mg 100 mL/hr over 60 Minutes Intravenous  Once 03/23/20 1029 03/23/20 1321   03/23/20 1030  vancomycin (VANCOCIN) IVPB 1000 mg/200 mL premix  Status:  Discontinued     1,000 mg 200 mL/hr over 60 Minutes Intravenous  Once 03/23/20 1029 03/23/20 1052       Subjective: Patient seen and evaluated today and is somnolent.  He did not rest well last night.  He denies any abdominal pain.  He did have loose bowel movements noted last night.  No nausea or vomiting noted.  Objective: Vitals:   03/23/20 1733 03/23/20 1833 03/24/20 0017 03/24/20 0613  BP:  (!) 99/56 (!) 91/59 99/61  Pulse:  95 90 86  Resp:  (!) 23 19 20   Temp: 98.4 F (36.9 C) 97.7 F (36.5 C) 98.2 F (36.8 C) 98.3 F (36.8 C)  TempSrc: Oral Oral Axillary Oral  SpO2:  93% 95% 91%  Weight:    92.5 kg  Height:        Intake/Output Summary (Last 24  hours) at 03/24/2020 1006 Last data filed at 03/23/2020 2241 Gross per 24 hour  Intake 3 ml  Output 200 ml  Net -197 ml   Filed Weights   03/23/20 1002 03/24/20 0613  Weight: 79.7 kg 92.5 kg    Examination:  General exam: Appears calm and comfortable, somnolent Respiratory system: Clear to auscultation. Respiratory effort normal.  Currently on 3 L nasal cannula oxygen Cardiovascular system: S1 & S2 heard, RRR. No JVD, murmurs, rubs, gallops or clicks. No pedal edema. Gastrointestinal system: Abdomen is nondistended, soft and nontender. No organomegaly or masses felt. Normal bowel sounds heard. Central nervous system: Somnolent Extremities: No edema noted.  Left arm in dressing. Skin: No rashes, lesions or ulcers Psychiatry: Cannot be assessed    Data Reviewed: I have personally reviewed following labs and imaging studies  CBC: Recent Labs  Lab  03/23/20 1022 03/24/20 0525  WBC 14.8* 11.5*  NEUTROABS 13.4*  --   HGB 17.2* 13.5  HCT 52.3* 41.4  MCV 90.8 90.2  PLT 231 366   Basic Metabolic Panel: Recent Labs  Lab 03/23/20 1022 03/24/20 0525  NA 138 138  K 3.2* 3.7  CL 97* 109  CO2 24 22  GLUCOSE 153* 176*  BUN 18 17  CREATININE 1.20 1.17  CALCIUM 8.8* 7.7*  MG  --  1.7   GFR: Estimated Creatinine Clearance: 53.7 mL/min (by C-G formula based on SCr of 1.17 mg/dL). Liver Function Tests: Recent Labs  Lab 03/23/20 1022  AST 26  ALT 20  ALKPHOS 64  BILITOT 1.3*  PROT 7.0  ALBUMIN 3.6   No results for input(s): LIPASE, AMYLASE in the last 168 hours. No results for input(s): AMMONIA in the last 168 hours. Coagulation Profile: Recent Labs  Lab 03/23/20 1022  INR 1.1   Cardiac Enzymes: No results for input(s): CKTOTAL, CKMB, CKMBINDEX, TROPONINI in the last 168 hours. BNP (last 3 results) No results for input(s): PROBNP in the last 8760 hours. HbA1C: No results for input(s): HGBA1C in the last 72 hours. CBG: No results for input(s): GLUCAP in the last 168 hours. Lipid Profile: No results for input(s): CHOL, HDL, LDLCALC, TRIG, CHOLHDL, LDLDIRECT in the last 72 hours. Thyroid Function Tests: No results for input(s): TSH, T4TOTAL, FREET4, T3FREE, THYROIDAB in the last 72 hours. Anemia Panel: No results for input(s): VITAMINB12, FOLATE, FERRITIN, TIBC, IRON, RETICCTPCT in the last 72 hours. Sepsis Labs: Recent Labs  Lab 03/23/20 1507 03/23/20 1828 03/23/20 2008 03/24/20 0710  LATICACIDVEN 3.4* 2.2* 2.2* 1.5    Recent Results (from the past 240 hour(s))  Blood Culture (routine x 2)     Status: None (Preliminary result)   Collection Time: 03/23/20 10:22 AM   Specimen: BLOOD RIGHT FOREARM  Result Value Ref Range Status   Specimen Description BLOOD RIGHT FOREARM  Final   Special Requests   Final    BOTTLES DRAWN AEROBIC AND ANAEROBIC Blood Culture adequate volume   Culture   Final    NO GROWTH <  24 HOURS Performed at [facility], [place] [address] [place],  [address]    Report Status PENDING  Incomplete  Blood Culture (routine x 2)     Status: None (Preliminary result)   Collection Time: 03/23/20 10:48 AM   Specimen: BLOOD RIGHT HAND  Result Value Ref Range Status   Specimen Description BLOOD RIGHT HAND  Final   Special Requests   Final    BOTTLES DRAWN AEROBIC AND ANAEROBIC Blood Culture results may not  be optimal due to an inadequate volume of blood received in culture bottles   Culture   Final    NO GROWTH < 24 HOURS Performed at [facility] [address] [place], [place] [address]    Report Status PENDING  Incomplete  C Difficile Quick Screen w PCR reflex     Status: None   Collection Time: 03/23/20 11:28 AM   Specimen: STOOL  Result Value Ref Range Status   C Diff antigen NEGATIVE NEGATIVE Final   C Diff toxin NEGATIVE NEGATIVE Final   C Diff interpretation No C. difficile detected.  Final    Comment: Performed at [facility], [place] [address], [place], [place] [address]  Respiratory Panel by RT PCR (Flu A&B, Covid) - Nasopharyngeal Swab     Status: None   Collection Time: 03/23/20  1:28 PM   Specimen: Nasopharyngeal Swab  Result Value Ref Range Status   SARS Coronavirus 2 by RT PCR NEGATIVE NEGATIVE Final    Comment: (NOTE) SARS-CoV-2 target nucleic acids are NOT DETECTED. The SARS-CoV-2 RNA is generally detectable in upper respiratoy specimens during the acute phase of infection. The lowest concentration of SARS-CoV-2 viral copies this assay can detect is 131 copies/mL. A negative result does not preclude SARS-Cov-2 infection and should not be used as the sole basis for treatment or other patient management decisions. A negative result may occur with  improper specimen collection/handling, submission of specimen other than nasopharyngeal swab, presence of viral mutation(s) within the areas targeted by this assay, and inadequate number  of viral copies (<131 copies/mL). A negative result must be combined with clinical observations, patient history, and epidemiological information. The expected result is Negative. Fact Sheet for Patients:  PinkCheek.be Fact Sheet for Healthcare Providers:  GravelBags.it This test is not yet ap proved or cleared by the [place] FDA and  has been authorized for detection and/or diagnosis of SARS-CoV-2 by FDA under an Emergency Use Authorization (EUA). This EUA will remain  in effect (meaning this test can be used) for the duration of the COVID-19 declaration under Section 564(b)(1) of the Act, 21 U.S.C. section 360bbb-3(b)(1), unless the authorization is terminated or revoked sooner.    Influenza A by PCR NEGATIVE NEGATIVE Final   Influenza B by PCR NEGATIVE NEGATIVE Final    Comment: (NOTE) The Xpert Xpress SARS-CoV-2/FLU/RSV assay is intended as an aid in  the diagnosis of influenza from Nasopharyngeal swab specimens and  should not be used as a sole basis for treatment. Nasal washings and  aspirates are unacceptable for Xpert Xpress SARS-CoV-2/FLU/RSV  testing. Fact Sheet for Patients: PinkCheek.be Fact Sheet for Healthcare Providers: GravelBags.it This test is not yet approved or cleared by the [place] FDA and  has been authorized for detection and/or diagnosis of SARS-CoV-2 by  FDA under an Emergency Use Authorization (EUA). This EUA will remain  in effect (meaning this test can be used) for the duration of the  Covid-19 declaration under Section 564(b)(1) of the Act, 21  U.S.C. section 360bbb-3(b)(1), unless the authorization is  terminated or revoked. Performed at [facility], [place] [address], [place], [place] [address]          Radiology Studies: DG Elbow Complete Left  Result Date: 03/23/2020 CLINICAL DATA:  Unwitnessed fall in  bathroom EXAM: LEFT ELBOW - COMPLETE 3+ VIEW COMPARISON:  None FINDINGS: Osseous demineralization. Olecranon spur present. Radiocapitellar joint space narrowing with spurring at radial head. Question small elbow joint effusion.  No acute fracture, dislocation, or bone destruction. IMPRESSION: Radiocapitellar degenerative changes. Question small joint effusion. No definite acute fracture or dislocation identified. Electronically Signed   By: Lavonia Dana M.D.   On: 03/23/2020 13:16   CT Head Wo Contrast  Result Date: 03/23/2020 CLINICAL DATA:  Head trauma, unwitnessed fall EXAM: CT HEAD WITHOUT CONTRAST CT MAXILLOFACIAL WITHOUT CONTRAST CT CERVICAL SPINE WITHOUT CONTRAST TECHNIQUE: Multidetector CT imaging of the head, cervical spine, and maxillofacial structures were performed using the standard protocol without intravenous contrast. Multiplanar CT image reconstructions of the cervical spine and maxillofacial structures were also generated. COMPARISON:  11/05/2014, thyroid ultrasound, 06/08/2011 FINDINGS: CT HEAD FINDINGS Brain: No evidence of acute infarction, hemorrhage, hydrocephalus, extra-axial collection or mass lesion/mass effect. Extensive periventricular and deep white matter hypodensity. Prominence of the lateral ventricles, likely due to global volume loss. Vascular: No hyperdense vessel or unexpected calcification. CT FACIAL BONES FINDINGS Skull: Normal. Negative for fracture or focal lesion. Facial bones: No displaced fractures or dislocations. Sinuses/Orbits: No acute finding. Other: None. CT CERVICAL SPINE FINDINGS Alignment: Normal. Skull base and vertebrae: No acute fracture. No primary bone lesion or focal pathologic process. Soft tissues and spinal canal: No prevertebral fluid or swelling. No visible canal hematoma. Disc levels: Moderate to severe multilevel disc degenerative and osteophytosis of the cervical spine, with multilevel partial ankylosis. Upper chest: Negative. Other: Enlarged,  heterogeneous thyroid, particularly the left lobe, in keeping with appearance on ultrasound dated 06/08/2011 at which time no discrete nodule was identified. IMPRESSION: 1.  No acute intracranial pathology. 2. Extensive periventricular and deep white matter hypodensity with prominence of the lateral ventricles, likely due to advanced small-vessel white matter disease and global volume loss. Prominence of the lateral ventricles is however significantly increased compared to prior examination dated 11/05/2014 and normal pressure hydrocephalus is not strictly excluded. Correlate with referable clinical symptoms, if present. 3.  No displaced fracture or dislocation of the facial bones. 4.  No fracture or static subluxation of the cervical spine. Electronically Signed   By: Eddie Candle M.D.   On: 03/23/2020 13:32   CT Cervical Spine Wo Contrast  Result Date: 03/23/2020 CLINICAL DATA:  Head trauma, unwitnessed fall EXAM: CT HEAD WITHOUT CONTRAST CT MAXILLOFACIAL WITHOUT CONTRAST CT CERVICAL SPINE WITHOUT CONTRAST TECHNIQUE: Multidetector CT imaging of the head, cervical spine, and maxillofacial structures were performed using the standard protocol without intravenous contrast. Multiplanar CT image reconstructions of the cervical spine and maxillofacial structures were also generated. COMPARISON:  11/05/2014, thyroid ultrasound, 06/08/2011 FINDINGS: CT HEAD FINDINGS Brain: No evidence of acute infarction, hemorrhage, hydrocephalus, extra-axial collection or mass lesion/mass effect. Extensive periventricular and deep white matter hypodensity. Prominence of the lateral ventricles, likely due to global volume loss. Vascular: No hyperdense vessel or unexpected calcification. CT FACIAL BONES FINDINGS Skull: Normal. Negative for fracture or focal lesion. Facial bones: No displaced fractures or dislocations. Sinuses/Orbits: No acute finding. Other: None. CT CERVICAL SPINE FINDINGS Alignment: Normal. Skull base and vertebrae:  No acute fracture. No primary bone lesion or focal pathologic process. Soft tissues and spinal canal: No prevertebral fluid or swelling. No visible canal hematoma. Disc levels: Moderate to severe multilevel disc degenerative and osteophytosis of the cervical spine, with multilevel partial ankylosis. Upper chest: Negative. Other: Enlarged, heterogeneous thyroid, particularly the left lobe, in keeping with appearance on ultrasound dated 06/08/2011 at which time no discrete nodule was identified. IMPRESSION: 1.  No acute intracranial pathology. 2. Extensive periventricular and deep white matter hypodensity with prominence of the lateral ventricles, likely  due to advanced small-vessel white matter disease and global volume loss. Prominence of the lateral ventricles is however significantly increased compared to prior examination dated 11/05/2014 and normal pressure hydrocephalus is not strictly excluded. Correlate with referable clinical symptoms, if present. 3.  No displaced fracture or dislocation of the facial bones. 4.  No fracture or static subluxation of the cervical spine. Electronically Signed   By: Eddie Candle M.D.   On: 03/23/2020 13:32   CT ABDOMEN PELVIS W CONTRAST  Result Date: 03/23/2020 CLINICAL DATA:  Abdominal pain, unwitnessed fall EXAM: CT ABDOMEN AND PELVIS WITH CONTRAST TECHNIQUE: Multidetector CT imaging of the abdomen and pelvis was performed using the standard protocol following bolus administration of intravenous contrast. CONTRAST:  170m OMNIPAQUE IOHEXOL 300 MG/ML  SOLN COMPARISON:  04/04/2018 FINDINGS: Lower chest: No acute abnormality. Dependent bibasilar scarring and or atelectasis. Three-vessel coronary artery calcifications. Hepatobiliary: No solid liver abnormality is seen. No gallstones, gallbladder wall thickening, or biliary dilatation. Pancreas: Unremarkable. No pancreatic ductal dilatation or surrounding inflammatory changes. Spleen: Normal in size without significant  abnormality. Adrenals/Urinary Tract: Adrenal glands are unremarkable. Kidneys are normal, without renal calculi, solid lesion, or hydronephrosis. Bladder is unremarkable. Stomach/Bowel: Stomach is within normal limits. Appendix appears normal. No evidence of bowel wall thickening, distention, or inflammatory changes. Colon is fluid-filled to the rectum. Vascular/Lymphatic: Aortic atherosclerosis. No enlarged abdominal or pelvic lymph nodes. Reproductive: No mass or other significant abnormality. Other: Fat containing bilateral inguinal hernias. No abdominopelvic ascites. Musculoskeletal: There is a new, although age-indeterminate wedge deformity of the T12 vertebral body with approximately 25% anterior height loss. IMPRESSION: 1. No evidence of acute traumatic injury to the organs of the abdomen or pelvis. 2. There is a new, although age-indeterminate wedge deformity of the T12 vertebral body with approximately 25% anterior height loss. 3. The colon is fluid-filled to the rectum, in keeping with diarrheal illness. 4.  Coronary artery disease.  Aortic Atherosclerosis (ICD10-I70.0). Electronically Signed   By: AEddie CandleM.D.   On: 03/23/2020 13:37   DG Chest Port 1 View  Result Date: 03/23/2020 CLINICAL DATA:  Sepsis, unwitnessed fall in bathroom, dementia, single episode of vomiting after fall EXAM: PORTABLE CHEST 1 VIEW COMPARISON:  Portable exam 1151 hours compared to 03/01/2019 FINDINGS: Normal heart size, mediastinal contours, and pulmonary vascularity. Low lung volumes with bibasilar atelectasis. Lungs otherwise clear. No infiltrate, pleural effusion, or pneumothorax. Bones demineralized. IMPRESSION: Low lung volumes with bibasilar atelectasis. Electronically Signed   By: MLavonia DanaM.D.   On: 03/23/2020 13:09   DG Shoulder Right Port  Result Date: 03/23/2020 CLINICAL DATA:  Shoulder pain EXAM: PORTABLE RIGHT SHOULDER COMPARISON:  None. FINDINGS: There are moderate degenerative changes of the right  glenohumeral joint without evidence for an acute displaced fracture or dislocation. There is atelectasis versus scarring in the right lung base. No evidence for an acute displaced right-sided rib fracture involving the visualized right-sided ribs. IMPRESSION: 1. No evidence for an acute displaced fracture or dislocation. 2. Moderate right glenohumeral degenerative changes. Electronically Signed   By: CConstance HolsterM.D.   On: 03/23/2020 19:26   CT Maxillofacial Wo Contrast  Result Date: 03/23/2020 CLINICAL DATA:  Head trauma, unwitnessed fall EXAM: CT HEAD WITHOUT CONTRAST CT MAXILLOFACIAL WITHOUT CONTRAST CT CERVICAL SPINE WITHOUT CONTRAST TECHNIQUE: Multidetector CT imaging of the head, cervical spine, and maxillofacial structures were performed using the standard protocol without intravenous contrast. Multiplanar CT image reconstructions of the cervical spine and maxillofacial structures were also generated. COMPARISON:  11/05/2014,  thyroid ultrasound, 06/08/2011 FINDINGS: CT HEAD FINDINGS Brain: No evidence of acute infarction, hemorrhage, hydrocephalus, extra-axial collection or mass lesion/mass effect. Extensive periventricular and deep white matter hypodensity. Prominence of the lateral ventricles, likely due to global volume loss. Vascular: No hyperdense vessel or unexpected calcification. CT FACIAL BONES FINDINGS Skull: Normal. Negative for fracture or focal lesion. Facial bones: No displaced fractures or dislocations. Sinuses/Orbits: No acute finding. Other: None. CT CERVICAL SPINE FINDINGS Alignment: Normal. Skull base and vertebrae: No acute fracture. No primary bone lesion or focal pathologic process. Soft tissues and spinal canal: No prevertebral fluid or swelling. No visible canal hematoma. Disc levels: Moderate to severe multilevel disc degenerative and osteophytosis of the cervical spine, with multilevel partial ankylosis. Upper chest: Negative. Other: Enlarged, heterogeneous thyroid,  particularly the left lobe, in keeping with appearance on ultrasound dated 06/08/2011 at which time no discrete nodule was identified. IMPRESSION: 1.  No acute intracranial pathology. 2. Extensive periventricular and deep white matter hypodensity with prominence of the lateral ventricles, likely due to advanced small-vessel white matter disease and global volume loss. Prominence of the lateral ventricles is however significantly increased compared to prior examination dated 11/05/2014 and normal pressure hydrocephalus is not strictly excluded. Correlate with referable clinical symptoms, if present. 3.  No displaced fracture or dislocation of the facial bones. 4.  No fracture or static subluxation of the cervical spine. Electronically Signed   By: Eddie Candle M.D.   On: 03/23/2020 13:32        Scheduled Meds: . aspirin  81 mg Oral BID PC  . donepezil  5 mg Oral QHS  . enoxaparin (LOVENOX) injection  40 mg Subcutaneous Q24H  . FLUoxetine  10 mg Oral Daily  . ketoconazole  1 application Topical BID  . LORazepam  0.5 mg Oral 2 times per day  . mesalamine  2.4 g Oral BID  . oxybutynin  5 mg Oral Q12H  . pantoprazole  40 mg Oral Daily  . pravastatin  40 mg Oral QHS  . predniSONE  10 mg Oral Daily  . primidone  50 mg Oral QHS  . sodium chloride flush  3 mL Intravenous Q12H  . tamsulosin  0.4 mg Oral QHS   Continuous Infusions: . sodium chloride 75 mL/hr at 03/24/20 0653     LOS: 1 day    Time spent: 35 minutes    Russie Gulledge Darleen Crocker, DO [facility]  If 7PM-7AM, please contact night-coverage [email or web address] 03/24/2020, 10:06 AM

## 2020-03-24 NOTE — Plan of Care (Deleted)
  Problem: Education: Goal: Knowledge of disease or condition will improve Outcome: Progressing  Pt is aware he is in a-fib. He has a history of it.   Goal: Understanding of medication regimen will improve Outcome: Progressing  Pt knows he's on a cardiazem drip & is aware that he takes this medication by mouth outpatient.    Problem: Cardiac: Goal: Ability to achieve and maintain adequate cardiopulmonary perfusion will improve Outcome: Progressing  Pt is on RA & his hr is maintaining below 110.

## 2020-03-24 NOTE — Progress Notes (Signed)
Md notified of pt having 11 beats of non-sustained vtach. Vs stable & charted. Pt feels okay. Pt's only complaint is throat & tongue pain d/t possible thrush. Spots were noticed on his tongue. New order for nystatin. Will continue to monitor the pt. Hoover Brunette, RN

## 2020-03-24 NOTE — Plan of Care (Signed)
  Problem: Education: Goal: Knowledge of General Education information will improve Description: Including pain rating scale, medication(s)/side effects and non-pharmacologic comfort measures Outcome: Progressing   Problem: Clinical Measurements: Goal: Ability to maintain clinical measurements within normal limits will improve Outcome: Progressing Goal: Will remain free from infection Outcome: Progressing   

## 2020-03-25 ENCOUNTER — Inpatient Hospital Stay (HOSPITAL_COMMUNITY): Payer: PPO

## 2020-03-25 DIAGNOSIS — I361 Nonrheumatic tricuspid (valve) insufficiency: Secondary | ICD-10-CM

## 2020-03-25 DIAGNOSIS — K50119 Crohn's disease of large intestine with unspecified complications: Secondary | ICD-10-CM

## 2020-03-25 DIAGNOSIS — I350 Nonrheumatic aortic (valve) stenosis: Secondary | ICD-10-CM

## 2020-03-25 LAB — GLUCOSE, CAPILLARY
Glucose-Capillary: 104 mg/dL — ABNORMAL HIGH (ref 70–99)
Glucose-Capillary: 92 mg/dL (ref 70–99)

## 2020-03-25 LAB — CBC
HCT: 37.8 % — ABNORMAL LOW (ref 39.0–52.0)
Hemoglobin: 12.2 g/dL — ABNORMAL LOW (ref 13.0–17.0)
MCH: 30.1 pg (ref 26.0–34.0)
MCHC: 32.3 g/dL (ref 30.0–36.0)
MCV: 93.3 fL (ref 80.0–100.0)
Platelets: 160 10*3/uL (ref 150–400)
RBC: 4.05 MIL/uL — ABNORMAL LOW (ref 4.22–5.81)
RDW: 15.7 % — ABNORMAL HIGH (ref 11.5–15.5)
WBC: 8.3 10*3/uL (ref 4.0–10.5)
nRBC: 0 % (ref 0.0–0.2)

## 2020-03-25 LAB — LACTIC ACID, PLASMA: Lactic Acid, Venous: 1.7 mmol/L (ref 0.5–1.9)

## 2020-03-25 LAB — MAGNESIUM: Magnesium: 1.9 mg/dL (ref 1.7–2.4)

## 2020-03-25 LAB — BASIC METABOLIC PANEL
Anion gap: 8 (ref 5–15)
BUN: 11 mg/dL (ref 8–23)
CO2: 21 mmol/L — ABNORMAL LOW (ref 22–32)
Calcium: 7.9 mg/dL — ABNORMAL LOW (ref 8.9–10.3)
Chloride: 107 mmol/L (ref 98–111)
Creatinine, Ser: 1 mg/dL (ref 0.61–1.24)
GFR calc Af Amer: >60 mL/min (ref >60)
GFR calc non Af Amer: >60 mL/min (ref >60)
Glucose, Bld: 111 mg/dL — ABNORMAL HIGH (ref 70–99)
Potassium: 3.8 mmol/L (ref 3.5–5.1)
Sodium: 136 mmol/L (ref 135–145)

## 2020-03-25 LAB — ECHOCARDIOGRAM COMPLETE
Height: 70 in
Weight: 3262.81 oz

## 2020-03-25 MED ORDER — PERFLUTREN LIPID MICROSPHERE
1.0000 mL | INTRAVENOUS | Status: AC | PRN
  Administered 2020-03-25: 2 mL via INTRAVENOUS
  Filled 2020-03-25: qty 10

## 2020-03-25 NOTE — Evaluation (Signed)
Physical Therapy Evaluation Patient Details Name: Anthony [name] MRN: [id number] DOB: October 17, 1934 Today's Date: 03/25/2020   History of Present Illness  Patient is an 86 year old male admitted from facility after being found on floor. Admitted with sepsis. PMH includes: gerd, RA, HTN, HLD, CAD  Clinical Impression  Patient received in bed, agrees to PT assessment. Reports he is feeling fine. No significant complaints. He requires min assist with supine to sit and supervision for sitting at edge of bed. He requires min assist to perform sit to stand from elevated bed and cues for proper hand placement for safe transfers. He requires increased time to perform all mobility tasks. He was able to take a few steps along side of bed with rolling walker and min assist. Posture is flexed, but he is able to come more upright with cues. He was fatigued after this amount of activity. Patient will continue to benefit from skilled PT while here to improve strength and activity tolerance.      Follow Up Recommendations SNF;Supervision/Assistance - 24 hour    Equipment Recommendations  None recommended by PT    Recommendations for Other Services       Precautions / Restrictions Precautions Precautions: Fall Restrictions Weight Bearing Restrictions: No      Mobility  Bed Mobility Overal bed mobility: Needs Assistance Bed Mobility: Supine to Sit;Sit to Supine     Supine to sit: Min assist Sit to supine: Min assist   General bed mobility comments: min assist to raise trunk to seated position and get positioned on side of bed with feet on floor. He required min assist to bring LEs back up onto bed and with positioning in supine.  Transfers Overall transfer level: Needs assistance Equipment used: Rolling walker (2 wheeled) Transfers: Sit to/from Stand Sit to Stand: Mod assist;From elevated surface         General transfer comment: Cues needed for hand placement with  transfers  Ambulation/Gait Ambulation/Gait assistance: Min assist Gait Distance (Feet): 3 Feet Assistive device: Rolling walker (2 wheeled) Gait Pattern/deviations: Step-to pattern;Trunk flexed Gait velocity: decr   General Gait Details: able to take a few side steps along side of bed with min assist and cues for safety  Stairs            Wheelchair Mobility    Modified Rankin (Stroke Patients Only)       Balance Overall balance assessment: Needs assistance Sitting-balance support: Feet supported Sitting balance-Leahy Scale: Fair Sitting balance - Comments: posterior lean with sitting, cues needed to get feet on floor   Standing balance support: Bilateral upper extremity supported;During functional activity Standing balance-Leahy Scale: Fair Standing balance comment: reliant on RW and external assist for safety                             Pertinent Vitals/Pain Pain Assessment: No/denies pain    Home Living Family/patient expects to be discharged to:: Skilled nursing facility                      Prior Function Level of Independence: Needs assistance   Gait / Transfers Assistance Needed: Patient reports he does not ambulate at facility much. Requires assistance to get into chair.  ADL's / Homemaking Assistance Needed: Requires assist for ADLs at baseline        Hand Dominance   Dominant Hand: Right    Extremity/Trunk Assessment   Upper Extremity Assessment  Upper Extremity Assessment: Generalized weakness    Lower Extremity Assessment Lower Extremity Assessment: Generalized weakness    Cervical / Trunk Assessment Cervical / Trunk Assessment: Normal  Communication   Communication: No difficulties  Cognition Arousal/Alertness: Awake/alert Behavior During Therapy: Flat affect Overall Cognitive Status: No family/caregiver present to determine baseline cognitive functioning                                 General  Comments: able to follow direction well      General Comments      Exercises     Assessment/Plan    PT Assessment Patient needs continued PT services  PT Problem List Decreased strength;Decreased mobility;Decreased activity tolerance;Decreased balance;Decreased knowledge of use of DME       PT Treatment Interventions DME instruction;Therapeutic activities;Gait training;Therapeutic exercise;Functional mobility training;Neuromuscular re-education;Balance training;Patient/family education    PT Goals (Current goals can be found in the Care Plan section)  Acute Rehab PT Goals Patient Stated Goal: to return to facility PT Goal Formulation: With patient Time For Goal Achievement: 03/29/20 Potential to Achieve Goals: Good    Frequency Min 2X/week   Barriers to discharge        Co-evaluation               AM-PAC PT "6 Clicks" Mobility  Outcome Measure Help needed turning from your back to your side while in a flat bed without using bedrails?: A Little Help needed moving from lying on your back to sitting on the side of a flat bed without using bedrails?: A Little Help needed moving to and from a bed to a chair (including a wheelchair)?: A Lot Help needed standing up from a chair using your arms (e.g., wheelchair or bedside chair)?: A Little Help needed to walk in hospital room?: A Lot Help needed climbing 3-5 steps with a railing? : Total 6 Click Score: 14    End of Session Equipment Utilized During Treatment: Gait belt Activity Tolerance: Patient limited by fatigue Patient left: in bed;with bed alarm set;with call bell/phone within reach Nurse Communication: Mobility status PT Visit Diagnosis: Other abnormalities of gait and mobility (R26.89);History of falling (Z91.81);Unsteadiness on feet (R26.81);Muscle weakness (generalized) (M62.81)    Time: 8341-9622 PT Time Calculation (min) (ACUTE ONLY): 23 min   Charges:   PT Evaluation $PT Eval Moderate Complexity: 1  Mod PT Treatments $Therapeutic Activity: 8-22 mins        Nataleah Scioneaux, PT, GCS 03/25/20,10:41 AM

## 2020-03-25 NOTE — Progress Notes (Signed)
  Echocardiogram 2D Echocardiogram with definity has been performed.  Anthony [name] M 03/25/2020, 10:13 AM

## 2020-03-25 NOTE — TOC Transition Note (Signed)
Transition of Care [facility]) - CM/SW Discharge Note   Patient Details  Name: Anthony [name] MRN: [id number] Date of Birth: [birth year]  Transition of Care [facility]) CM/SW Contact:  Trula Ore, [place] Phone Number: 03/25/2020, 1:14 PM   Clinical Narrative:     Patient will DC to: [facility]   Anticipated DC date: 03/25/2020  Family notified: [name]  Transport by: Corey Harold  ?  Per MD patient ready for DC to [facility] . RN, patient, patient's family, and facility notified of DC. Discharge Summary sent to facility. RN given number for report tele# [phone]. DC packet on chart. Ambulance transport requested for patient.  CSW signing off.    Final next level of care: [place] Barriers to Discharge: No Barriers Identified   Patient Goals and CMS Choice Patient states their goals for this hospitalization and ongoing recovery are:: to go to skilled nursing facility CMS Medicare.gov Compare Post Acute Care list provided to:: Patient Represenative (must comment)([name]) Choice offered to / list presented to : Adult Children([name])  Discharge Placement              Patient chooses bed at: [facility] Patient to be transferred to facility by: [place] Name of family member notified: [name] Patient and family notified of of transfer: 03/25/20  Discharge Plan and Services                                     Social Determinants of Health (SDOH) Interventions     Readmission Risk Interventions Readmission Risk Prevention Plan 03/06/2019  Transportation Screening Complete  Medication Review Press photographer) Complete  PCP or Specialist appointment within 3-5 days of discharge Complete  HRI or Home Care Consult Complete  SW Recovery Care/Counseling Consult Complete  [place] Complete  Some recent data might be hidden

## 2020-03-25 NOTE — Discharge Instructions (Signed)

## 2020-03-25 NOTE — TOC Initial Note (Addendum)
Transition of Care [facility]) - Initial/Assessment Note    Patient Details  Name: Anthony [name] MRN: [id number] Date of Birth: 1934/02/19  Transition of Care [facility]) CM/SW Contact:    Trula Ore, [place] Phone Number: 03/25/2020, 11:31 AM  Clinical Narrative:        CSW spoke with daughter [name] as well as [place] Tax inspector. Both are agreeable to patient going back to [facility] for SNF placement.           Expected Discharge Plan: Skilled Nursing Facility Barriers to Discharge: No Barriers Identified   Patient Goals and CMS Choice Patient states their goals for this hospitalization and ongoing recovery are:: SNF      Expected Discharge Plan and Services Expected Discharge Plan: [place]       Living arrangements for the past 2 months: [place] Expected Discharge Date: 03/25/20                                    Prior Living Arrangements/Services Living arrangements for the past 2 months: [place]   Patient language and need for interpreter reviewed:: Yes Do you feel safe going back to the place where you live?: Yes      Need for Family Participation in Patient Care: Yes (Comment) Care giver support system in place?: Yes (comment)   Criminal Activity/Legal Involvement Pertinent to Current Situation/Hospitalization: No - Comment as needed  Activities of Daily Living      Permission Sought/Granted Permission sought to share information with : Case Manager, Customer service manager, Family Supports       Permission granted to share info w AGENCY: SNF        Emotional Assessment       Orientation: : Oriented to Self Alcohol / Substance Use: Not Applicable Psych Involvement: No (comment)  Admission diagnosis:  Diarrhea of presumed infectious origin [R19.7] Right shoulder pain [M25.511] Atrial fibrillation with RVR ([place]) [I48.91] Abrasion of left elbow, initial encounter  [S50.312A] Contusion of face, initial encounter [S00.83XA] Fall at nursing home, initial encounter [W19.XXXA, Y92.129] Sepsis ([place]) [A41.9] Wedge compression fracture of t11-T12 vertebra, initial encounter for closed fracture ([place]) [S22.080A] Sepsis, due to unspecified organism, unspecified whether acute organ dysfunction present [facility]) [A41.9] Patient Active Problem List   Diagnosis Date Noted  . Hypokalemia 03/23/2020  . S/P hip hemiarthroplasty 04/19/2019  . Pressure injury of skin 03/05/2019  . Suspected COVID-19 virus infection 03/01/2019  . Acute respiratory failure with hypoxia ([place]) 03/01/2019  . Hip fracture ([place]) 03/01/2019  . Fall 03/01/2019  . Displaced fracture of base of neck of left femur, initial encounter for closed fracture ([place])   . Fever   . Nonspecific interstitial pneumonia ([place])   . Febrile illness   . Thrush   . Hypoxia   . Sepsis ([place]) 10/04/2018  . Fatty liver 04/06/2018  . Acute pancreatitis 04/04/2018  . Abdominal pain, right lower quadrant 05/28/2015  . Absolute anemia 05/28/2015  . Aortic atherosclerosis ([place]) 05/28/2015  . Altered blood in stool 05/28/2015  . Chronic systolic heart failure ([place]) 05/28/2015  . Syncope and collapse 05/28/2015  . Arteriosclerosis of coronary artery 05/28/2015  . Cough 05/28/2015  . Crohn's disease of large bowel ([place]) 05/28/2015  . Diarrhea 05/28/2015  . Displacement of cervical intervertebral disc without myelopathy 05/28/2015  . Benign essential HTN 05/28/2015  . Adenopathy 05/28/2015  . Hypercholesterolemia without hypertriglyceridemia 05/28/2015  .  Hypo-osmolality and hyponatremia 05/28/2015  . 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 Healed myocardial infarct 05/28/2015  . Peptic esophagitis 05/28/2015  . 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 Paroxysmal ventricular tachycardia ([place]) 05/28/2015  . Decreased body weight 05/28/2015  . Weakness 11/05/2014  . Nausea with vomiting 11/05/2014  . Generalized  weakness   . Dehydration   . Difficulty walking   . Pulmonary infiltrates 07/16/2014  . Chronic combined systolic and diastolic heart failure ([place]) 05/31/2014  . Essential hypertension 05/31/2014  .  H/O Right inguinal hernia repair 01/15/2012  . Small fat containing right inguinal hernia that has been painful 08/05/2011  . Finger wound, simple, open 10/12/2008  . Benign prostatic hyperplasia without urinary obstruction 04/30/2008  . Synovitis and tenosynovitis 04/30/2008  . HLD (hyperlipidemia) 04/12/2008  . COLONIC POLYPS 11/21/2007  . MI 11/21/2007  . Coronary atherosclerosis 11/21/2007  . GERD 11/21/2007  . Rheumatoid arthritis ([place]) 11/21/2007  . SLEEP APNEA, MILD 11/21/2007   PCP:  System, Pcp Not In Pharmacy:  No Pharmacies Listed    Social Determinants of Health (SDOH) Interventions    Readmission Risk Interventions Readmission Risk Prevention Plan 03/06/2019  Transportation Screening Complete  Medication Review Press photographer) Complete  PCP or Specialist appointment within 3-5 days of discharge Complete  HRI or Home Care Consult Complete  SW Recovery Care/Counseling Consult Complete  Palliative Care Screening Not [place] Complete  Some recent data might be hidden

## 2020-03-25 NOTE — Discharge Summary (Signed)
Physician Discharge Summary [facility]    Patient: Anthony [name]                   Admit date: 03/23/2020   DOB: 04-19-1934             Discharge date:03/25/2020/11:20 AM CMK:[id number]                          PCP: System, Pcp Not In  Disposition: SNF  Recommendations for Outpatient Follow-up:   . Follow up: in 2 weeks  Discharge Condition: Stable   Code Status:   Code Status: DNR  Diet recommendation: Cardiac diet   Discharge Diagnoses:    Principal Problem:   Sepsis ([place]) Active Problems:   GERD   Rheumatoid arthritis ([place])   HLD (hyperlipidemia)   Essential hypertension   Chronic systolic heart failure ([facility])   Crohn's disease of large bowel ([place])   Diarrhea   Hypokalemia   History of Present Illness/ Hospital Course Kathleen Argue Summary:   Per HPI: Anthony [name] a 86 y.o.malewith medical history significant ofhypertension, hyperlipidemia, coronary artery disease, Crohn's disease,andGERDwho presents after having a unwitnessed fall in the bathroom. Patient has poor short-term memory at baseline, but is able to recognize family. History is obtained from review of records and the patient's daughter who is present at bedside. Apparently there had been a GI bug going around the facility. Started last on yesterday reported that she requested for him to have laxatives as she thought he was possibly constipated due to abdominal distention and complaints of abdominal pain. Currently patient denies any reports of any pain. At some point patient had had a large bowel movement as stool was reported all of the groundwhen EMS arrived today. Daughter noted that he sustained significance skin tear to his left elbow and had bruising along the left side of his face. He had not been on any antibiotics recently, but had been started on IV Lasix on 4/27 for lower extremity swelling in addition to his Lasix 40 mg p.o. that he normally takes daily.  5/2: Patient  appears to have improvement in sepsis physiology, but needs ongoing IV fluid.  He did have loose bowel movement overnight.  He does not appear to have C. difficile and given the history of likely viral gastroenteritis at his facility, I will discontinue current IV antibiotics and monitor closely.  GI pathogen panel pending.  PT evaluation obtained and pending given recent fall.  Continue on IV fluid for treatment of AKI.  He will also need ongoing wound care to his left arm.  Patient is from [facility].  5/ 3: Patient sinus symptoms is improved, sepsis physiology has improved, no signs of bacterial infection.  Negative for any diarrhea or excessive bowel movements overnight.  C. difficile negative, pending GI pathogen, do not believe the patient has any infectious transmittable gastroenteritis.  Patient evaluated with PT continue SNF.,  Fall precautions Status post IV fluid resuscitation, AKI has improved. Continue need for left arm wound care needs.   Detailed discharge summary :   Sepsissecondary to suspected GI pathogen: Patient presented after having a fall. He was found to be febrile up to 103 F with tachycardia and tachypnea. Labs significant for WBC elevated at 14.8 and lactic acid up to3.4.daughter reported GI bug going around the facility where he lives. C. difficile screening was negative. Patient had been initially started on empiric antibiotics of vancomycin, metronidazole, and  cefepime. CT imaging of the abdomen and pelvis noted concern for possible diarrheal illness. -Follow-up blood culture with no growth noted in last 24 hours -Follow-up GI panel is currently pending--- no major concern for transmittable or infectious GI pathogen at this time. -Discontinue empiric antibiotics given likely source being viral; with outbreak noted in facility -Tylenol as needed for fever. -Lactic acidosis has down trended,    Acute kidney injury: -Resolved  Patient baseline  creatinine previously noted to be around 0.8, patient presents with creatinine elevated up to 1.2 with BUN 18.  Status post IV fluid resuscitation  -Creatinine is currently 1.17>>> 1.00    Acute hypoxemic respiratory failure -was on 3 L nasal cannula oxygen --weaned off to room air -No findings on chest x-ray noted -Continue incentive spirometry  Left arm abrasion status post fall -Ongoing wound care  Oral thrush -Start Nystatin 5/2  Episode of non-sustained Vtach -Soft blood pressures and therefore will hold on B-blocker -Supplement Mg and K for goal of 2 and 4 respectively. -2D echocardiogram ordered; prior in 2015--reviewed within normal limits  Essential hypertension-currently with soft blood pressure readings -Hold current blood pressure medications  Chronic systolic congestive heart failure -Strict intake and output and daily weights -Appears dry and will need ongoing IV fluid for now -2D echo as noted above  Crohn's disease: Imaging studies did not show signs of an active flare of patient's Crohn's disease -Continue mesalamine  Rheumatoid arthritis, chronic steroid use -Continue prednisone   History of diabetes mellitus type 2-with mild hyperglycemia -Likely, somewhat steroid-induced well on prednisone -Start on SSI and monitor  Dementia, without behavioral disturbance -Continue Aricept  Hyperlipidemia -Continue statin  Recent fall, debility: At baseline patienthelps some with transfers. No acute fractures noted on imaging studies.  -X-rays with no acute fractures -CT demonstrates age-indeterminate compression fracture of T12, but with no acute pain and therefore no need for kyphoplasty/vertebroplasty  GERD -Continue omeprazole    Code Status: DNR Family Communication: Discussed with daughter Disposition Plan: SNF   Nutritional status:          Discharge Instructions:   Discharge Instructions    Activity as tolerated - No  restrictions   Complete by: As directed    Call MD for:  difficulty breathing, headache or visual disturbances   Complete by: As directed    Call MD for:  persistant nausea and vomiting   Complete by: As directed    Call MD for:  severe uncontrolled pain   Complete by: As directed    Call MD for:  temperature >100.4   Complete by: As directed    Diet - low sodium heart healthy   Complete by: As directed    Discharge instructions   Complete by: As directed    Follow-up with your PCP   Increase activity slowly   Complete by: As directed        Medication List    TAKE these medications   acetaminophen 325 MG tablet Commonly known as: TYLENOL Take 650 mg by mouth See admin instructions. Take 2 tablets (650 mg) by mouth three times daily for pain management, may also take 2 tablets (650 mg) every 6 hours as needed for mild pain/fever   ascorbic acid 500 MG tablet Commonly known as: VITAMIN C Take 1,000 mg by mouth daily. COVID prophylaxis   aspirin 81 MG chewable tablet Chew 1 tablet (81 mg total) by mouth 2 (two) times daily after a meal.   b complex vitamins tablet Take  1 tablet by mouth daily.   B-12 500 MCG Tabs Take 500 mcg by mouth every evening.   carvedilol 6.25 MG tablet Commonly known as: COREG TAKE 1 TABLET TWICE A DAY WITH A MEAL. What changed:   how much to take  how to take this  when to take this  additional instructions   docusate sodium 100 MG capsule Commonly known as: COLACE Take 1 capsule (100 mg total) by mouth 2 (two) times daily.   donepezil 5 MG tablet Commonly known as: ARICEPT Take 5 mg by mouth at bedtime.   feeding supplement (ENSURE ENLIVE) Liqd Take 237 mLs by mouth 2 (two) times daily between meals.   FLUoxetine 10 MG capsule Commonly known as: PROZAC Take 10 mg by mouth daily.   furosemide 40 MG tablet Commonly known as: LASIX Take 1 tablet (40 mg total) by mouth daily. What changed: Another medication with the same  name was removed. Continue taking this medication, and follow the directions you see here.   guaiFENesin-dextromethorphan 100-10 MG/5ML syrup Commonly known as: ROBITUSSIN DM Take 5 mLs by mouth every 4 (four) hours as needed for cough.   HYDROcodone-acetaminophen 5-325 MG tablet Commonly known as: NORCO/VICODIN Take 1-2 tablets by mouth every 4 (four) hours as needed for moderate pain (pain score 4-6). What changed: reasons to take this   ipratropium-albuterol 0.5-2.5 (3) MG/3ML Soln Commonly known as: DUONEB Take 3 mLs by nebulization every 6 (six) hours as needed (shortness of breath/wheezing).   ivermectin 3 MG Tabs tablet Commonly known as: STROMECTOL Take 3 mg by mouth every Friday. COVID prophylaxis   ketoconazole 2 % cream Commonly known as: NIZORAL Apply 1 application topically 2 (two) times daily. Apply to face - 4 week course ordered 03/21/2020   LORazepam 0.5 MG tablet Commonly known as: ATIVAN Take 0.5 mg by mouth See admin instructions. Take one tablet (0.5 mg) by mouth twice daily - 4pm and bedtime   Magnesium 200 MG Tabs Take 200 mg by mouth daily.   mesalamine 1.2 g EC tablet Commonly known as: LIALDA Take 2.4 g by mouth 2 (two) times daily.   nitroGLYCERIN 0.4 MG SL tablet Commonly known as: NITROSTAT Place 0.4 mg under the tongue every 5 (five) minutes as needed for chest pain.   omeprazole 40 MG capsule Commonly known as: PRILOSEC Take 40 mg by mouth at bedtime.   oxybutynin 5 MG tablet Commonly known as: DITROPAN Take 5 mg by mouth every 12 (twelve) hours.   pravastatin 40 MG tablet Commonly known as: PRAVACHOL Take 1 tablet (40 mg total) by mouth at bedtime.   predniSONE 10 MG tablet Commonly known as: DELTASONE Take 10 mg by mouth daily.   PreviDent 5000 Plus 1.1 % Crea dental cream Generic drug: sodium fluoride Place 1 application onto teeth See admin instructions. Daily at bedtime: Brush on teeth with a toothbrush after PM mouth care.  Spit out excess and do NOT rinse   primidone 50 MG tablet Commonly known as: MYSOLINE Take 50 mg by mouth at bedtime.   tamsulosin 0.4 MG Caps capsule Commonly known as: FLOMAX Take 0.4 mg by mouth at bedtime.   vitamin A 10000 UNIT capsule Take 10,000 Units by mouth daily.   Vitamin D3 50 MCG (2000 UT) capsule Take 2,000 Units by mouth daily.   vitamin E 180 MG (400 UNITS) capsule Take 400 Units by mouth daily.   zinc sulfate 220 (50 Zn) MG capsule Take 220 mg by mouth daily.  No Known Allergies   Procedures /Studies:   DG Elbow Complete Left  Result Date: 03/23/2020 CLINICAL DATA:  Unwitnessed fall in bathroom EXAM: LEFT ELBOW - COMPLETE 3+ VIEW COMPARISON:  None FINDINGS: Osseous demineralization. Olecranon spur present. Radiocapitellar joint space narrowing with spurring at radial head. Question small elbow joint effusion. No acute fracture, dislocation, or bone destruction. IMPRESSION: Radiocapitellar degenerative changes. Question small joint effusion. No definite acute fracture or dislocation identified. Electronically Signed   By: Lavonia Dana M.D.   On: 03/23/2020 13:16   CT Head Wo Contrast  Result Date: 03/23/2020 CLINICAL DATA:  Head trauma, unwitnessed fall EXAM: CT HEAD WITHOUT CONTRAST CT MAXILLOFACIAL WITHOUT CONTRAST CT CERVICAL SPINE WITHOUT CONTRAST TECHNIQUE: Multidetector CT imaging of the head, cervical spine, and maxillofacial structures were performed using the standard protocol without intravenous contrast. Multiplanar CT image reconstructions of the cervical spine and maxillofacial structures were also generated. COMPARISON:  11/05/2014, thyroid ultrasound, 06/08/2011 FINDINGS: CT HEAD FINDINGS Brain: No evidence of acute infarction, hemorrhage, hydrocephalus, extra-axial collection or mass lesion/mass effect. Extensive periventricular and deep white matter hypodensity. Prominence of the lateral ventricles, likely due to global volume loss. Vascular: No  hyperdense vessel or unexpected calcification. CT FACIAL BONES FINDINGS Skull: Normal. Negative for fracture or focal lesion. Facial bones: No displaced fractures or dislocations. Sinuses/Orbits: No acute finding. Other: None. CT CERVICAL SPINE FINDINGS Alignment: Normal. Skull base and vertebrae: No acute fracture. No primary bone lesion or focal pathologic process. Soft tissues and spinal canal: No prevertebral fluid or swelling. No visible canal hematoma. Disc levels: Moderate to severe multilevel disc degenerative and osteophytosis of the cervical spine, with multilevel partial ankylosis. Upper chest: Negative. Other: Enlarged, heterogeneous thyroid, particularly the left lobe, in keeping with appearance on ultrasound dated 06/08/2011 at which time no discrete nodule was identified. IMPRESSION: 1.  No acute intracranial pathology. 2. Extensive periventricular and deep white matter hypodensity with prominence of the lateral ventricles, likely due to advanced small-vessel white matter disease and global volume loss. Prominence of the lateral ventricles is however significantly increased compared to prior examination dated 11/05/2014 and normal pressure hydrocephalus is not strictly excluded. Correlate with referable clinical symptoms, if present. 3.  No displaced fracture or dislocation of the facial bones. 4.  No fracture or static subluxation of the cervical spine. Electronically Signed   By: Eddie Candle M.D.   On: 03/23/2020 13:32   CT Cervical Spine Wo Contrast  Result Date: 03/23/2020 CLINICAL DATA:  Head trauma, unwitnessed fall EXAM: CT HEAD WITHOUT CONTRAST CT MAXILLOFACIAL WITHOUT CONTRAST CT CERVICAL SPINE WITHOUT CONTRAST TECHNIQUE: Multidetector CT imaging of the head, cervical spine, and maxillofacial structures were performed using the standard protocol without intravenous contrast. Multiplanar CT image reconstructions of the cervical spine and maxillofacial structures were also generated.  COMPARISON:  11/05/2014, thyroid ultrasound, 06/08/2011 FINDINGS: CT HEAD FINDINGS Brain: No evidence of acute infarction, hemorrhage, hydrocephalus, extra-axial collection or mass lesion/mass effect. Extensive periventricular and deep white matter hypodensity. Prominence of the lateral ventricles, likely due to global volume loss. Vascular: No hyperdense vessel or unexpected calcification. CT FACIAL BONES FINDINGS Skull: Normal. Negative for fracture or focal lesion. Facial bones: No displaced fractures or dislocations. Sinuses/Orbits: No acute finding. Other: None. CT CERVICAL SPINE FINDINGS Alignment: Normal. Skull base and vertebrae: No acute fracture. No primary bone lesion or focal pathologic process. Soft tissues and spinal canal: No prevertebral fluid or swelling. No visible canal hematoma. Disc levels: Moderate to severe multilevel disc degenerative and osteophytosis of the cervical  spine, with multilevel partial ankylosis. Upper chest: Negative. Other: Enlarged, heterogeneous thyroid, particularly the left lobe, in keeping with appearance on ultrasound dated 06/08/2011 at which time no discrete nodule was identified. IMPRESSION: 1.  No acute intracranial pathology. 2. Extensive periventricular and deep white matter hypodensity with prominence of the lateral ventricles, likely due to advanced small-vessel white matter disease and global volume loss. Prominence of the lateral ventricles is however significantly increased compared to prior examination dated 11/05/2014 and normal pressure hydrocephalus is not strictly excluded. Correlate with referable clinical symptoms, if present. 3.  No displaced fracture or dislocation of the facial bones. 4.  No fracture or static subluxation of the cervical spine. Electronically Signed   By: Eddie Candle M.D.   On: 03/23/2020 13:32   CT ABDOMEN PELVIS W CONTRAST  Result Date: 03/23/2020 CLINICAL DATA:  Abdominal pain, unwitnessed fall EXAM: CT ABDOMEN AND PELVIS WITH  CONTRAST TECHNIQUE: Multidetector CT imaging of the abdomen and pelvis was performed using the standard protocol following bolus administration of intravenous contrast. CONTRAST:  177m OMNIPAQUE IOHEXOL 300 MG/ML  SOLN COMPARISON:  04/04/2018 FINDINGS: Lower chest: No acute abnormality. Dependent bibasilar scarring and or atelectasis. Three-vessel coronary artery calcifications. Hepatobiliary: No solid liver abnormality is seen. No gallstones, gallbladder wall thickening, or biliary dilatation. Pancreas: Unremarkable. No pancreatic ductal dilatation or surrounding inflammatory changes. Spleen: Normal in size without significant abnormality. Adrenals/Urinary Tract: Adrenal glands are unremarkable. Kidneys are normal, without renal calculi, solid lesion, or hydronephrosis. Bladder is unremarkable. Stomach/Bowel: Stomach is within normal limits. Appendix appears normal. No evidence of bowel wall thickening, distention, or inflammatory changes. Colon is fluid-filled to the rectum. Vascular/Lymphatic: Aortic atherosclerosis. No enlarged abdominal or pelvic lymph nodes. Reproductive: No mass or other significant abnormality. Other: Fat containing bilateral inguinal hernias. No abdominopelvic ascites. Musculoskeletal: There is a new, although age-indeterminate wedge deformity of the T12 vertebral body with approximately 25% anterior height loss. IMPRESSION: 1. No evidence of acute traumatic injury to the organs of the abdomen or pelvis. 2. There is a new, although age-indeterminate wedge deformity of the T12 vertebral body with approximately 25% anterior height loss. 3. The colon is fluid-filled to the rectum, in keeping with diarrheal illness. 4.  Coronary artery disease.  Aortic Atherosclerosis (ICD10-I70.0). Electronically Signed   By: AEddie CandleM.D.   On: 03/23/2020 13:37   DG Chest Port 1 View  Result Date: 03/23/2020 CLINICAL DATA:  Sepsis, unwitnessed fall in bathroom, dementia, single episode of vomiting  after fall EXAM: PORTABLE CHEST 1 VIEW COMPARISON:  Portable exam 1151 hours compared to 03/01/2019 FINDINGS: Normal heart size, mediastinal contours, and pulmonary vascularity. Low lung volumes with bibasilar atelectasis. Lungs otherwise clear. No infiltrate, pleural effusion, or pneumothorax. Bones demineralized. IMPRESSION: Low lung volumes with bibasilar atelectasis. Electronically Signed   By: MLavonia DanaM.D.   On: 03/23/2020 13:09   DG Shoulder Right Port  Result Date: 03/23/2020 CLINICAL DATA:  Shoulder pain EXAM: PORTABLE RIGHT SHOULDER COMPARISON:  None. FINDINGS: There are moderate degenerative changes of the right glenohumeral joint without evidence for an acute displaced fracture or dislocation. There is atelectasis versus scarring in the right lung base. No evidence for an acute displaced right-sided rib fracture involving the visualized right-sided ribs. IMPRESSION: 1. No evidence for an acute displaced fracture or dislocation. 2. Moderate right glenohumeral degenerative changes. Electronically Signed   By: CConstance HolsterM.D.   On: 03/23/2020 19:26   CT Maxillofacial Wo Contrast  Result Date: 03/23/2020 CLINICAL DATA:  Head trauma,  unwitnessed fall EXAM: CT HEAD WITHOUT CONTRAST CT MAXILLOFACIAL WITHOUT CONTRAST CT CERVICAL SPINE WITHOUT CONTRAST TECHNIQUE: Multidetector CT imaging of the head, cervical spine, and maxillofacial structures were performed using the standard protocol without intravenous contrast. Multiplanar CT image reconstructions of the cervical spine and maxillofacial structures were also generated. COMPARISON:  11/05/2014, thyroid ultrasound, 06/08/2011 FINDINGS: CT HEAD FINDINGS Brain: No evidence of acute infarction, hemorrhage, hydrocephalus, extra-axial collection or mass lesion/mass effect. Extensive periventricular and deep white matter hypodensity. Prominence of the lateral ventricles, likely due to global volume loss. Vascular: No hyperdense vessel or unexpected  calcification. CT FACIAL BONES FINDINGS Skull: Normal. Negative for fracture or focal lesion. Facial bones: No displaced fractures or dislocations. Sinuses/Orbits: No acute finding. Other: None. CT CERVICAL SPINE FINDINGS Alignment: Normal. Skull base and vertebrae: No acute fracture. No primary bone lesion or focal pathologic process. Soft tissues and spinal canal: No prevertebral fluid or swelling. No visible canal hematoma. Disc levels: Moderate to severe multilevel disc degenerative and osteophytosis of the cervical spine, with multilevel partial ankylosis. Upper chest: Negative. Other: Enlarged, heterogeneous thyroid, particularly the left lobe, in keeping with appearance on ultrasound dated 06/08/2011 at which time no discrete nodule was identified. IMPRESSION: 1.  No acute intracranial pathology. 2. Extensive periventricular and deep white matter hypodensity with prominence of the lateral ventricles, likely due to advanced small-vessel white matter disease and global volume loss. Prominence of the lateral ventricles is however significantly increased compared to prior examination dated 11/05/2014 and normal pressure hydrocephalus is not strictly excluded. Correlate with referable clinical symptoms, if present. 3.  No displaced fracture or dislocation of the facial bones. 4.  No fracture or static subluxation of the cervical spine. Electronically Signed   By: Eddie Candle M.D.   On: 03/23/2020 13:32    Subjective:   Patient was seen and examined 03/25/2020, 11:20 AM Patient stable today. No acute distress.  No issues overnight Stable for discharge.  Discharge Exam:    Vitals:   03/24/20 2101 03/25/20 0425 03/25/20 0900 03/25/20 1031  BP: (!) 111/91 115/64 121/72   Pulse: 85 76 85   Resp: 18 17 14    Temp: 98.2 F (36.8 C) 98 F (36.7 C)    TempSrc: Oral Oral    SpO2: 92% 95% 93% 93%  Weight:  92.5 kg    Height:        General: Pt lying comfortably in bed & appears in no obvious  distress. Cardiovascular: S1 & S2 heard, RRR, S1/S2 +. No murmurs, rubs, gallops or clicks. No JVD or pedal edema. Respiratory: Clear to auscultation without wheezing, rhonchi or crackles. No increased work of breathing. Abdominal:  Non-distended, non-tender & soft. No organomegaly or masses appreciated. Normal bowel sounds heard. CNS: Alert and oriented. No focal deficits. Extremities: no edema, no cyanosis    The results of significant diagnostics from this hospitalization (including imaging, microbiology, ancillary and laboratory) are listed below for reference.      Microbiology:   Recent Results (from the past 240 hour(s))  Blood Culture (routine x 2)     Status: None (Preliminary result)   Collection Time: 03/23/20 10:22 AM   Specimen: BLOOD RIGHT FOREARM  Result Value Ref Range Status   Specimen Description BLOOD RIGHT FOREARM  Final   Special Requests   Final    BOTTLES DRAWN AEROBIC AND ANAEROBIC Blood Culture adequate volume   Culture   Final    NO GROWTH 2 DAYS Performed at [facility], [address], [place], [place] [address]    Report Status PENDING  Incomplete  Blood Culture (routine x 2)     Status: None (Preliminary result)   Collection Time: 03/23/20 10:48 AM   Specimen: BLOOD RIGHT HAND  Result Value Ref Range Status   Specimen Description BLOOD RIGHT HAND  Final   Special Requests   Final    BOTTLES DRAWN AEROBIC AND ANAEROBIC Blood Culture results may not be optimal due to an inadequate volume of blood received in culture bottles   Culture   Final    NO GROWTH 2 DAYS Performed at [facility], [place] [address] [place], [place] [address]    Report Status PENDING  Incomplete  C Difficile Quick Screen w PCR reflex     Status: None   Collection Time: 03/23/20 11:28 AM   Specimen: STOOL  Result Value Ref Range Status   C Diff antigen NEGATIVE NEGATIVE Final   C Diff toxin NEGATIVE NEGATIVE Final   C Diff interpretation No C. difficile  detected.  Final    Comment: Performed at [facility], [place] [address] [place], [place] [address]  Respiratory Panel by RT PCR (Flu A&B, Covid) - Nasopharyngeal Swab     Status: None   Collection Time: 03/23/20  1:28 PM   Specimen: Nasopharyngeal Swab  Result Value Ref Range Status   SARS Coronavirus 2 by RT PCR NEGATIVE NEGATIVE Final    Comment: (NOTE) SARS-CoV-2 target nucleic acids are NOT DETECTED. The SARS-CoV-2 RNA is generally detectable in upper respiratoy specimens during the acute phase of infection. The lowest concentration of SARS-CoV-2 viral copies this assay can detect is 131 copies/mL. A negative result does not preclude SARS-Cov-2 infection and should not be used as the sole basis for treatment or other patient management decisions. A negative result may occur with  improper specimen collection/handling, submission of specimen other than nasopharyngeal swab, presence of viral mutation(s) within the areas targeted by this assay, and inadequate number of viral copies (<131 copies/mL). A negative result must be combined with clinical observations, patient history, and epidemiological information. The expected result is Negative. Fact Sheet for Patients:  PinkCheek.be Fact Sheet for Healthcare Providers:  GravelBags.it This test is not yet ap proved or cleared by the [place] FDA and  has been authorized for detection and/or diagnosis of SARS-CoV-2 by FDA under an Emergency Use Authorization (EUA). This EUA will remain  in effect (meaning this test can be used) for the duration of the COVID-19 declaration under Section 564(b)(1) of the Act, 21 U.S.C. section 360bbb-3(b)(1), unless the authorization is terminated or revoked sooner.    Influenza A by PCR NEGATIVE NEGATIVE Final   Influenza B by PCR NEGATIVE NEGATIVE Final    Comment: (NOTE) The Xpert Xpress SARS-CoV-2/FLU/RSV assay is intended as an  aid in  the diagnosis of influenza from Nasopharyngeal swab specimens and  should not be used as a sole basis for treatment. Nasal washings and  aspirates are unacceptable for Xpert Xpress SARS-CoV-2/FLU/RSV  testing. Fact Sheet for Patients: PinkCheek.be Fact Sheet for Healthcare Providers: GravelBags.it This test is not yet approved or cleared by the [place] FDA and  has been authorized for detection and/or diagnosis of SARS-CoV-2 by  FDA under an Emergency Use Authorization (EUA). This EUA will remain  in effect (meaning this test can be used) for the duration of the  Covid-19 declaration under Section 564(b)(1) of the Act, 21  U.S.C. section 360bbb-3(b)(1), unless the authorization  is  terminated or revoked. Performed at [facility], [place] [address], [place], [place] [address]   Urine culture     Status: None   Collection Time: 03/23/20  3:07 PM   Specimen: In/Out Cath Urine  Result Value Ref Range Status   Specimen Description IN/OUT CATH URINE  Final   Special Requests NONE  Final   Culture   Final    NO GROWTH Performed at [facility], [place] [address], [place], [place] [address]    Report Status 03/24/2020 FINAL  Final     Labs:   CBC: Recent Labs  Lab 03/23/20 1022 03/24/20 0525 03/25/20 0446  WBC 14.8* 11.5* 8.3  NEUTROABS 13.4*  --   --   HGB 17.2* 13.5 12.2*  HCT 52.3* 41.4 37.8*  MCV 90.8 90.2 93.3  PLT 231 178 606   Basic Metabolic Panel: Recent Labs  Lab 03/23/20 1022 03/24/20 0525 03/25/20 0446  NA 138 138 136  K 3.2* 3.7 3.8  CL 97* 109 107  CO2 24 22 21*  GLUCOSE 153* 176* 111*  BUN 18 17 11   CREATININE 1.20 1.17 1.00  CALCIUM 8.8* 7.7* 7.9*  MG  --  1.7 1.9   Liver Function Tests: Recent Labs  Lab 03/23/20 1022  AST 26  ALT 20  ALKPHOS 64  BILITOT 1.3*  PROT 7.0  ALBUMIN 3.6   BNP (last 3 results) No results for input(s): BNP in the last 8760  hours. Cardiac Enzymes: No results for input(s): CKTOTAL, CKMB, CKMBINDEX, TROPONINI in the last 168 hours. CBG: Recent Labs  Lab 03/24/20 1236 03/24/20 1645 03/24/20 2104 03/25/20 0743 03/25/20 1115  GLUCAP 142* 135* 131* 104* 92   Hgb A1c Recent Labs    03/24/20 0525  HGBA1C 7.0*   Lipid P Urinalysis    Component Value Date/Time   COLORURINE YELLOW 03/23/2020 1507   APPEARANCEUR CLEAR 03/23/2020 1507   LABSPEC >1.046 (H) 03/23/2020 1507   PHURINE 6.0 03/23/2020 1507   GLUCOSEU NEGATIVE 03/23/2020 1507   HGBUR NEGATIVE 03/23/2020 1507   BILIRUBINUR NEGATIVE 03/23/2020 1507   KETONESUR 5 (A) 03/23/2020 1507   PROTEINUR NEGATIVE 03/23/2020 1507   UROBILINOGEN 0.2 11/05/2014 0303   NITRITE NEGATIVE 03/23/2020 1507   LEUKOCYTESUR NEGATIVE 03/23/2020 1507         Time coordinating discharge: Over 45 minutes  SIGNED: Deatra James, MD, FACP, FHM. [facility],  Please use [email or web address] to Page If 7PM-7AM, please contact night-coverage [email or web address], Password [facility] 03/25/2020, 11:20 AM

## 2020-03-25 NOTE — NC FL2 (Signed)
[place] LEVEL OF CARE SCREENING TOOL     IDENTIFICATION  Patient Name: Anthony [name] Birthdate: Mar 28, 1934 Sex: male Admission Date (Current Location): 03/23/2020  [facility] and [place] Number:  Herbalist and Address:  [facility], [place] [address], [place], [place] [address]      Provider Number: [id number]  Attending Physician Name and Address:  Deatra James, MD  Relative Name and Phone Number:  [name] [phone]    Current Level of Care: Hospital Recommended Level of Care: [place] Prior Approval Number:    Date Approved/Denied: 04/06/18 PASRR Number: [id number]  Discharge Plan: SNF    Current Diagnoses: Patient Active Problem List   Diagnosis Date Noted  . Hypokalemia 03/23/2020  . S/P hip hemiarthroplasty 04/19/2019  . Pressure injury of skin 03/05/2019  . Suspected COVID-19 virus infection 03/01/2019  . Acute respiratory failure with hypoxia ([place]) 03/01/2019  . Hip fracture ([place]) 03/01/2019  . Fall 03/01/2019  . Displaced fracture of base of neck of left femur, initial encounter for closed fracture ([place])   . Fever   . Nonspecific interstitial pneumonia ([place])   . Febrile illness   . Thrush   . Hypoxia   . Sepsis ([place]) 10/04/2018  . Fatty liver 04/06/2018  . Acute pancreatitis 04/04/2018  . Abdominal pain, right lower quadrant 05/28/2015  . Absolute anemia 05/28/2015  . Aortic atherosclerosis ([place]) 05/28/2015  . Altered blood in stool 05/28/2015  . Chronic systolic heart failure ([place]) 05/28/2015  . Syncope and collapse 05/28/2015  . Arteriosclerosis of coronary artery 05/28/2015  . Cough 05/28/2015  . Crohn's disease of large bowel ([place]) 05/28/2015  . Diarrhea 05/28/2015  . Displacement of cervical intervertebral disc without myelopathy 05/28/2015  . Benign essential HTN 05/28/2015  . Adenopathy 05/28/2015  . Hypercholesterolemia without hypertriglyceridemia 05/28/2015  .  Hypo-osmolality and hyponatremia 05/28/2015  . Postinflammatory pulmonary fibrosis ([place]) 05/28/2015  . Healed myocardial infarct 05/28/2015  . Peptic esophagitis 05/28/2015  . 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 Paroxysmal ventricular tachycardia ([place]) 05/28/2015  . Decreased body weight 05/28/2015  . Weakness 11/05/2014  . Nausea with vomiting 11/05/2014  . Generalized weakness   . Dehydration   . Difficulty walking   . Pulmonary infiltrates 07/16/2014  . Chronic combined systolic and diastolic heart failure ([place]) 05/31/2014  . Essential hypertension 05/31/2014  .  H/O Right inguinal hernia repair 01/15/2012  . Small fat containing right inguinal hernia that has been painful 08/05/2011  . Finger wound, simple, open 10/12/2008  . Benign prostatic hyperplasia without urinary obstruction 04/30/2008  . Synovitis and tenosynovitis 04/30/2008  . HLD (hyperlipidemia) 04/12/2008  . COLONIC POLYPS 11/21/2007  . MI 11/21/2007  . Coronary atherosclerosis 11/21/2007  . GERD 11/21/2007  . Rheumatoid arthritis ([place]) 11/21/2007  . SLEEP APNEA, MILD 11/21/2007    Orientation RESPIRATION BLADDER Height & Weight     Self  Normal Incontinent, External catheter Weight: 203 lb 14.8 oz (92.5 kg) Height:  5' 10"  (177.8 cm)  BEHAVIORAL SYMPTOMS/MOOD NEUROLOGICAL BOWEL NUTRITION STATUS      Continent Diet(See Discharge Summary)  AMBULATORY STATUS COMMUNICATION OF NEEDS Skin   Extensive Assist Verbally Skin abrasions, Other (Comment)(pale dry,abraision cracking moisture, knee right left lateral anterior heel left mid moisture buttocks sacrum right left mid skin tear elbow left)                       Personal Care Assistance Level of Assistance  Bathing, Feeding,  Dressing Bathing Assistance: Maximum assistance Feeding assistance: Independent(needs assist sitting up) Dressing Assistance: Maximum assistance     Functional Limitations Info  Sight, Hearing, Speech Sight Info: Impaired Hearing Info:  Adequate Speech Info: Adequate    SPECIAL CARE FACTORS FREQUENCY  PT (By licensed PT), OT (By licensed OT)     PT Frequency: 5x min weekly OT Frequency: 5x min weekly            Contractures Contractures Info: Not present    Additional Factors Info  Code Status Code Status Info: DNR             Current Medications (03/25/2020):  This is the current hospital active medication list Current Facility-Administered Medications  Medication Dose Route Frequency Provider Last Rate Last Admin  . 0.9 %  sodium chloride infusion   Intravenous Continuous Heath Lark D, DO 75 mL/hr at 03/25/20 0416 New Bag at 03/25/20 0416  . acetaminophen (TYLENOL) tablet 650 mg  650 mg Oral Q6H PRN Fuller Plan A, MD   650 mg at 03/24/20 1652   Or  . acetaminophen (TYLENOL) suppository 650 mg  650 mg Rectal Q6H PRN Fuller Plan A, MD      . aspirin chewable tablet 81 mg  81 mg Oral BID PC Smith, Rondell A, MD   81 mg at 03/25/20 1051  . donepezil (ARICEPT) tablet 5 mg  5 mg Oral QHS Smith, Rondell A, MD   5 mg at 03/24/20 2218  . enoxaparin (LOVENOX) injection 40 mg  40 mg Subcutaneous Q24H Smith, Rondell A, MD   40 mg at 03/24/20 1753  . FLUoxetine (PROZAC) capsule 10 mg  10 mg Oral Daily [name], Rondell A, MD   10 mg at 03/25/20 1050  . HYDROcodone-acetaminophen (NORCO/VICODIN) 5-325 MG per tablet 1-2 tablet  1-2 tablet Oral Q4H PRN Smith, Rondell A, MD      . insulin aspart (novoLOG) injection 0-15 Units  0-15 Units Subcutaneous TID WC Shah, Pratik D, DO   2 Units at 03/24/20 1651  . insulin aspart (novoLOG) injection 0-5 Units  0-5 Units Subcutaneous QHS Shah, Pratik D, DO      . ipratropium-albuterol (DUONEB) 0.5-2.5 (3) MG/3ML nebulizer solution 3 mL  3 mL Nebulization Q6H PRN Smith, Rondell A, MD      . ketoconazole (NIZORAL) 2 % cream 1 application  1 application Topical BID Fuller Plan A, MD   1 application at 62/70/35 1057  . LORazepam (ATIVAN) tablet 0.5 mg  0.5 mg Oral 2 times per day  Fuller Plan A, MD   0.5 mg at 03/24/20 2224  . mesalamine (LIALDA) EC tablet 2.4 g  2.4 g Oral BID [name], Rondell A, MD   2.4 g at 03/25/20 1050  . nystatin (MYCOSTATIN) 100000 UNIT/ML suspension 500,000 Units  5 mL Oral QID Manuella Ghazi, Pratik D, DO   500,000 Units at 03/25/20 1052  . ondansetron (ZOFRAN) tablet 4 mg  4 mg Oral Q6H PRN Fuller Plan A, MD       Or  . ondansetron (ZOFRAN) injection 4 mg  4 mg Intravenous Q6H PRN Smith, Rondell A, MD      . oxybutynin (DITROPAN) tablet 5 mg  5 mg Oral Q12H Smith, Rondell A, MD   5 mg at 03/25/20 1051  . pantoprazole (PROTONIX) EC tablet 40 mg  40 mg Oral Daily [name], Rondell A, MD   40 mg at 03/25/20 1052  . perflutren lipid microspheres (DEFINITY) IV suspension  1-10 mL Intravenous PRN  Manuella Ghazi, Pratik D, DO   2 mL at 03/25/20 0956  . pravastatin (PRAVACHOL) tablet 40 mg  40 mg Oral QHS Fuller Plan A, MD   40 mg at 03/24/20 2219  . predniSONE (DELTASONE) tablet 10 mg  10 mg Oral Daily [name], Rondell A, MD   10 mg at 03/25/20 1051  . primidone (MYSOLINE) tablet 50 mg  50 mg Oral QHS Smith, Rondell A, MD   50 mg at 03/24/20 2219  . sodium chloride flush (NS) 0.9 % injection 3 mL  3 mL Intravenous Q12H Smith, Rondell A, MD   3 mL at 03/25/20 1052  . tamsulosin (FLOMAX) capsule 0.4 mg  0.4 mg Oral QHS Fuller Plan A, MD   0.4 mg at 03/24/20 2219     Discharge Medications: Please see discharge summary for a list of discharge medications.  Relevant Imaging Results:  Relevant Lab Results:   Additional Information [id number]  Trula Ore, LCSWA

## 2020-03-26 DIAGNOSIS — R278 Other lack of coordination: Secondary | ICD-10-CM | POA: Diagnosis not present

## 2020-03-26 DIAGNOSIS — F329 Major depressive disorder, single episode, unspecified: Secondary | ICD-10-CM | POA: Diagnosis not present

## 2020-03-26 LAB — GASTROINTESTINAL PANEL BY PCR, STOOL (REPLACES STOOL CULTURE)

## 2020-03-26 NOTE — Progress Notes (Signed)
Charge RN notified by lab that GI Panel was positive for Norovirus. Clint Lipps, SW notified and called facility to make them aware. Clint Lipps, AD also made aware.

## 2020-03-27 DIAGNOSIS — I2510 Atherosclerotic heart disease of native coronary artery without angina pectoris: Secondary | ICD-10-CM | POA: Diagnosis not present

## 2020-03-27 DIAGNOSIS — R197 Diarrhea, unspecified: Secondary | ICD-10-CM | POA: Diagnosis not present

## 2020-03-27 DIAGNOSIS — J9601 Acute respiratory failure with hypoxia: Secondary | ICD-10-CM | POA: Diagnosis not present

## 2020-03-27 DIAGNOSIS — N179 Acute kidney failure, unspecified: Secondary | ICD-10-CM | POA: Diagnosis not present

## 2020-03-28 DIAGNOSIS — E119 Type 2 diabetes mellitus without complications: Secondary | ICD-10-CM | POA: Diagnosis not present

## 2020-03-28 DIAGNOSIS — I5023 Acute on chronic systolic (congestive) heart failure: Secondary | ICD-10-CM | POA: Diagnosis not present

## 2020-03-28 DIAGNOSIS — R609 Edema, unspecified: Secondary | ICD-10-CM | POA: Diagnosis not present

## 2020-03-28 DIAGNOSIS — D649 Anemia, unspecified: Secondary | ICD-10-CM | POA: Diagnosis not present

## 2020-03-28 DIAGNOSIS — J9601 Acute respiratory failure with hypoxia: Secondary | ICD-10-CM | POA: Diagnosis not present

## 2020-03-28 DIAGNOSIS — I110 Hypertensive heart disease with heart failure: Secondary | ICD-10-CM | POA: Diagnosis not present

## 2020-03-28 LAB — CULTURE, BLOOD (ROUTINE X 2)
Culture: NO GROWTH
Culture: NO GROWTH
Special Requests: ADEQUATE

## 2020-04-10 DIAGNOSIS — I5022 Chronic systolic (congestive) heart failure: Secondary | ICD-10-CM | POA: Diagnosis not present

## 2020-04-10 DIAGNOSIS — I110 Hypertensive heart disease with heart failure: Secondary | ICD-10-CM | POA: Diagnosis not present

## 2020-04-10 DIAGNOSIS — K5090 Crohn's disease, unspecified, without complications: Secondary | ICD-10-CM | POA: Diagnosis not present

## 2020-04-10 DIAGNOSIS — I2510 Atherosclerotic heart disease of native coronary artery without angina pectoris: Secondary | ICD-10-CM | POA: Diagnosis not present

## 2020-04-11 DIAGNOSIS — I2510 Atherosclerotic heart disease of native coronary artery without angina pectoris: Secondary | ICD-10-CM | POA: Diagnosis not present

## 2020-04-11 DIAGNOSIS — I110 Hypertensive heart disease with heart failure: Secondary | ICD-10-CM | POA: Diagnosis not present

## 2020-04-11 DIAGNOSIS — I5022 Chronic systolic (congestive) heart failure: Secondary | ICD-10-CM | POA: Diagnosis not present

## 2020-04-11 DIAGNOSIS — F0390 Unspecified dementia without behavioral disturbance: Secondary | ICD-10-CM | POA: Diagnosis not present

## 2020-04-23 DIAGNOSIS — R278 Other lack of coordination: Secondary | ICD-10-CM | POA: Diagnosis not present

## 2020-05-08 DIAGNOSIS — I110 Hypertensive heart disease with heart failure: Secondary | ICD-10-CM | POA: Diagnosis not present

## 2020-05-08 DIAGNOSIS — K5090 Crohn's disease, unspecified, without complications: Secondary | ICD-10-CM | POA: Diagnosis not present

## 2020-05-08 DIAGNOSIS — I2510 Atherosclerotic heart disease of native coronary artery without angina pectoris: Secondary | ICD-10-CM | POA: Diagnosis not present

## 2020-05-08 DIAGNOSIS — I5022 Chronic systolic (congestive) heart failure: Secondary | ICD-10-CM | POA: Diagnosis not present

## 2020-05-09 DIAGNOSIS — I5022 Chronic systolic (congestive) heart failure: Secondary | ICD-10-CM | POA: Diagnosis not present

## 2020-05-09 DIAGNOSIS — I2510 Atherosclerotic heart disease of native coronary artery without angina pectoris: Secondary | ICD-10-CM | POA: Diagnosis not present

## 2020-05-09 DIAGNOSIS — I110 Hypertensive heart disease with heart failure: Secondary | ICD-10-CM | POA: Diagnosis not present

## 2020-05-09 DIAGNOSIS — F0390 Unspecified dementia without behavioral disturbance: Secondary | ICD-10-CM | POA: Diagnosis not present

## 2020-05-10 DIAGNOSIS — Z79899 Other long term (current) drug therapy: Secondary | ICD-10-CM | POA: Diagnosis not present

## 2020-05-10 DIAGNOSIS — E785 Hyperlipidemia, unspecified: Secondary | ICD-10-CM | POA: Diagnosis not present

## 2020-05-10 DIAGNOSIS — D519 Vitamin B12 deficiency anemia, unspecified: Secondary | ICD-10-CM | POA: Diagnosis not present

## 2020-05-10 DIAGNOSIS — D649 Anemia, unspecified: Secondary | ICD-10-CM | POA: Diagnosis not present

## 2020-05-10 DIAGNOSIS — E559 Vitamin D deficiency, unspecified: Secondary | ICD-10-CM | POA: Diagnosis not present

## 2020-05-23 DIAGNOSIS — R278 Other lack of coordination: Secondary | ICD-10-CM | POA: Diagnosis not present

## 2020-06-05 DIAGNOSIS — I5022 Chronic systolic (congestive) heart failure: Secondary | ICD-10-CM | POA: Diagnosis not present

## 2020-06-05 DIAGNOSIS — I2510 Atherosclerotic heart disease of native coronary artery without angina pectoris: Secondary | ICD-10-CM | POA: Diagnosis not present

## 2020-06-05 DIAGNOSIS — K5090 Crohn's disease, unspecified, without complications: Secondary | ICD-10-CM | POA: Diagnosis not present

## 2020-06-05 DIAGNOSIS — I110 Hypertensive heart disease with heart failure: Secondary | ICD-10-CM | POA: Diagnosis not present

## 2020-06-06 DIAGNOSIS — R251 Tremor, unspecified: Secondary | ICD-10-CM | POA: Diagnosis not present

## 2020-06-06 DIAGNOSIS — E039 Hypothyroidism, unspecified: Secondary | ICD-10-CM | POA: Diagnosis not present

## 2020-06-06 DIAGNOSIS — I5022 Chronic systolic (congestive) heart failure: Secondary | ICD-10-CM | POA: Diagnosis not present

## 2020-06-06 DIAGNOSIS — D519 Vitamin B12 deficiency anemia, unspecified: Secondary | ICD-10-CM | POA: Diagnosis not present

## 2020-06-06 DIAGNOSIS — D649 Anemia, unspecified: Secondary | ICD-10-CM | POA: Diagnosis not present

## 2020-06-06 DIAGNOSIS — Z79899 Other long term (current) drug therapy: Secondary | ICD-10-CM | POA: Diagnosis not present

## 2020-06-18 DIAGNOSIS — F419 Anxiety disorder, unspecified: Secondary | ICD-10-CM | POA: Diagnosis not present

## 2020-06-18 DIAGNOSIS — F329 Major depressive disorder, single episode, unspecified: Secondary | ICD-10-CM | POA: Diagnosis not present

## 2020-06-20 IMAGING — CT CT ANGIOGRAPHY CHEST
2 of 8 series · 17 of 46 positions shown · IV contrast (iopamidol)
Comparison: 10/07/2018

CLINICAL DATA: Femur fracture and fever.

EXAM:
CT ANGIOGRAPHY CHEST WITH CONTRAST
TECHNIQUE: Multidetector CT imaging of the chest was performed using the
standard protocol during bolus administration of intravenous
contrast. Multiplanar CT image reconstructions and MIPs were
obtained to evaluate the vascular anatomy.
CONTRAST:  75mL PKMT84-TP9 IOPAMIDOL (PKMT84-TP9) INJECTION 76%

[Series 6: thins · axial · 0.98mm/px · z∈[+1232,+1434]mm · 14 of 225 slices shown]
[im 11/225  lung]
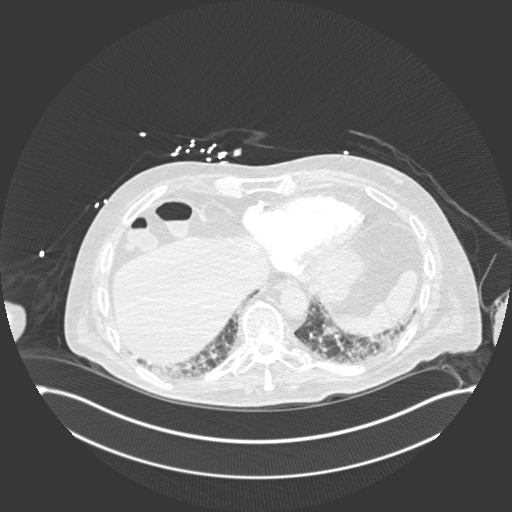
[im 31/225  soft-tissue]
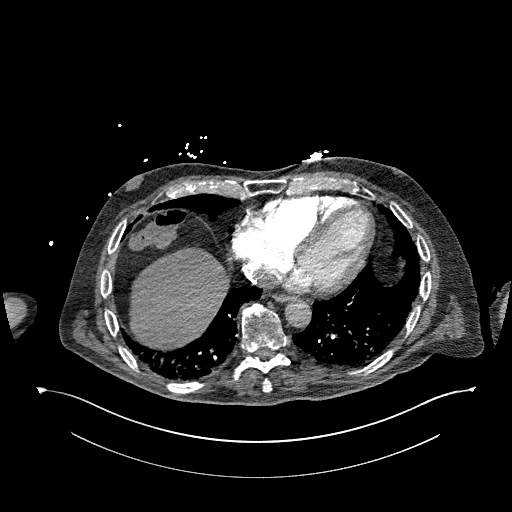
[im 41/225  lung]
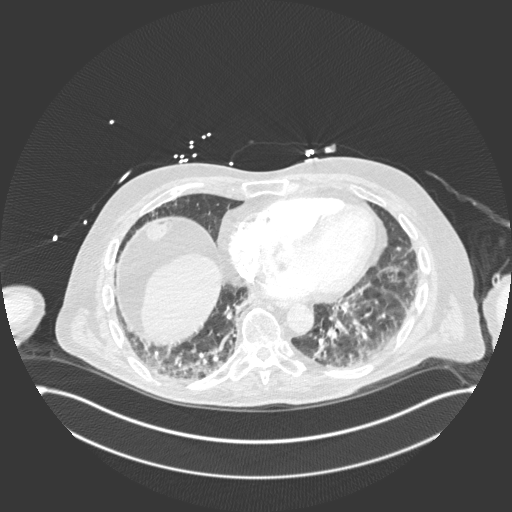
[im 62/225  soft-tissue]
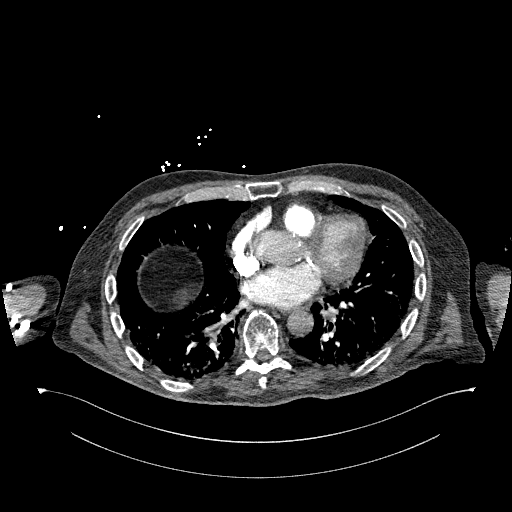
[im 72/225  lung]
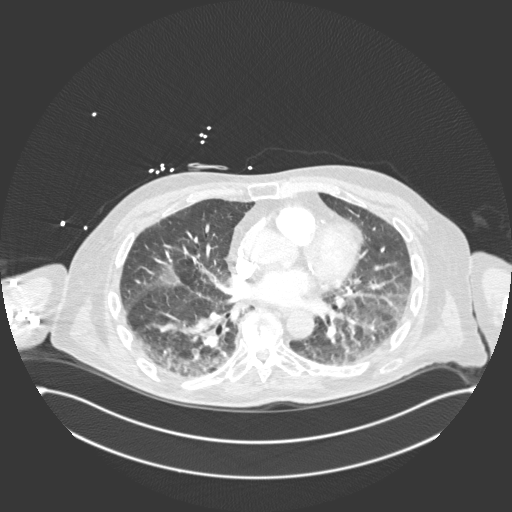
[im 92/225  soft-tissue]
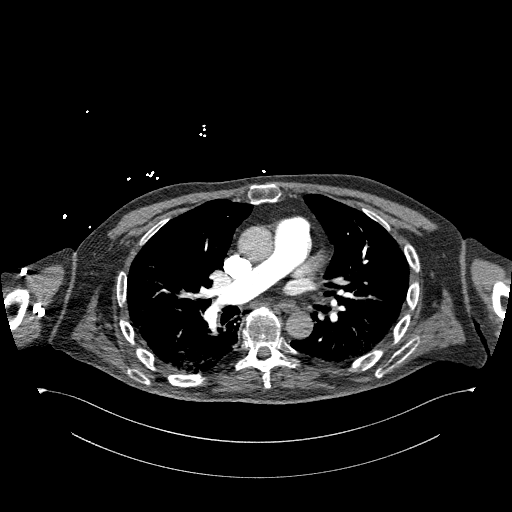
[im 102/225  lung]
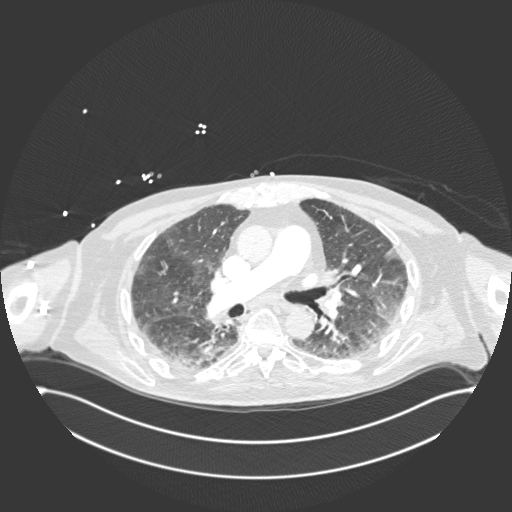
[im 123/225  soft-tissue]
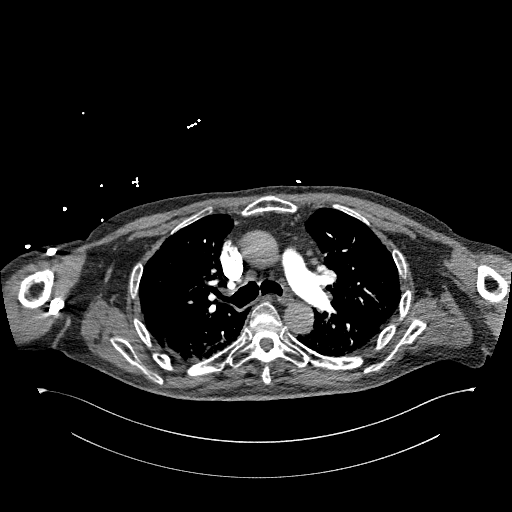
[im 133/225  lung]
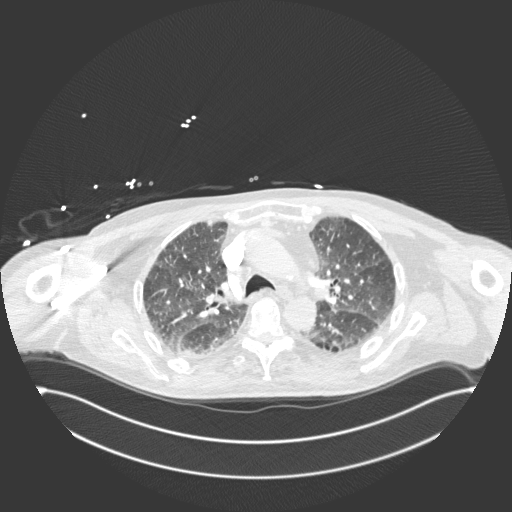
[im 153/225  soft-tissue]
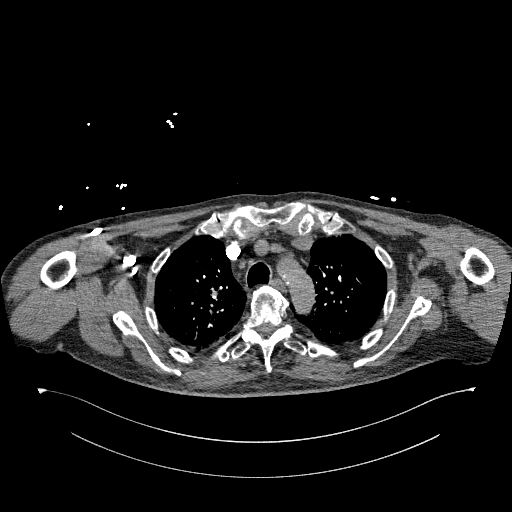
[im 163/225  lung]
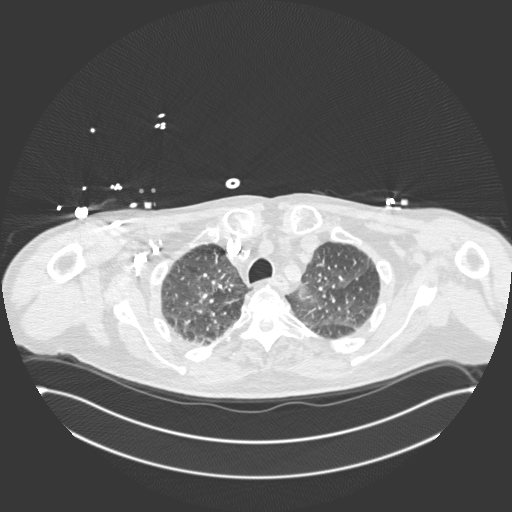
[im 184/225  soft-tissue]
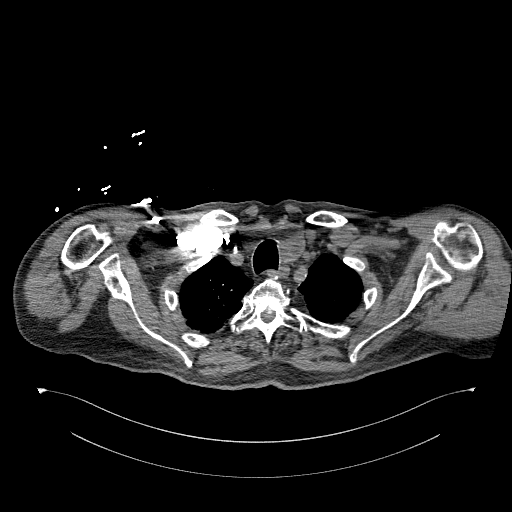
[im 194/225  lung]
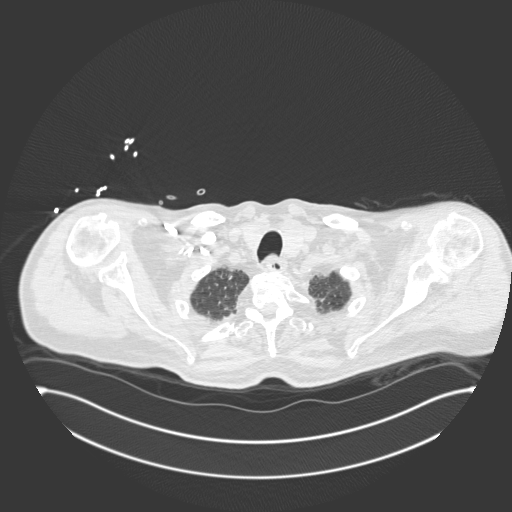
[im 214/225  soft-tissue]
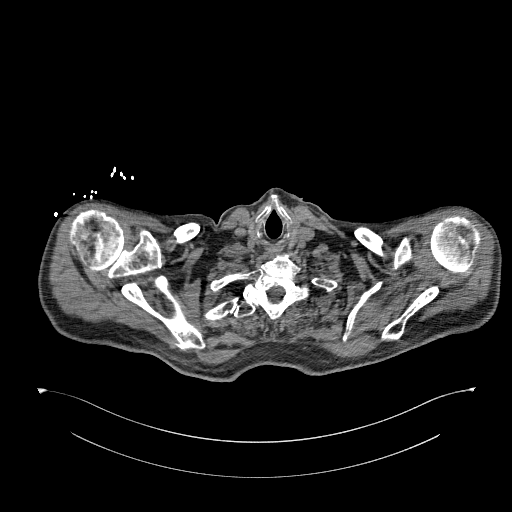

[Series 8: coronal mpr · coronal · 0.45mm/px · 3 of 123 slices shown]
[im 31/123  soft-tissue]
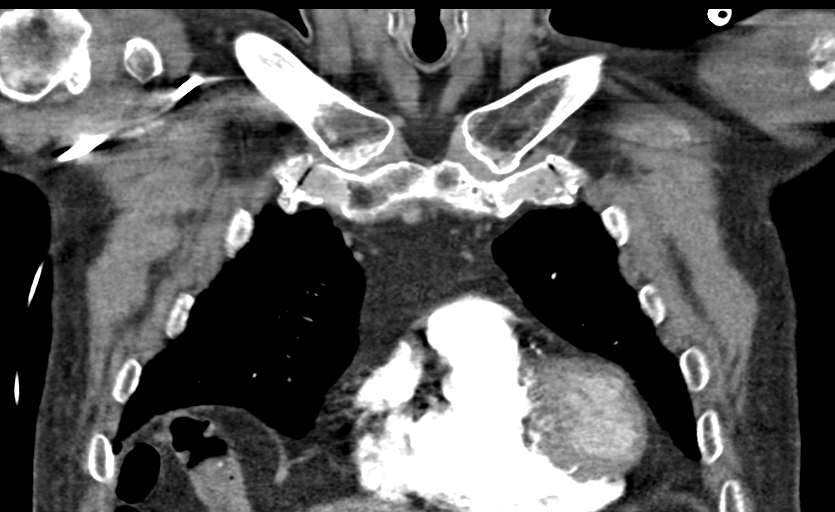
[im 62/123  soft-tissue]
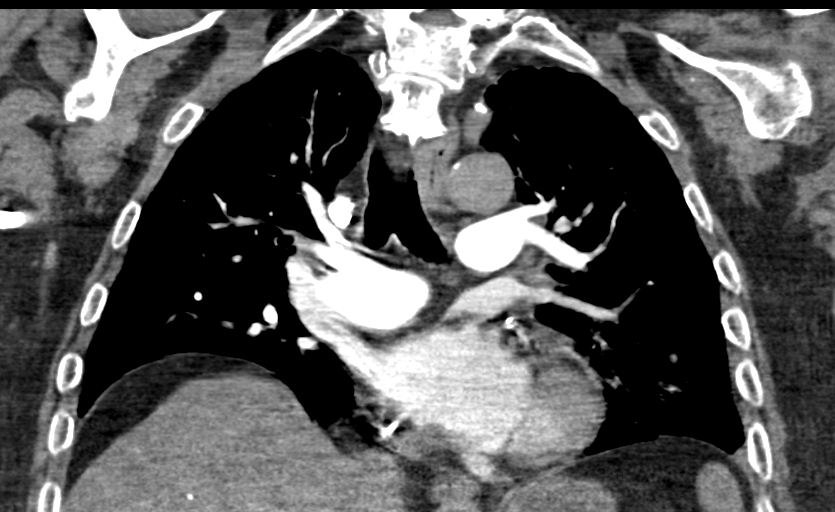
[im 92/123  soft-tissue]
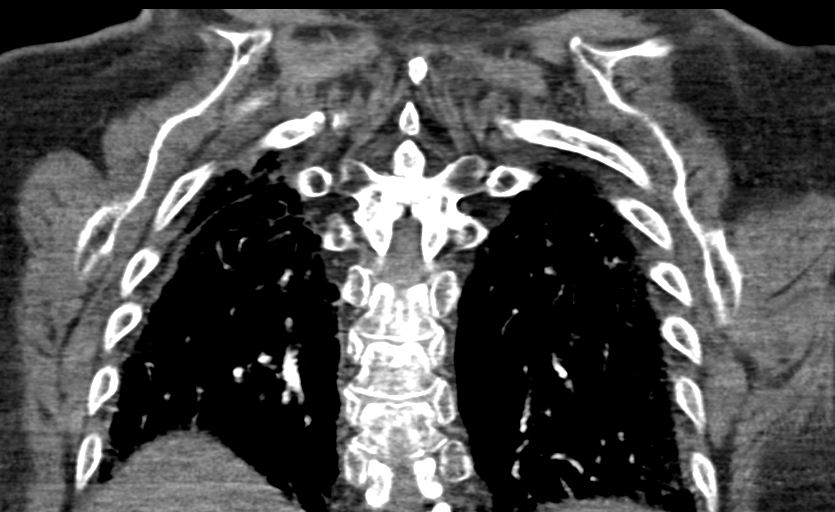

[17 of 46 positions shown; findings below may reference images not displayed]

FINDINGS: Cardiovascular: Satisfactory opacification of the pulmonary arteries
to the segmental level. No evidence of pulmonary embolism. Normal
heart size. No pericardial effusion. Atherosclerotic calcification
of the aorta and coronaries.

Mediastinum/Nodes: Negative for adenopathy. Nodular thyroid gland,
especially on the left, stable.

Lungs/Pleura: Mild increased density of the lungs likely related to
expiratory phase imaging with atelectasis. Small discrete
ground-glass opacity in the subpleural left upper lobe. Central
airways are clear. No edema or effusion.

Upper Abdomen: No acute finding

Musculoskeletal: Remote T5 superior endplate fracture

Review of the MIP images confirms the above findings.
IMPRESSION: 1. Negative for pulmonary embolism.
2. Increased pulmonary density is likely from atelectasis on this
expiratory phase scan. Small ground-glass nodule in the left, cannot
exclude early and infectious or inflammatory process in the setting
of fever.

## 2020-06-20 IMAGING — CR DG HIP (WITH OR WITHOUT PELVIS) 2-3V LEFT
3 series · 3 of 3 positions shown · non-contrast
Comparison: None.

CLINICAL DATA: Initial evaluation for acute trauma, fall.

EXAM:
DG HIP (WITH OR WITHOUT PELVIS) 2-3V LEFT

[hip ap]
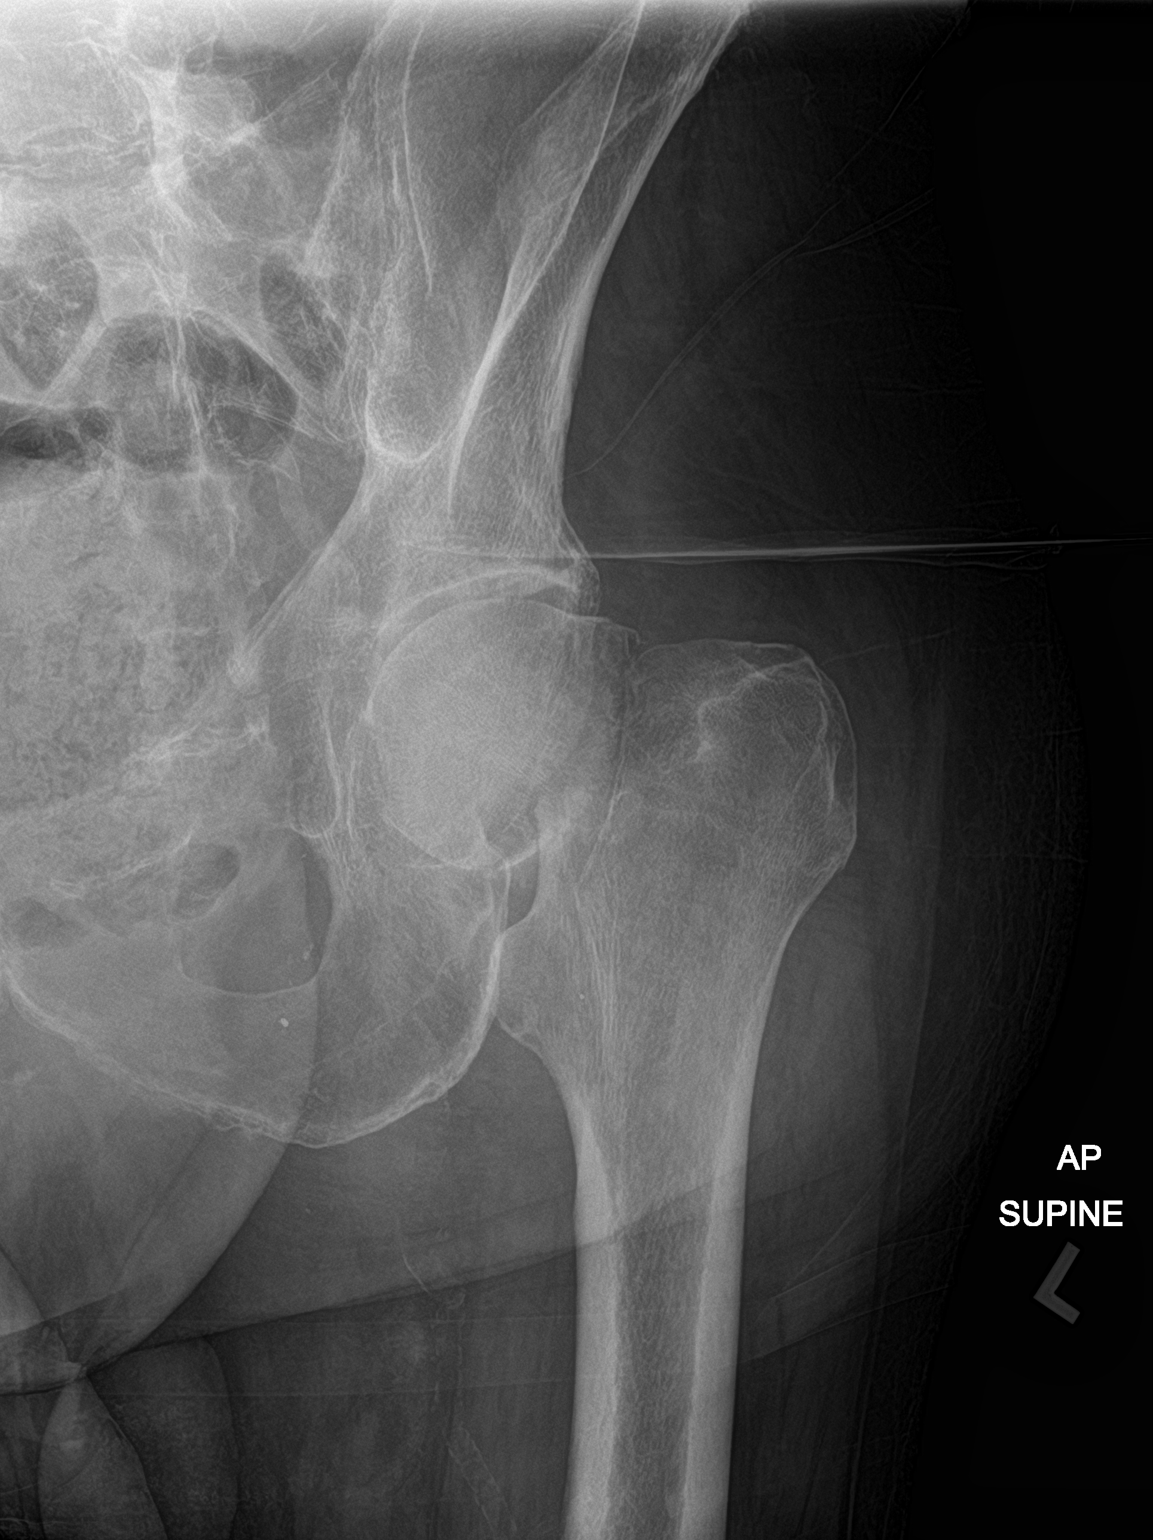

[hip x-table]
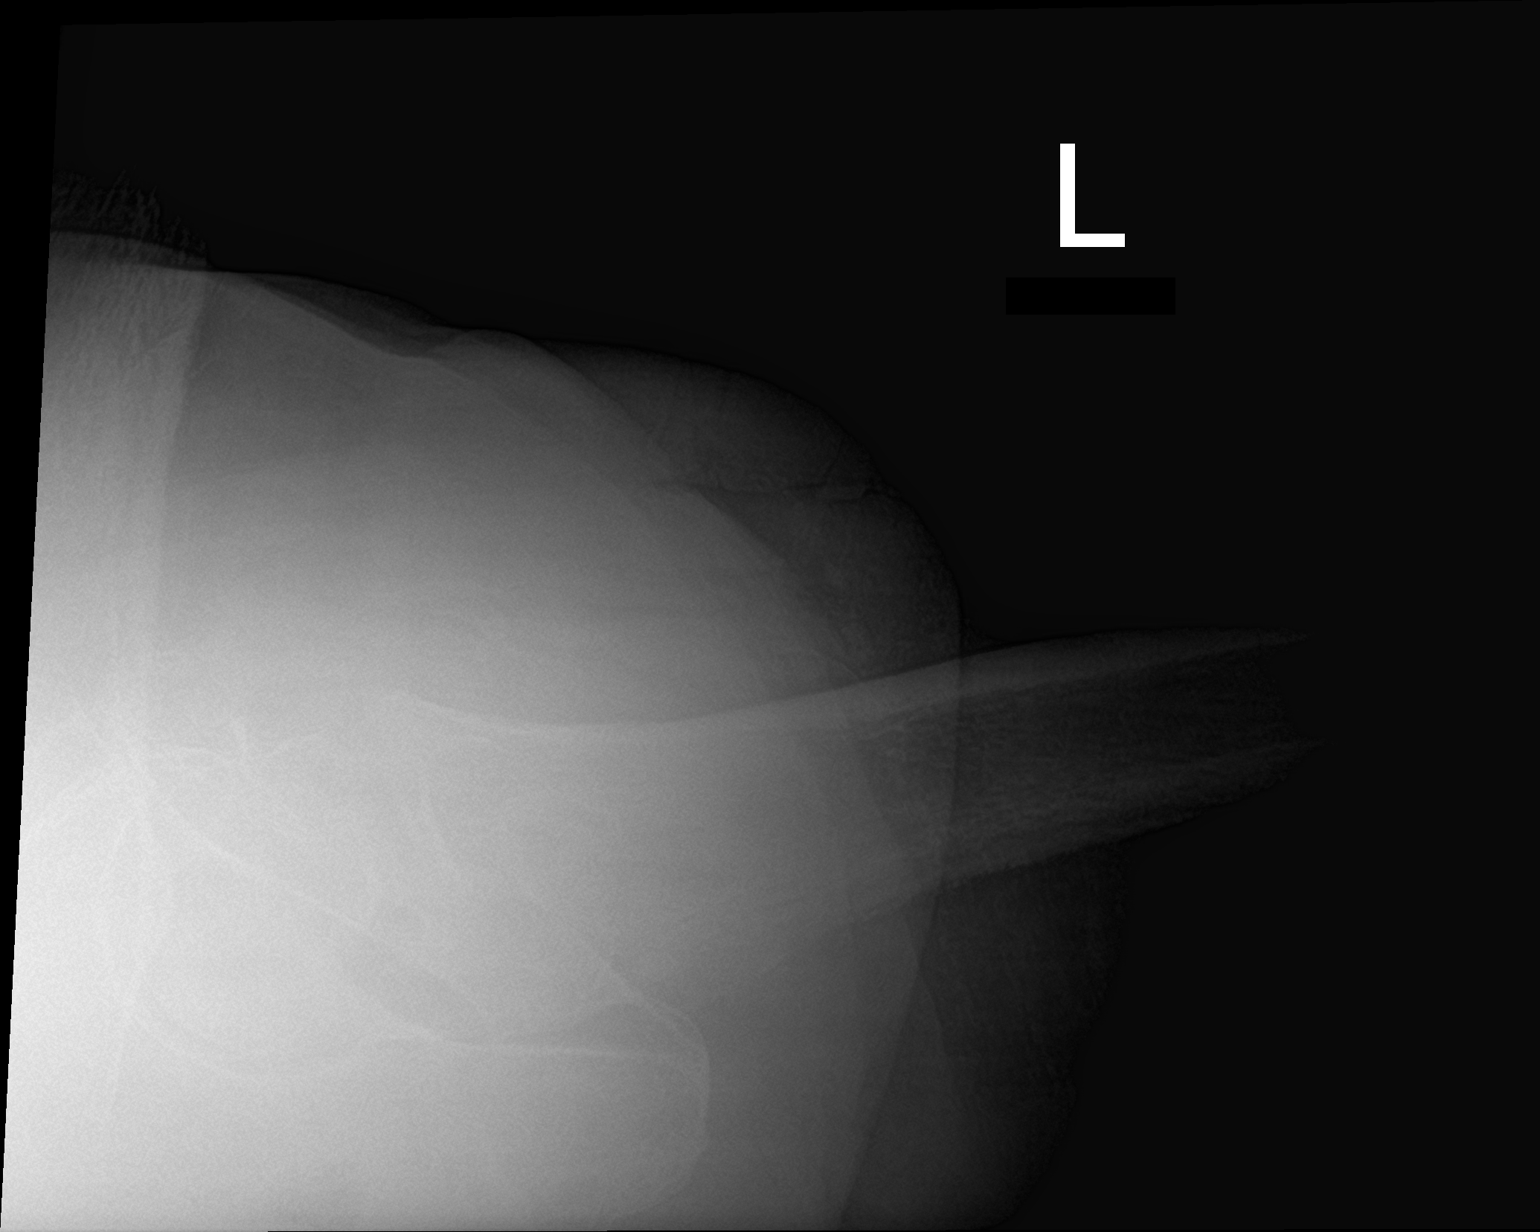

[pelvis ap]
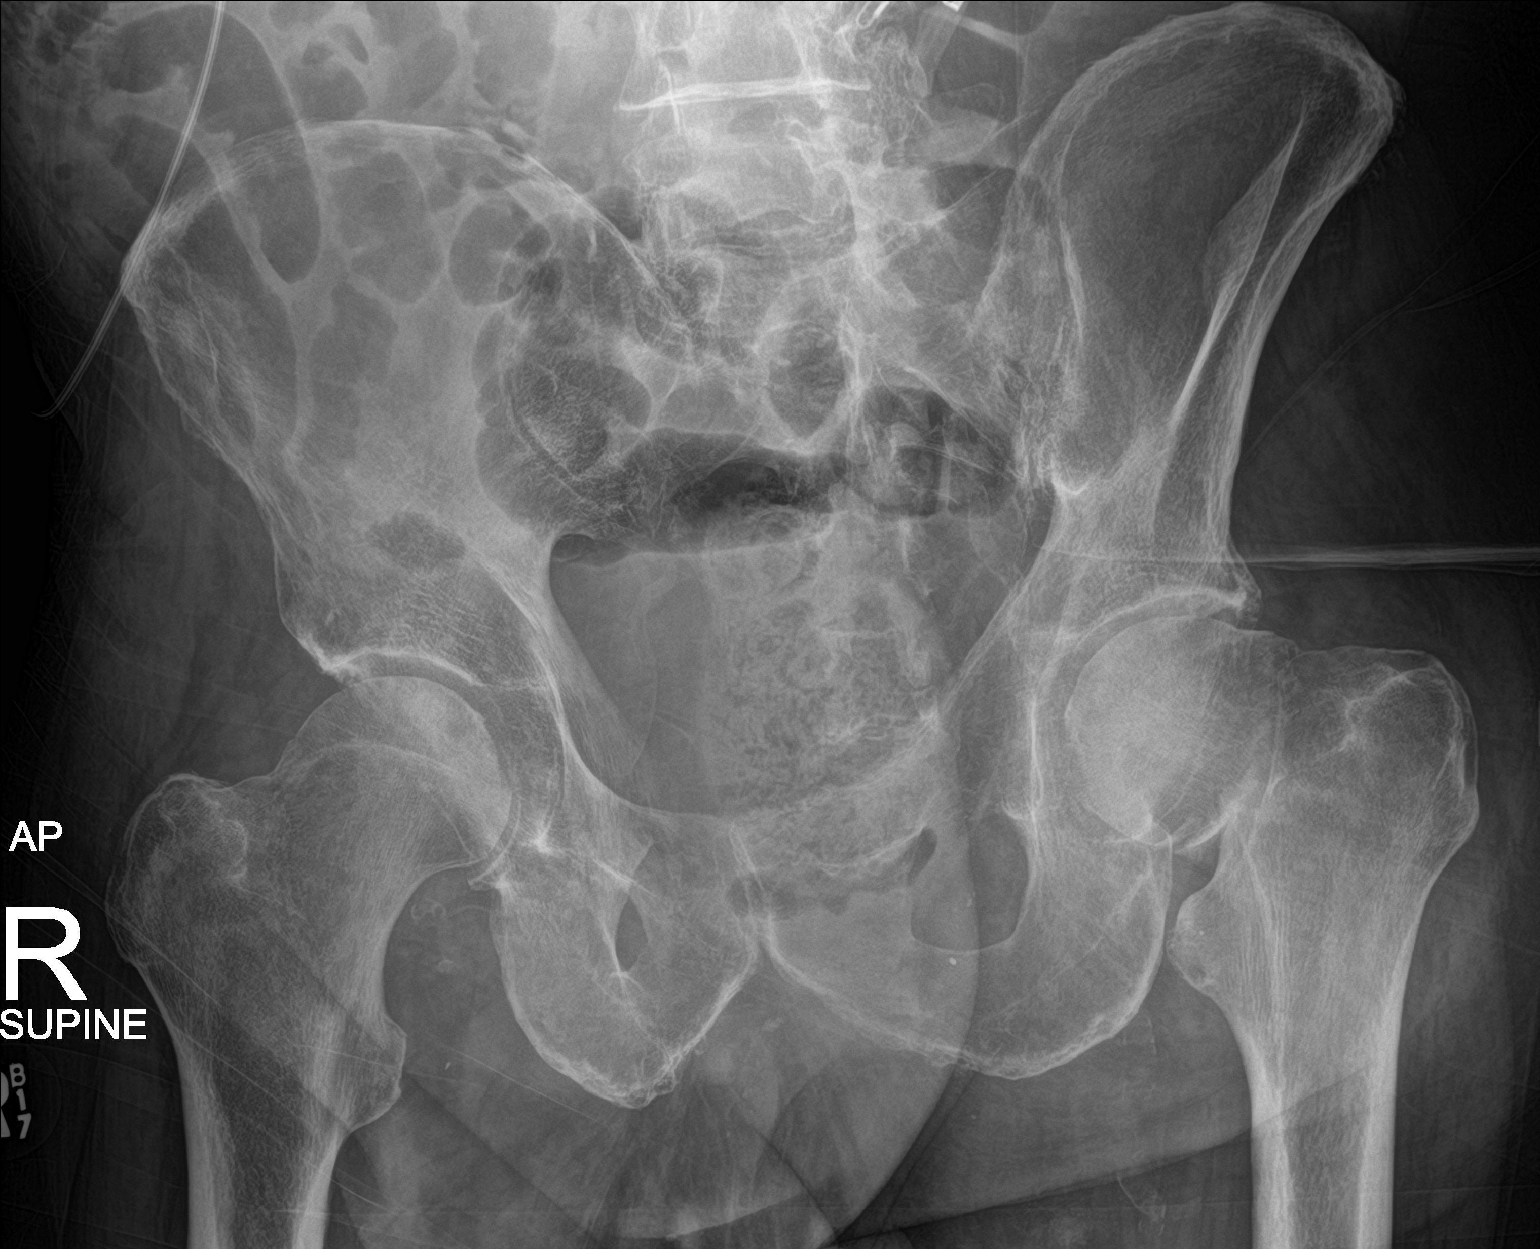

[3 of 3 positions shown; findings below may reference images not displayed]

FINDINGS: Acute subcapital fracture of the left femoral neck with superior
subluxation. Femoral head remains normally position within the
acetabulum. Femoral head height maintained. Bony pelvis intact.
Limited views of the right hip demonstrate no acute finding.
Underlying osteoarthritic changes noted about the hips bilaterally,
left greater than right.

No soft tissue abnormality.
IMPRESSION: Acute subcapital fracture of the left femoral neck with superior
subluxation.

## 2020-06-26 DIAGNOSIS — E1151 Type 2 diabetes mellitus with diabetic peripheral angiopathy without gangrene: Secondary | ICD-10-CM | POA: Diagnosis not present

## 2020-06-26 DIAGNOSIS — M2040 Other hammer toe(s) (acquired), unspecified foot: Secondary | ICD-10-CM | POA: Diagnosis not present

## 2020-06-26 DIAGNOSIS — B351 Tinea unguium: Secondary | ICD-10-CM | POA: Diagnosis not present

## 2020-06-27 DIAGNOSIS — E119 Type 2 diabetes mellitus without complications: Secondary | ICD-10-CM | POA: Diagnosis not present

## 2020-06-27 DIAGNOSIS — K219 Gastro-esophageal reflux disease without esophagitis: Secondary | ICD-10-CM | POA: Diagnosis not present

## 2020-06-27 DIAGNOSIS — I10 Essential (primary) hypertension: Secondary | ICD-10-CM | POA: Diagnosis not present

## 2020-06-27 DIAGNOSIS — F0390 Unspecified dementia without behavioral disturbance: Secondary | ICD-10-CM | POA: Diagnosis not present

## 2020-06-27 DIAGNOSIS — F329 Major depressive disorder, single episode, unspecified: Secondary | ICD-10-CM | POA: Diagnosis not present

## 2020-07-03 DIAGNOSIS — I2510 Atherosclerotic heart disease of native coronary artery without angina pectoris: Secondary | ICD-10-CM | POA: Diagnosis not present

## 2020-07-03 DIAGNOSIS — E785 Hyperlipidemia, unspecified: Secondary | ICD-10-CM | POA: Diagnosis not present

## 2020-07-03 DIAGNOSIS — K5090 Crohn's disease, unspecified, without complications: Secondary | ICD-10-CM | POA: Diagnosis not present

## 2020-07-03 DIAGNOSIS — I5022 Chronic systolic (congestive) heart failure: Secondary | ICD-10-CM | POA: Diagnosis not present

## 2020-07-03 DIAGNOSIS — I110 Hypertensive heart disease with heart failure: Secondary | ICD-10-CM | POA: Diagnosis not present

## 2020-07-03 DIAGNOSIS — F0390 Unspecified dementia without behavioral disturbance: Secondary | ICD-10-CM | POA: Diagnosis not present

## 2020-07-04 DIAGNOSIS — Z7982 Long term (current) use of aspirin: Secondary | ICD-10-CM | POA: Diagnosis not present

## 2020-07-04 DIAGNOSIS — E119 Type 2 diabetes mellitus without complications: Secondary | ICD-10-CM | POA: Diagnosis not present

## 2020-07-04 DIAGNOSIS — D649 Anemia, unspecified: Secondary | ICD-10-CM | POA: Diagnosis not present

## 2020-07-08 DIAGNOSIS — I2510 Atherosclerotic heart disease of native coronary artery without angina pectoris: Secondary | ICD-10-CM | POA: Diagnosis not present

## 2020-07-15 DIAGNOSIS — E119 Type 2 diabetes mellitus without complications: Secondary | ICD-10-CM | POA: Diagnosis not present

## 2020-07-15 DIAGNOSIS — I2510 Atherosclerotic heart disease of native coronary artery without angina pectoris: Secondary | ICD-10-CM | POA: Diagnosis not present

## 2020-07-15 DIAGNOSIS — Z7982 Long term (current) use of aspirin: Secondary | ICD-10-CM | POA: Diagnosis not present

## 2020-07-15 DIAGNOSIS — D649 Anemia, unspecified: Secondary | ICD-10-CM | POA: Diagnosis not present

## 2020-07-25 ENCOUNTER — Encounter (HOSPITAL_COMMUNITY): Payer: Self-pay

## 2020-07-25 ENCOUNTER — Inpatient Hospital Stay (HOSPITAL_COMMUNITY)
Admission: EM | Admit: 2020-07-25 | Discharge: 2020-07-29 | DRG: 177 | Disposition: A | Discharge status: Skilled Nursing Facility | Payer: PPO | Source: Skilled Nursing Facility | Attending: Internal Medicine | Admitting: Internal Medicine

## 2020-07-25 ENCOUNTER — Emergency Department (HOSPITAL_COMMUNITY): Payer: PPO

## 2020-07-25 ENCOUNTER — Other Ambulatory Visit: Payer: Self-pay

## 2020-07-25 DIAGNOSIS — R0902 Hypoxemia: Secondary | ICD-10-CM | POA: Diagnosis not present

## 2020-07-25 DIAGNOSIS — I700 Atherosclerosis of aorta: Secondary | ICD-10-CM | POA: Diagnosis not present

## 2020-07-25 DIAGNOSIS — K5010 Crohn's disease of large intestine without complications: Secondary | ICD-10-CM | POA: Diagnosis present

## 2020-07-25 DIAGNOSIS — T380X5A Adverse effect of glucocorticoids and synthetic analogues, initial encounter: Secondary | ICD-10-CM | POA: Diagnosis not present

## 2020-07-25 DIAGNOSIS — Z8261 Family history of arthritis: Secondary | ICD-10-CM

## 2020-07-25 DIAGNOSIS — U071 COVID-19: Secondary | ICD-10-CM | POA: Diagnosis not present

## 2020-07-25 DIAGNOSIS — Z79899 Other long term (current) drug therapy: Secondary | ICD-10-CM | POA: Diagnosis not present

## 2020-07-25 DIAGNOSIS — I252 Old myocardial infarction: Secondary | ICD-10-CM

## 2020-07-25 DIAGNOSIS — I5042 Chronic combined systolic (congestive) and diastolic (congestive) heart failure: Secondary | ICD-10-CM | POA: Diagnosis present

## 2020-07-25 DIAGNOSIS — I110 Hypertensive heart disease with heart failure: Secondary | ICD-10-CM | POA: Diagnosis not present

## 2020-07-25 DIAGNOSIS — Z825 Family history of asthma and other chronic lower respiratory diseases: Secondary | ICD-10-CM

## 2020-07-25 DIAGNOSIS — Z87891 Personal history of nicotine dependence: Secondary | ICD-10-CM

## 2020-07-25 DIAGNOSIS — J069 Acute upper respiratory infection, unspecified: Secondary | ICD-10-CM | POA: Diagnosis not present

## 2020-07-25 DIAGNOSIS — Z66 Do not resuscitate: Secondary | ICD-10-CM | POA: Diagnosis not present

## 2020-07-25 DIAGNOSIS — Z7982 Long term (current) use of aspirin: Secondary | ICD-10-CM

## 2020-07-25 DIAGNOSIS — Z96642 Presence of left artificial hip joint: Secondary | ICD-10-CM | POA: Diagnosis not present

## 2020-07-25 DIAGNOSIS — I10 Essential (primary) hypertension: Secondary | ICD-10-CM | POA: Diagnosis present

## 2020-07-25 DIAGNOSIS — I2510 Atherosclerotic heart disease of native coronary artery without angina pectoris: Secondary | ICD-10-CM | POA: Diagnosis present

## 2020-07-25 DIAGNOSIS — E1165 Type 2 diabetes mellitus with hyperglycemia: Secondary | ICD-10-CM | POA: Diagnosis not present

## 2020-07-25 DIAGNOSIS — G4730 Sleep apnea, unspecified: Secondary | ICD-10-CM | POA: Diagnosis not present

## 2020-07-25 DIAGNOSIS — J1282 Pneumonia due to coronavirus disease 2019: Secondary | ICD-10-CM | POA: Diagnosis present

## 2020-07-25 DIAGNOSIS — J9601 Acute respiratory failure with hypoxia: Secondary | ICD-10-CM | POA: Diagnosis not present

## 2020-07-25 DIAGNOSIS — Y92239 Unspecified place in hospital as the place of occurrence of the external cause: Secondary | ICD-10-CM | POA: Diagnosis not present

## 2020-07-25 DIAGNOSIS — Z8249 Family history of ischemic heart disease and other diseases of the circulatory system: Secondary | ICD-10-CM | POA: Diagnosis not present

## 2020-07-25 DIAGNOSIS — Z7952 Long term (current) use of systemic steroids: Secondary | ICD-10-CM

## 2020-07-25 DIAGNOSIS — I959 Hypotension, unspecified: Secondary | ICD-10-CM | POA: Diagnosis not present

## 2020-07-25 DIAGNOSIS — R05 Cough: Secondary | ICD-10-CM | POA: Diagnosis not present

## 2020-07-25 DIAGNOSIS — R0689 Other abnormalities of breathing: Secondary | ICD-10-CM | POA: Diagnosis not present

## 2020-07-25 DIAGNOSIS — K219 Gastro-esophageal reflux disease without esophagitis: Secondary | ICD-10-CM | POA: Diagnosis not present

## 2020-07-25 DIAGNOSIS — M069 Rheumatoid arthritis, unspecified: Secondary | ICD-10-CM | POA: Diagnosis present

## 2020-07-25 DIAGNOSIS — M542 Cervicalgia: Secondary | ICD-10-CM | POA: Diagnosis not present

## 2020-07-25 DIAGNOSIS — F0390 Unspecified dementia without behavioral disturbance: Secondary | ICD-10-CM | POA: Diagnosis not present

## 2020-07-25 DIAGNOSIS — T1490XA Injury, unspecified, initial encounter: Secondary | ICD-10-CM

## 2020-07-25 DIAGNOSIS — R000 Tachycardia, unspecified: Secondary | ICD-10-CM | POA: Diagnosis not present

## 2020-07-25 DIAGNOSIS — I517 Cardiomegaly: Secondary | ICD-10-CM | POA: Diagnosis not present

## 2020-07-25 DIAGNOSIS — E785 Hyperlipidemia, unspecified: Secondary | ICD-10-CM | POA: Diagnosis not present

## 2020-07-25 DIAGNOSIS — J9811 Atelectasis: Secondary | ICD-10-CM | POA: Diagnosis not present

## 2020-07-25 DIAGNOSIS — I444 Left anterior fascicular block: Secondary | ICD-10-CM | POA: Diagnosis present

## 2020-07-25 HISTORY — DX: Unspecified dementia, unspecified severity, without behavioral disturbance, psychotic disturbance, mood disturbance, and anxiety: F03.90

## 2020-07-25 LAB — CBC WITH DIFFERENTIAL/PLATELET
Basophils Absolute: 0 10*3/uL (ref 0.0–0.1)
Basophils Relative: 0 %
Eosinophils Absolute: 0.1 10*3/uL (ref 0.0–0.5)
Eosinophils Relative: 1 %
HCT: 42.3 % (ref 39.0–52.0)
Hemoglobin: 13.9 g/dL (ref 13.0–17.0)
Lymphocytes Relative: 6 %
Lymphs Abs: 0.6 10*3/uL — ABNORMAL LOW (ref 0.7–4.0)
MCH: 30 pg (ref 26.0–34.0)
MCHC: 32.9 g/dL (ref 30.0–36.0)
MCV: 91.4 fL (ref 80.0–100.0)
Monocytes Absolute: 1.8 10*3/uL — ABNORMAL HIGH (ref 0.1–1.0)
Monocytes Relative: 13 %
Neutro Abs: 6.7 10*3/uL (ref 1.7–7.7)
Neutrophils Relative %: 76 %
Platelets: 190 10*3/uL (ref 150–400)
RBC: 4.63 MIL/uL (ref 4.22–5.81)
RDW: 14.9 % (ref 11.5–15.5)
WBC: 8.7 10*3/uL (ref 4.0–10.5)
nRBC: 0 % (ref 0.0–0.2)
nRBC: 0 /100 WBC

## 2020-07-25 LAB — COMPREHENSIVE METABOLIC PANEL
ALT: 17 U/L (ref 0–44)
AST: 21 U/L (ref 15–41)
Albumin: 3.2 g/dL — ABNORMAL LOW (ref 3.5–5.0)
Alkaline Phosphatase: 63 U/L (ref 38–126)
Anion gap: 14 (ref 5–15)
BUN: 13 mg/dL (ref 8–23)
CO2: 23 mmol/L (ref 22–32)
Calcium: 8.3 mg/dL — ABNORMAL LOW (ref 8.9–10.3)
Chloride: 100 mmol/L (ref 98–111)
Creatinine, Ser: 1.11 mg/dL (ref 0.61–1.24)
GFR calc Af Amer: >60 mL/min (ref >60)
GFR calc non Af Amer: >60 mL/min (ref >60)
Glucose, Bld: 177 mg/dL — ABNORMAL HIGH (ref 70–99)
Potassium: 3.6 mmol/L (ref 3.5–5.1)
Sodium: 137 mmol/L (ref 135–145)
Total Bilirubin: 0.6 mg/dL (ref 0.3–1.2)
Total Protein: 6.2 g/dL — ABNORMAL LOW (ref 6.5–8.1)

## 2020-07-25 LAB — TRIGLYCERIDES: Triglycerides: 105 mg/dL (ref <150)

## 2020-07-25 LAB — LACTATE DEHYDROGENASE: LDH: 149 U/L (ref 98–192)

## 2020-07-25 LAB — C-REACTIVE PROTEIN: CRP: 7 mg/dL — ABNORMAL HIGH (ref <1.0)

## 2020-07-25 LAB — SARS CORONAVIRUS 2 BY RT PCR (HOSPITAL ORDER, PERFORMED IN ~~LOC~~ HOSPITAL LAB): SARS Coronavirus 2: POSITIVE — AB

## 2020-07-25 LAB — FERRITIN: Ferritin: 75 ng/mL (ref 24–336)

## 2020-07-25 LAB — FIBRINOGEN: Fibrinogen: 466 mg/dL (ref 210–475)

## 2020-07-25 LAB — LACTIC ACID, PLASMA: Lactic Acid, Venous: 2.1 mmol/L (ref 0.5–1.9)

## 2020-07-25 LAB — D-DIMER, QUANTITATIVE: D-Dimer, Quant: 1.98 ug/mL-FEU — ABNORMAL HIGH (ref 0.00–0.50)

## 2020-07-25 LAB — PROCALCITONIN: Procalcitonin: <0.1 ng/mL

## 2020-07-25 MED ORDER — ENOXAPARIN SODIUM 40 MG/0.4ML ~~LOC~~ SOLN
40.0000 mg | SUBCUTANEOUS | Status: DC
  Administered 2020-07-26 – 2020-07-28 (×3): 40 mg via SUBCUTANEOUS
  Filled 2020-07-25 (×4): qty 0.4

## 2020-07-25 MED ORDER — SODIUM CHLORIDE 0.9 % IV SOLN
100.0000 mg | Freq: Every day | INTRAVENOUS | Status: DC
  Administered 2020-07-26 – 2020-07-28 (×3): 100 mg via INTRAVENOUS
  Filled 2020-07-25 (×3): qty 20

## 2020-07-25 MED ORDER — MESALAMINE 1.2 G PO TBEC
2.4000 g | DELAYED_RELEASE_TABLET | Freq: Two times a day (BID) | ORAL | Status: DC
  Administered 2020-07-26 (×2): 2.4 g via ORAL
  Administered 2020-07-27: 1.4 g via ORAL
  Administered 2020-07-27 – 2020-07-28 (×2): 2.4 g via ORAL
  Filled 2020-07-25 (×8): qty 2

## 2020-07-25 MED ORDER — ONDANSETRON HCL 4 MG/2ML IJ SOLN
4.0000 mg | Freq: Four times a day (QID) | INTRAMUSCULAR | Status: DC | PRN
  Administered 2020-07-27: 4 mg via INTRAVENOUS
  Filled 2020-07-25: qty 2

## 2020-07-25 MED ORDER — OXYBUTYNIN CHLORIDE 5 MG PO TABS
5.0000 mg | ORAL_TABLET | Freq: Two times a day (BID) | ORAL | Status: DC
  Administered 2020-07-26 – 2020-07-28 (×7): 5 mg via ORAL
  Filled 2020-07-25 (×7): qty 1

## 2020-07-25 MED ORDER — SODIUM CHLORIDE 0.9 % IV SOLN
200.0000 mg | Freq: Once | INTRAVENOUS | Status: AC
  Administered 2020-07-26: 200 mg via INTRAVENOUS
  Filled 2020-07-25: qty 200

## 2020-07-25 MED ORDER — CARVEDILOL 6.25 MG PO TABS
6.2500 mg | ORAL_TABLET | Freq: Two times a day (BID) | ORAL | Status: DC
  Administered 2020-07-26 – 2020-07-28 (×6): 6.25 mg via ORAL
  Filled 2020-07-25 (×6): qty 2

## 2020-07-25 MED ORDER — IOHEXOL 350 MG/ML SOLN
100.0000 mL | Freq: Once | INTRAVENOUS | Status: AC | PRN
  Administered 2020-07-25: 100 mL via INTRAVENOUS

## 2020-07-25 MED ORDER — CYANOCOBALAMIN 500 MCG PO TABS
500.0000 ug | ORAL_TABLET | Freq: Every evening | ORAL | Status: DC
  Administered 2020-07-26 – 2020-07-27 (×2): 500 ug via ORAL
  Filled 2020-07-25 (×5): qty 1

## 2020-07-25 MED ORDER — ONDANSETRON HCL 4 MG PO TABS: 4.0000 mg | ORAL_TABLET | Freq: Four times a day (QID) | ORAL | Status: DC | PRN

## 2020-07-25 MED ORDER — NITROGLYCERIN 0.4 MG SL SUBL: 0.4000 mg | SUBLINGUAL_TABLET | SUBLINGUAL | Status: DC | PRN

## 2020-07-25 MED ORDER — GUAIFENESIN-DM 100-10 MG/5ML PO SYRP
10.0000 mL | ORAL_SOLUTION | ORAL | Status: DC | PRN
  Administered 2020-07-26: 10 mL via ORAL
  Filled 2020-07-25: qty 10

## 2020-07-25 MED ORDER — MAGNESIUM OXIDE 400 (241.3 MG) MG PO TABS
200.0000 mg | ORAL_TABLET | Freq: Every day | ORAL | Status: DC
  Administered 2020-07-26 – 2020-07-28 (×3): 200 mg via ORAL
  Filled 2020-07-25 (×4): qty 1

## 2020-07-25 MED ORDER — DONEPEZIL HCL 10 MG PO TABS
5.0000 mg | ORAL_TABLET | Freq: Every day | ORAL | Status: DC
  Administered 2020-07-26 – 2020-07-28 (×4): 5 mg via ORAL
  Filled 2020-07-25 (×5): qty 1

## 2020-07-25 MED ORDER — METHYLPREDNISOLONE SODIUM SUCC 125 MG IJ SOLR
60.0000 mg | Freq: Two times a day (BID) | INTRAMUSCULAR | Status: DC
  Administered 2020-07-25 – 2020-07-28 (×6): 60 mg via INTRAVENOUS
  Filled 2020-07-25 (×6): qty 2

## 2020-07-25 MED ORDER — ASCORBIC ACID 500 MG PO TABS
500.0000 mg | ORAL_TABLET | Freq: Every day | ORAL | Status: DC
  Administered 2020-07-26 – 2020-07-28 (×3): 500 mg via ORAL
  Filled 2020-07-25 (×3): qty 1

## 2020-07-25 MED ORDER — ASPIRIN 81 MG PO CHEW
81.0000 mg | CHEWABLE_TABLET | Freq: Two times a day (BID) | ORAL | Status: DC
  Administered 2020-07-26 – 2020-07-28 (×6): 81 mg via ORAL
  Filled 2020-07-25 (×6): qty 1

## 2020-07-25 MED ORDER — ZINC SULFATE 220 (50 ZN) MG PO CAPS: 220.0000 mg | ORAL_CAPSULE | Freq: Every day | ORAL | Status: DC

## 2020-07-25 MED ORDER — ZINC SULFATE 220 (50 ZN) MG PO CAPS
220.0000 mg | ORAL_CAPSULE | Freq: Every day | ORAL | Status: DC
  Administered 2020-07-26 – 2020-07-28 (×3): 220 mg via ORAL
  Filled 2020-07-25 (×3): qty 1

## 2020-07-25 MED ORDER — PRAVASTATIN SODIUM 20 MG PO TABS
40.0000 mg | ORAL_TABLET | Freq: Every day | ORAL | Status: DC
  Administered 2020-07-26 – 2020-07-28 (×4): 40 mg via ORAL
  Filled 2020-07-25 (×4): qty 2

## 2020-07-25 MED ORDER — VITAMIN A 3 MG (10000 UNIT) PO CAPS
10000.0000 [IU] | ORAL_CAPSULE | Freq: Every day | ORAL | Status: DC
  Administered 2020-07-26 – 2020-07-28 (×3): 10000 [IU] via ORAL
  Filled 2020-07-25 (×5): qty 1

## 2020-07-25 MED ORDER — LORAZEPAM 0.5 MG PO TABS
0.5000 mg | ORAL_TABLET | ORAL | Status: DC
  Administered 2020-07-26 – 2020-07-28 (×7): 0.5 mg via ORAL
  Filled 2020-07-25 (×7): qty 1

## 2020-07-25 MED ORDER — PANTOPRAZOLE SODIUM 40 MG PO TBEC
40.0000 mg | DELAYED_RELEASE_TABLET | Freq: Every day | ORAL | Status: DC
  Administered 2020-07-26 – 2020-07-28 (×3): 40 mg via ORAL
  Filled 2020-07-25 (×4): qty 1

## 2020-07-25 MED ORDER — PRIMIDONE 50 MG PO TABS
50.0000 mg | ORAL_TABLET | Freq: Every day | ORAL | Status: DC
  Administered 2020-07-26 – 2020-07-27 (×3): 50 mg via ORAL
  Filled 2020-07-25 (×5): qty 1

## 2020-07-25 MED ORDER — HYDROCOD POLST-CPM POLST ER 10-8 MG/5ML PO SUER
5.0000 mL | Freq: Two times a day (BID) | ORAL | Status: DC | PRN
  Filled 2020-07-25: qty 5

## 2020-07-25 MED ORDER — VITAMIN E 180 MG (400 UNIT) PO CAPS
400.0000 [IU] | ORAL_CAPSULE | Freq: Every day | ORAL | Status: DC
  Administered 2020-07-26 – 2020-07-28 (×3): 400 [IU] via ORAL
  Filled 2020-07-25 (×5): qty 1

## 2020-07-25 MED ORDER — DOCUSATE SODIUM 100 MG PO CAPS
100.0000 mg | ORAL_CAPSULE | Freq: Two times a day (BID) | ORAL | Status: DC
  Administered 2020-07-26 – 2020-07-28 (×6): 100 mg via ORAL
  Filled 2020-07-25 (×7): qty 1

## 2020-07-25 NOTE — H&P (Signed)
History and Physical    DASANI CREAR GMW:[id number] DOB: [birth year] DOA: 07/25/2020  PCP: Josetta Huddle, MD  Patient coming from: [place].  Chief Complaint: Cough and hypoxia.  HPI: Anthony [name] is a 86 y.o. male with history of dementia, CAD, CHF, diabetes mellitus, Crohn's disease, rheumatoid arthritis was brought to the ER patient was diagnosed with Covid infection and was found to be tachypneic and coughing.  Patient not able to provide much history because he has dementia.  ED Course: In the ER patient was hypoxic initially requiring 4 L oxygen.  Labs were remarkable for CRP of 7 D-dimer 1.98 EKG normal sinus rhythm.  CT angiogram shows bilateral infiltrates consistent with pneumonia.  Patient was started on IV steroids and remdesivir admitted for further management.  Review of Systems: As per HPI, rest all negative.   Past Medical History:  Diagnosis Date  . Arthritis    rheumatoid-  Dr Barkley Boards  . CHF (congestive heart failure) (HCC)    chronic systolic heart failure per Dr [name] [facility] note 09/23/11.   EKG 4/12, stress test  and cath from 8/12 on chart.  Clearance with LOV Dr [name] on chart  . Coronary artery disease   . Crohn disease ([place])   . Dementia ([place])   . Dysrhythmia    nonsustained,asymptomatic VT per Dr [name] office note  resulting in cath  . GERD (gastroesophageal reflux disease)   . Heart attack ([place]) 1996, 06/2011  . Hyperlipidemia   . Hypertension   . Pneumonia 1993  . Prostate troubles    states take alternating meds for bladder/prostate control- "age thing"  . Sleep apnea    sleep study years ago- has apnea but did not qualify for CPAP    Past Surgical History:  Procedure Laterality Date  . CARDIAC CATHETERIZATION     1996/ 2012 report on chart  . COLONOSCOPY    . HIP ARTHROPLASTY Left 03/04/2019   Procedure: LEFT HIP HEMIARTHROPLASTY;  Surgeon: Mcarthur Rossetti, MD;  Location: [facility];  Service: Orthopedics;  Laterality:  Left;  . INGUINAL HERNIA REPAIR  11/04/2011   Procedure: HERNIA REPAIR INGUINAL ADULT;  Surgeon: Pedro Earls, MD;  Location: [facility];  Service: General;  Laterality: Right;  Right Inguinal Hernia Repair with Mesh  . KNEE ARTHROSCOPY Right 1976  . MENISCUS DEBRIDEMENT Left 1996  . TRIGGER FINGER RELEASE Right 10/10/2009  . UMBILICAL HERNIA REPAIR  2009  . WRIST SURGERY Left 04/25/91     reports that he quit smoking about 49 years ago. His smoking use included cigarettes. He has a 60.00 pack-year smoking history. He has never used smokeless tobacco. He reports that he does not drink alcohol and does not use drugs.  No Known Allergies  Family History  Problem Relation Age of Onset  . Heart disease Mother   . Rheum arthritis Mother   . Emphysema Father     Prior to Admission medications   Medication Sig Start Date End Date Taking? Authorizing Provider  acetaminophen (TYLENOL) 325 MG tablet Take 650 mg by mouth See admin instructions. Take 2 tablets (650 mg) by mouth three times daily for pain management, may also take 2 tablets (650 mg) every 6 hours as needed for mild pain/fever   Yes [provider]  antiseptic oral rinse (BIOTENE) LIQD 15 mLs by Mouth Rinse route every 6 (six) hours as needed for dry mouth.   Yes [provider]  carvedilol (COREG) 6.25 MG tablet TAKE  1 TABLET TWICE A DAY WITH A MEAL. Patient taking differently: Take 6.25 mg by mouth 2 (two) times daily with a meal.  02/22/18  Yes Burtis Junes, NP  Cholecalciferol (VITAMIN D3) 50 MCG (2000 UT) capsule Take 2,000 Units by mouth daily.    Yes [provider]  Cyanocobalamin (B-12) 500 MCG TABS Take 500 mcg by mouth every evening.   Yes [provider]  docusate sodium (COLACE) 100 MG capsule Take 1 capsule (100 mg total) by mouth 2 (two) times daily. 03/07/19  Yes Regalado, Belkys A, MD  donepezil (ARICEPT) 5 MG tablet Take 5 mg by mouth at bedtime.   Yes [provider]    furosemide (LASIX) 40 MG tablet Take 1 tablet (40 mg total) by mouth daily. 03/11/19  Yes Amin, Ankit Chirag, MD  guaiFENesin (MUCINEX) 600 MG 12 hr tablet Take 600 mg by mouth every 12 (twelve) hours.   Yes [provider]  guaiFENesin-dextromethorphan (ROBITUSSIN DM) 100-10 MG/5ML syrup Take 5 mLs by mouth every 4 (four) hours as needed for cough. 10/13/18  Yes Arrien, Jimmy Picket, MD  HYDROcodone-acetaminophen (NORCO/VICODIN) 5-325 MG tablet Take 1-2 tablets by mouth every 4 (four) hours as needed for moderate pain (pain score 4-6). Patient taking differently: Take 2 tablets by mouth every 4 (four) hours as needed (moderate pain - pain scale 4-6 1 tablet; severe pain - pain scale 7-10 2 tablets).  03/07/19  Yes Regalado, Belkys A, MD  ipratropium-albuterol (DUONEB) 0.5-2.5 (3) MG/3ML SOLN Take 3 mLs by nebulization every 6 (six) hours as needed (shortness of breath/wheezing).   Yes [provider]  LORazepam (ATIVAN) 0.5 MG tablet Take 0.5 mg by mouth See admin instructions. Take one tablet (0.5 mg) by mouth twice daily - 4pm and bedtime   Yes [provider]  Magnesium 200 MG TABS Take 200 mg by mouth daily.   Yes [provider]  melatonin 3 MG TABS tablet Take 6 mg by mouth at bedtime.   Yes [provider]  mesalamine (LIALDA) 1.2 g EC tablet Take 2.4 g by mouth 2 (two) times daily.    Yes [provider]  nitroGLYCERIN (NITROSTAT) 0.4 MG SL tablet Place 0.4 mg under the tongue every 5 (five) minutes as needed for chest pain.    Yes [provider]  omeprazole (PRILOSEC) 40 MG capsule Take 40 mg by mouth at bedtime.   Yes [provider]  oxybutynin (DITROPAN) 5 MG tablet Take 5 mg by mouth every 12 (twelve) hours.  09/20/18  Yes [provider]  pravastatin (PRAVACHOL) 40 MG tablet Take 1 tablet (40 mg total) by mouth at bedtime. 03/08/18  Yes Belva Crome, MD  predniSONE (DELTASONE) 10 MG tablet Take 10 mg by  mouth daily.    Yes [provider]  primidone (MYSOLINE) 50 MG tablet Take 50 mg by mouth at bedtime.   Yes [provider]  sodium fluoride (PREVIDENT 5000 PLUS) 1.1 % CREA dental cream Place 1 application onto teeth See admin instructions. Daily at bedtime: Brush on teeth with a toothbrush after PM mouth care. Spit out excess and do NOT rinse   Yes [provider]  vitamin A 10000 UNIT capsule Take 10,000 Units by mouth daily.    Yes [provider]  vitamin E 400 UNIT capsule Take 400 Units by mouth daily.    Yes [provider]  zinc sulfate 220 (50 Zn) MG capsule Take 220 mg by mouth daily.  Yes [provider]  ascorbic acid (VITAMIN C) 500 MG tablet Take 1,000 mg by mouth daily. COVID prophylaxis    [provider]  aspirin 81 MG chewable tablet Chew 1 tablet (81 mg total) by mouth 2 (two) times daily after a meal. Patient not taking: Reported on 07/25/2020 03/07/19   Regalado, Jerald Kief A, MD  b complex vitamins tablet Take 1 tablet by mouth daily.    [provider]  dexamethasone (DECADRON) 6 MG tablet Take 6 mg by mouth as directed. Take 1 tablet (6 mg) Daily PRN if SATS Go Below 93% 07/25/20   [provider]  feeding supplement, ENSURE ENLIVE, (ENSURE ENLIVE) LIQD Take 237 mLs by mouth 2 (two) times daily between meals. Patient not taking: Reported on 03/23/2020 04/07/18   Debbe Odea, MD    Physical Exam: Constitutional: Moderately built and nourished. Vitals:   07/25/20 2000 07/25/20 2100 07/25/20 2130 07/25/20 2215  BP: 126/69 122/74 120/83 117/64  Pulse: 98 95 98 (!) 101  Resp: (!) 24 (!) 24 (!) 24 (!) 21  Temp:      TempSrc:      SpO2: 96% 96% 97% 93%   Eyes: Anicteric no pallor. ENMT: No discharge from the ears eyes nose or mouth. Neck: No mass felt.  No neck rigidity.  No JVD appreciated. Respiratory: No rhonchi or crepitations. Cardiovascular: S1-S2 heard. Abdomen: Soft nontender bowel sounds  present. Musculoskeletal: No edema. Skin: No rash. Neurologic: Alert awake oriented to his name.  Moves all extremities. Psychiatric: Oriented to his name.   Labs on Admission: I have personally reviewed following labs and imaging studies  CBC: Recent Labs  Lab 07/25/20 2000  WBC 8.7  NEUTROABS PENDING  HGB 13.9  HCT 42.3  MCV 91.4  PLT 096   Basic Metabolic Panel: Recent Labs  Lab 07/25/20 2000  NA 137  K 3.6  CL 100  CO2 23  GLUCOSE 177*  BUN 13  CREATININE 1.11  CALCIUM 8.3*   GFR: CrCl cannot be calculated (Unknown ideal weight.). Liver Function Tests: Recent Labs  Lab 07/25/20 2000  AST 21  ALT 17  ALKPHOS 63  BILITOT 0.6  PROT 6.2*  ALBUMIN 3.2*   No results for input(s): LIPASE, AMYLASE in the last 168 hours. No results for input(s): AMMONIA in the last 168 hours. Coagulation Profile: No results for input(s): INR, PROTIME in the last 168 hours. Cardiac Enzymes: No results for input(s): CKTOTAL, CKMB, CKMBINDEX, TROPONINI in the last 168 hours. BNP (last 3 results) No results for input(s): PROBNP in the last 8760 hours. HbA1C: No results for input(s): HGBA1C in the last 72 hours. CBG: No results for input(s): GLUCAP in the last 168 hours. Lipid Profile: Recent Labs    07/25/20 2000  TRIG 105   Thyroid Function Tests: No results for input(s): TSH, T4TOTAL, FREET4, T3FREE, THYROIDAB in the last 72 hours. Anemia Panel: Recent Labs    07/25/20 2000  FERRITIN 75   Urine analysis:    Component Value Date/Time   COLORURINE YELLOW 03/23/2020 1507   APPEARANCEUR CLEAR 03/23/2020 1507   LABSPEC >1.046 (H) 03/23/2020 1507   PHURINE 6.0 03/23/2020 1507   GLUCOSEU NEGATIVE 03/23/2020 1507   HGBUR NEGATIVE 03/23/2020 1507   BILIRUBINUR NEGATIVE 03/23/2020 1507   KETONESUR 5 (A) 03/23/2020 1507   PROTEINUR NEGATIVE 03/23/2020 1507   UROBILINOGEN 0.2 11/05/2014 0303   NITRITE NEGATIVE 03/23/2020 1507   LEUKOCYTESUR NEGATIVE 03/23/2020 1507     Sepsis Labs: @LABRCNTIP (procalcitonin:4,lacticidven:4) )  Recent Results (from the past 240 hour(s))  SARS Coronavirus 2 by RT PCR (hospital order, performed in [facility] hospital lab) Nasopharyngeal Nasopharyngeal Swab     Status: Abnormal   Collection Time: 07/25/20  7:12 PM   Specimen: Nasopharyngeal Swab  Result Value Ref Range Status   SARS Coronavirus 2 POSITIVE (A) NEGATIVE Final    Comment: RESULT CALLED TO, READ BACK BY AND VERIFIED WITH: ILENE HODGES @ 2105 ON 07/25/20 C VARNER (NOTE) SARS-CoV-2 target nucleic acids are DETECTED  SARS-CoV-2 RNA is generally detectable in upper respiratory specimens  during the acute phase of infection.  Positive results are indicative  of the presence of the identified virus, but do not rule out bacterial infection or co-infection with other pathogens not detected by the test.  Clinical correlation with patient history and  other diagnostic information is necessary to determine patient infection status.  The expected result is negative.  Fact Sheet for Patients:   StrictlyIdeas.no   Fact Sheet for Healthcare Providers:   BankingDealers.co.za    This test is not yet approved or cleared by the [place] FDA and  has been authorized for detection and/or diagnosis of SARS-CoV-2 by FDA under an Emergency Use Authorization (EUA).  This EUA will remain in effect (meaning thi s test can be used) for the duration of  the COVID-19 declaration under Section 564(b)(1) of the Act, 21 U.S.C. section 360-bbb-3(b)(1), unless the authorization is terminated or revoked sooner.  Performed at [facility], [place] [address] [place], [place] [address]   Blood Culture (routine x 2)     Status: None (Preliminary result)   Collection Time: 07/25/20  8:00 PM   Specimen: BLOOD RIGHT FOREARM  Result Value Ref Range Status   Specimen Description   Final    BLOOD RIGHT FOREARM Performed  at [facility], [place] [address], [place], [place] [address]    Special Requests   Final    BOTTLES DRAWN AEROBIC AND ANAEROBIC Blood Culture adequate volume Performed at [facility] [address], [place], [place] [address]    Culture PENDING  Incomplete   Report Status PENDING  Incomplete     Radiological Exams on Admission: CT Angio Chest PE W/Cm &/Or Wo Cm  Result Date: 07/25/2020 CLINICAL DATA:  COVID positive and cough EXAM: CT ANGIOGRAPHY CHEST WITH CONTRAST TECHNIQUE: Multidetector CT imaging of the chest was performed using the standard protocol during bolus administration of intravenous contrast. Multiplanar CT image reconstructions and MIPs were obtained to evaluate the vascular anatomy. CONTRAST:  188m OMNIPAQUE IOHEXOL 350 MG/ML SOLN COMPARISON:  March 01, 2019 FINDINGS: Cardiovascular: There is a optimal opacification of the pulmonary arteries. There is no central,segmental, or subsegmental filling defects within the pulmonary arteries. There is mild cardiomegaly. No pericardial effusion or thickening. No evidence right heart strain. There is normal three-vessel brachiocephalic anatomy without proximal stenosis. Scattered aortic atherosclerosis is seen. Coronary artery calcifications are seen. Mediastinum/Nodes: No hilar, mediastinal, or axillary adenopathy. Again noted is a heterogeneously nodular left thyroid gland. The esophagus and bronchi are unremarkable. Lungs/Pleura: Streaky patchy airspace opacity seen at the periphery of both lower lungs, right greater than left. No large airspace consolidation or pleural effusion. Upper Abdomen: No acute abnormalities present in the visualized portions of the upper abdomen. Musculoskeletal: No chest wall abnormality. No acute or significant osseous findings. Review of the MIP images confirms the above findings. IMPRESSION: No central, segmental, or subsegmental pulmonary embolism. Hazy ground-glass patchy airspace  opacity seen at both lung bases, right greater than left which could be due to atelectasis and/or infectious etiology. Electronically Signed   By: Prudencio Pair M.D.   On: 07/25/2020 21:58   DG Chest Port 1 View  Result Date: 07/25/2020 CLINICAL DATA:  Cough with COVID positive. EXAM: PORTABLE CHEST 1 VIEW COMPARISON:  Mar 23, 2020 FINDINGS: Mild atelectasis is seen within the bilateral lung bases. There is no evidence of a pleural effusion or pneumothorax. The heart size and mediastinal contours are within normal limits. The visualized skeletal structures are unremarkable. IMPRESSION: Mild bibasilar atelectasis. Electronically Signed   By: Virgina Norfolk M.D.   On: 07/25/2020 19:18    EKG: Independently reviewed.  Normal sinus rhythm.  Assessment/Plan Principal Problem:   Acute respiratory disease due to COVID-19 virus Active Problems:   Rheumatoid arthritis (HCC)   Chronic combined systolic and diastolic heart failure (HCC)   Crohn's disease of large bowel (HCC)   Benign essential HTN    1. Acute respiratory failure with hypoxia secondary to COVID-19 infection for which I have continued patient on Solu-Medrol remdesivir.  Closely follow respiratory status and inflammatory markers. 2. History of CAD and CHF presently appears compensated will hold off Lasix for now.  Continue aspirin statins and beta-blockers. 3. History of rheumatoid arthritis on steroids presently on IV Solu-Medrol. 4. History of Crohn's disease on mesalamine. 5. Diabetes mellitus type 2 we will keep patient on sliding scale coverage.  Note that patient is on steroids may need long-acting insulin. 6. Dementia on Aricept. 7. Hypertension on carvedilol.  Since patient has acute respiratory failure with CT scan showing bilateral infiltrates and elevated inflammatory markers requiring oxygen to maintain sats will need close monitoring for any further worsening in inpatient status.   DVT prophylaxis: Lovenox. Code Status:  DNR. Family Communication: We will need to reach out to the family. Disposition Plan: Back to facility when stable. Consults called: None. Admission status: Inpatient.   Rise Patience MD [facility] Pager [phone].  If 7PM-7AM, please contact night-coverage [email or web address] Password [facility]  07/25/2020, 10:37 PM

## 2020-07-25 NOTE — ED Provider Notes (Signed)
[facility] DEPT Provider Note   CSN: [id number] Arrival date & time: 07/25/20  1752     History Chief Complaint  Patient presents with  . COVID+  . Cough    Anthony [name] is a 86 y.o. male.  Patient sent in from [facility] by EMS.  He tested positive for Covid today.  Is not certain whether he has had any of the vaccines.  Patient has dementia.  They state he has had a cough.  His room air sats for EMS were 92%.  But apparently they were below that there his heart rate also was 1 10-1 20 respirations 22-30.  He was placed on 2 L of oxygen brought his oxygen sats up to 94%.  Patient appears as if he is having short of breath but he denies any of that.  He is also was given 1 g of Tylenol so he must of had a fever.  And 500 cc of normal saline.  Nursing facility seems to imply that his dementia is baseline.        Past Medical History:  Diagnosis Date  . Arthritis    rheumatoid-  Dr Barkley Boards  . CHF (congestive heart failure) (HCC)    chronic systolic heart failure per Dr [name] LOV note 09/23/11.   EKG 4/12, stress test  and cath from 8/12 on chart.  Clearance with LOV Dr [name] on chart  . Coronary artery disease   . Crohn disease ([place])   . Dementia ([place])   . Dysrhythmia    nonsustained,asymptomatic VT per Dr [name] office note  resulting in cath  . GERD (gastroesophageal reflux disease)   . Heart attack ([place]) 1996, 06/2011  . Hyperlipidemia   . Hypertension   . Pneumonia 1993  . Prostate troubles    states take alternating meds for bladder/prostate control- "age thing"  . Sleep apnea    sleep study years ago- has apnea but did not qualify for CPAP    Patient Active Problem List   Diagnosis Date Noted  . Hypokalemia 03/23/2020  . S/P hip hemiarthroplasty 04/19/2019  . Pressure injury of skin 03/05/2019  . Suspected COVID-19 virus infection 03/01/2019  . Acute respiratory failure with hypoxia ([place]) 03/01/2019  . Hip fracture ([place])  03/01/2019  . Fall 03/01/2019  . Displaced fracture of base of neck of left femur, initial encounter for closed fracture ([place])   . Fever   . Nonspecific interstitial pneumonia ([place])   . Febrile illness   . Thrush   . Hypoxia   . Sepsis ([place]) 10/04/2018  . Fatty liver 04/06/2018  . Acute pancreatitis 04/04/2018  . Abdominal pain, right lower quadrant 05/28/2015  . Absolute anemia 05/28/2015  . Aortic atherosclerosis ([place]) 05/28/2015  . Altered blood in stool 05/28/2015  . Chronic systolic heart failure ([place]) 05/28/2015  . Syncope and collapse 05/28/2015  . Arteriosclerosis of coronary artery 05/28/2015  . Cough 05/28/2015  . Crohn's disease of large bowel ([place]) 05/28/2015  . Diarrhea 05/28/2015  . Displacement of cervical intervertebral disc without myelopathy 05/28/2015  . Benign essential HTN 05/28/2015  . Adenopathy 05/28/2015  . Hypercholesterolemia without hypertriglyceridemia 05/28/2015  . Hypo-osmolality and hyponatremia 05/28/2015  . Postinflammatory pulmonary fibrosis ([place]) 05/28/2015  . Healed myocardial infarct 05/28/2015  . Peptic esophagitis 05/28/2015  . 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 Paroxysmal ventricular tachycardia ([place]) 05/28/2015  . Decreased body weight 05/28/2015  . Weakness 11/05/2014  . Nausea with vomiting 11/05/2014  . Generalized weakness   .  Dehydration   . Difficulty walking   . Pulmonary infiltrates 07/16/2014  . Chronic combined systolic and diastolic heart failure ([place]) 05/31/2014  . Essential hypertension 05/31/2014  .  H/O Right inguinal hernia repair 01/15/2012  . Small fat containing right inguinal hernia that has been painful 08/05/2011  . Finger wound, simple, open 10/12/2008  . Benign prostatic hyperplasia without urinary obstruction 04/30/2008  . Synovitis and tenosynovitis 04/30/2008  . HLD (hyperlipidemia) 04/12/2008  . COLONIC POLYPS 11/21/2007  . MI 11/21/2007  . Coronary atherosclerosis 11/21/2007  . GERD 11/21/2007  . Rheumatoid  arthritis ([place]) 11/21/2007  . SLEEP APNEA, MILD 11/21/2007    Past Surgical History:  Procedure Laterality Date  . CARDIAC CATHETERIZATION     1996/ 2012 report on chart  . COLONOSCOPY    . HIP ARTHROPLASTY Left 03/04/2019   Procedure: LEFT HIP HEMIARTHROPLASTY;  Surgeon: Mcarthur Rossetti, MD;  Location: [facility];  Service: Orthopedics;  Laterality: Left;  . INGUINAL HERNIA REPAIR  11/04/2011   Procedure: HERNIA REPAIR INGUINAL ADULT;  Surgeon: Pedro Earls, MD;  Location: [facility];  Service: General;  Laterality: Right;  Right Inguinal Hernia Repair with Mesh  . KNEE ARTHROSCOPY Right 1976  . MENISCUS DEBRIDEMENT Left 1996  . TRIGGER FINGER RELEASE Right 10/10/2009  . UMBILICAL HERNIA REPAIR  2009  . WRIST SURGERY Left 04/25/91       Family History  Problem Relation Age of Onset  . Heart disease Mother   . Rheum arthritis Mother   . Emphysema Father     Social History   Tobacco Use  . Smoking status: Former Smoker    Packs/day: 3.00    Years: 20.00    Pack years: 60.00    Types: Cigarettes    Quit date: 11/01/1970    Years since quitting: 49.7  . Smokeless tobacco: Never Used  . Tobacco comment: quit 1967  Vaping Use  . Vaping Use: Never used  Substance Use Topics  . Alcohol use: No  . Drug use: No    Home Medications Prior to Admission medications   Medication Sig Start Date End Date Taking? Authorizing Provider  acetaminophen (TYLENOL) 325 MG tablet Take 650 mg by mouth See admin instructions. Take 2 tablets (650 mg) by mouth three times daily for pain management, may also take 2 tablets (650 mg) every 6 hours as needed for mild pain/fever    [provider]  ascorbic acid (VITAMIN C) 500 MG tablet Take 1,000 mg by mouth daily. COVID prophylaxis    [provider]  aspirin 81 MG chewable tablet Chew 1 tablet (81 mg total) by mouth 2 (two) times daily after a meal. 03/07/19   Regalado, Belkys A, MD  b complex vitamins tablet Take 1 tablet by  mouth daily.    [provider]  carvedilol (COREG) 6.25 MG tablet TAKE 1 TABLET TWICE A DAY WITH A MEAL. Patient taking differently: Take 6.25 mg by mouth 2 (two) times daily with a meal.  02/22/18   Burtis Junes, NP  Cholecalciferol (VITAMIN D3) 50 MCG (2000 UT) capsule Take 2,000 Units by mouth daily.     [provider]  Cyanocobalamin (B-12) 500 MCG TABS Take 500 mcg by mouth every evening.    [provider]  docusate sodium (COLACE) 100 MG capsule Take 1 capsule (100 mg total) by mouth 2 (two) times daily. 03/07/19   Regalado, Belkys A, MD  donepezil (ARICEPT) 5 MG tablet Take 5 mg by mouth  at bedtime.    [provider]  feeding supplement, ENSURE ENLIVE, (ENSURE ENLIVE) LIQD Take 237 mLs by mouth 2 (two) times daily between meals. Patient not taking: Reported on 03/23/2020 04/07/18   Debbe Odea, MD  FLUoxetine (PROZAC) 10 MG capsule Take 10 mg by mouth daily.    [provider]  furosemide (LASIX) 40 MG tablet Take 1 tablet (40 mg total) by mouth daily. 03/11/19   Amin, Ankit Chirag, MD  guaiFENesin-dextromethorphan (ROBITUSSIN DM) 100-10 MG/5ML syrup Take 5 mLs by mouth every 4 (four) hours as needed for cough. 10/13/18   Arrien, Jimmy Picket, MD  HYDROcodone-acetaminophen (NORCO/VICODIN) 5-325 MG tablet Take 1-2 tablets by mouth every 4 (four) hours as needed for moderate pain (pain score 4-6). Patient taking differently: Take 1-2 tablets by mouth every 4 (four) hours as needed (moderate pain - pain scale 4-6 1 tablet; severe pain - pain scale 7-10 2 tablets).  03/07/19   Regalado, Belkys A, MD  ipratropium-albuterol (DUONEB) 0.5-2.5 (3) MG/3ML SOLN Take 3 mLs by nebulization every 6 (six) hours as needed (shortness of breath/wheezing).    [provider]  ivermectin (STROMECTOL) 3 MG TABS tablet Take 3 mg by mouth every Friday. COVID prophylaxis    [provider]  ketoconazole (NIZORAL) 2 % cream Apply 1 application  topically 2 (two) times daily. Apply to face - 4 week course ordered 03/21/2020    [provider]  LORazepam (ATIVAN) 0.5 MG tablet Take 0.5 mg by mouth See admin instructions. Take one tablet (0.5 mg) by mouth twice daily - 4pm and bedtime    [provider]  Magnesium 200 MG TABS Take 200 mg by mouth daily.    [provider]  mesalamine (LIALDA) 1.2 g EC tablet Take 2.4 g by mouth 2 (two) times daily.     [provider]  nitroGLYCERIN (NITROSTAT) 0.4 MG SL tablet Place 0.4 mg under the tongue every 5 (five) minutes as needed for chest pain.     [provider]  omeprazole (PRILOSEC) 40 MG capsule Take 40 mg by mouth at bedtime.    [provider]  oxybutynin (DITROPAN) 5 MG tablet Take 5 mg by mouth every 12 (twelve) hours.  09/20/18   [provider]  pravastatin (PRAVACHOL) 40 MG tablet Take 1 tablet (40 mg total) by mouth at bedtime. 03/08/18   Belva Crome, MD  predniSONE (DELTASONE) 10 MG tablet Take 10 mg by mouth daily.     [provider]  primidone (MYSOLINE) 50 MG tablet Take 50 mg by mouth at bedtime.    [provider]  sodium fluoride (PREVIDENT 5000 PLUS) 1.1 % CREA dental cream Place 1 application onto teeth See admin instructions. Daily at bedtime: Brush on teeth with a toothbrush after PM mouth care. Spit out excess and do NOT rinse    [provider]  tamsulosin (FLOMAX) 0.4 MG CAPS capsule Take 0.4 mg by mouth at bedtime.  10/01/14   [provider]  vitamin A 10000 UNIT capsule Take 10,000 Units by mouth daily.     [provider]  vitamin E 400 UNIT capsule Take 400 Units by mouth daily.     [provider]  zinc sulfate 220 (50 Zn) MG capsule Take 220 mg by mouth daily.    [provider]    Allergies    Patient has no known allergies.  Review of Systems   Review of Systems  Unable to perform ROS:  Dementia    Physical Exam Updated Vital  Signs BP 119/68   Pulse (!) 103   Temp 99.6 F (37.6 C) (Oral)   Resp 20   SpO2 95%   Physical Exam Vitals and nursing note reviewed.  Constitutional:      Appearance: Normal appearance. He is well-developed.  HENT:     Head: Normocephalic and atraumatic.  Eyes:     Extraocular Movements: Extraocular movements intact.     Conjunctiva/sclera: Conjunctivae normal.     Pupils: Pupils are equal, round, and reactive to light.  Cardiovascular:     Rate and Rhythm: Regular rhythm. Tachycardia present.     Heart sounds: No murmur heard.   Pulmonary:     Effort: Respiratory distress present.     Breath sounds: Normal breath sounds.  Abdominal:     Palpations: Abdomen is soft.     Tenderness: There is no abdominal tenderness.  Musculoskeletal:        General: Normal range of motion.     Cervical back: Neck supple.  Skin:    General: Skin is warm and dry.  Neurological:     Mental Status: He is alert. Mental status is at baseline.     ED Results / Procedures / Treatments   Labs (all labs ordered are listed, but only abnormal results are displayed) Labs Reviewed  SARS CORONAVIRUS 2 BY RT PCR (HOSPITAL ORDER, [facility])  CULTURE, BLOOD (ROUTINE X 2)  CULTURE, BLOOD (ROUTINE X 2)  LACTIC ACID, PLASMA  LACTIC ACID, PLASMA  CBC WITH DIFFERENTIAL/PLATELET  COMPREHENSIVE METABOLIC PANEL  D-DIMER, QUANTITATIVE (NOT AT [facility])  PROCALCITONIN  LACTATE DEHYDROGENASE  FERRITIN  TRIGLYCERIDES  FIBRINOGEN  C-REACTIVE PROTEIN    EKG None   ED ECG REPORT   Date: 07/25/2020  Rate: 98  Rhythm: normal sinus rhythm  QRS Axis: normal  Intervals: normal  ST/T Wave abnormalities: normal  Conduction Disutrbances:left anterior fascicular block  Narrative Interpretation:   Old EKG Reviewed: unchanged No significant change from previous EKG.  Left anterior fascicular block is been there. I have personally reviewed the EKG tracing and agree with the  computerized printout as noted.   Radiology No results found.  Procedures Procedures (including critical care time) CRITICAL CARE Performed by: Fredia Sorrow Total critical care time: 45 minutes Critical care time was exclusive of separately billable procedures and treating other patients. Critical care was necessary to treat or prevent imminent or life-threatening deterioration. Critical care was time spent personally by me on the following activities: development of treatment plan with patient and/or surrogate as well as nursing, discussions with consultants, evaluation of patient's response to treatment, examination of patient, obtaining history from patient or surrogate, ordering and performing treatments and interventions, ordering and review of laboratory studies, ordering and review of radiographic studies, pulse oximetry and re-evaluation of patient's condition.   Medications Ordered in ED Medications  methylPREDNISolone sodium succinate (SOLU-MEDROL) 125 mg/2 mL injection 60 mg (has no administration in time range)    ED Course  I have reviewed the triage vital signs and the nursing notes.  Pertinent labs & imaging results that were available during my care of the patient were reviewed by me and considered in my medical decision making (see chart for details).    MDM Rules/Calculators/A&P                         Patient brought in from nursing facility.  [facility].  Had positive Covid test today.  Patient with cough.  EMS reported that room air sats were 92%.  But patient placed on 2 L and it was just 94%.  Respiratory rate was up he was tachypneic and he was tachycardic heart rate around 120.  Patient given 1 g of Tylenol.  So he must of had a fever as well.  And 500 cc of normal saline.  Upon arrival here temp was 99 pulse was 112 respirations were 20.  Initial blood pressure was 91/60.  On the 2 L of oxygen sats were 95%.  Patient has dementia.  Patient's oxygen  was turned off and he was observed and his sats went down to 88% on room air. Rate was around 100.  Blood pressure most recently 721 systolic.  This x-ray not very suggestive of Covid pneumonia.  Covid labs have been ordered and are pending.  We will continue the patient on 2 L of oxygen.  Interesting that his oxygen sats now are currently above 95%.  Clinically do not think patient had PE.  We will see what we get on the rest of the testing.  Patient will most likely require admission due to the hypoxia secondary to Covid.  Did order Solu-Medrol 1 g to severe already.  In addition patient needs repeat Covid testing here because is not in our system.  Since chest x-ray did not show significant multifocal pneumonia findings. And although D-dimer was probably elevated due to Covid. CT angio was done no evidence of pulmonary embolus. That does show bilateral lower lung field hazy opacities. Which may be early Covid changes.  We will discuss with hospitalist regarding admission for Covid with hypoxia.  Final Clinical Impression(s) / ED Diagnoses Final diagnoses:  COVID-19 virus infection  Hypoxia    Rx / DC Orders ED Discharge Orders    None       Fredia Sorrow, MD 07/25/20 2212

## 2020-07-25 NOTE — ED Triage Notes (Signed)
Pt BIB EMS from [facility]. Pt COVID+ today 9/2. Pt c/o cough. EMS reports 92% RA. HR 110-120. Resp 22-30. Pt has hx of dementia. Pt 2L [place] 94%. Pt denies SOB.  20G R FA Tylenol 1000g given by EMS 500 mL NS given

## 2020-07-26 LAB — COMPREHENSIVE METABOLIC PANEL
ALT: 18 U/L (ref 0–44)
AST: 21 U/L (ref 15–41)
Albumin: 3.3 g/dL — ABNORMAL LOW (ref 3.5–5.0)
Alkaline Phosphatase: 65 U/L (ref 38–126)
Anion gap: 12 (ref 5–15)
BUN: 14 mg/dL (ref 8–23)
CO2: 22 mmol/L (ref 22–32)
Calcium: 8.3 mg/dL — ABNORMAL LOW (ref 8.9–10.3)
Chloride: 104 mmol/L (ref 98–111)
Creatinine, Ser: 0.92 mg/dL (ref 0.61–1.24)
GFR calc Af Amer: >60 mL/min (ref >60)
GFR calc non Af Amer: >60 mL/min (ref >60)
Glucose, Bld: 255 mg/dL — ABNORMAL HIGH (ref 70–99)
Potassium: 3.7 mmol/L (ref 3.5–5.1)
Sodium: 138 mmol/L (ref 135–145)
Total Bilirubin: 0.8 mg/dL (ref 0.3–1.2)
Total Protein: 6.3 g/dL — ABNORMAL LOW (ref 6.5–8.1)

## 2020-07-26 LAB — CBC WITH DIFFERENTIAL/PLATELET
Abs Immature Granulocytes: 0.05 10*3/uL (ref 0.00–0.07)
Basophils Absolute: 0 10*3/uL (ref 0.0–0.1)
Basophils Relative: 0 %
Eosinophils Absolute: 0 10*3/uL (ref 0.0–0.5)
Eosinophils Relative: 0 %
HCT: 43 % (ref 39.0–52.0)
Hemoglobin: 14.3 g/dL (ref 13.0–17.0)
Immature Granulocytes: 1 %
Lymphocytes Relative: 4 %
Lymphs Abs: 0.3 10*3/uL — ABNORMAL LOW (ref 0.7–4.0)
MCH: 30.3 pg (ref 26.0–34.0)
MCHC: 33.3 g/dL (ref 30.0–36.0)
MCV: 91.1 fL (ref 80.0–100.0)
Monocytes Absolute: 0.2 10*3/uL (ref 0.1–1.0)
Monocytes Relative: 2 %
Neutro Abs: 6.9 10*3/uL (ref 1.7–7.7)
Neutrophils Relative %: 93 %
Platelets: 178 10*3/uL (ref 150–400)
RBC: 4.72 MIL/uL (ref 4.22–5.81)
RDW: 14.8 % (ref 11.5–15.5)
WBC: 7.4 10*3/uL (ref 4.0–10.5)
nRBC: 0 % (ref 0.0–0.2)

## 2020-07-26 LAB — CBG MONITORING, ED
Glucose-Capillary: 215 mg/dL — ABNORMAL HIGH (ref 70–99)
Glucose-Capillary: 216 mg/dL — ABNORMAL HIGH (ref 70–99)
Glucose-Capillary: 195 mg/dL — ABNORMAL HIGH (ref 70–99)
Glucose-Capillary: 165 mg/dL — ABNORMAL HIGH (ref 70–99)

## 2020-07-26 LAB — D-DIMER, QUANTITATIVE: D-Dimer, Quant: 1.8 ug/mL-FEU — ABNORMAL HIGH (ref 0.00–0.50)

## 2020-07-26 LAB — HEMOGLOBIN A1C
Hgb A1c MFr Bld: 7.2 % — ABNORMAL HIGH (ref 4.8–5.6)
Mean Plasma Glucose: 159.94 mg/dL

## 2020-07-26 LAB — ABO/RH: ABO/RH(D): B POS

## 2020-07-26 LAB — C-REACTIVE PROTEIN: CRP: 12.6 mg/dL — ABNORMAL HIGH (ref <1.0)

## 2020-07-26 MED ORDER — INSULIN ASPART 100 UNIT/ML ~~LOC~~ SOLN
0.0000 [IU] | Freq: Three times a day (TID) | SUBCUTANEOUS | Status: DC
  Administered 2020-07-26: 2 [IU] via SUBCUTANEOUS
  Administered 2020-07-26 – 2020-07-27 (×4): 3 [IU] via SUBCUTANEOUS
  Administered 2020-07-27: 5 [IU] via SUBCUTANEOUS
  Administered 2020-07-28 (×3): 3 [IU] via SUBCUTANEOUS
  Filled 2020-07-26: qty 0.09

## 2020-07-26 NOTE — ED Notes (Signed)
Daughter given update regarding patient's plan of care-patient meets ALONE criteria and daughter is aware of visitor policy regarding COVID patients and agrees to visitor guidelines-

## 2020-07-26 NOTE — ED Notes (Signed)
Daughter at bedside.

## 2020-07-26 NOTE — ED Notes (Signed)
Patient received breakfast tray 

## 2020-07-26 NOTE — ED Notes (Signed)
Pt difficult to arouse from sleep to administer medications. Patient was able to swallow pills after he was successfully awakened.

## 2020-07-26 NOTE — Progress Notes (Signed)
PROGRESS NOTE    Anthony [name]  XBJ:[id number] DOB: 05-16-1934 DOA: 07/25/2020 PCP: Josetta Huddle, MD    Brief Narrative:  86 year old gentleman with history of dementia, coronary artery disease, diabetes, Crohn's disease and rheumatoid brought to the ER from nursing home with a diagnosis of COVID-19 infection and found to be tachypneic and coughing.  In the emergency room he was initially requiring 4 L of oxygen.  CTA shows bilateral infiltrates right more than left, negative for PE.  Admitted due to symptomatology. Unvaccinated , daughter confirmed that vaccination was offered at the nursing home but family declined stating that he has too many medical issues.   Assessment & Plan:   Principal Problem:   Acute respiratory disease due to COVID-19 virus Active Problems:   Rheumatoid arthritis (HCC)   Chronic combined systolic and diastolic heart failure (HCC)   Crohn's disease of large bowel (HCC)   Benign essential HTN  Acute respiratory disease due to COVID-19 virus: Continue to monitor due to significant symptoms  chest physiotherapy, incentive spirometry, deep breathing exercises, sputum induction, mucolytic's and bronchodilators. Supplemental oxygen to keep saturations more than 90%. Covid directed therapy with , steroids, high-dose Solu-Medrol remdesivir, day 2/5 Due to severity of symptoms, patient will need daily inflammatory markers, chest x-rays, liver function test to monitor and direct COVID-19 therapies.  SpO2: 94 % O2 Flow Rate (L/min): 4 L/min  COVID-19 Labs  Recent Labs    07/25/20 2000 07/26/20 0445  DDIMER 1.98* 1.80*  FERRITIN 75  --   LDH 149  --   CRP 7.0* 12.6*    Lab Results  Component Value Date   SARSCOV2NAA POSITIVE (A) 07/25/2020   [place] NEGATIVE 03/23/2020   SARSCOV2NAA NOT DETECTED 03/01/2019   History of coronary artery disease: Fairly stable now.  Continue monitoring.  On aspirin and a statin.  Type 2 diabetes: Diet  controlled.  Not on treatment at nursing home.  Covering with sliding scale insulin for steroid-induced hyperglycemia.  Dementia: Without behavioral disturbances.  Patient is multiple symptomatic treatment including Aricept, primidone, benzodiazepines as needed.  We will continue.  Hypertension: Stable.   DVT prophylaxis: enoxaparin (LOVENOX) injection 40 mg Start: 07/26/20 1000   Code Status: Full code Family Communication: Patient's daughter [name] on the phone, referred [name] is more informed about their dad's ongoing care. Disposition Plan: Status is: Inpatient  Remains inpatient appropriate because:Inpatient level of care appropriate due to severity of illness   Dispo: The patient is from: SNF              Anticipated d/c is to: SNF              Anticipated d/c date is: 3 days              Patient currently is not medically stable to d/c.         Consultants:   None  Procedures:   None  Antimicrobials:  Antibiotics Given (last 72 hours)    Date/Time Action Medication Dose Rate   07/26/20 0010 New Bag/Given   remdesivir 200 mg in sodium chloride 0.9% 250 mL IVPB 200 mg 580 mL/hr         Subjective: Patient seen and examined.  No overnight events.  Pleasantly confused demented gentleman not in any distress.  Anywhere between 0 to 4 L of oxygen.  Objective: Vitals:   07/26/20 1049 07/26/20 1138 07/26/20 1230 07/26/20 1300  BP: 112/61 110/74 122/71 (!) 123/99  Pulse: 82 87 89 93  Resp: 20 (!) 21 (!) 21 (!) 32  Temp: 98.9 F (37.2 C) 98.9 F (37.2 C)    TempSrc: Oral Oral    SpO2: 93% 92% 93% 94%    Intake/Output Summary (Last 24 hours) at 07/26/2020 1350 Last data filed at 07/26/2020 0059 Gross per 24 hour  Intake 296.07 ml  Output --  Net 296.07 ml   There were no vitals filed for this visit.  Examination:  General exam: Appears calm and comfortable  Acutely and chronically sick looking.  Not in any distress. Pleasantly confused.  Well  composed/looks tired and sleepy. Respiratory system: Clear to auscultation. Respiratory effort normal.  No added sounds. Cardiovascular system: S1 & S2 heard, RRR.  1+ bilateral pedal edema. Gastrointestinal system: Abdomen is nondistended, soft and nontender. No organomegaly or masses felt. Normal bowel sounds heard.    Data Reviewed: I have personally reviewed following labs and imaging studies  CBC: Recent Labs  Lab 07/25/20 2000 07/26/20 0445  WBC 8.7 7.4  NEUTROABS 6.7 6.9  HGB 13.9 14.3  HCT 42.3 43.0  MCV 91.4 91.1  PLT 190 630   Basic Metabolic Panel: Recent Labs  Lab 07/25/20 2000 07/26/20 0445  NA 137 138  K 3.6 3.7  CL 100 104  CO2 23 22  GLUCOSE 177* 255*  BUN 13 14  CREATININE 1.11 0.92  CALCIUM 8.3* 8.3*   GFR: CrCl cannot be calculated (Unknown ideal weight.). Liver Function Tests: Recent Labs  Lab 07/25/20 2000 07/26/20 0445  AST 21 21  ALT 17 18  ALKPHOS 63 65  BILITOT 0.6 0.8  PROT 6.2* 6.3*  ALBUMIN 3.2* 3.3*   No results for input(s): LIPASE, AMYLASE in the last 168 hours. No results for input(s): AMMONIA in the last 168 hours. Coagulation Profile: No results for input(s): INR, PROTIME in the last 168 hours. Cardiac Enzymes: No results for input(s): CKTOTAL, CKMB, CKMBINDEX, TROPONINI in the last 168 hours. BNP (last 3 results) No results for input(s): PROBNP in the last 8760 hours. HbA1C: Recent Labs    07/26/20 0445  HGBA1C 7.2*   CBG: Recent Labs  Lab 07/26/20 0819 07/26/20 1244  GLUCAP 215* 216*   Lipid Profile: Recent Labs    07/25/20 2000  TRIG 105   Thyroid Function Tests: No results for input(s): TSH, T4TOTAL, FREET4, T3FREE, THYROIDAB in the last 72 hours. Anemia Panel: Recent Labs    07/25/20 2000  FERRITIN 75   Sepsis Labs: Recent Labs  Lab 07/25/20 2000  PROCALCITON <0.10  LATICACIDVEN 2.1*    Recent Results (from the past 240 hour(s))  SARS Coronavirus 2 by RT PCR (hospital order, performed  in [facility] hospital lab) Nasopharyngeal Nasopharyngeal Swab     Status: Abnormal   Collection Time: 07/25/20  7:12 PM   Specimen: Nasopharyngeal Swab  Result Value Ref Range Status   SARS Coronavirus 2 POSITIVE (A) NEGATIVE Final    Comment: RESULT CALLED TO, READ BACK BY AND VERIFIED WITH: ILENE HODGES @ 2105 ON 07/25/20 C VARNER (NOTE) SARS-CoV-2 target nucleic acids are DETECTED  SARS-CoV-2 RNA is generally detectable in upper respiratory specimens  during the acute phase of infection.  Positive results are indicative  of the presence of the identified virus, but do not rule out bacterial infection or co-infection with other pathogens not detected by the test.  Clinical correlation with patient history and  other diagnostic information is necessary to determine patient infection status.  The expected result is negative.  Fact Sheet for  Patients:   StrictlyIdeas.no   Fact Sheet for Healthcare Providers:   BankingDealers.co.za    This test is not yet approved or cleared by the [place] FDA and  has been authorized for detection and/or diagnosis of SARS-CoV-2 by FDA under an Emergency Use Authorization (EUA).  This EUA will remain in effect (meaning thi s test can be used) for the duration of  the COVID-19 declaration under Section 564(b)(1) of the Act, 21 U.S.C. section 360-bbb-3(b)(1), unless the authorization is terminated or revoked sooner.  Performed at [facility], [place] [address] [place], [place] [address]   Blood Culture (routine x 2)     Status: None (Preliminary result)   Collection Time: 07/25/20  8:00 PM   Specimen: BLOOD RIGHT FOREARM  Result Value Ref Range Status   Specimen Description   Final    BLOOD RIGHT FOREARM Performed at [facility], [place] [address], [place], [place] [address]    Special Requests   Final    BOTTLES DRAWN AEROBIC AND ANAEROBIC Blood Culture adequate  volume Performed at [facility] [address], [place], [place] [address]    Culture   Final    NO GROWTH < 12 HOURS Performed at [facility] [address] [place], [place] [address]    Report Status PENDING  Incomplete         Radiology Studies: CT Angio Chest PE W/Cm &/Or Wo Cm  Result Date: 07/25/2020 CLINICAL DATA:  COVID positive and cough EXAM: CT ANGIOGRAPHY CHEST WITH CONTRAST TECHNIQUE: Multidetector CT imaging of the chest was performed using the standard protocol during bolus administration of intravenous contrast. Multiplanar CT image reconstructions and MIPs were obtained to evaluate the vascular anatomy. CONTRAST:  173m OMNIPAQUE IOHEXOL 350 MG/ML SOLN COMPARISON:  March 01, 2019 FINDINGS: Cardiovascular: There is a optimal opacification of the pulmonary arteries. There is no central,segmental, or subsegmental filling defects within the pulmonary arteries. There is mild cardiomegaly. No pericardial effusion or thickening. No evidence right heart strain. There is normal three-vessel brachiocephalic anatomy without proximal stenosis. Scattered aortic atherosclerosis is seen. Coronary artery calcifications are seen. Mediastinum/Nodes: No hilar, mediastinal, or axillary adenopathy. Again noted is a heterogeneously nodular left thyroid gland. The esophagus and bronchi are unremarkable. Lungs/Pleura: Streaky patchy airspace opacity seen at the periphery of both lower lungs, right greater than left. No large airspace consolidation or pleural effusion. Upper Abdomen: No acute abnormalities present in the visualized portions of the upper abdomen. Musculoskeletal: No chest wall abnormality. No acute or significant osseous findings. Review of the MIP images confirms the above findings. IMPRESSION: No central, segmental, or subsegmental pulmonary embolism. Hazy ground-glass patchy airspace opacity seen at both lung bases, right greater than left which could be due to  atelectasis and/or infectious etiology. Electronically Signed   By: BPrudencio PairM.D.   On: 07/25/2020 21:58   DG Chest Port 1 View  Result Date: 07/25/2020 CLINICAL DATA:  Cough with COVID positive. EXAM: PORTABLE CHEST 1 VIEW COMPARISON:  Mar 23, 2020 FINDINGS: Mild atelectasis is seen within the bilateral lung bases. There is no evidence of a pleural effusion or pneumothorax. The heart size and mediastinal contours are within normal limits. The visualized skeletal structures are unremarkable. IMPRESSION: Mild bibasilar atelectasis. Electronically Signed   By: TVirgina NorfolkM.D.   On: 07/25/2020 19:18        Scheduled Meds: . vitamin C  500 mg Oral Daily  . aspirin  81  mg Oral BID PC  . carvedilol  6.25 mg Oral BID WC  . docusate sodium  100 mg Oral BID  . donepezil  5 mg Oral QHS  . enoxaparin (LOVENOX) injection  40 mg Subcutaneous Q24H  . insulin aspart  0-9 Units Subcutaneous TID WC  . LORazepam  0.5 mg Oral 2 times per day  . magnesium oxide  200 mg Oral Daily  . mesalamine  2.4 g Oral BID  . methylPREDNISolone (SOLU-MEDROL) injection  60 mg Intravenous Q12H  . oxybutynin  5 mg Oral Q12H  . pantoprazole  40 mg Oral Daily  . pravastatin  40 mg Oral QHS  . primidone  50 mg Oral QHS  . vitamin A  10,000 Units Oral Daily  . vitamin B-12  500 mcg Oral QPM  . vitamin E  400 Units Oral Daily  . zinc sulfate  220 mg Oral Daily   Continuous Infusions: . remdesivir 100 mg in NS 100 mL 100 mg (07/26/20 1037)     LOS: 1 day    Time spent: 30 minutes     Barb Merino, MD [facility] Pager [phone]

## 2020-07-27 ENCOUNTER — Inpatient Hospital Stay (HOSPITAL_COMMUNITY): Payer: PPO

## 2020-07-27 LAB — C-REACTIVE PROTEIN: CRP: 10.7 mg/dL — ABNORMAL HIGH (ref <1.0)

## 2020-07-27 LAB — CBG MONITORING, ED
Glucose-Capillary: 221 mg/dL — ABNORMAL HIGH (ref 70–99)
Glucose-Capillary: 231 mg/dL — ABNORMAL HIGH (ref 70–99)
Glucose-Capillary: 264 mg/dL — ABNORMAL HIGH (ref 70–99)

## 2020-07-27 LAB — CBC WITH DIFFERENTIAL/PLATELET
Abs Immature Granulocytes: 0.08 10*3/uL — ABNORMAL HIGH (ref 0.00–0.07)
Basophils Absolute: 0 10*3/uL (ref 0.0–0.1)
Basophils Relative: 0 %
Eosinophils Absolute: 0 10*3/uL (ref 0.0–0.5)
Eosinophils Relative: 0 %
HCT: 41.2 % (ref 39.0–52.0)
Hemoglobin: 13.6 g/dL (ref 13.0–17.0)
Immature Granulocytes: 1 %
Lymphocytes Relative: 4 %
Lymphs Abs: 0.5 10*3/uL — ABNORMAL LOW (ref 0.7–4.0)
MCH: 29.8 pg (ref 26.0–34.0)
MCHC: 33 g/dL (ref 30.0–36.0)
MCV: 90.4 fL (ref 80.0–100.0)
Monocytes Absolute: 0.4 10*3/uL (ref 0.1–1.0)
Monocytes Relative: 3 %
Neutro Abs: 11.8 10*3/uL — ABNORMAL HIGH (ref 1.7–7.7)
Neutrophils Relative %: 92 %
Platelets: 208 10*3/uL (ref 150–400)
RBC: 4.56 MIL/uL (ref 4.22–5.81)
RDW: 14.5 % (ref 11.5–15.5)
WBC: 12.9 10*3/uL — ABNORMAL HIGH (ref 4.0–10.5)
nRBC: 0 % (ref 0.0–0.2)

## 2020-07-27 LAB — COMPREHENSIVE METABOLIC PANEL
ALT: 20 U/L (ref 0–44)
AST: 24 U/L (ref 15–41)
Albumin: 3.1 g/dL — ABNORMAL LOW (ref 3.5–5.0)
Alkaline Phosphatase: 59 U/L (ref 38–126)
Anion gap: 10 (ref 5–15)
BUN: 25 mg/dL — ABNORMAL HIGH (ref 8–23)
CO2: 22 mmol/L (ref 22–32)
Calcium: 8.4 mg/dL — ABNORMAL LOW (ref 8.9–10.3)
Chloride: 101 mmol/L (ref 98–111)
Creatinine, Ser: 0.89 mg/dL (ref 0.61–1.24)
GFR calc Af Amer: >60 mL/min (ref >60)
GFR calc non Af Amer: >60 mL/min (ref >60)
Glucose, Bld: 211 mg/dL — ABNORMAL HIGH (ref 70–99)
Potassium: 3.6 mmol/L (ref 3.5–5.1)
Sodium: 133 mmol/L — ABNORMAL LOW (ref 135–145)
Total Bilirubin: 0.4 mg/dL (ref 0.3–1.2)
Total Protein: 6.3 g/dL — ABNORMAL LOW (ref 6.5–8.1)

## 2020-07-27 LAB — D-DIMER, QUANTITATIVE: D-Dimer, Quant: 2.04 ug/mL-FEU — ABNORMAL HIGH (ref 0.00–0.50)

## 2020-07-27 MED ORDER — ACETAMINOPHEN 325 MG PO TABS
650.0000 mg | ORAL_TABLET | Freq: Four times a day (QID) | ORAL | Status: DC | PRN
  Administered 2020-07-27: 650 mg via ORAL
  Filled 2020-07-27: qty 2

## 2020-07-27 NOTE — Progress Notes (Signed)
PROGRESS NOTE    Anthony [name]  LFY:[id number] DOB: [birth year] DOA: 07/25/2020 PCP: Josetta Huddle, MD    Brief Narrative:  86 year old gentleman with history of dementia, coronary artery disease, diabetes, Crohn's disease and rheumatoid brought to the ER from nursing home with a diagnosis of COVID-19 infection and found to be tachypneic and coughing.  In the emergency room he was initially requiring 4 L of oxygen.  CTA shows bilateral infiltrates right more than left, negative for PE.  Admitted due to symptomatology.  Unvaccinated , daughter confirmed that vaccination was offered at the nursing home but family declined stating that he has too many medical issues.   Assessment & Plan:   Principal Problem:   Acute respiratory disease due to COVID-19 virus Active Problems:   Rheumatoid arthritis (HCC)   Chronic combined systolic and diastolic heart failure (HCC)   Crohn's disease of large bowel (HCC)   Benign essential HTN  Acute respiratory disease due to COVID-19 virus: Continue to monitor due to significant symptoms  chest physiotherapy, incentive spirometry, deep breathing exercises, sputum induction, mucolytic's and bronchodilators. Supplemental oxygen to keep saturations more than 90%. Covid directed therapy with , steroids, high-dose Solu-Medrol remdesivir, day 3/5 Due to severity of symptoms, patient will need daily inflammatory markers, chest x-rays, liver function test to monitor and direct COVID-19 therapies.  SpO2: 92 % O2 Flow Rate (L/min): 4 L/min  COVID-19 Labs  Recent Labs    07/25/20 2000 07/26/20 0445 07/27/20 0500  DDIMER 1.98* 1.80* 2.04*  FERRITIN 75  --   --   LDH 149  --   --   CRP 7.0* 12.6* 10.7*    Lab Results  Component Value Date   SARSCOV2NAA POSITIVE (A) 07/25/2020   [place] NEGATIVE 03/23/2020   [place] NOT DETECTED 03/01/2019   History of coronary artery disease: Fairly stable now.  Continue monitoring.  On aspirin and a  statin.  Type 2 diabetes: Diet controlled.  Not on treatment at nursing home.  Covering with sliding scale insulin for steroid-induced hyperglycemia.  Dementia: Without behavioral disturbances.  Patient is multiple symptomatic treatment including Aricept, primidone, benzodiazepines as needed.  We will continue.  Hypertension: Stable.  Fall: No skeletal injury.  High fall risk.   DVT prophylaxis: enoxaparin (LOVENOX) injection 40 mg Start: 07/26/20 1000   Code Status: Full code Family Communication: Patient's daughter [name] at the bedside.  Discussed patient's clinical status as well as discharge planning. Patient's daughter [name] states that patient had COVID-19 infection in November 2019 and he has strong antibody since then. Disposition Plan: Status is: Inpatient  Remains inpatient appropriate because:Inpatient level of care appropriate due to severity of illness   Dispo: The patient is from: SNF              Anticipated d/c is to: SNF              Anticipated d/c date is: 2 days.              Patient currently is not medically stable to d/c.    consultants:   None  Procedures:   None  Antimicrobials:  Antibiotics Given (last 72 hours)    Date/Time Action Medication Dose Rate   07/26/20 0010 New Bag/Given   remdesivir 200 mg in sodium chloride 0.9% 250 mL IVPB 200 mg 580 mL/hr   07/27/20 1007 New Bag/Given   remdesivir 100 mg in sodium chloride 0.9 % 100 mL IVPB 100 mg 200 mL/hr  Subjective: Patient seen and examined.  No overnight events.  Early morning hours, he was put on commode and found on the floor.  No skeletal injury.  Daughter at the bedside. Patient himself is mostly on room air.  Pleasantly confused and poor historian.  Objective: Vitals:   07/27/20 0907 07/27/20 1059 07/27/20 1100 07/27/20 1230  BP: (!) 154/101 (!) 154/87 (!) 154/87 111/86  Pulse: 80 88 91 75  Resp: (!) 23 (!) 23 18 (!) 21  Temp: 98.4 F (36.9 C)     TempSrc: Oral       SpO2: 96% 98% 96% 92%  Weight: 81.6 kg     Height: 6' 1"  (1.854 m)      No intake or output data in the 24 hours ending 07/27/20 1403 Filed Weights   07/27/20 0907  Weight: 81.6 kg    Examination:  General exam: Appears calm and comfortable  Acutely and chronically sick looking.  Not in any distress. Alert and awake but not oriented. Respiratory system: Clear to auscultation. Respiratory effort normal.  No added sounds. Cardiovascular system: S1 & S2 heard, RRR.  1+ bilateral pedal edema. Gastrointestinal system: Abdomen is nondistended, soft and nontender. No organomegaly or masses felt. Normal bowel sounds heard.    Data Reviewed: I have personally reviewed following labs and imaging studies  CBC: Recent Labs  Lab 07/25/20 2000 07/26/20 0445 07/27/20 0500  WBC 8.7 7.4 12.9*  NEUTROABS 6.7 6.9 11.8*  HGB 13.9 14.3 13.6  HCT 42.3 43.0 41.2  MCV 91.4 91.1 90.4  PLT 190 178 222   Basic Metabolic Panel: Recent Labs  Lab 07/25/20 2000 07/26/20 0445 07/27/20 0500  NA 137 138 133*  K 3.6 3.7 3.6  CL 100 104 101  CO2 23 22 22   GLUCOSE 177* 255* 211*  BUN 13 14 25*  CREATININE 1.11 0.92 0.89  CALCIUM 8.3* 8.3* 8.4*   GFR: Estimated Creatinine Clearance: 69.8 mL/min (by C-G formula based on SCr of 0.89 mg/dL). Liver Function Tests: Recent Labs  Lab 07/25/20 2000 07/26/20 0445 07/27/20 0500  AST 21 21 24   ALT 17 18 20   ALKPHOS 63 65 59  BILITOT 0.6 0.8 0.4  PROT 6.2* 6.3* 6.3*  ALBUMIN 3.2* 3.3* 3.1*   No results for input(s): LIPASE, AMYLASE in the last 168 hours. No results for input(s): AMMONIA in the last 168 hours. Coagulation Profile: No results for input(s): INR, PROTIME in the last 168 hours. Cardiac Enzymes: No results for input(s): CKTOTAL, CKMB, CKMBINDEX, TROPONINI in the last 168 hours. BNP (last 3 results) No results for input(s): PROBNP in the last 8760 hours. HbA1C: Recent Labs    07/26/20 0445  HGBA1C 7.2*   CBG: Recent Labs   Lab 07/26/20 1244 07/26/20 1739 07/26/20 2208 07/27/20 0835 07/27/20 1145  GLUCAP 216* 195* 165* 221* 231*   Lipid Profile: Recent Labs    07/25/20 2000  TRIG 105   Thyroid Function Tests: No results for input(s): TSH, T4TOTAL, FREET4, T3FREE, THYROIDAB in the last 72 hours. Anemia Panel: Recent Labs    07/25/20 2000  FERRITIN 75   Sepsis Labs: Recent Labs  Lab 07/25/20 2000  PROCALCITON <0.10  LATICACIDVEN 2.1*    Recent Results (from the past 240 hour(s))  SARS Coronavirus 2 by RT PCR (hospital order, performed in [facility] hospital lab) Nasopharyngeal Nasopharyngeal Swab     Status: Abnormal   Collection Time: 07/25/20  7:12 PM   Specimen: Nasopharyngeal Swab  Result Value Ref Range Status  SARS Coronavirus 2 POSITIVE (A) NEGATIVE Final    Comment: RESULT CALLED TO, READ BACK BY AND VERIFIED WITH: ILENE HODGES @ 2105 ON 07/25/20 C VARNER (NOTE) SARS-CoV-2 target nucleic acids are DETECTED  SARS-CoV-2 RNA is generally detectable in upper respiratory specimens  during the acute phase of infection.  Positive results are indicative  of the presence of the identified virus, but do not rule out bacterial infection or co-infection with other pathogens not detected by the test.  Clinical correlation with patient history and  other diagnostic information is necessary to determine patient infection status.  The expected result is negative.  Fact Sheet for Patients:   StrictlyIdeas.no   Fact Sheet for Healthcare Providers:   BankingDealers.co.za    This test is not yet approved or cleared by the [place] FDA and  has been authorized for detection and/or diagnosis of SARS-CoV-2 by FDA under an Emergency Use Authorization (EUA).  This EUA will remain in effect (meaning thi s test can be used) for the duration of  the COVID-19 declaration under Section 564(b)(1) of the Act, 21 U.S.C. section 360-bbb-3(b)(1), unless  the authorization is terminated or revoked sooner.  Performed at [facility], [place] [address], [place], [place] [address]   Blood Culture (routine x 2)     Status: None (Preliminary result)   Collection Time: 07/25/20  8:00 PM   Specimen: BLOOD RIGHT FOREARM  Result Value Ref Range Status   Specimen Description   Final    BLOOD RIGHT FOREARM Performed at [facility], [place] [address] [place], [place] [address]    Special Requests   Final    BOTTLES DRAWN AEROBIC AND ANAEROBIC Blood Culture adequate volume Performed at [facility] [address] [place], [place] [address]    Culture   Final    NO GROWTH < 12 HOURS Performed at [facility] [address] [place],  [address]    Report Status PENDING  Incomplete         Radiology Studies: CT Angio Chest PE W/Cm &/Or Wo Cm  Result Date: 07/25/2020 CLINICAL DATA:  COVID positive and cough EXAM: CT ANGIOGRAPHY CHEST WITH CONTRAST TECHNIQUE: Multidetector CT imaging of the chest was performed using the standard protocol during bolus administration of intravenous contrast. Multiplanar CT image reconstructions and MIPs were obtained to evaluate the vascular anatomy. CONTRAST:  165m OMNIPAQUE IOHEXOL 350 MG/ML SOLN COMPARISON:  March 01, 2019 FINDINGS: Cardiovascular: There is a optimal opacification of the pulmonary arteries. There is no central,segmental, or subsegmental filling defects within the pulmonary arteries. There is mild cardiomegaly. No pericardial effusion or thickening. No evidence right heart strain. There is normal three-vessel brachiocephalic anatomy without proximal stenosis. Scattered aortic atherosclerosis is seen. Coronary artery calcifications are seen. Mediastinum/Nodes: No hilar, mediastinal, or axillary adenopathy. Again noted is a heterogeneously nodular left thyroid gland. The esophagus and bronchi are unremarkable. Lungs/Pleura: Streaky patchy airspace  opacity seen at the periphery of both lower lungs, right greater than left. No large airspace consolidation or pleural effusion. Upper Abdomen: No acute abnormalities present in the visualized portions of the upper abdomen. Musculoskeletal: No chest wall abnormality. No acute or significant osseous findings. Review of the MIP images confirms the above findings. IMPRESSION: No central, segmental, or subsegmental pulmonary embolism. Hazy ground-glass patchy airspace opacity seen at both lung bases, right greater than left which could be due to atelectasis and/or infectious etiology. Electronically Signed   By: BPrudencio Pair  M.D.   On: 07/25/2020 21:58   DG Chest Port 1 View  Result Date: 07/25/2020 CLINICAL DATA:  Cough with COVID positive. EXAM: PORTABLE CHEST 1 VIEW COMPARISON:  Mar 23, 2020 FINDINGS: Mild atelectasis is seen within the bilateral lung bases. There is no evidence of a pleural effusion or pneumothorax. The heart size and mediastinal contours are within normal limits. The visualized skeletal structures are unremarkable. IMPRESSION: Mild bibasilar atelectasis. Electronically Signed   By: Virgina Norfolk M.D.   On: 07/25/2020 19:18        Scheduled Meds: . vitamin C  500 mg Oral Daily  . aspirin  81 mg Oral BID PC  . carvedilol  6.25 mg Oral BID WC  . docusate sodium  100 mg Oral BID  . donepezil  5 mg Oral QHS  . enoxaparin (LOVENOX) injection  40 mg Subcutaneous Q24H  . insulin aspart  0-9 Units Subcutaneous TID WC  . LORazepam  0.5 mg Oral 2 times per day  . magnesium oxide  200 mg Oral Daily  . mesalamine  2.4 g Oral BID  . methylPREDNISolone (SOLU-MEDROL) injection  60 mg Intravenous Q12H  . oxybutynin  5 mg Oral Q12H  . pantoprazole  40 mg Oral Daily  . pravastatin  40 mg Oral QHS  . primidone  50 mg Oral QHS  . vitamin A  10,000 Units Oral Daily  . vitamin B-12  500 mcg Oral QPM  . vitamin E  400 Units Oral Daily  . zinc sulfate  220 mg Oral Daily   Continuous  Infusions: . remdesivir 100 mg in NS 100 mL Stopped (07/27/20 1212)     LOS: 2 days    Time spent: 30 minutes     Barb Merino, MD [facility] Pager [phone]

## 2020-07-27 NOTE — ED Notes (Signed)
Per pts daughter, he has been complaining of neck pain since his fall this am, attending Dr made aware and imaging ordered

## 2020-07-27 NOTE — ED Notes (Signed)
Patient has yellow socks on, two fall risk signs on door, and yellow falls risk band on.

## 2020-07-27 NOTE — ED Notes (Signed)
Patient was found on floor by other staff members. Staff members helped patient back into bed. No injuries noted. VS taken. MD made aware.

## 2020-07-27 NOTE — ED Notes (Signed)
Patients daughter [name] called to make aware that this patient is a high falls risk pt and should not be left alone on the bedside commode.

## 2020-07-27 NOTE — ED Notes (Signed)
Patient placed on bedside commode. Call bell in reach, patient advised to use call bell once he was done. Pt verbalized understanding.

## 2020-07-27 NOTE — ED Notes (Signed)
Purple DNR sticker placed on patients armband at this time.

## 2020-07-28 LAB — COMPREHENSIVE METABOLIC PANEL
ALT: 21 U/L (ref 0–44)
AST: 29 U/L (ref 15–41)
Albumin: 3.2 g/dL — ABNORMAL LOW (ref 3.5–5.0)
Alkaline Phosphatase: 58 U/L (ref 38–126)
Anion gap: 11 (ref 5–15)
BUN: 25 mg/dL — ABNORMAL HIGH (ref 8–23)
CO2: 21 mmol/L — ABNORMAL LOW (ref 22–32)
Calcium: 8.4 mg/dL — ABNORMAL LOW (ref 8.9–10.3)
Chloride: 102 mmol/L (ref 98–111)
Creatinine, Ser: 0.85 mg/dL (ref 0.61–1.24)
GFR calc Af Amer: >60 mL/min (ref >60)
GFR calc non Af Amer: >60 mL/min (ref >60)
Glucose, Bld: 210 mg/dL — ABNORMAL HIGH (ref 70–99)
Potassium: 4 mmol/L (ref 3.5–5.1)
Sodium: 134 mmol/L — ABNORMAL LOW (ref 135–145)
Total Bilirubin: 0.5 mg/dL (ref 0.3–1.2)
Total Protein: 6.4 g/dL — ABNORMAL LOW (ref 6.5–8.1)

## 2020-07-28 LAB — CBG MONITORING, ED
Glucose-Capillary: 165 mg/dL — ABNORMAL HIGH (ref 70–99)
Glucose-Capillary: 217 mg/dL — ABNORMAL HIGH (ref 70–99)
Glucose-Capillary: 203 mg/dL — ABNORMAL HIGH (ref 70–99)
Glucose-Capillary: 246 mg/dL — ABNORMAL HIGH (ref 70–99)
Glucose-Capillary: 268 mg/dL — ABNORMAL HIGH (ref 70–99)

## 2020-07-28 LAB — CBC WITH DIFFERENTIAL/PLATELET
Abs Immature Granulocytes: 0.13 10*3/uL — ABNORMAL HIGH (ref 0.00–0.07)
Basophils Absolute: 0 10*3/uL (ref 0.0–0.1)
Basophils Relative: 0 %
Eosinophils Absolute: 0 10*3/uL (ref 0.0–0.5)
Eosinophils Relative: 0 %
HCT: 42.9 % (ref 39.0–52.0)
Hemoglobin: 14.3 g/dL (ref 13.0–17.0)
Immature Granulocytes: 1 %
Lymphocytes Relative: 2 %
Lymphs Abs: 0.4 10*3/uL — ABNORMAL LOW (ref 0.7–4.0)
MCH: 30.1 pg (ref 26.0–34.0)
MCHC: 33.3 g/dL (ref 30.0–36.0)
MCV: 90.3 fL (ref 80.0–100.0)
Monocytes Absolute: 0.6 10*3/uL (ref 0.1–1.0)
Monocytes Relative: 3 %
Neutro Abs: 16.3 10*3/uL — ABNORMAL HIGH (ref 1.7–7.7)
Neutrophils Relative %: 94 %
Platelets: 210 10*3/uL (ref 150–400)
RBC: 4.75 MIL/uL (ref 4.22–5.81)
RDW: 14.5 % (ref 11.5–15.5)
WBC: 17.4 10*3/uL — ABNORMAL HIGH (ref 4.0–10.5)
nRBC: 0 % (ref 0.0–0.2)

## 2020-07-28 LAB — D-DIMER, QUANTITATIVE: D-Dimer, Quant: 3.29 ug/mL-FEU — ABNORMAL HIGH (ref 0.00–0.50)

## 2020-07-28 LAB — C-REACTIVE PROTEIN: CRP: 5.2 mg/dL — ABNORMAL HIGH (ref <1.0)

## 2020-07-28 MED ORDER — METHYLPREDNISOLONE SODIUM SUCC 40 MG IJ SOLR: 40.0000 mg | Freq: Every day | INTRAMUSCULAR | Status: DC

## 2020-07-28 NOTE — ED Notes (Signed)
Pt had turned sideways in the bed and had removed all clothes and pure fit. Bed and pt cleaned and pure fit replaced. Bed alarm is in place and on.

## 2020-07-28 NOTE — ED Notes (Signed)
Pt repositioned in bed. Incontinence pad changed. Pillows placed under pts heels. Condom cath in place. Daughter remains at bedside. Updated on plan of care/

## 2020-07-28 NOTE — Progress Notes (Addendum)
CSW spoke with Network engineer at [facility] who states there are no admissions staff available this weekend. Secretary states there is a COVID unit at the facility but she is not able to state whether there are beds available.   Madilyn Fireman, MSW, LCSW-A Transitions of Care  Clinical Social Worker  [facility] Emergency Departments  Medical ICU [phone]

## 2020-07-28 NOTE — Progress Notes (Signed)
PROGRESS NOTE    Anthony [name]  UQJ:[id number] DOB: [birth year] DOA: 07/25/2020 PCP: Josetta Huddle, MD    Brief Narrative:  86 year old gentleman with history of dementia, coronary artery disease, diabetes, Crohn's disease and rheumatoid brought to the ER from nursing home with a diagnosis of COVID-19 infection and found to be tachypneic and coughing.  In the emergency room he was initially requiring 4 L of oxygen.  CTA shows bilateral infiltrates right more than left, negative for PE.  Admitted due to symptomatology.  Unvaccinated , daughter confirmed that vaccination was offered at the nursing home but family declined stating that he has too many medical issues.   Assessment & Plan:   Principal Problem:   Acute respiratory disease due to COVID-19 virus Active Problems:   Rheumatoid arthritis (HCC)   Chronic combined systolic and diastolic heart failure (HCC)   Crohn's disease of large bowel (HCC)   Benign essential HTN  Acute respiratory disease due to COVID-19 virus: Continue to monitor due to significant symptoms  chest physiotherapy, incentive spirometry, deep breathing exercises, sputum induction, mucolytic's and bronchodilators. Supplemental oxygen to keep saturations more than 90%. Covid directed therapy with , steroids, high-dose Solu-Medrol remdesivir, day 4/5 Due to severity of symptoms, patient will need daily inflammatory markers, chest x-rays, liver function test to monitor and direct COVID-19 therapies.  SpO2: 95 % O2 Flow Rate (L/min): 4 L/min  COVID-19 Labs  Recent Labs    07/25/20 2000 07/25/20 2000 07/26/20 0445 07/27/20 0500 07/28/20 0456  DDIMER 1.98*   < > 1.80* 2.04* 3.29*  FERRITIN 75  --   --   --   --   LDH 149  --   --   --   --   CRP 7.0*   < > 12.6* 10.7* 5.2*   < > = values in this interval not displayed.    Lab Results  Component Value Date   [place] (A) 07/25/2020   [place] NEGATIVE 03/23/2020   [place] NOT  DETECTED 03/01/2019   History of coronary artery disease: Fairly stable now.  Continue monitoring.  On aspirin and a statin.  Type 2 diabetes: Diet controlled.  Not on treatment at nursing home.  Covering with sliding scale insulin for steroid-induced hyperglycemia.  Dementia: Without behavioral disturbances.  Patient is multiple symptomatic treatment including Aricept, primidone, benzodiazepines as needed.  We will continue.  Hypertension: Stable.  Fall: No skeletal injury.  High fall risk.   DVT prophylaxis: enoxaparin (LOVENOX) injection 40 mg Start: 07/26/20 1000   Code Status: Full code Family Communication: Patient's daughter [name] at the bedside.  Patient's daughter [name] states that patient had COVID-19 infection in November 2019 and he has strong antibody since then. Disposition Plan: Status is: Inpatient  Remains inpatient appropriate because:Inpatient level of care appropriate due to severity of illness   Dispo: The patient is from: SNF              Anticipated d/c is to: SNF              Anticipated d/c date is: Architectural technologist.              Patient currently is not medically stable to d/c.  If patient does fairly stable tomorrow, he can be transferred back to his long-term nursing home.  consultants:   None  Procedures:   None  Antimicrobials:  Antibiotics Given (last 72 hours)    Date/Time Action Medication Dose Rate   07/26/20 0010 New Bag/Given  remdesivir 200 mg in sodium chloride 0.9% 250 mL IVPB 200 mg 580 mL/hr   07/27/20 1007 New Bag/Given   remdesivir 100 mg in sodium chloride 0.9 % 100 mL IVPB 100 mg 200 mL/hr         Subjective: Seen and examined.  No overnight events.  Pleasantly confused.  On room air most of the time.  Wants to go home.  Objective: Vitals:   07/28/20 0412 07/28/20 0702 07/28/20 0800 07/28/20 1100  BP: 124/86 124/78 134/80 124/72  Pulse: 100 93 84 75  Resp: 18 18 18 16   Temp:      TempSrc:      SpO2: 93% 90% 93% 95%    Weight:      Height:       No intake or output data in the 24 hours ending 07/28/20 1217 Filed Weights   07/27/20 0907  Weight: 81.6 kg    Examination:  General exam: Appears calm and comfortable  Acutely and chronically sick looking.  Not in any distress. Alert and awake but not oriented. Respiratory system: Clear to auscultation. Respiratory effort normal.  No added sounds. Cardiovascular system: S1 & S2 heard, RRR.  1+ bilateral pedal edema. Gastrointestinal system: Abdomen is nondistended, soft and nontender. No organomegaly or masses felt. Normal bowel sounds heard.    Data Reviewed: I have personally reviewed following labs and imaging studies  CBC: Recent Labs  Lab 07/25/20 2000 07/26/20 0445 07/27/20 0500 07/28/20 0456  WBC 8.7 7.4 12.9* 17.4*  NEUTROABS 6.7 6.9 11.8* 16.3*  HGB 13.9 14.3 13.6 14.3  HCT 42.3 43.0 41.2 42.9  MCV 91.4 91.1 90.4 90.3  PLT 190 178 208 888   Basic Metabolic Panel: Recent Labs  Lab 07/25/20 2000 07/26/20 0445 07/27/20 0500 07/28/20 0456  NA 137 138 133* 134*  K 3.6 3.7 3.6 4.0  CL 100 104 101 102  CO2 23 22 22  21*  GLUCOSE 177* 255* 211* 210*  BUN 13 14 25* 25*  CREATININE 1.11 0.92 0.89 0.85  CALCIUM 8.3* 8.3* 8.4* 8.4*   GFR: Estimated Creatinine Clearance: 73.1 mL/min (by C-G formula based on SCr of 0.85 mg/dL). Liver Function Tests: Recent Labs  Lab 07/25/20 2000 07/26/20 0445 07/27/20 0500 07/28/20 0456  AST 21 21 24 29   ALT 17 18 20 21   ALKPHOS 63 65 59 58  BILITOT 0.6 0.8 0.4 0.5  PROT 6.2* 6.3* 6.3* 6.4*  ALBUMIN 3.2* 3.3* 3.1* 3.2*   No results for input(s): LIPASE, AMYLASE in the last 168 hours. No results for input(s): AMMONIA in the last 168 hours. Coagulation Profile: No results for input(s): INR, PROTIME in the last 168 hours. Cardiac Enzymes: No results for input(s): CKTOTAL, CKMB, CKMBINDEX, TROPONINI in the last 168 hours. BNP (last 3 results) No results for input(s): PROBNP in the last  8760 hours. HbA1C: Recent Labs    07/26/20 0445  HGBA1C 7.2*   CBG: Recent Labs  Lab 07/27/20 1145 07/27/20 1635 07/27/20 2343 07/28/20 0839 07/28/20 1154  GLUCAP 231* 264* 165* 217* 203*   Lipid Profile: Recent Labs    07/25/20 2000  TRIG 105   Thyroid Function Tests: No results for input(s): TSH, T4TOTAL, FREET4, T3FREE, THYROIDAB in the last 72 hours. Anemia Panel: Recent Labs    07/25/20 2000  FERRITIN 75   Sepsis Labs: Recent Labs  Lab 07/25/20 2000  PROCALCITON <0.10  LATICACIDVEN 2.1*    Recent Results (from the past 240 hour(s))  SARS Coronavirus 2 by RT  PCR (hospital order, performed in [facility] hospital lab) Nasopharyngeal Nasopharyngeal Swab     Status: Abnormal   Collection Time: 07/25/20  7:12 PM   Specimen: Nasopharyngeal Swab  Result Value Ref Range Status   SARS Coronavirus 2 POSITIVE (A) NEGATIVE Final    Comment: RESULT CALLED TO, READ BACK BY AND VERIFIED WITH: ILENE HODGES @ 2105 ON 07/25/20 C VARNER (NOTE) SARS-CoV-2 target nucleic acids are DETECTED  SARS-CoV-2 RNA is generally detectable in upper respiratory specimens  during the acute phase of infection.  Positive results are indicative  of the presence of the identified virus, but do not rule out bacterial infection or co-infection with other pathogens not detected by the test.  Clinical correlation with patient history and  other diagnostic information is necessary to determine patient infection status.  The expected result is negative.  Fact Sheet for Patients:   StrictlyIdeas.no   Fact Sheet for Healthcare Providers:   BankingDealers.co.za    This test is not yet approved or cleared by the [place] FDA and  has been authorized for detection and/or diagnosis of SARS-CoV-2 by FDA under an Emergency Use Authorization (EUA).  This EUA will remain in effect (meaning thi s test can be used) for the duration of  the COVID-19  declaration under Section 564(b)(1) of the Act, 21 U.S.C. section 360-bbb-3(b)(1), unless the authorization is terminated or revoked sooner.  Performed at [facility], [place] [address], [place], [place] [address]   Blood Culture (routine x 2)     Status: None (Preliminary result)   Collection Time: 07/25/20  8:00 PM   Specimen: BLOOD RIGHT FOREARM  Result Value Ref Range Status   Specimen Description   Final    BLOOD RIGHT FOREARM Performed at [facility], [place] [address] [place], [place] [address]    Special Requests   Final    BOTTLES DRAWN AEROBIC AND ANAEROBIC Blood Culture adequate volume Performed at [place] [address] [place], [place] [address]    Culture   Final    NO GROWTH 3 DAYS Performed at [facility], [place] [address], [place],  [address]    Report Status PENDING  Incomplete         Radiology Studies: DG Cervical Spine 2 or 3 views  Result Date: 07/27/2020 CLINICAL DATA:  Found on floor.  Neck pain EXAM: CERVICAL SPINE - 2-3 VIEW COMPARISON:  None. FINDINGS: Normal alignment. Diffuse degenerative disc and advanced degenerative facet disease. No visible fracture or subluxation. Prevertebral soft tissues are normal. IMPRESSION: Diffuse advanced degenerative changes.  No acute bony abnormality. Electronically Signed   By: Rolm Baptise M.D.   On: 07/27/2020 21:24        Scheduled Meds: . vitamin C  500 mg Oral Daily  . aspirin  81 mg Oral BID PC  . carvedilol  6.25 mg Oral BID WC  . docusate sodium  100 mg Oral BID  . donepezil  5 mg Oral QHS  . enoxaparin (LOVENOX) injection  40 mg Subcutaneous Q24H  . insulin aspart  0-9 Units Subcutaneous TID WC  . LORazepam  0.5 mg Oral 2 times per day  . magnesium oxide  200 mg Oral Daily  . mesalamine  2.4 g Oral BID  . [START ON 07/29/2020] methylPREDNISolone (SOLU-MEDROL) injection  40 mg Intravenous Daily  . oxybutynin  5 mg Oral Q12H  .  pantoprazole  40 mg Oral Daily  . pravastatin  40 mg Oral QHS  . primidone  50 mg Oral QHS  . vitamin A  10,000 Units Oral Daily  . vitamin B-12  500 mcg Oral QPM  . vitamin E  400 Units Oral Daily  . zinc sulfate  220 mg Oral Daily   Continuous Infusions: . remdesivir 100 mg in NS 100 mL 100 mg (07/28/20 1039)     LOS: 3 days    Time spent: 30 minutes     Barb Merino, MD [facility] Pager [phone]

## 2020-07-28 NOTE — ED Notes (Signed)
Pt placed on hospital bed at this time.

## 2020-07-29 LAB — CBC WITH DIFFERENTIAL/PLATELET
Abs Immature Granulocytes: 0.22 10*3/uL — ABNORMAL HIGH (ref 0.00–0.07)
Basophils Absolute: 0 10*3/uL (ref 0.0–0.1)
Basophils Relative: 0 %
Eosinophils Absolute: 0 10*3/uL (ref 0.0–0.5)
Eosinophils Relative: 0 %
HCT: 41.3 % (ref 39.0–52.0)
Hemoglobin: 13.5 g/dL (ref 13.0–17.0)
Immature Granulocytes: 1 %
Lymphocytes Relative: 4 %
Lymphs Abs: 0.7 10*3/uL (ref 0.7–4.0)
MCH: 29.7 pg (ref 26.0–34.0)
MCHC: 32.7 g/dL (ref 30.0–36.0)
MCV: 91 fL (ref 80.0–100.0)
Monocytes Absolute: 1 10*3/uL (ref 0.1–1.0)
Monocytes Relative: 6 %
Neutro Abs: 15.2 10*3/uL — ABNORMAL HIGH (ref 1.7–7.7)
Neutrophils Relative %: 89 %
Platelets: 210 10*3/uL (ref 150–400)
RBC: 4.54 MIL/uL (ref 4.22–5.81)
RDW: 14.6 % (ref 11.5–15.5)
WBC: 17.1 10*3/uL — ABNORMAL HIGH (ref 4.0–10.5)
nRBC: 0 % (ref 0.0–0.2)

## 2020-07-29 LAB — D-DIMER, QUANTITATIVE: D-Dimer, Quant: 1.85 ug/mL-FEU — ABNORMAL HIGH (ref 0.00–0.50)

## 2020-07-29 LAB — COMPREHENSIVE METABOLIC PANEL
ALT: 20 U/L (ref 0–44)
AST: 26 U/L (ref 15–41)
Albumin: 3.1 g/dL — ABNORMAL LOW (ref 3.5–5.0)
Alkaline Phosphatase: 56 U/L (ref 38–126)
Anion gap: 12 (ref 5–15)
BUN: 32 mg/dL — ABNORMAL HIGH (ref 8–23)
CO2: 23 mmol/L (ref 22–32)
Calcium: 8.6 mg/dL — ABNORMAL LOW (ref 8.9–10.3)
Chloride: 101 mmol/L (ref 98–111)
Creatinine, Ser: 1.01 mg/dL (ref 0.61–1.24)
GFR calc Af Amer: >60 mL/min (ref >60)
GFR calc non Af Amer: >60 mL/min (ref >60)
Glucose, Bld: 166 mg/dL — ABNORMAL HIGH (ref 70–99)
Potassium: 4.2 mmol/L (ref 3.5–5.1)
Sodium: 136 mmol/L (ref 135–145)
Total Bilirubin: 0.6 mg/dL (ref 0.3–1.2)
Total Protein: 6 g/dL — ABNORMAL LOW (ref 6.5–8.1)

## 2020-07-29 LAB — C-REACTIVE PROTEIN: CRP: 2.6 mg/dL — ABNORMAL HIGH (ref <1.0)

## 2020-07-29 LAB — GLUCOSE, CAPILLARY
Glucose-Capillary: 160 mg/dL — ABNORMAL HIGH (ref 70–99)
Glucose-Capillary: 147 mg/dL — ABNORMAL HIGH (ref 70–99)

## 2020-07-29 MED ORDER — LORAZEPAM 0.5 MG PO TABS: 0.5000 mg | ORAL_TABLET | Freq: Two times a day (BID) | ORAL | 0 refills | Status: AC

## 2020-07-29 MED ORDER — HYDROCODONE-ACETAMINOPHEN 5-325 MG PO TABS
2.0000 | ORAL_TABLET | ORAL | 0 refills | Status: AC | PRN
Start: 2020-07-29 — End: 2020-08-03

## 2020-07-29 MED ORDER — DEXAMETHASONE 6 MG PO TABS: 6.0000 mg | ORAL_TABLET | Freq: Every day | ORAL | 0 refills | Status: AC

## 2020-07-29 NOTE — Progress Notes (Signed)
07/29/2020  1137  Report called to [facility] [phone] Report given to [facility].

## 2020-07-29 NOTE — Plan of Care (Signed)
  Problem: Safety: Goal: Ability to remain free from injury will improve Outcome: Adequate for Discharge   Problem: Education: Goal: Knowledge of General Education information will improve Description: Including pain rating scale, medication(s)/side effects and non-pharmacologic comfort measures Outcome: Adequate for Discharge   Problem: Pain Managment: Goal: General experience of comfort will improve Outcome: Adequate for Discharge

## 2020-07-29 NOTE — ED Notes (Signed)
Patient brief changed and patient repositioned in bed

## 2020-07-29 NOTE — TOC Transition Note (Signed)
Transition of Care [facility]) - CM/SW Discharge Note   Patient Details  Name: Anthony [name] MRN: [id number] Date of Birth: December 01, 1933  Transition of Care [facility]) CM/SW Contact:  Lennart Pall, LCSW Phone Number: 07/29/2020, 11:14 AM   Clinical Narrative:  Pt medically cleared for transfer back to [facility] where he has been a long term resident.  Daughter, [name], notified and in agreement.  PTAR en route.     Final next level of care: Skilled Nursing Facility Barriers to Discharge: Barriers Resolved   Patient Goals and CMS Choice     Choice offered to / list presented to : NA  Discharge Placement   Existing PASRR number confirmed : 07/29/20          Patient chooses bed at: [facility] Patient to be transferred to facility by: [place] Name of family member notified: Daughter, [place] Patient and family notified of of transfer: 07/29/20  Discharge Plan and Services                DME Arranged: N/A DME Agency: NA       HH Arranged: NA [place] Agency: NA        Social Determinants of Health ([place]) Interventions     Readmission Risk Interventions Readmission Risk Prevention Plan 03/25/2020 03/06/2019  Transportation Screening Complete Complete  PCP or Specialist Appt within 3-5 Days Complete -  HRI or Home Care Consult Complete -  Social Work Consult for [place] Planning/Counseling Complete -  [place] Screening Not Applicable -  Medication Review Press photographer) Complete Complete  PCP or Specialist appointment within 3-5 days of discharge - Complete  HRI or [place] - Complete  SW Recovery Care/Counseling Consult - Complete  [place] - Complete  Some recent data might be hidden

## 2020-07-29 NOTE — NC FL2 (Signed)
[place] LEVEL OF CARE SCREENING TOOL     IDENTIFICATION  Patient Name: Anthony [name] Birthdate: 1934/10/23 Sex: male Admission Date (Current Location): 07/25/2020  [facility] [place] Number:  Herbalist and Address:  [facility],  [place] [address] [place]      Provider Number: [id number]  Attending Physician Name and Address:  Barb Merino, MD  Relative Name and Phone Number:       Current Level of Care: Hospital Recommended Level of Care: [place] Prior Approval Number:    Date Approved/Denied:   PASRR Number: [id number]  Discharge Plan: SNF    Current Diagnoses: Patient Active Problem List   Diagnosis Date Noted  . Acute respiratory disease due to COVID-19 virus 07/25/2020  . Hypokalemia 03/23/2020  . S/P hip hemiarthroplasty 04/19/2019  . Pressure injury of skin 03/05/2019  . Suspected COVID-19 virus infection 03/01/2019  . Acute respiratory failure with hypoxia ([place]) 03/01/2019  . Hip fracture ([place]) 03/01/2019  . Fall 03/01/2019  . Displaced fracture of base of neck of left femur, initial encounter for closed fracture ([place])   . Fever   . Nonspecific interstitial pneumonia ([place])   . Febrile illness   . Thrush   . Hypoxia   . Sepsis ([place]) 10/04/2018  . Fatty liver 04/06/2018  . Acute pancreatitis 04/04/2018  . Abdominal pain, right lower quadrant 05/28/2015  . Absolute anemia 05/28/2015  . Aortic atherosclerosis ([place]) 05/28/2015  . Altered blood in stool 05/28/2015  . Chronic systolic heart failure ([place]) 05/28/2015  . Syncope and collapse 05/28/2015  . Arteriosclerosis of coronary artery 05/28/2015  . Cough 05/28/2015  . Crohn's disease of large bowel ([place]) 05/28/2015  . Diarrhea 05/28/2015  . Displacement of cervical intervertebral disc without myelopathy 05/28/2015  . Benign essential HTN 05/28/2015  . Adenopathy 05/28/2015  . Hypercholesterolemia without hypertriglyceridemia  05/28/2015  . Hypo-osmolality and hyponatremia 05/28/2015  . Postinflammatory pulmonary fibrosis ([place]) 05/28/2015  . Healed myocardial infarct 05/28/2015  . Peptic esophagitis 05/28/2015  . 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 Paroxysmal ventricular tachycardia ([place]) 05/28/2015  . Decreased body weight 05/28/2015  . Weakness 11/05/2014  . Nausea with vomiting 11/05/2014  . Generalized weakness   . Dehydration   . Difficulty walking   . Pulmonary infiltrates 07/16/2014  . Chronic combined systolic and diastolic heart failure ([place]) 05/31/2014  . Essential hypertension 05/31/2014  .  H/O Right inguinal hernia repair 01/15/2012  . Small fat containing right inguinal hernia that has been painful 08/05/2011  . Finger wound, simple, open 10/12/2008  . Benign prostatic hyperplasia without urinary obstruction 04/30/2008  . Synovitis and tenosynovitis 04/30/2008  . HLD (hyperlipidemia) 04/12/2008  . COLONIC POLYPS 11/21/2007  . MI 11/21/2007  . Coronary atherosclerosis 11/21/2007  . GERD 11/21/2007  . Rheumatoid arthritis ([place]) 11/21/2007  . SLEEP APNEA, MILD 11/21/2007    Orientation RESPIRATION BLADDER Height & Weight     Self  O2 Incontinent Weight: 180 lb (81.6 kg) Height:  6' 1"  (185.4 cm)  BEHAVIORAL SYMPTOMS/MOOD NEUROLOGICAL BOWEL NUTRITION STATUS      Incontinent    AMBULATORY STATUS COMMUNICATION OF NEEDS Skin   Limited Assist Verbally Normal                       Personal Care Assistance Level of Assistance  Bathing, Dressing Bathing Assistance: Limited assistance   Dressing Assistance: Limited assistance     Functional Limitations Info  SPECIAL CARE FACTORS FREQUENCY  PT (By licensed PT), OT (By licensed OT)     PT Frequency: 5x/wk OT Frequency: 5x/wk            Contractures Contractures Info: Not present    Additional Factors Info  Code Status, Allergies Code Status Info: DNR Allergies Info: NKDA           Current Medications  (07/29/2020):  This is the current hospital active medication list Current Facility-Administered Medications  Medication Dose Route Frequency Provider Last Rate Last Admin  . acetaminophen (TYLENOL) tablet 650 mg  650 mg Oral Q6H PRN Barb Merino, MD   650 mg at 07/27/20 1702  . ascorbic acid (VITAMIN C) tablet 500 mg  500 mg Oral Daily Rise Patience, MD   500 mg at 07/28/20 1039  . aspirin chewable tablet 81 mg  81 mg Oral BID PC Rise Patience, MD   81 mg at 07/28/20 1709  . carvedilol (COREG) tablet 6.25 mg  6.25 mg Oral BID WC Rise Patience, MD   6.25 mg at 07/28/20 1740  . chlorpheniramine-HYDROcodone (TUSSIONEX) 10-8 MG/5ML suspension 5 mL  5 mL Oral Q12H PRN Rise Patience, MD      . docusate sodium (COLACE) capsule 100 mg  100 mg Oral BID Rise Patience, MD   100 mg at 07/28/20 2256  . donepezil (ARICEPT) tablet 5 mg  5 mg Oral QHS Rise Patience, MD   5 mg at 07/28/20 2256  . enoxaparin (LOVENOX) injection 40 mg  40 mg Subcutaneous Q24H Rise Patience, MD   40 mg at 07/28/20 1038  . guaiFENesin-dextromethorphan (ROBITUSSIN DM) 100-10 MG/5ML syrup 10 mL  10 mL Oral Q4H PRN Rise Patience, MD   10 mL at 07/26/20 1557  . insulin aspart (novoLOG) injection 0-9 Units  0-9 Units Subcutaneous TID WC Rise Patience, MD   3 Units at 07/28/20 1741  . LORazepam (ATIVAN) tablet 0.5 mg  0.5 mg Oral 2 times per day Rise Patience, MD   0.5 mg at 07/28/20 2256  . magnesium oxide (MAG-OX) tablet 200 mg  200 mg Oral Daily Rise Patience, MD   200 mg at 07/28/20 1039  . mesalamine (LIALDA) EC tablet 2.4 g  2.4 g Oral BID Rise Patience, MD   2.4 g at 07/28/20 1039  . methylPREDNISolone sodium succinate (SOLU-MEDROL) 40 mg/mL injection 40 mg  40 mg Intravenous Daily Ghimire, Dante Gang, MD      . nitroGLYCERIN (NITROSTAT) SL tablet 0.4 mg  0.4 mg Sublingual Q5 min PRN Rise Patience, MD      . ondansetron [facility]) tablet 4 mg  4 mg  Oral Q6H PRN Rise Patience, MD       Or  . ondansetron [facility]) injection 4 mg  4 mg Intravenous Q6H PRN Rise Patience, MD   4 mg at 07/27/20 2346  . oxybutynin (DITROPAN) tablet 5 mg  5 mg Oral Q12H Rise Patience, MD   5 mg at 07/28/20 2257  . pantoprazole (PROTONIX) EC tablet 40 mg  40 mg Oral Daily Rise Patience, MD   40 mg at 07/28/20 1039  . pravastatin (PRAVACHOL) tablet 40 mg  40 mg Oral QHS Rise Patience, MD   40 mg at 07/28/20 2256  . primidone (MYSOLINE) tablet 50 mg  50 mg Oral QHS Rise Patience, MD   50 mg at 07/27/20 2348  . remdesivir 100 mg  in sodium chloride 0.9 % 100 mL IVPB  100 mg Intravenous Daily Rise Patience, MD   Stopped at 07/28/20 1109  . vitamin A capsule 10,000 Units  10,000 Units Oral Daily Rise Patience, MD   10,000 Units at 07/28/20 1039  . vitamin B-12 (CYANOCOBALAMIN) tablet 500 mcg  500 mcg Oral QPM Rise Patience, MD   500 mcg at 07/27/20 1813  . vitamin E capsule 400 Units  400 Units Oral Daily Rise Patience, MD   400 Units at 07/28/20 1039  . zinc sulfate capsule 220 mg  220 mg Oral Daily Rise Patience, MD   220 mg at 07/28/20 1039     Discharge Medications: Please see discharge summary for a list of discharge medications.  Relevant Imaging Results:  Relevant Lab Results:   Additional Information [id number]  Lennart Pall, LCSW

## 2020-07-29 NOTE — Discharge Summary (Signed)
Physician Discharge Summary  Anthony [name] RDE:[id number] DOB: Nov 03, 1934 DOA: 07/25/2020  PCP: Josetta Huddle, MD  Admit date: 07/25/2020 Discharge date: 07/29/2020  Admitted From: Skilled nursing home Disposition: Skilled nursing home  Recommendations for Outpatient Follow-up:  1. Follow up with PCP in 1-2 weeks 2. Please obtain BMP/CBC in one week   Discharge Condition: Fair CODE STATUS: DNR Diet recommendation: Low-salt low-carb diet  Discharge summary: 86 year old gentleman with history of dementia, profound short-term memory loss, coronary artery disease, diabetes not on treatment, Crohn's disease and rheumatoid arthritis on chronic prednisone therapy, unvaccinated against COVID-19 infection who was found to be tachypneic and coughing at skilled nursing facility where he has been living for 8 years.  Initially requiring 4 L oxygen.  CTA showed bilateral infiltrates right more than left but negative for PE.  Admitted to the hospital due to significant symptoms.  Treated for different conditions as below:  Acute respiratory disease due to COVID-19 virus: Patient was found to be mildly hypoxic with increased inflammatory markers on admission.  He was treated with physiotherapy, incentive spirometry, deep breathing exercises and bronchodilators with improvement.  He was treated with remdesivir that he is finishing today day 5/5.  He was treated with high-dose steroids that will be changed to oral steroids today for 5 more days. -Patient is on chronic prednisone therapy that he will continue, will do additional dexamethasone for 5 more days and he will go back on prednisone. -Patient may need some supplemental oxygen until complete recovery, currently on 2 L oxygen with saturations more than 95%.  His saturation goal would be 88% or above. COVID-19 Labs  Recent Labs    07/27/20 0500 07/28/20 0456 07/29/20 0630  DDIMER 2.04* 3.29* 1.85*  CRP 10.7* 5.2* 2.6*    Lab Results  Component  Value Date   SARSCOV2NAA POSITIVE (A) 07/25/2020   [place] NEGATIVE 03/23/2020   SARSCOV2NAA NOT DETECTED 03/01/2019   Steroid induced hyperglycemia: Cover with sliding scale insulin in the hospital.  Can resume long-term care that was done at nursing home.  Not on any treatment.  Dementia without visual disturbances/high fall risk: On symptomatic treatment with Aricept, primidone and benzodiazepine as needed.  Avoid narcotics as possible.  Hypertension: Blood pressure stable.  Patient is chronically sick but today stable.  He is on minimum or no oxygen.  He can be transferred back to a skilled nursing facility where he is there as long-term care to continue to recover.  Discharge Diagnoses:  Principal Problem:   Acute respiratory disease due to COVID-19 virus Active Problems:   Rheumatoid arthritis ([employer])   Chronic combined systolic and diastolic heart failure (HCC)   Crohn's disease of large bowel ([place])   Benign essential HTN    Discharge Instructions  Discharge Instructions    Call MD for:  difficulty breathing, headache or visual disturbances   Complete by: As directed    Call MD for:  extreme fatigue   Complete by: As directed    Call MD for:  temperature >100.4   Complete by: As directed    Diet - low sodium heart healthy   Complete by: As directed    Discharge instructions   Complete by: As directed    Keep on supplemental oxygen as needed to keep saturations more than 88%. Adequate chest physiotherapy, incentive spirometry to continue. Isolation duration as per nursing home protocol.   Increase activity slowly   Complete by: As directed      Allergies as of 07/29/2020  No Known Allergies     Medication List    STOP taking these medications   aspirin 81 MG chewable tablet     TAKE these medications   acetaminophen 325 MG tablet Commonly known as: TYLENOL Take 650 mg by mouth See admin instructions. Take 2 tablets (650 mg) by mouth three times daily  for pain management, may also take 2 tablets (650 mg) every 6 hours as needed for mild pain/fever   antiseptic oral rinse Liqd 15 mLs by Mouth Rinse route every 6 (six) hours as needed for dry mouth.   ascorbic acid 500 MG tablet Commonly known as: VITAMIN C Take 1,000 mg by mouth daily. COVID prophylaxis   b complex vitamins tablet Take 1 tablet by mouth daily.   B-12 500 MCG Tabs Take 500 mcg by mouth every evening.   carvedilol 6.25 MG tablet Commonly known as: COREG TAKE 1 TABLET TWICE A DAY WITH A MEAL. What changed:   how much to take  how to take this  when to take this  additional instructions   dexamethasone 6 MG tablet Commonly known as: DECADRON Take 1 tablet (6 mg total) by mouth daily for 5 days. Take 1 tablet (6 mg) Daily PRN if SATS Go Below 93% What changed: when to take this   docusate sodium 100 MG capsule Commonly known as: COLACE Take 1 capsule (100 mg total) by mouth 2 (two) times daily.   donepezil 5 MG tablet Commonly known as: ARICEPT Take 5 mg by mouth at bedtime.   feeding supplement (ENSURE ENLIVE) Liqd Take 237 mLs by mouth 2 (two) times daily between meals.   furosemide 40 MG tablet Commonly known as: LASIX Take 1 tablet (40 mg total) by mouth daily.   guaiFENesin 600 MG 12 hr tablet Commonly known as: MUCINEX Take 600 mg by mouth every 12 (twelve) hours.   guaiFENesin-dextromethorphan 100-10 MG/5ML syrup Commonly known as: ROBITUSSIN DM Take 5 mLs by mouth every 4 (four) hours as needed for cough.   HYDROcodone-acetaminophen 5-325 MG tablet Commonly known as: NORCO/VICODIN Take 1-2 tablets by mouth every 4 (four) hours as needed for moderate pain (pain score 4-6). What changed:   how much to take  reasons to take this   ipratropium-albuterol 0.5-2.5 (3) MG/3ML Soln Commonly known as: DUONEB Take 3 mLs by nebulization every 6 (six) hours as needed (shortness of breath/wheezing).   LORazepam 0.5 MG tablet Commonly known  as: ATIVAN Take 1 tablet (0.5 mg total) by mouth 2 (two) times daily for 5 days. Take one tablet (0.5 mg) by mouth twice daily - 4pm and bedtime What changed: when to take this   Magnesium 200 MG Tabs Take 200 mg by mouth daily.   melatonin 3 MG Tabs tablet Take 6 mg by mouth at bedtime.   mesalamine 1.2 g EC tablet Commonly known as: LIALDA Take 2.4 g by mouth 2 (two) times daily.   nitroGLYCERIN 0.4 MG SL tablet Commonly known as: NITROSTAT Place 0.4 mg under the tongue every 5 (five) minutes as needed for chest pain.   omeprazole 40 MG capsule Commonly known as: PRILOSEC Take 40 mg by mouth at bedtime.   oxybutynin 5 MG tablet Commonly known as: DITROPAN Take 5 mg by mouth every 12 (twelve) hours.   pravastatin 40 MG tablet Commonly known as: PRAVACHOL Take 1 tablet (40 mg total) by mouth at bedtime.   predniSONE 10 MG tablet Commonly known as: DELTASONE Take 10 mg by mouth daily.  PreviDent 5000 Plus 1.1 % Crea dental cream Generic drug: sodium fluoride Place 1 application onto teeth See admin instructions. Daily at bedtime: Brush on teeth with a toothbrush after PM mouth care. Spit out excess and do NOT rinse   primidone 50 MG tablet Commonly known as: MYSOLINE Take 50 mg by mouth at bedtime.   vitamin A 10000 UNIT capsule Take 10,000 Units by mouth daily.   Vitamin D3 50 MCG (2000 UT) capsule Take 2,000 Units by mouth daily.   vitamin E 180 MG (400 UNITS) capsule Take 400 Units by mouth daily.   zinc sulfate 220 (50 Zn) MG capsule Take 220 mg by mouth daily.       Follow-up Information    Josetta Huddle, MD Follow up.   Specialty: Internal Medicine Contact information: [address] [place] [address] [place] [address] [phone]              No Known Allergies  Consultations:  None   Procedures/Studies: DG Cervical Spine 2 or 3 views  Result Date: 07/27/2020 CLINICAL DATA:  Found on floor.  Neck pain EXAM: CERVICAL SPINE - 2-3  VIEW COMPARISON:  None. FINDINGS: Normal alignment. Diffuse degenerative disc and advanced degenerative facet disease. No visible fracture or subluxation. Prevertebral soft tissues are normal. IMPRESSION: Diffuse advanced degenerative changes.  No acute bony abnormality. Electronically Signed   By: Rolm Baptise M.D.   On: 07/27/2020 21:24   CT Angio Chest PE W/Cm &/Or Wo Cm  Result Date: 07/25/2020 CLINICAL DATA:  COVID positive and cough EXAM: CT ANGIOGRAPHY CHEST WITH CONTRAST TECHNIQUE: Multidetector CT imaging of the chest was performed using the standard protocol during bolus administration of intravenous contrast. Multiplanar CT image reconstructions and MIPs were obtained to evaluate the vascular anatomy. CONTRAST:  154m OMNIPAQUE IOHEXOL 350 MG/ML SOLN COMPARISON:  March 01, 2019 FINDINGS: Cardiovascular: There is a optimal opacification of the pulmonary arteries. There is no central,segmental, or subsegmental filling defects within the pulmonary arteries. There is mild cardiomegaly. No pericardial effusion or thickening. No evidence right heart strain. There is normal three-vessel brachiocephalic anatomy without proximal stenosis. Scattered aortic atherosclerosis is seen. Coronary artery calcifications are seen. Mediastinum/Nodes: No hilar, mediastinal, or axillary adenopathy. Again noted is a heterogeneously nodular left thyroid gland. The esophagus and bronchi are unremarkable. Lungs/Pleura: Streaky patchy airspace opacity seen at the periphery of both lower lungs, right greater than left. No large airspace consolidation or pleural effusion. Upper Abdomen: No acute abnormalities present in the visualized portions of the upper abdomen. Musculoskeletal: No chest wall abnormality. No acute or significant osseous findings. Review of the MIP images confirms the above findings. IMPRESSION: No central, segmental, or subsegmental pulmonary embolism. Hazy ground-glass patchy airspace opacity seen at both lung  bases, right greater than left which could be due to atelectasis and/or infectious etiology. Electronically Signed   By: BPrudencio PairM.D.   On: 07/25/2020 21:58   DG Chest Port 1 View  Result Date: 07/25/2020 CLINICAL DATA:  Cough with COVID positive. EXAM: PORTABLE CHEST 1 VIEW COMPARISON:  Mar 23, 2020 FINDINGS: Mild atelectasis is seen within the bilateral lung bases. There is no evidence of a pleural effusion or pneumothorax. The heart size and mediastinal contours are within normal limits. The visualized skeletal structures are unremarkable. IMPRESSION: Mild bibasilar atelectasis. Electronically Signed   By: TVirgina [place]M.D.   On: 07/25/2020 19:18   (Echo, Carotid, EGD, Colonoscopy, ERCP)    Subjective: Patient was seen and examined.  Poor  historian.  He was sleepy and he stated to me that he is sleeping because he has not slept well.  Denies any complaints.  No overnight events as per nursing staff.   Discharge Exam: Vitals:   07/29/20 0830 07/29/20 0929  BP:  136/71  Pulse:  70  Resp:  20  Temp:  97.9 F (36.6 C)  SpO2: 95% 96%   Vitals:   07/29/20 0713 07/29/20 0800 07/29/20 0830 07/29/20 0929  BP: 127/74 113/65  136/71  Pulse: 69 71  70  Resp: 20 (!) 22  20  Temp:    97.9 F (36.6 C)  TempSrc:    Oral  SpO2: 92% 92% 95% 96%  Weight:      Height:        General: Pt is alert, awake, not in acute distress Comfortable, confused but pleasant. Cardiovascular: RRR, S1/S2 +, no rubs, no gallops Respiratory: CTA bilaterally, no wheezing, no rhonchi, no added sounds Abdominal: Soft, NT, ND, bowel sounds + Extremities: no edema, no cyanosis    The results of significant diagnostics from this hospitalization (including imaging, microbiology, ancillary and laboratory) are listed below for reference.     Microbiology: Recent Results (from the past 240 hour(s))  SARS Coronavirus 2 by RT PCR (hospital order, performed in [facility] hospital lab) Nasopharyngeal  Nasopharyngeal Swab     Status: Abnormal   Collection Time: 07/25/20  7:12 PM   Specimen: Nasopharyngeal Swab  Result Value Ref Range Status   SARS Coronavirus 2 POSITIVE (A) NEGATIVE Final    Comment: RESULT CALLED TO, READ BACK BY AND VERIFIED WITH: ILENE HODGES @ 2105 ON 07/25/20 C VARNER (NOTE) SARS-CoV-2 target nucleic acids are DETECTED  SARS-CoV-2 RNA is generally detectable in upper respiratory specimens  during the acute phase of infection.  Positive results are indicative  of the presence of the identified virus, but do not rule out bacterial infection or co-infection with other pathogens not detected by the test.  Clinical correlation with patient history and  other diagnostic information is necessary to determine patient infection status.  The expected result is negative.  Fact Sheet for Patients:   StrictlyIdeas.no   Fact Sheet for Healthcare Providers:   BankingDealers.co.za    This test is not yet approved or cleared by the [place] FDA and  has been authorized for detection and/or diagnosis of SARS-CoV-2 by FDA under an Emergency Use Authorization (EUA).  This EUA will remain in effect (meaning thi s test can be used) for the duration of  the COVID-19 declaration under Section 564(b)(1) of the Act, 21 U.S.C. section 360-bbb-3(b)(1), unless the authorization is terminated or revoked sooner.  Performed at [facility], [place] [address] [place], [place] [address]   Blood Culture (routine x 2)     Status: None (Preliminary result)   Collection Time: 07/25/20  8:00 PM   Specimen: BLOOD RIGHT FOREARM  Result Value Ref Range Status   Specimen Description   Final    BLOOD RIGHT FOREARM Performed at [facility], [place] [address], [place], [place] [address]    Special Requests   Final    BOTTLES DRAWN AEROBIC AND ANAEROBIC Blood Culture adequate volume Performed at [facility] [address] [place], [place] [address]    Culture   Final    NO GROWTH 4 DAYS Performed at [facility], [place] [address], [place], [place] [address]    Report Status PENDING  Incomplete  Labs: BNP (last 3 results) No results for input(s): BNP in the last 8760 hours. Basic Metabolic Panel: Recent Labs  Lab 07/25/20 2000 07/26/20 0445 07/27/20 0500 07/28/20 0456 07/29/20 0630  NA 137 138 133* 134* 136  K 3.6 3.7 3.6 4.0 4.2  CL 100 104 101 102 101  CO2 23 22 22  21* 23  GLUCOSE 177* 255* 211* 210* 166*  BUN 13 14 25* 25* 32*  CREATININE 1.11 0.92 0.89 0.85 1.01  CALCIUM 8.3* 8.3* 8.4* 8.4* 8.6*   Liver Function Tests: Recent Labs  Lab 07/25/20 2000 07/26/20 0445 07/27/20 0500 07/28/20 0456 07/29/20 0630  AST 21 21 24 29 26   ALT 17 18 20 21 20   ALKPHOS 63 65 59 58 56  BILITOT 0.6 0.8 0.4 0.5 0.6  PROT 6.2* 6.3* 6.3* 6.4* 6.0*  ALBUMIN 3.2* 3.3* 3.1* 3.2* 3.1*   No results for input(s): LIPASE, AMYLASE in the last 168 hours. No results for input(s): AMMONIA in the last 168 hours. CBC: Recent Labs  Lab 07/25/20 2000 07/26/20 0445 07/27/20 0500 07/28/20 0456 07/29/20 0630  WBC 8.7 7.4 12.9* 17.4* 17.1*  NEUTROABS 6.7 6.9 11.8* 16.3* 15.2*  HGB 13.9 14.3 13.6 14.3 13.5  HCT 42.3 43.0 41.2 42.9 41.3  MCV 91.4 91.1 90.4 90.3 91.0  PLT 190 178 208 210 210   Cardiac Enzymes: No results for input(s): CKTOTAL, CKMB, CKMBINDEX, TROPONINI in the last 168 hours. BNP: Invalid input(s): POCBNP CBG: Recent Labs  Lab 07/28/20 0839 07/28/20 1154 07/28/20 1739 07/28/20 2130 07/29/20 0943  GLUCAP 217* 203* 246* 268* 160*   D-Dimer Recent Labs    07/28/20 0456 07/29/20 0630  DDIMER 3.29* 1.85*   Hgb A1c No results for input(s): HGBA1C in the last 72 hours. Lipid Profile No results for input(s): CHOL, HDL, LDLCALC, TRIG, CHOLHDL, LDLDIRECT in the last 72 hours. Thyroid function studies No results for input(s): TSH, T4TOTAL, T3FREE,  THYROIDAB in the last 72 hours.  Invalid input(s): FREET3 Anemia work up No results for input(s): VITAMINB12, FOLATE, FERRITIN, TIBC, IRON, RETICCTPCT in the last 72 hours. Urinalysis    Component Value Date/Time   COLORURINE YELLOW 03/23/2020 1507   APPEARANCEUR CLEAR 03/23/2020 1507   LABSPEC >1.046 (H) 03/23/2020 1507   PHURINE 6.0 03/23/2020 1507   GLUCOSEU NEGATIVE 03/23/2020 1507   HGBUR NEGATIVE 03/23/2020 1507   BILIRUBINUR NEGATIVE 03/23/2020 1507   KETONESUR 5 (A) 03/23/2020 1507   PROTEINUR NEGATIVE 03/23/2020 1507   UROBILINOGEN 0.2 11/05/2014 0303   NITRITE NEGATIVE 03/23/2020 1507   LEUKOCYTESUR NEGATIVE 03/23/2020 1507   Sepsis Labs Invalid input(s): PROCALCITONIN,  WBC,  LACTICIDVEN Microbiology Recent Results (from the past 240 hour(s))  SARS Coronavirus 2 by RT PCR (hospital order, performed in [facility] hospital lab) Nasopharyngeal Nasopharyngeal Swab     Status: Abnormal   Collection Time: 07/25/20  7:12 PM   Specimen: Nasopharyngeal Swab  Result Value Ref Range Status   SARS Coronavirus 2 POSITIVE (A) NEGATIVE Final    Comment: RESULT CALLED TO, READ BACK BY AND VERIFIED WITH: ILENE HODGES @ 2105 ON 07/25/20 C VARNER (NOTE) SARS-CoV-2 target nucleic acids are DETECTED  SARS-CoV-2 RNA is generally detectable in upper respiratory specimens  during the acute phase of infection.  Positive results are indicative  of the presence of the identified virus, but do not rule out bacterial infection or co-infection with other pathogens not detected by the test.  Clinical correlation with patient history and  other diagnostic information is necessary to determine  patient infection status.  The expected result is negative.  Fact Sheet for Patients:   StrictlyIdeas.no   Fact Sheet for Healthcare Providers:   BankingDealers.co.za    This test is not yet approved or cleared by the [place] FDA and  has been  authorized for detection and/or diagnosis of SARS-CoV-2 by FDA under an Emergency Use Authorization (EUA).  This EUA will remain in effect (meaning thi s test can be used) for the duration of  the COVID-19 declaration under Section 564(b)(1) of the Act, 21 U.S.C. section 360-bbb-3(b)(1), unless the authorization is terminated or revoked sooner.  Performed at [facility], [place] [address], [place], [place] [address]   Blood Culture (routine x 2)     Status: None (Preliminary result)   Collection Time: 07/25/20  8:00 PM   Specimen: BLOOD RIGHT FOREARM  Result Value Ref Range Status   Specimen Description   Final    BLOOD RIGHT FOREARM Performed at [facility], [place] [address], [place], [place] [address]    Special Requests   Final    BOTTLES DRAWN AEROBIC AND ANAEROBIC Blood Culture adequate volume Performed at [facility] [address], [place], [place] [address]    Culture   Final    NO GROWTH 4 DAYS Performed at [facility], [place] [address] [place], [place] [address]    Report Status PENDING  Incomplete     Time coordinating discharge:  40 minutes  SIGNED:   Barb Merino, MD  [facility] 07/29/2020, 10:52 AM

## 2020-07-30 LAB — CULTURE, BLOOD (ROUTINE X 2)
Culture: NO GROWTH
Special Requests: ADEQUATE

## 2020-07-31 DIAGNOSIS — R739 Hyperglycemia, unspecified: Secondary | ICD-10-CM | POA: Diagnosis not present

## 2020-07-31 DIAGNOSIS — J189 Pneumonia, unspecified organism: Secondary | ICD-10-CM | POA: Diagnosis not present

## 2020-07-31 DIAGNOSIS — E119 Type 2 diabetes mellitus without complications: Secondary | ICD-10-CM | POA: Diagnosis not present

## 2020-07-31 DIAGNOSIS — I2510 Atherosclerotic heart disease of native coronary artery without angina pectoris: Secondary | ICD-10-CM | POA: Diagnosis not present

## 2020-07-31 DIAGNOSIS — U071 COVID-19: Secondary | ICD-10-CM | POA: Diagnosis not present

## 2020-07-31 DIAGNOSIS — I5022 Chronic systolic (congestive) heart failure: Secondary | ICD-10-CM | POA: Diagnosis not present

## 2020-07-31 DIAGNOSIS — Z7982 Long term (current) use of aspirin: Secondary | ICD-10-CM | POA: Diagnosis not present

## 2020-07-31 DIAGNOSIS — R638 Other symptoms and signs concerning food and fluid intake: Secondary | ICD-10-CM | POA: Diagnosis not present

## 2020-08-01 DIAGNOSIS — J9601 Acute respiratory failure with hypoxia: Secondary | ICD-10-CM | POA: Diagnosis not present

## 2020-08-01 DIAGNOSIS — U071 COVID-19: Secondary | ICD-10-CM | POA: Diagnosis not present

## 2020-08-01 DIAGNOSIS — J189 Pneumonia, unspecified organism: Secondary | ICD-10-CM | POA: Diagnosis not present

## 2020-08-13 ENCOUNTER — Ambulatory Visit: Payer: PPO | Admitting: Neurology

## 2020-08-13 DIAGNOSIS — I10 Essential (primary) hypertension: Secondary | ICD-10-CM | POA: Diagnosis not present

## 2020-08-13 DIAGNOSIS — D509 Iron deficiency anemia, unspecified: Secondary | ICD-10-CM | POA: Diagnosis not present

## 2020-08-14 DIAGNOSIS — I5022 Chronic systolic (congestive) heart failure: Secondary | ICD-10-CM | POA: Diagnosis not present

## 2020-08-14 DIAGNOSIS — R630 Anorexia: Secondary | ICD-10-CM | POA: Diagnosis not present

## 2020-08-14 DIAGNOSIS — U071 COVID-19: Secondary | ICD-10-CM | POA: Diagnosis not present

## 2020-08-14 DIAGNOSIS — R0602 Shortness of breath: Secondary | ICD-10-CM | POA: Diagnosis not present

## 2020-08-14 DIAGNOSIS — R638 Other symptoms and signs concerning food and fluid intake: Secondary | ICD-10-CM | POA: Diagnosis not present

## 2020-08-16 DIAGNOSIS — E119 Type 2 diabetes mellitus without complications: Secondary | ICD-10-CM | POA: Diagnosis not present

## 2020-08-16 DIAGNOSIS — M6281 Muscle weakness (generalized): Secondary | ICD-10-CM | POA: Diagnosis not present

## 2020-08-17 DIAGNOSIS — M542 Cervicalgia: Secondary | ICD-10-CM | POA: Diagnosis not present

## 2020-08-17 DIAGNOSIS — M533 Sacrococcygeal disorders, not elsewhere classified: Secondary | ICD-10-CM | POA: Diagnosis not present

## 2020-08-17 DIAGNOSIS — M25552 Pain in left hip: Secondary | ICD-10-CM | POA: Diagnosis not present

## 2020-08-19 ENCOUNTER — Inpatient Hospital Stay (HOSPITAL_COMMUNITY)
Admission: EM | Admit: 2020-08-19 | Discharge: 2020-08-27 | DRG: 871 | Disposition: A | Discharge status: Skilled Nursing Facility | Payer: PPO | Source: Skilled Nursing Facility | Attending: Internal Medicine | Admitting: Internal Medicine

## 2020-08-19 ENCOUNTER — Emergency Department (HOSPITAL_COMMUNITY): Payer: PPO

## 2020-08-19 ENCOUNTER — Encounter (HOSPITAL_COMMUNITY): Payer: Self-pay | Admitting: Emergency Medicine

## 2020-08-19 ENCOUNTER — Other Ambulatory Visit: Payer: Self-pay

## 2020-08-19 DIAGNOSIS — N400 Enlarged prostate without lower urinary tract symptoms: Secondary | ICD-10-CM | POA: Diagnosis present

## 2020-08-19 DIAGNOSIS — M533 Sacrococcygeal disorders, not elsewhere classified: Secondary | ICD-10-CM | POA: Diagnosis not present

## 2020-08-19 DIAGNOSIS — M069 Rheumatoid arthritis, unspecified: Secondary | ICD-10-CM | POA: Diagnosis present

## 2020-08-19 DIAGNOSIS — K5090 Crohn's disease, unspecified, without complications: Secondary | ICD-10-CM | POA: Diagnosis present

## 2020-08-19 DIAGNOSIS — Y95 Nosocomial condition: Secondary | ICD-10-CM | POA: Diagnosis present

## 2020-08-19 DIAGNOSIS — I10 Essential (primary) hypertension: Secondary | ICD-10-CM | POA: Diagnosis present

## 2020-08-19 DIAGNOSIS — Z96642 Presence of left artificial hip joint: Secondary | ICD-10-CM | POA: Diagnosis not present

## 2020-08-19 DIAGNOSIS — Z8616 Personal history of COVID-19: Secondary | ICD-10-CM

## 2020-08-19 DIAGNOSIS — R0602 Shortness of breath: Secondary | ICD-10-CM | POA: Diagnosis not present

## 2020-08-19 DIAGNOSIS — Z515 Encounter for palliative care: Secondary | ICD-10-CM | POA: Diagnosis not present

## 2020-08-19 DIAGNOSIS — I472 Ventricular tachycardia: Secondary | ICD-10-CM | POA: Diagnosis not present

## 2020-08-19 DIAGNOSIS — R627 Adult failure to thrive: Secondary | ICD-10-CM | POA: Diagnosis not present

## 2020-08-19 DIAGNOSIS — I110 Hypertensive heart disease with heart failure: Secondary | ICD-10-CM | POA: Diagnosis not present

## 2020-08-19 DIAGNOSIS — E785 Hyperlipidemia, unspecified: Secondary | ICD-10-CM | POA: Diagnosis present

## 2020-08-19 DIAGNOSIS — R531 Weakness: Secondary | ICD-10-CM | POA: Diagnosis not present

## 2020-08-19 DIAGNOSIS — E11649 Type 2 diabetes mellitus with hypoglycemia without coma: Secondary | ICD-10-CM | POA: Diagnosis not present

## 2020-08-19 DIAGNOSIS — E43 Unspecified severe protein-calorie malnutrition: Secondary | ICD-10-CM | POA: Diagnosis present

## 2020-08-19 DIAGNOSIS — R1312 Dysphagia, oropharyngeal phase: Secondary | ICD-10-CM | POA: Diagnosis present

## 2020-08-19 DIAGNOSIS — I252 Old myocardial infarction: Secondary | ICD-10-CM

## 2020-08-19 DIAGNOSIS — B370 Candidal stomatitis: Secondary | ICD-10-CM | POA: Diagnosis not present

## 2020-08-19 DIAGNOSIS — F0390 Unspecified dementia without behavioral disturbance: Secondary | ICD-10-CM | POA: Diagnosis not present

## 2020-08-19 DIAGNOSIS — Z66 Do not resuscitate: Secondary | ICD-10-CM | POA: Diagnosis present

## 2020-08-19 DIAGNOSIS — M25552 Pain in left hip: Secondary | ICD-10-CM | POA: Diagnosis not present

## 2020-08-19 DIAGNOSIS — K589 Irritable bowel syndrome without diarrhea: Secondary | ICD-10-CM | POA: Diagnosis present

## 2020-08-19 DIAGNOSIS — R0682 Tachypnea, not elsewhere classified: Secondary | ICD-10-CM | POA: Diagnosis not present

## 2020-08-19 DIAGNOSIS — I2510 Atherosclerotic heart disease of native coronary artery without angina pectoris: Secondary | ICD-10-CM | POA: Diagnosis not present

## 2020-08-19 DIAGNOSIS — A419 Sepsis, unspecified organism: Secondary | ICD-10-CM | POA: Diagnosis not present

## 2020-08-19 DIAGNOSIS — T380X5A Adverse effect of glucocorticoids and synthetic analogues, initial encounter: Secondary | ICD-10-CM | POA: Diagnosis present

## 2020-08-19 DIAGNOSIS — E872 Acidosis: Secondary | ICD-10-CM | POA: Diagnosis present

## 2020-08-19 DIAGNOSIS — I5042 Chronic combined systolic (congestive) and diastolic (congestive) heart failure: Secondary | ICD-10-CM | POA: Diagnosis present

## 2020-08-19 DIAGNOSIS — E1165 Type 2 diabetes mellitus with hyperglycemia: Secondary | ICD-10-CM | POA: Diagnosis not present

## 2020-08-19 DIAGNOSIS — Z20822 Contact with and (suspected) exposure to covid-19: Secondary | ICD-10-CM | POA: Diagnosis not present

## 2020-08-19 DIAGNOSIS — J189 Pneumonia, unspecified organism: Secondary | ICD-10-CM | POA: Diagnosis present

## 2020-08-19 DIAGNOSIS — M542 Cervicalgia: Secondary | ICD-10-CM | POA: Diagnosis not present

## 2020-08-19 DIAGNOSIS — E119 Type 2 diabetes mellitus without complications: Secondary | ICD-10-CM | POA: Diagnosis not present

## 2020-08-19 DIAGNOSIS — F05 Delirium due to known physiological condition: Secondary | ICD-10-CM | POA: Diagnosis not present

## 2020-08-19 DIAGNOSIS — Z7189 Other specified counseling: Secondary | ICD-10-CM

## 2020-08-19 DIAGNOSIS — I959 Hypotension, unspecified: Secondary | ICD-10-CM | POA: Diagnosis not present

## 2020-08-19 DIAGNOSIS — E860 Dehydration: Secondary | ICD-10-CM | POA: Diagnosis not present

## 2020-08-19 DIAGNOSIS — Z7952 Long term (current) use of systemic steroids: Secondary | ICD-10-CM

## 2020-08-19 DIAGNOSIS — N179 Acute kidney failure, unspecified: Secondary | ICD-10-CM | POA: Diagnosis present

## 2020-08-19 DIAGNOSIS — Z87891 Personal history of nicotine dependence: Secondary | ICD-10-CM

## 2020-08-19 DIAGNOSIS — J690 Pneumonitis due to inhalation of food and vomit: Secondary | ICD-10-CM | POA: Diagnosis not present

## 2020-08-19 DIAGNOSIS — G9341 Metabolic encephalopathy: Secondary | ICD-10-CM | POA: Diagnosis not present

## 2020-08-19 DIAGNOSIS — I4891 Unspecified atrial fibrillation: Secondary | ICD-10-CM | POA: Diagnosis not present

## 2020-08-19 DIAGNOSIS — R0902 Hypoxemia: Secondary | ICD-10-CM | POA: Diagnosis not present

## 2020-08-19 DIAGNOSIS — E46 Unspecified protein-calorie malnutrition: Secondary | ICD-10-CM

## 2020-08-19 DIAGNOSIS — Z79899 Other long term (current) drug therapy: Secondary | ICD-10-CM

## 2020-08-19 DIAGNOSIS — E876 Hypokalemia: Secondary | ICD-10-CM | POA: Diagnosis present

## 2020-08-19 DIAGNOSIS — R001 Bradycardia, unspecified: Secondary | ICD-10-CM | POA: Diagnosis not present

## 2020-08-19 LAB — CBC WITH DIFFERENTIAL/PLATELET
Abs Immature Granulocytes: 0.22 10*3/uL — ABNORMAL HIGH (ref 0.00–0.07)
Basophils Absolute: 0.1 10*3/uL (ref 0.0–0.1)
Basophils Relative: 1 %
Eosinophils Absolute: 0.1 10*3/uL (ref 0.0–0.5)
Eosinophils Relative: 1 %
HCT: 51.4 % (ref 39.0–52.0)
Hemoglobin: 16.6 g/dL (ref 13.0–17.0)
Immature Granulocytes: 2 %
Lymphocytes Relative: 4 %
Lymphs Abs: 0.7 10*3/uL (ref 0.7–4.0)
MCH: 29.6 pg (ref 26.0–34.0)
MCHC: 32.3 g/dL (ref 30.0–36.0)
MCV: 91.8 fL (ref 80.0–100.0)
Monocytes Absolute: 0.6 10*3/uL (ref 0.1–1.0)
Monocytes Relative: 4 %
Neutro Abs: 13.4 10*3/uL — ABNORMAL HIGH (ref 1.7–7.7)
Neutrophils Relative %: 88 %
Platelets: 291 10*3/uL (ref 150–400)
RBC: 5.6 MIL/uL (ref 4.22–5.81)
RDW: 14.8 % (ref 11.5–15.5)
WBC: 15.1 10*3/uL — ABNORMAL HIGH (ref 4.0–10.5)
nRBC: 0 % (ref 0.0–0.2)

## 2020-08-19 LAB — BASIC METABOLIC PANEL
Anion gap: 13 (ref 5–15)
BUN: 33 mg/dL — ABNORMAL HIGH (ref 8–23)
CO2: 26 mmol/L (ref 22–32)
Calcium: 8.5 mg/dL — ABNORMAL LOW (ref 8.9–10.3)
Chloride: 99 mmol/L (ref 98–111)
Creatinine, Ser: 1.05 mg/dL (ref 0.61–1.24)
GFR calc Af Amer: >60 mL/min (ref >60)
GFR calc non Af Amer: >60 mL/min (ref >60)
Glucose, Bld: 327 mg/dL — ABNORMAL HIGH (ref 70–99)
Potassium: 3.6 mmol/L (ref 3.5–5.1)
Sodium: 138 mmol/L (ref 135–145)

## 2020-08-19 LAB — CBG MONITORING, ED: Glucose-Capillary: 355 mg/dL — ABNORMAL HIGH (ref 70–99)

## 2020-08-19 LAB — COMPREHENSIVE METABOLIC PANEL
ALT: 25 U/L (ref 0–44)
AST: 21 U/L (ref 15–41)
Albumin: 2.7 g/dL — ABNORMAL LOW (ref 3.5–5.0)
Alkaline Phosphatase: 97 U/L (ref 38–126)
Anion gap: 16 — ABNORMAL HIGH (ref 5–15)
BUN: 40 mg/dL — ABNORMAL HIGH (ref 8–23)
CO2: 25 mmol/L (ref 22–32)
Calcium: 9.3 mg/dL (ref 8.9–10.3)
Chloride: 98 mmol/L (ref 98–111)
Creatinine, Ser: 1.34 mg/dL — ABNORMAL HIGH (ref 0.61–1.24)
GFR calc Af Amer: 56 mL/min — ABNORMAL LOW (ref >60)
GFR calc non Af Amer: 48 mL/min — ABNORMAL LOW (ref >60)
Glucose, Bld: 372 mg/dL — ABNORMAL HIGH (ref 70–99)
Potassium: 3.7 mmol/L (ref 3.5–5.1)
Sodium: 139 mmol/L (ref 135–145)
Total Bilirubin: 1 mg/dL (ref 0.3–1.2)
Total Protein: 6.8 g/dL (ref 6.5–8.1)

## 2020-08-19 LAB — CBC
HCT: 45.3 % (ref 39.0–52.0)
Hemoglobin: 14.5 g/dL (ref 13.0–17.0)
MCH: 28.9 pg (ref 26.0–34.0)
MCHC: 32 g/dL (ref 30.0–36.0)
MCV: 90.4 fL (ref 80.0–100.0)
Platelets: 234 10*3/uL (ref 150–400)
RBC: 5.01 MIL/uL (ref 4.22–5.81)
RDW: 14.8 % (ref 11.5–15.5)
WBC: 13.1 10*3/uL — ABNORMAL HIGH (ref 4.0–10.5)
nRBC: 0 % (ref 0.0–0.2)

## 2020-08-19 LAB — APTT: aPTT: 32 s (ref 24–36)

## 2020-08-19 LAB — PROTIME-INR
INR: 1.3 — ABNORMAL HIGH (ref 0.8–1.2)
Prothrombin Time: 15.6 s — ABNORMAL HIGH (ref 11.4–15.2)

## 2020-08-19 LAB — LACTIC ACID, PLASMA
Lactic Acid, Venous: 2.9 mmol/L (ref 0.5–1.9)
Lactic Acid, Venous: 2.3 mmol/L (ref 0.5–1.9)

## 2020-08-19 MED ORDER — PIPERACILLIN-TAZOBACTAM 3.375 G IVPB
3.3750 g | Freq: Three times a day (TID) | INTRAVENOUS | Status: DC
  Administered 2020-08-19 – 2020-08-25 (×18): 3.375 g via INTRAVENOUS
  Filled 2020-08-19 (×18): qty 50

## 2020-08-19 MED ORDER — SODIUM CHLORIDE 0.9 % IV SOLN
1.0000 g | Freq: Once | INTRAVENOUS | Status: AC
  Administered 2020-08-19: 1 g via INTRAVENOUS
  Filled 2020-08-19: qty 10

## 2020-08-19 MED ORDER — LACTATED RINGERS IV BOLUS (SEPSIS)
500.0000 mL | Freq: Once | INTRAVENOUS | Status: AC
  Administered 2020-08-19: 500 mL via INTRAVENOUS

## 2020-08-19 MED ORDER — IPRATROPIUM-ALBUTEROL 0.5-2.5 (3) MG/3ML IN SOLN: 3.0000 mL | RESPIRATORY_TRACT | Status: DC | PRN

## 2020-08-19 MED ORDER — INSULIN ASPART 100 UNIT/ML ~~LOC~~ SOLN
0.0000 [IU] | Freq: Three times a day (TID) | SUBCUTANEOUS | Status: DC
  Administered 2020-08-20: 6 [IU] via SUBCUTANEOUS
  Administered 2020-08-20 (×2): 5 [IU] via SUBCUTANEOUS

## 2020-08-19 MED ORDER — LACTATED RINGERS IV SOLN: INTRAVENOUS | Status: DC

## 2020-08-19 MED ORDER — LACTATED RINGERS IV BOLUS (SEPSIS)
1000.0000 mL | Freq: Once | INTRAVENOUS | Status: AC
  Administered 2020-08-19: 1000 mL via INTRAVENOUS

## 2020-08-19 MED ORDER — SODIUM CHLORIDE 0.9 % IV SOLN
500.0000 mg | Freq: Once | INTRAVENOUS | Status: AC
  Administered 2020-08-19: 500 mg via INTRAVENOUS
  Filled 2020-08-19: qty 500

## 2020-08-19 MED ORDER — METHYLPREDNISOLONE SODIUM SUCC 40 MG IJ SOLR
40.0000 mg | Freq: Two times a day (BID) | INTRAMUSCULAR | Status: DC
  Administered 2020-08-19 – 2020-08-24 (×10): 40 mg via INTRAVENOUS
  Filled 2020-08-19 (×10): qty 1

## 2020-08-19 NOTE — Progress Notes (Signed)
Notified bedside nurse of need to draw repeat lactic acid. 

## 2020-08-19 NOTE — Sepsis Progress Note (Signed)
Notified provider of need to order repeat lactic acid. Repeat was never collected in ED.

## 2020-08-19 NOTE — ED Notes (Signed)
Pt having periods of apnea- skin cool to touch, afebrile

## 2020-08-19 NOTE — ED Triage Notes (Signed)
ED via [facility] from [facility] with staff stating pt is lethargic, not acting normal- RECENT COVID Sept 2nd=- spent 2 weeks intheir covid unit, recently tranferred back out. Not eating normally --  Pt is not answering on arrival, is lethargic, tongue is dry- skin cool

## 2020-08-19 NOTE — Progress Notes (Signed)
Notified bedside nurse of need to draw lactic acid and blood cultures, give antibiotics.

## 2020-08-19 NOTE — ED Provider Notes (Addendum)
[facility] EMERGENCY DEPARTMENT Provider Note   CSN: [id number] Arrival date & time: 08/19/20  1352     History Chief Complaint  Patient presents with  . Failure To Thrive  . recent covid    Anthony [name] is a 86 y.o. male.  Pt is at a nursing home.  Pt had covid Sept 2.  Pt unresponsive/less responsive today.    The history is provided by the patient. No language interpreter was used.  Weakness Severity:  Moderate Onset quality:  Gradual Timing:  Constant Progression:  Worsening Chronicity:  New Relieved by:  Nothing Worsened by:  Nothing Ineffective treatments:  None tried Risk factors: no new medications        Past Medical History:  Diagnosis Date  . Arthritis    rheumatoid-  Dr Barkley Boards  . CHF (congestive heart failure) (HCC)    chronic systolic heart failure per Dr [name] [facility] note 09/23/11.   EKG 4/12, stress test  and cath from 8/12 on chart.  Clearance with LOV Dr [name] on chart  . Coronary artery disease   . Crohn disease ([place])   . Dementia ([place])   . Dysrhythmia    nonsustained,asymptomatic VT per Dr [name] office note  resulting in cath  . GERD (gastroesophageal reflux disease)   . Heart attack ([place]) 1996, 06/2011  . Hyperlipidemia   . Hypertension   . Pneumonia 1993  . Prostate troubles    states take alternating meds for bladder/prostate control- "age thing"  . Sleep apnea    sleep study years ago- has apnea but did not qualify for CPAP    Patient Active Problem List   Diagnosis Date Noted  . Acute respiratory disease due to COVID-19 virus 07/25/2020  . Hypokalemia 03/23/2020  . S/P hip hemiarthroplasty 04/19/2019  . Pressure injury of skin 03/05/2019  . Suspected COVID-19 virus infection 03/01/2019  . Acute respiratory failure with hypoxia ([place]) 03/01/2019  . Hip fracture ([place]) 03/01/2019  . Fall 03/01/2019  . Displaced fracture of base of neck of left femur, initial encounter for closed fracture ([place])   . Fever   .  Nonspecific interstitial pneumonia ([place])   . Febrile illness   . Thrush   . Hypoxia   . Sepsis ([place]) 10/04/2018  . Fatty liver 04/06/2018  . Acute pancreatitis 04/04/2018  . Abdominal pain, right lower quadrant 05/28/2015  . Absolute anemia 05/28/2015  . Aortic atherosclerosis ([place]) 05/28/2015  . Altered blood in stool 05/28/2015  . Chronic systolic heart failure ([place]) 05/28/2015  . Syncope and collapse 05/28/2015  . Arteriosclerosis of coronary artery 05/28/2015  . Cough 05/28/2015  . Crohn's disease of large bowel ([place]) 05/28/2015  . Diarrhea 05/28/2015  . Displacement of cervical intervertebral disc without myelopathy 05/28/2015  . Benign essential HTN 05/28/2015  . Adenopathy 05/28/2015  . Hypercholesterolemia without hypertriglyceridemia 05/28/2015  . Hypo-osmolality and hyponatremia 05/28/2015  . Postinflammatory pulmonary fibrosis ([place]) 05/28/2015  . Healed myocardial infarct 05/28/2015  . Peptic esophagitis 05/28/2015  . 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 Paroxysmal ventricular tachycardia ([place]) 05/28/2015  . Decreased body weight 05/28/2015  . Weakness 11/05/2014  . Nausea with vomiting 11/05/2014  . Generalized weakness   . Dehydration   . Difficulty walking   . Pulmonary infiltrates 07/16/2014  . Chronic combined systolic and diastolic heart failure ([place]) 05/31/2014  . Essential hypertension 05/31/2014  .  H/O Right inguinal hernia repair 01/15/2012  . Small fat containing right inguinal hernia that has been painful 08/05/2011  .  Finger wound, simple, open 10/12/2008  . Benign prostatic hyperplasia without urinary obstruction 04/30/2008  . Synovitis and tenosynovitis 04/30/2008  . HLD (hyperlipidemia) 04/12/2008  . COLONIC POLYPS 11/21/2007  . MI 11/21/2007  . Coronary atherosclerosis 11/21/2007  . GERD 11/21/2007  . Rheumatoid arthritis ([place]) 11/21/2007  . SLEEP APNEA, MILD 11/21/2007    Past Surgical History:  Procedure Laterality Date  . CARDIAC CATHETERIZATION      1996/ 2012 report on chart  . COLONOSCOPY    . HIP ARTHROPLASTY Left 03/04/2019   Procedure: LEFT HIP HEMIARTHROPLASTY;  Surgeon: Mcarthur Rossetti, MD;  Location: [facility];  Service: Orthopedics;  Laterality: Left;  . INGUINAL HERNIA REPAIR  11/04/2011   Procedure: HERNIA REPAIR INGUINAL ADULT;  Surgeon: Pedro Earls, MD;  Location: [facility];  Service: General;  Laterality: Right;  Right Inguinal Hernia Repair with Mesh  . KNEE ARTHROSCOPY Right 1976  . MENISCUS DEBRIDEMENT Left 1996  . TRIGGER FINGER RELEASE Right 10/10/2009  . UMBILICAL HERNIA REPAIR  2009  . WRIST SURGERY Left 04/25/91       Family History  Problem Relation Age of Onset  . Heart disease Mother   . Rheum arthritis Mother   . Emphysema Father     Social History   Tobacco Use  . Smoking status: Former Smoker    Packs/day: 3.00    Years: 20.00    Pack years: 60.00    Types: Cigarettes    Quit date: 11/01/1970    Years since quitting: 49.8  . Smokeless tobacco: Never Used  . Tobacco comment: quit 1967  Vaping Use  . Vaping Use: Never used  Substance Use Topics  . Alcohol use: No  . Drug use: No    Home Medications Prior to Admission medications   Medication Sig Start Date End Date Taking? Authorizing Provider  acetaminophen (TYLENOL) 325 MG tablet Take 650 mg by mouth See admin instructions. Take 2 tablets (650 mg) by mouth three times daily for pain management, may also take 2 tablets (650 mg) every 6 hours as needed for mild pain/fever    [provider]  antiseptic oral rinse (BIOTENE) LIQD 15 mLs by Mouth Rinse route every 6 (six) hours as needed for dry mouth.    [provider]  ascorbic acid (VITAMIN C) 500 MG tablet Take 1,000 mg by mouth daily. COVID prophylaxis    [provider]  b complex vitamins tablet Take 1 tablet by mouth daily.    [provider]  carvedilol (COREG) 6.25 MG tablet TAKE 1 TABLET TWICE A DAY WITH A MEAL. Patient taking  differently: Take 6.25 mg by mouth 2 (two) times daily with a meal.  02/22/18   Burtis Junes, NP  Cholecalciferol (VITAMIN D3) 50 MCG (2000 UT) capsule Take 2,000 Units by mouth daily.     [provider]  Cyanocobalamin (B-12) 500 MCG TABS Take 500 mcg by mouth every evening.    [provider]  docusate sodium (COLACE) 100 MG capsule Take 1 capsule (100 mg total) by mouth 2 (two) times daily. 03/07/19   Regalado, Belkys A, MD  donepezil (ARICEPT) 5 MG tablet Take 5 mg by mouth at bedtime.    [provider]  feeding supplement, ENSURE ENLIVE, (ENSURE ENLIVE) LIQD Take 237 mLs by mouth 2 (two) times daily between meals. Patient not taking: Reported on 03/23/2020 04/07/18   Debbe Odea, MD  furosemide (LASIX) 40 MG tablet Take 1 tablet (40 mg total) by mouth  daily. 03/11/19   Damita Lack, MD  guaiFENesin (MUCINEX) 600 MG 12 hr tablet Take 600 mg by mouth every 12 (twelve) hours.    [provider]  guaiFENesin-dextromethorphan (ROBITUSSIN DM) 100-10 MG/5ML syrup Take 5 mLs by mouth every 4 (four) hours as needed for cough. 10/13/18   Arrien, Jimmy Picket, MD  ipratropium-albuterol (DUONEB) 0.5-2.5 (3) MG/3ML SOLN Take 3 mLs by nebulization every 6 (six) hours as needed (shortness of breath/wheezing).    [provider]  Magnesium 200 MG TABS Take 200 mg by mouth daily.    [provider]  melatonin 3 MG TABS tablet Take 6 mg by mouth at bedtime.    [provider]  mesalamine (LIALDA) 1.2 g EC tablet Take 2.4 g by mouth 2 (two) times daily.     [provider]  nitroGLYCERIN (NITROSTAT) 0.4 MG SL tablet Place 0.4 mg under the tongue every 5 (five) minutes as needed for chest pain.     [provider]  omeprazole (PRILOSEC) 40 MG capsule Take 40 mg by mouth at bedtime.    [provider]  oxybutynin (DITROPAN) 5 MG tablet Take 5 mg by mouth every 12 (twelve) hours.  09/20/18   [provider]    pravastatin (PRAVACHOL) 40 MG tablet Take 1 tablet (40 mg total) by mouth at bedtime. 03/08/18   Belva Crome, MD  predniSONE (DELTASONE) 10 MG tablet Take 10 mg by mouth daily.     [provider]  primidone (MYSOLINE) 50 MG tablet Take 50 mg by mouth at bedtime.    [provider]  sodium fluoride (PREVIDENT 5000 PLUS) 1.1 % CREA dental cream Place 1 application onto teeth See admin instructions. Daily at bedtime: Brush on teeth with a toothbrush after PM mouth care. Spit out excess and do NOT rinse    [provider]  vitamin A 10000 UNIT capsule Take 10,000 Units by mouth daily.     [provider]  vitamin E 400 UNIT capsule Take 400 Units by mouth daily.     [provider]  zinc sulfate 220 (50 Zn) MG capsule Take 220 mg by mouth daily.    [provider]    Allergies    Patient has no known allergies.  Review of Systems   Review of Systems  Unable to perform ROS: Patient unresponsive  Neurological: Positive for weakness.  All other systems reviewed and are negative.   Physical Exam Updated Vital Signs BP 111/79   Pulse 90   Temp (S) 99.1 F (37.3 C) (Rectal)   Resp 15   Ht 6' 1"  (1.854 m)   Wt 81 kg   SpO2 98%   BMI 23.56 kg/m   Physical Exam Vitals and nursing note reviewed.  Constitutional:      Appearance: He is well-developed.  HENT:     Head: Normocephalic and atraumatic.  Eyes:     Conjunctiva/sclera: Conjunctivae normal.  Cardiovascular:     Rate and Rhythm: Normal rate and regular rhythm.     Heart sounds: No murmur heard.   Pulmonary:     Effort: Pulmonary effort is normal. No respiratory distress.     Breath sounds: Normal breath sounds.  Abdominal:     Palpations: Abdomen is soft.     Tenderness: There is no abdominal tenderness.  Musculoskeletal:        General: Normal range of motion.     Cervical back: Neck supple.  Skin:  General: Skin is warm and dry.  Neurological:     General:  No focal deficit present.     Mental Status: He is alert.  Psychiatric:        Mood and Affect: Mood normal.     Comments: Arouses to physical stimulus.  Opens eyes,  Pt does not answer questions      ED Results / Procedures / Treatments   Labs (all labs ordered are listed, but only abnormal results are displayed) Labs Reviewed  CBC WITH DIFFERENTIAL/PLATELET - Abnormal; Notable for the following components:      Result Value   WBC 15.1 (*)    Neutro Abs 13.4 (*)    Abs Immature Granulocytes 0.22 (*)    All other components within normal limits  COMPREHENSIVE METABOLIC PANEL - Abnormal; Notable for the following components:   Glucose, Bld 372 (*)    BUN 40 (*)    Creatinine, Ser 1.34 (*)    Albumin 2.7 (*)    GFR calc non Af Amer 48 (*)    GFR calc Af Amer 56 (*)    Anion gap 16 (*)    All other components within normal limits  LACTIC ACID, PLASMA - Abnormal; Notable for the following components:   Lactic Acid, Venous 2.9 (*)    All other components within normal limits  PROTIME-INR - Abnormal; Notable for the following components:   Prothrombin Time 15.6 (*)    INR 1.3 (*)    All other components within normal limits  CBG MONITORING, ED - Abnormal; Notable for the following components:   Glucose-Capillary 355 (*)    All other components within normal limits  CULTURE, BLOOD (ROUTINE X 2)  CULTURE, BLOOD (ROUTINE X 2)  APTT  LACTIC ACID, PLASMA    EKG EKG Interpretation  Date/Time:  Monday August 19 2020 14:11:10 EDT Ventricular Rate:  94 PR Interval:    QRS Duration: 108 QT Interval:  395 QTC Calculation: 494 R Axis:   -80 Text Interpretation: Sinus rhythm Ventricular premature complex Left anterior fascicular block Low voltage, precordial leads Abnormal R-wave progression, late transition Borderline prolonged QT interval Confirmed by Sherwood Gambler [phone]) on 08/19/2020 2:16:38 PM   Radiology DG Chest Port 1 View  Result Date: 08/19/2020 CLINICAL DATA:   Weakness EXAM: PORTABLE CHEST 1 VIEW COMPARISON:  07/25/2020 FINDINGS: Cardiac silhouette is within normal limits and is accentuated by low lung volumes and portable technique. Aortic atherosclerosis. Low lung volumes with bibasilar opacities. No visible pleural effusions. No pneumothorax. No acute osseous abnormality. IMPRESSION: Bibasilar opacities, which may represent atelectasis, aspiration and/or pneumonia. Electronically Signed   By: Margaretha Sheffield MD   On: 08/19/2020 15:01    Procedures .Critical Care Performed by: Fransico Meadow, PA-C Authorized by: Fransico Meadow, PA-C   Critical care provider statement:    Critical care time (minutes):  45   Critical care start time:  08/19/2020 1:00 PM   Critical care end time:  08/19/2020 5:48 PM   Critical care was time spent personally by me on the following activities:  Discussions with consultants, evaluation of patient's response to treatment, examination of patient, ordering and performing treatments and interventions, ordering and review of laboratory studies, ordering and review of radiographic studies, pulse oximetry, re-evaluation of patient's condition, obtaining history from patient or surrogate and review of old charts   (including critical care time)  Medications Ordered in ED Medications  lactated ringers bolus 1,000 mL (1,000 mLs Intravenous New Bag/Given 08/19/20 1540)  And  lactated ringers bolus 1,000 mL (1,000 mLs Intravenous New Bag/Given 08/19/20 1539)    And  lactated ringers bolus 500 mL (has no administration in time range)  cefTRIAXone (ROCEPHIN) 1 g in sodium chloride 0.9 % 100 mL IVPB (has no administration in time range)  azithromycin (ZITHROMAX) 500 mg in sodium chloride 0.9 % 250 mL IVPB (500 mg Intravenous New Bag/Given 08/19/20 1640)    ED Course  I have reviewed the triage vital signs and the nursing notes.  Pertinent labs & imaging results that were available during my care of the patient were reviewed by  me and considered in my medical decision making (see chart for details).    MDM Rules/Calculators/A&P                          MDM:  Pt given IV fluid bolus,  Pt more responsive after bolus, Chest xray shows pneumonia, lactic acid is elevated.   Hospitalist consulted for  Admission  Final Clinical Impression(s) / ED Diagnoses Final diagnoses:  Community acquired pneumonia, unspecified laterality    Rx / DC Orders ED Discharge Orders    None       Fransico Meadow, PA-C 08/19/20 1749    Fransico Meadow, PA-C 08/19/20 1750    Sherwood Gambler, MD 08/20/20 [phone]

## 2020-08-19 NOTE — ED Notes (Signed)
Attempted report to [id number]. RN to review chart with charge to ensure pt is appropriate for floor

## 2020-08-19 NOTE — H&P (Addendum)
History and Physical    Anthony [name]:[id number] DOB: June 21, 1934 DOA: 08/19/2020  PCP: Josetta Huddle, MD   Chief Complaint: Worsening mental status  HPI: Anthony [name] is a 86 y.o. male with medical history significant for rheumatoid arthritis, systolic heart failure, CAD, Crohn's disease, dementia, GERD, hyperlipidemia, as well as recent Covid infection more than 21 days ago here at [facility] with moderate improvement but general decline at SNF over the past few weeks.  Patient was evaluated at SNF and found to be less responsive than previous baseline although given his worsening status and baseline dementia it is unclear whether or not he had any acute changes rather than general decline.  Daughter was contacted for further information and indicates no acute changes to the best of her knowledge but simply a further decline in patient's status and poor p.o. intake over the past few days.  ED Course: In the ED patient was evaluated noted to be afebrile, satting 99 to 100% on 2 L nasal cannula which was his discharge oxygen level from previous hospitalization.  His labs remarkable for mild leukocytosis of 15.1 with elevated lactic acidosis at 2.9 and elevated creatinine at 1.3 with baseline creatinine previously around 0.9.  Chest x-ray was concerning for pneumonia and hospitalist was called to admit for community-acquired pneumonia in the setting of sepsis and mental status changes.  Review of Systems: Limited given patient's mental status   Assessment/Plan Active Problems:   Sepsis due to pneumonia ([place])   Acute metabolic encephalopathy, likely multifactorial in the setting of below sepsis, dementia, failure to thrive - Continue to follow clinically, hoping patient's mental status and interactive state improves with antibiotics and supportive care as below - Patient will be n.p.o. until speech can further evaluate given concern for aspiration as below - Hold home medications  concern for polypharmacy certainly reasonable given patient's advanced age  Sepsis secondary to community-acquired pneumonia versus HCAP pneumonia, patient has been at [facility] facility for 20 days and has completed Covid quarantine Likely post viral bacterial pneumonia, cannot rule out aspiration pneumonia versus pneumonitis Concurrent thrush - Patient meets criteria given leukocytosis, tachycardia, tachypnea with suspected source of bacterial pneumonia with likely concurrent thrush - We will escalate antibiotics to Zosyn given concern for aspiration as well as recent hospitalization and nursing home residence ; MRSA previously negative - Continue supportive care, nebs, steroids, incentive spirometry and flutter as able to participate  - We will have speech evaluate for possible aspiration, n.p.o. until speech can evaluate as above.    Lactic acidosis in the setting of above -Likely multifactorial given dehydration, AKI, sepsis -Follow per protocol -Patient already received sepsis protocol bolus  AKI in setting of poor p.o. intake, failure to thrive Severe protein caloric malnutrition -Continue IV fluids, monitor closely given history of systolic heart failure -We will have nutrition follow given patient's profound worsening p.o. status and mental status over the past few weeks -Would not recommend NG or PEG tube feeding given patient's advanced age and high risk for aspiration   DVT prophylaxis: heparin  Code Status: DNR  Family Communication: Daughter at bedside  Status is: Inpt  Dispo: The patient is from: Home              Anticipated d/c is to: SNF              Anticipated d/c date is: 48 to 72 hours              Patient currently  not medically stable for discharge given ongoing sepsis criteria, mental status changes from baseline, failure to thrive, markedly poor p.o. intake and need for IV antibiotics  Consultants:   None  Procedures:   None planned   Past Medical History:   Diagnosis Date  . Arthritis    rheumatoid-  Dr Barkley Boards  . CHF (congestive heart failure) (HCC)    chronic systolic heart failure per Dr [name] L[facility] note 09/23/11.   EKG 4/12, stress test  and cath from 8/12 on chart.  Clearance with LOV Dr [name] on chart  . Coronary artery disease   . Crohn disease ([place])   . Dementia ([place])   . Dysrhythmia    nonsustained,asymptomatic VT per Dr [name] office note  resulting in cath  . GERD (gastroesophageal reflux disease)   . Heart attack ([place]) 1996, 06/2011  . Hyperlipidemia   . Hypertension   . Pneumonia 1993  . Prostate troubles    states take alternating meds for bladder/prostate control- "age thing"  . Sleep apnea    sleep study years ago- has apnea but did not qualify for CPAP    Past Surgical History:  Procedure Laterality Date  . CARDIAC CATHETERIZATION     1996/ 2012 report on chart  . COLONOSCOPY    . HIP ARTHROPLASTY Left 03/04/2019   Procedure: LEFT HIP HEMIARTHROPLASTY;  Surgeon: Mcarthur Rossetti, MD;  Location: [place];  Service: Orthopedics;  Laterality: Left;  . INGUINAL HERNIA REPAIR  11/04/2011   Procedure: HERNIA REPAIR INGUINAL ADULT;  Surgeon: Pedro Earls, MD;  Location: [facility];  Service: General;  Laterality: Right;  Right Inguinal Hernia Repair with Mesh  . KNEE ARTHROSCOPY Right 1976  . MENISCUS DEBRIDEMENT Left 1996  . TRIGGER FINGER RELEASE Right 10/10/2009  . UMBILICAL HERNIA REPAIR  2009  . WRIST SURGERY Left 04/25/91     reports that he quit smoking about 49 years ago. His smoking use included cigarettes. He has a 60.00 pack-year smoking history. He has never used smokeless tobacco. He reports that he does not drink alcohol and does not use drugs.  No Known Allergies  Family History  Problem Relation Age of Onset  . Heart disease Mother   . Rheum arthritis Mother   . Emphysema Father     Prior to Admission medications   Medication Sig Start Date End Date Taking? Authorizing Provider  acetaminophen  (TYLENOL) 325 MG tablet Take 650 mg by mouth See admin instructions. Take 2 tablets (650 mg) by mouth three times daily for pain management, may also take 2 tablets (650 mg) every 6 hours as needed for mild pain/fever    [provider]  antiseptic oral rinse (BIOTENE) LIQD 15 mLs by Mouth Rinse route every 6 (six) hours as needed for dry mouth.    [provider]  ascorbic acid (VITAMIN C) 500 MG tablet Take 1,000 mg by mouth daily. COVID prophylaxis    [provider]  b complex vitamins tablet Take 1 tablet by mouth daily.    [provider]  carvedilol (COREG) 6.25 MG tablet TAKE 1 TABLET TWICE A DAY WITH A MEAL. Patient taking differently: Take 6.25 mg by mouth 2 (two) times daily with a meal.  02/22/18   Burtis Junes, NP  Cholecalciferol (VITAMIN D3) 50 MCG (2000 UT) capsule Take 2,000 Units by mouth daily.     [provider]  Cyanocobalamin (B-12) 500 MCG TABS Take 500 mcg by mouth every evening.  [provider]  docusate sodium (COLACE) 100 MG capsule Take 1 capsule (100 mg total) by mouth 2 (two) times daily. 03/07/19   Regalado, Belkys A, MD  donepezil (ARICEPT) 5 MG tablet Take 5 mg by mouth at bedtime.    [provider]  feeding supplement, ENSURE ENLIVE, (ENSURE ENLIVE) LIQD Take 237 mLs by mouth 2 (two) times daily between meals. Patient not taking: Reported on 03/23/2020 04/07/18   Debbe Odea, MD  furosemide (LASIX) 40 MG tablet Take 1 tablet (40 mg total) by mouth daily. 03/11/19   Amin, Jeanella Flattery, MD  guaiFENesin (MUCINEX) 600 MG 12 hr tablet Take 600 mg by mouth every 12 (twelve) hours.    [provider]  guaiFENesin-dextromethorphan (ROBITUSSIN DM) 100-10 MG/5ML syrup Take 5 mLs by mouth every 4 (four) hours as needed for cough. 10/13/18   Arrien, Jimmy Picket, MD  ipratropium-albuterol (DUONEB) 0.5-2.5 (3) MG/3ML SOLN Take 3 mLs by nebulization every 6 (six) hours as needed (shortness of  breath/wheezing).    [provider]  Magnesium 200 MG TABS Take 200 mg by mouth daily.    [provider]  melatonin 3 MG TABS tablet Take 6 mg by mouth at bedtime.    [provider]  mesalamine (LIALDA) 1.2 g EC tablet Take 2.4 g by mouth 2 (two) times daily.     [provider]  nitroGLYCERIN (NITROSTAT) 0.4 MG SL tablet Place 0.4 mg under the tongue every 5 (five) minutes as needed for chest pain.     [provider]  omeprazole (PRILOSEC) 40 MG capsule Take 40 mg by mouth at bedtime.    [provider]  oxybutynin (DITROPAN) 5 MG tablet Take 5 mg by mouth every 12 (twelve) hours.  09/20/18   [provider]  pravastatin (PRAVACHOL) 40 MG tablet Take 1 tablet (40 mg total) by mouth at bedtime. 03/08/18   Belva Crome, MD  predniSONE (DELTASONE) 10 MG tablet Take 10 mg by mouth daily.     [provider]  primidone (MYSOLINE) 50 MG tablet Take 50 mg by mouth at bedtime.    [provider]  sodium fluoride (PREVIDENT 5000 PLUS) 1.1 % CREA dental cream Place 1 application onto teeth See admin instructions. Daily at bedtime: Brush on teeth with a toothbrush after PM mouth care. Spit out excess and do NOT rinse    [provider]  vitamin A 10000 UNIT capsule Take 10,000 Units by mouth daily.     [provider]  vitamin E 400 UNIT capsule Take 400 Units by mouth daily.     [provider]  zinc sulfate 220 (50 Zn) MG capsule Take 220 mg by mouth daily.    [provider]    Physical Exam: Vitals:   08/19/20 1630 08/19/20 1642 08/19/20 1700 08/19/20 1730  BP:  111/79 101/61 107/67  Pulse: 86 90 86 82  Resp: (!) 22 15 20 12   Temp:      TempSrc:      SpO2: 99% 98% 97% 99%  Weight:      Height:        Constitutional: NAD, calm, comfortable Vitals:   08/19/20 1630 08/19/20 1642 08/19/20 1700 08/19/20 1730  BP:  111/79 101/61 107/67  Pulse: 86 90 86 82  Resp: (!) 22 15 20  12   Temp:      TempSrc:      SpO2: 99% 98% 97% 99%  Weight:  Height:       General:  Pleasantly resting in bed, No acute distress. HEENT:  Normocephalic atraumatic.  Sclerae nonicteric, noninjected.  Extraocular movements intact bilaterally. Neck:  Without mass or deformity.  Trachea is midline. Lungs:  Moderate course breath sounds bibasilar - without overt rales. Heart:  Regular rate and rhythm.  Without murmurs, rubs, or gallops. Abdomen:  Soft, nontender, nondistended.  Without guarding or rebound. Extremities: Without cyanosis, clubbing, edema, or obvious deformity. Vascular:  Dorsalis pedis and posterior tibial pulses palpable bilaterally. Skin:  Cool and dry, no erythema, no ulcerations.  Labs on Admission: I have personally reviewed following labs and imaging studies  CBC: Recent Labs  Lab 08/19/20 1549  WBC 15.1*  NEUTROABS 13.4*  HGB 16.6  HCT 51.4  MCV 91.8  PLT 562   Basic Metabolic Panel: Recent Labs  Lab 08/19/20 1549  NA 139  K 3.7  CL 98  CO2 25  GLUCOSE 372*  BUN 40*  CREATININE 1.34*  CALCIUM 9.3   GFR: Estimated Creatinine Clearance: 46.4 mL/min (A) (by C-G formula based on SCr of 1.34 mg/dL (H)). Liver Function Tests: Recent Labs  Lab 08/19/20 1549  AST 21  ALT 25  ALKPHOS 97  BILITOT 1.0  PROT 6.8  ALBUMIN 2.7*   No results for input(s): LIPASE, AMYLASE in the last 168 hours. No results for input(s): AMMONIA in the last 168 hours. Coagulation Profile: Recent Labs  Lab 08/19/20 1549  INR 1.3*   Cardiac Enzymes: No results for input(s): CKTOTAL, CKMB, CKMBINDEX, TROPONINI in the last 168 hours. BNP (last 3 results) No results for input(s): PROBNP in the last 8760 hours. HbA1C: No results for input(s): HGBA1C in the last 72 hours. CBG: Recent Labs  Lab 08/19/20 1542  GLUCAP 355*   Lipid Profile: No results for input(s): CHOL, HDL, LDLCALC, TRIG, CHOLHDL, LDLDIRECT in the last 72 hours. Thyroid Function Tests: No  results for input(s): TSH, T4TOTAL, FREET4, T3FREE, THYROIDAB in the last 72 hours. Anemia Panel: No results for input(s): VITAMINB12, FOLATE, FERRITIN, TIBC, IRON, RETICCTPCT in the last 72 hours. Urine analysis:    Component Value Date/Time   COLORURINE YELLOW 03/23/2020 1507   APPEARANCEUR CLEAR 03/23/2020 1507   LABSPEC >1.046 (H) 03/23/2020 1507   PHURINE 6.0 03/23/2020 1507   GLUCOSEU NEGATIVE 03/23/2020 1507   HGBUR NEGATIVE 03/23/2020 1507   BILIRUBINUR NEGATIVE 03/23/2020 1507   KETONESUR 5 (A) 03/23/2020 1507   PROTEINUR NEGATIVE 03/23/2020 1507   UROBILINOGEN 0.2 11/05/2014 0303   NITRITE NEGATIVE 03/23/2020 1507   LEUKOCYTESUR NEGATIVE 03/23/2020 1507    Radiological Exams on Admission: DG Chest Port 1 View  Result Date: 08/19/2020 CLINICAL DATA:  Weakness EXAM: PORTABLE CHEST 1 VIEW COMPARISON:  07/25/2020 FINDINGS: Cardiac silhouette is within normal limits and is accentuated by low lung volumes and portable technique. Aortic atherosclerosis. Low lung volumes with bibasilar opacities. No visible pleural effusions. No pneumothorax. No acute osseous abnormality. IMPRESSION: Bibasilar opacities, which may represent atelectasis, aspiration and/or pneumonia. Electronically Signed   By: Margaretha Sheffield MD   On: 08/19/2020 15:01    EKG: Independently reviewed.  Low voltage normal sinus rhythm with borderline prolonged QT otherwise without ST elevation or depression.   Little Ishikawa DO [facility] For contact please use secure messenger on Epic  If 7PM-7AM, please contact night-coverage located on [email or web address]   08/19/2020, 5:57 PM

## 2020-08-19 NOTE — ED Notes (Signed)
Difficulty obtaining blood work from pt. Phlebotomist requested for assistance

## 2020-08-20 ENCOUNTER — Inpatient Hospital Stay (HOSPITAL_COMMUNITY): Payer: PPO

## 2020-08-20 DIAGNOSIS — A419 Sepsis, unspecified organism: Secondary | ICD-10-CM | POA: Diagnosis not present

## 2020-08-20 DIAGNOSIS — G9341 Metabolic encephalopathy: Secondary | ICD-10-CM

## 2020-08-20 DIAGNOSIS — J189 Pneumonia, unspecified organism: Secondary | ICD-10-CM | POA: Diagnosis not present

## 2020-08-20 DIAGNOSIS — E46 Unspecified protein-calorie malnutrition: Secondary | ICD-10-CM

## 2020-08-20 DIAGNOSIS — R627 Adult failure to thrive: Secondary | ICD-10-CM

## 2020-08-20 LAB — CREATININE, SERUM
Creatinine, Ser: 1.37 mg/dL — ABNORMAL HIGH (ref 0.61–1.24)
GFR calc Af Amer: 55 mL/min — ABNORMAL LOW (ref >60)
GFR calc non Af Amer: 47 mL/min — ABNORMAL LOW (ref >60)

## 2020-08-20 LAB — GLUCOSE, CAPILLARY
Glucose-Capillary: 316 mg/dL — ABNORMAL HIGH (ref 70–99)
Glucose-Capillary: 355 mg/dL — ABNORMAL HIGH (ref 70–99)
Glucose-Capillary: 377 mg/dL — ABNORMAL HIGH (ref 70–99)
Glucose-Capillary: 440 mg/dL — ABNORMAL HIGH (ref 70–99)

## 2020-08-20 LAB — HIV ANTIBODY (ROUTINE TESTING W REFLEX): HIV Screen 4th Generation wRfx: NONREACTIVE

## 2020-08-20 MED ORDER — RESOURCE THICKENUP CLEAR PO POWD
ORAL | Status: DC | PRN
  Filled 2020-08-20: qty 125

## 2020-08-20 MED ORDER — GUAIFENESIN-DM 100-10 MG/5ML PO SYRP
5.0000 mL | ORAL_SOLUTION | ORAL | Status: DC | PRN
  Administered 2020-08-21: 5 mL via ORAL
  Filled 2020-08-20: qty 10

## 2020-08-20 MED ORDER — ADULT MULTIVITAMIN W/MINERALS CH
1.0000 | ORAL_TABLET | Freq: Every day | ORAL | Status: DC
  Administered 2020-08-20 – 2020-08-27 (×8): 1 via ORAL
  Filled 2020-08-20 (×8): qty 1

## 2020-08-20 MED ORDER — NYSTATIN 100000 UNIT/ML MT SUSP
5.0000 mL | Freq: Four times a day (QID) | OROMUCOSAL | Status: DC
  Administered 2020-08-20 – 2020-08-27 (×28): 500000 [IU] via ORAL
  Filled 2020-08-20 (×25): qty 5

## 2020-08-20 MED ORDER — HEPARIN SODIUM (PORCINE) 5000 UNIT/ML IJ SOLN
5000.0000 [IU] | Freq: Three times a day (TID) | INTRAMUSCULAR | Status: DC
  Administered 2020-08-20 – 2020-08-27 (×21): 5000 [IU] via SUBCUTANEOUS
  Filled 2020-08-20 (×21): qty 1

## 2020-08-20 MED ORDER — NEPRO/CARBSTEADY PO LIQD
237.0000 mL | Freq: Three times a day (TID) | ORAL | Status: DC
  Administered 2020-08-20 – 2020-08-27 (×17): 237 mL via ORAL
  Filled 2020-08-20: qty 237

## 2020-08-20 MED ORDER — INSULIN ASPART 100 UNIT/ML ~~LOC~~ SOLN
0.0000 [IU] | Freq: Three times a day (TID) | SUBCUTANEOUS | Status: DC
  Administered 2020-08-21: 15 [IU] via SUBCUTANEOUS

## 2020-08-20 MED ORDER — LORAZEPAM 2 MG/ML IJ SOLN
0.5000 mg | Freq: Once | INTRAMUSCULAR | Status: AC
  Administered 2020-08-20: 0.5 mg via INTRAVENOUS
  Filled 2020-08-20: qty 1

## 2020-08-20 NOTE — Plan of Care (Signed)
  Problem: Education: Goal: Knowledge of General Education information will improve Description: Including pain rating scale, medication(s)/side effects and non-pharmacologic comfort measures Outcome: Progressing   Problem: Health Behavior/Discharge Planning: Goal: Ability to manage health-related needs will improve Outcome: Progressing   Problem: Clinical Measurements: Goal: Ability to maintain clinical measurements within normal limits will improve Outcome: Progressing   Problem: Coping: Goal: Level of anxiety will decrease Outcome: Progressing   Problem: Safety: Goal: Ability to remain free from injury will improve Outcome: Progressing   Problem: Skin Integrity: Goal: Risk for impaired skin integrity will decrease Outcome: Progressing

## 2020-08-20 NOTE — Progress Notes (Addendum)
Initial Nutrition Assessment  RD working remotely.  DOCUMENTATION CODES:   Not applicable  INTERVENTION:   -MVI with minerals daily -Feeding assistance with meals -Magic cup TID with meals, each supplement provides 290 kcal and 9 grams of protein -Hormel Shake TID with meals, each supplement provides 520 kcals and 22 grams protein -Nepro Shake po TID, each supplement provides 425 kcal and 19 grams protein  NUTRITION DIAGNOSIS:   Inadequate oral intake related to inability to eat as evidenced by NPO status.  GOAL:   Patient will meet greater than or equal to 90% of their needs  MONITOR:   Diet advancement, Labs, Weight trends, Skin, I & O's  REASON FOR ASSESSMENT:   Consult Assessment of nutrition requirement/status  ASSESSMENT:   Anthony [name] is a 86 y.o. male with medical history significant for rheumatoid arthritis, systolic heart failure, CAD, Crohn's disease, dementia, GERD, hyperlipidemia, as well as recent Covid infection more than 21 days ago here at [facility] with moderate improvement but general decline at SNF over the past few weeks.  Pt admitted with acute metabolic encephalopathy secondary to sepsis secondary to CAP, dementia, and FTT.   Reviewed I/O's: +236 ml x 24 hours  UOP: 350 ml x 24 hours  Attempted to speak with pt via call to hospital room phone, however, unable to reach.   Pt currently NPO. Per H&P, pt was recently hospitalized with COVID-19 infection and intake has declined since discharge to SNF. Per PTA medication records, pt was ordered Ensure Enlive. SLP evaluation pending.   Reviewed wt hx; wt has been stable over the past month. Per wt hx, UBW around 176#. Suspect wt reading from 03/25/20 is an outlier.   ADDNEDDUM: Pt s/p MBSS; advanced to dysphagia 1 diet with nectar thick liquids.   Medications reviewed and include IV solu-medrol and lactated ringers infusion @ 75 ml/hr.   Lab Results  Component Value Date   HGBA1C 7.2 (H)  07/26/2020   PTA DM medications are none. Per ADA's Standards of Medical Care of Diabetes, glycemic targets for older adults who have multiple co-morbidities, cognitive impairments, and functional dependence should be less stringent (Hgb A1c <8.0-8.5).    Labs reviewed: CBGS: 255-316 (inpatient orders for glycemic control are 0-6 units insulin aspart TID).   Diet Order:   Diet Order            Diet NPO time specified  Diet effective now                 EDUCATION NEEDS:   Not appropriate for education at this time  Skin:  Skin Assessment: Reviewed RN Assessment  Last BM:  Unknown  Height:   Ht Readings from Last 1 Encounters:  08/19/20 6' 1"  (1.854 m)    Weight:   Wt Readings from Last 1 Encounters:  08/19/20 81 kg    Ideal Body Weight:  83.6 kg  BMI:  Body mass index is 23.56 kg/m.  Estimated Nutritional Needs:   Kcal:  2000-2200  Protein:  105-120 grams  Fluid:  > 2 L    Loistine Chance, RD, LDN, [place] Registered Dietitian II Certified Diabetes Care and Education Specialist Please refer to [facility] for RD and/or RD on-call/weekend/after hours pager

## 2020-08-20 NOTE — Progress Notes (Signed)
Inpatient Diabetes Program Recommendations  AACE/ADA: New Consensus Statement on Inpatient Glycemic Control (2015)  Target Ranges:  Prepandial:   less than 140 mg/dL      Peak postprandial:   less than 180 mg/dL (1-2 hours)      Critically ill patients:  140 - 180 mg/dL   Lab Results  Component Value Date   GLUCAP 355 (H) 08/20/2020   HGBA1C 7.2 (H) 07/26/2020    Review of Glycemic Control Results for DAEKWON, BESWICK" (MRN [id number]) as of 08/20/2020 10:54  Ref. Range 08/19/2020 15:42 08/20/2020 00:15 08/20/2020 06:45  Glucose-Capillary Latest Ref Range: 70 - 99 mg/dL 355 (H) 316 (H) 355 (H)   Diabetes history: Type 2 DM Outpatient Diabetes medications: none Current orders for Inpatient glycemic control: Novolog 0-6 units TID Solumedrol 40 mg BID  Inpatient Diabetes Program Recommendations:    Consider also adding Levemir 8 units BID.   Thanks, Bronson Curb, MSN, RNC-OB Diabetes Coordinator [phone] (8a-5p)

## 2020-08-20 NOTE — Progress Notes (Signed)
Modified Barium Swallow Progress Note  Patient Details  Name: Anthony [name] MRN: [id number] Date of Birth: 30-May-1934  Today's Date: 08/20/2020  Modified Barium Swallow completed.  Full report located under Chart Review in the Imaging Section.  Brief recommendations include the following:  Clinical Impression  MBS revealed moderate oropharyngeal with pt exhibiting delayed oral transit, premature spillage, vallecular residue and inadequate laryngeal/airway closure, causing several instances of silent aspiration before, during, and after swallow. Pt exhibited increased lethargy throughout the study and required cueing to continue participation. Silent aspiration occurred across POs of thin liquid before, during, and after the swallow. Given cueing to clear his throat, he produced a weak and ineffective cough. SLP attempted chin tuck with thin liquid, but pt was unable to execute maneuver and silent aspiration occurred before the swallow. No aspiration was observed with cup sips of nectar, however, silent aspiration of nectar was observed before the swallow due to increased volume and flow with straw. No observation of aspiration occurred with puree, but mild vallecular residue was observed. Pt demonstrated delayed oral transit and increased difficulty initiating a swallow wih a solid and the swallow was not captured by MBS. Recommend dys 1 (puree) diet and nectar thin liquids via cup (no straw). Pt should also utilize small bites/sips.   Swallow Evaluation Recommendations       SLP Diet Recommendations: Dysphagia 1 (Puree) solids;Nectar thick liquid   Liquid Administration via: No straw;Cup   Medication Administration: Crushed with puree   Supervision: Full supervision/cueing for compensatory strategies   Compensations: Small sips/bites   Postural Changes: Seated upright at 90 degrees   Oral Care Recommendations: Oral care BID        Greggory Keen 08/20/2020,11:40 AM

## 2020-08-20 NOTE — Progress Notes (Signed)
Patient's daughter is at the bedside.  Patient is alert and oriented x 1.  Patient's daughter is requesting Ativan 0.5 mg PO BID.  She stated that patient takes this medication every day and it helps him sleep.  She stated he takes it at 4 pm and at bedtime.  Sent message to on call MD through [facility].

## 2020-08-20 NOTE — Progress Notes (Addendum)
PROGRESS NOTE    Anthony [name]  VHQ:[id number] DOB: Dec 10, 1933 DOA: 08/19/2020 PCP: Josetta Huddle, MD   Brief Narrative:  Anthony [name] is a 86 y.o. male with medical history significant for rheumatoid arthritis, systolic heart failure, CAD, Crohn's disease, dementia, GERD, hyperlipidemia, as well as recent Covid infection more than 21 days ago here at [facility] with moderate improvement but general decline at SNF over the past few weeks.  Patient was evaluated at SNF and found to be less responsive than previous baseline although given his worsening status and baseline dementia it is unclear whether or not he had any acute changes rather than general decline.  Daughter was contacted for further information and indicates no acute changes to the best of her knowledge but simply a further decline in patient's status and poor p.o. intake over the past few days. In the ED patient was evaluated noted to be afebrile, satting 99 to 100% on 2 L nasal cannula which was his discharge oxygen level from previous hospitalization.  His labs remarkable for mild leukocytosis of 15.1 with elevated lactic acidosis at 2.9 and elevated creatinine at 1.3 with baseline creatinine previously around 0.9.  Chest x-ray was concerning for pneumonia and hospitalist was called to admit for community-acquired pneumonia in the setting of sepsis and mental status changes.    Assessment & Plan:   Principal Problem:   Sepsis due to pneumonia [facility]) Active Problems:   Benign prostatic hyperplasia without urinary obstruction   Chronic combined systolic and diastolic heart failure (HCC)   Benign essential HTN   Thrush   Acute metabolic encephalopathy   Failure to thrive in adult   Unspecified protein-calorie malnutrition (HCC)   Acute metabolic encephalopathy, likely multifactorial in the setting of below sepsis, dementia, failure to thrive, improving - Mental status continues to improve, not yet back to baseline but  much more awake and alert this morning per family - Speech following, n.p.o. until cleared for safe p.o. intake -Daughter at bedside indicates patient does have episodes of coughing meals and while drinking - Hold home medications concern for polypharmacy certainly reasonable given patient's advanced age  Sepsis secondary to community-acquired pneumonia versus HCAP pneumonia, patient has been at [facility] facility for 20 days and has completed Covid quarantine Likely post viral bacterial pneumonia, cannot rule out aspiration pneumonia versus pneumonitis Concurrent thrush - Patient meets criteria given leukocytosis, tachycardia, tachypnea with suspected source of bacterial pneumonia with likely concurrent thrush - We will escalate antibiotics to Zosyn given concern for aspiration as well as recent hospitalization and nursing home residence ; MRSA previously negative - Nystatin swish and swallow ongoing - Continue supportive care, nebs, steroids, incentive spirometry and flutter as able to participate  - We will have speech evaluate for possible aspiration, n.p.o. until speech can evaluate as above.    Lactic acidosis in the setting of above -Likely multifactorial given dehydration, AKI, sepsis    Component Value Date/Time   LATICACIDVEN 2.3 ([place]) 08/19/2020 2217  -Patient already received sepsis protocol bolus - improving as expected  AKI in setting of poor p.o. intake, failure to thrive, improving Severe protein caloric malnutrition -Continue IV fluids, monitor closely given history of systolic heart failure -We will have nutrition follow given patient's profound worsening p.o. status and mental status over the past few weeks -Would not recommend NG or PEG tube feeding given patient's advanced age and high risk for aspiration Lab Results  Component Value Date   CREATININE 1.05 08/19/2020   CREATININE  1.34 (H) 08/19/2020   CREATININE 1.01 07/29/2020    DVT prophylaxis: heparin  Code  Status: DNR  Family Communication: Daughter at bedside  Status is: Inpt  Dispo: The patient is from: SNF  Anticipated d/c is to: SNF  Anticipated d/c date is: 48 to 72 hours  Patient currently not medically stable for discharge given ongoing sepsis criteria, mental status changes from baseline, failure to thrive, markedly poor p.o. intake and need for IV antibiotics  Consultants:   None  Procedures:   None planned  Antimicrobials:  Zosyn  Subjective: No acute issues or events overnight, patient improving drastically over the past 24 hours, much more awake and alert per family, patient denies nausea, vomiting, diarrhea, constipation, headache, fevers, chills.  He does report sore throat in the setting of thrush and difficulty swallowing.  Objective: Vitals:   08/19/20 2123 08/20/20 0050 08/20/20 0336 08/20/20 0743  BP: 117/74 115/60 102/66 106/66  Pulse: 80 80 84 91  Resp: 19 17 18 16   Temp: 98.4 F (36.9 C) 98.4 F (36.9 C) 97.6 F (36.4 C) 97.6 F (36.4 C)  TempSrc: Oral Oral Oral Oral  SpO2: 96% 97% 94% 94%  Weight:      Height:        Intake/Output Summary (Last 24 hours) at 08/20/2020 1156 Last data filed at 08/20/2020 0500 Gross per 24 hour  Intake 586.25 ml  Output 350 ml  Net 236.25 ml   Filed Weights   08/19/20 1421  Weight: 81 kg    Examination:  General: Cachectic appearing elderly gentleman, pleasantly resting in bed, No acute distress. HEENT: Notable thrush in posterior oropharynx, normocephalic atraumatic.  Sclerae nonicteric, noninjected.  Extraocular movements intact bilaterally. Neck:  Without mass or deformity.  Trachea is midline. Lungs:  Clear to auscultate bilaterally without rhonchi, wheeze, or rales. Heart:  Regular rate and rhythm.  Without murmurs, rubs, or gallops. Abdomen:  Soft, nontender, nondistended.  Without guarding or rebound. Extremities: Without cyanosis, clubbing, edema, or obvious  deformity. Vascular:  Dorsalis pedis and posterior tibial pulses palpable bilaterally. Skin:  Warm and dry, no erythema, no ulcerations.  Data Reviewed: I have personally reviewed following labs and imaging studies  CBC: Recent Labs  Lab 08/19/20 1549 08/19/20 2241  WBC 15.1* 13.1*  NEUTROABS 13.4*  --   HGB 16.6 14.5  HCT 51.4 45.3  MCV 91.8 90.4  PLT 291 814   Basic Metabolic Panel: Recent Labs  Lab 08/19/20 1549 08/19/20 2241  NA 139 138  K 3.7 3.6  CL 98 99  CO2 25 26  GLUCOSE 372* 327*  BUN 40* 33*  CREATININE 1.34* 1.05  CALCIUM 9.3 8.5*   GFR: Estimated Creatinine Clearance: 59.2 mL/min (by C-G formula based on SCr of 1.05 mg/dL). Liver Function Tests: Recent Labs  Lab 08/19/20 1549  AST 21  ALT 25  ALKPHOS 97  BILITOT 1.0  PROT 6.8  ALBUMIN 2.7*   No results for input(s): LIPASE, AMYLASE in the last 168 hours. No results for input(s): AMMONIA in the last 168 hours. Coagulation Profile: Recent Labs  Lab 08/19/20 1549  INR 1.3*   Cardiac Enzymes: No results for input(s): CKTOTAL, CKMB, CKMBINDEX, TROPONINI in the last 168 hours. BNP (last 3 results) No results for input(s): PROBNP in the last 8760 hours. HbA1C: No results for input(s): HGBA1C in the last 72 hours. CBG: Recent Labs  Lab 08/19/20 1542 08/20/20 0015 08/20/20 0645 08/20/20 1139  GLUCAP 355* 316* 355* 377*   Lipid Profile:  No results for input(s): CHOL, HDL, LDLCALC, TRIG, CHOLHDL, LDLDIRECT in the last 72 hours. Thyroid Function Tests: No results for input(s): TSH, T4TOTAL, FREET4, T3FREE, THYROIDAB in the last 72 hours. Anemia Panel: No results for input(s): VITAMINB12, FOLATE, FERRITIN, TIBC, IRON, RETICCTPCT in the last 72 hours. Sepsis Labs: Recent Labs  Lab 08/19/20 1549 08/19/20 2217  LATICACIDVEN 2.9* 2.3*    Recent Results (from the past 240 hour(s))  Culture, blood (x 2)     Status: None (Preliminary result)   Collection Time: 08/19/20  3:49 PM    Specimen: BLOOD LEFT HAND  Result Value Ref Range Status   Specimen Description BLOOD LEFT HAND  Final   Special Requests   Final    BOTTLES DRAWN AEROBIC AND ANAEROBIC Blood Culture results may not be optimal due to an inadequate volume of blood received in culture bottles   Culture   Final    NO GROWTH < 24 HOURS Performed at [facility], [place] [address] [place], [place] [address]    Report Status PENDING  Incomplete  Culture, blood (x 2)     Status: None (Preliminary result)   Collection Time: 08/19/20 10:41 PM   Specimen: BLOOD LEFT ARM  Result Value Ref Range Status   Specimen Description BLOOD LEFT ARM  Final   Special Requests   Final    BOTTLES DRAWN AEROBIC ONLY Blood Culture adequate volume   Culture   Final    NO GROWTH < 12 HOURS Performed at [facility], [place] [address], [place], [place] [address]    Report Status PENDING  Incomplete    Radiology Studies: DG Chest Port 1 View  Result Date: 08/19/2020 CLINICAL DATA:  Weakness EXAM: PORTABLE CHEST 1 VIEW COMPARISON:  07/25/2020 FINDINGS: Cardiac silhouette is within normal limits and is accentuated by low lung volumes and portable technique. Aortic atherosclerosis. Low lung volumes with bibasilar opacities. No visible pleural effusions. No pneumothorax. No acute osseous abnormality. IMPRESSION: Bibasilar opacities, which may represent atelectasis, aspiration and/or pneumonia. Electronically Signed   By: Margaretha [place] MD   On: 08/19/2020 15:01   Scheduled Meds: . heparin  5,000 Units Subcutaneous Q8H  . insulin aspart  0-6 Units Subcutaneous TID WC  . methylPREDNISolone (SOLU-MEDROL) injection  40 mg Intravenous Q12H  . nystatin  5 mL Oral QID   Continuous Infusions: . lactated ringers 75 mL/hr at 08/20/20 0533  . piperacillin-tazobactam 3.375 g (08/20/20 0534)     LOS: 1 day   Time spent: 24mn  Olla Delancey C Kyndall Amero, DO [facility]  If 7PM-7AM, please contact  night-coverage [email or web address]  08/20/2020, 11:56 AM

## 2020-08-20 NOTE — Evaluation (Signed)
Clinical/Bedside Swallow Evaluation Patient Details  Name: Anthony [name] MRN: [id number] Date of Birth: 1934-10-03  Today's Date: 08/20/2020 Time: SLP Start Time (ACUTE ONLY): 0845 SLP Stop Time (ACUTE ONLY): 0905 SLP Time Calculation (min) (ACUTE ONLY): 20 min  Past Medical History:  Past Medical History:  Diagnosis Date  . Arthritis    rheumatoid-  Dr Barkley Boards  . CHF (congestive heart failure) (HCC)    chronic systolic heart failure per Dr [name] LOV note 09/23/11.   EKG 4/12, stress test  and cath from 8/12 on chart.  Clearance with LOV Dr [name] on chart  . Coronary artery disease   . Crohn disease ([place])   . Dementia ([place])   . Dysrhythmia    nonsustained,asymptomatic VT per Dr [name] office note  resulting in cath  . GERD (gastroesophageal reflux disease)   . Heart attack ([place]) 1996, 06/2011  . Hyperlipidemia   . Hypertension   . Pneumonia 1993  . Prostate troubles    states take alternating meds for bladder/prostate control- "age thing"  . Sleep apnea    sleep study years ago- has apnea but did not qualify for CPAP   Past Surgical History:  Past Surgical History:  Procedure Laterality Date  . CARDIAC CATHETERIZATION     1996/ 2012 report on chart  . COLONOSCOPY    . HIP ARTHROPLASTY Left 03/04/2019   Procedure: LEFT HIP HEMIARTHROPLASTY;  Surgeon: Mcarthur Rossetti, MD;  Location: [facility];  Service: Orthopedics;  Laterality: Left;  . INGUINAL HERNIA REPAIR  11/04/2011   Procedure: HERNIA REPAIR INGUINAL ADULT;  Surgeon: Pedro Earls, MD;  Location: [facility];  Service: General;  Laterality: Right;  Right Inguinal Hernia Repair with Mesh  . KNEE ARTHROSCOPY Right 1976  . MENISCUS DEBRIDEMENT Left 1996  . TRIGGER FINGER RELEASE Right 10/10/2009  . UMBILICAL HERNIA REPAIR  2009  . WRIST SURGERY Left 04/25/91   HPI:  Anthony [name] is a 86 y.o. male with history of dementia, CAD, CHF, diabetes mellitus, Crohn's disease, rheumatoid arthritis was brought to the ER 9/2  with Covid infection. Presented with acute respiratory disease secondary to Covid infection with CT scan showing bilateral infiltrates. Readmitted 9/27 from SNF due to decreased responsiveness and poor p.o. intake. Pt currently presents with sepsis secondary to PNA. MD reports likely post viral bacterial pneumonia, cannot rule out aspiration pneumonia versus pneumonitis. MBS completed 09/2018 revealed mild oropharyngeal dysphagia with pt exhibiting increased lethargy, respiratory impairments, and continuous and spontaneous cough during instrumental.  Assessment / Plan / Recommendation Clinical Impression  Pt was seen for a BSE given concern for possible aspiration PNA. Pt has a hx of dementia and was awake/alert and cooperative. He presents with a hoarse vocal quality and generalized weakness. Excessive secretions were observed in the oral cavity and the SLP completed oral care. Pt tolerated ice chips, puree, and solid without any overt s/sx of aspiration. However, an immediate throat clear and an immediate cough were observed with thin liquid. Given possible signs of aspiration at bedisde and concern for aspiration pneumonia, recommend MBS to further assess swallow function. SLP Visit Diagnosis: Dysphagia, unspecified (R13.10)    Aspiration Risk  Moderate aspiration risk    Diet Recommendation NPO   Medication Administration: Via alternative means    Other  Recommendations Oral Care Recommendations: Oral care BID   Follow up Recommendations        Frequency and Duration min 2x/week  2 weeks       Prognosis Prognosis  for Safe Diet Advancement: Fair      Swallow Study   General HPI: Anthony [name] is a 86 y.o. male with history of dementia, CAD, CHF, diabetes mellitus, Crohn's disease, rheumatoid arthritis was brought to the ER 9/2 with Covid infection. Presented with acute respiratory disease secondary to Covid infection with CT scan showing bilateral infiltrates. Readmitted 9/27 from  SNF due to decreased responsiveness and poor p.o. intake. Pt currently presents with sepsis secondary to PNA. MD reports likely post viral bacterial pneumonia, cannot rule out aspiration pneumonia versus pneumonitis.  Type of Study: Bedside Swallow Evaluation Diet Prior to this Study: NPO Temperature Spikes Noted: No Respiratory Status: Room air History of Recent Intubation: No Behavior/Cognition: Alert;Cooperative;Pleasant mood Oral Cavity Assessment: Excessive secretions Oral Care Completed by SLP: Yes Oral Cavity - Dentition: Missing dentition Vision: Functional for self-feeding Self-Feeding Abilities: Needs assist Patient Positioning: Upright in bed Baseline Vocal Quality: Hoarse;Low vocal intensity    Oral/Motor/Sensory Function Overall Oral Motor/Sensory Function: Within functional limits   Ice Chips Ice chips: Within functional limits Presentation: Spoon   Thin Liquid Thin Liquid: Impaired Presentation: Straw;Cup Pharyngeal  Phase Impairments: Throat Clearing - Immediate;Cough - Immediate    Nectar Thick Nectar Thick Liquid: Not tested   Honey Thick Honey Thick Liquid: Not tested   Puree Puree: Within functional limits Presentation: Self Fed;Spoon   Solid     Solid: Within functional limits Presentation: Self Fed      Greggory Keen 08/20/2020,9:42 AM

## 2020-08-21 DIAGNOSIS — A419 Sepsis, unspecified organism: Secondary | ICD-10-CM | POA: Diagnosis not present

## 2020-08-21 DIAGNOSIS — J189 Pneumonia, unspecified organism: Secondary | ICD-10-CM | POA: Diagnosis not present

## 2020-08-21 LAB — GLUCOSE, CAPILLARY
Glucose-Capillary: 511 mg/dL (ref 70–99)
Glucose-Capillary: 455 mg/dL — ABNORMAL HIGH (ref 70–99)
Glucose-Capillary: 391 mg/dL — ABNORMAL HIGH (ref 70–99)
Glucose-Capillary: 232 mg/dL — ABNORMAL HIGH (ref 70–99)
Glucose-Capillary: 141 mg/dL — ABNORMAL HIGH (ref 70–99)
Glucose-Capillary: 290 mg/dL — ABNORMAL HIGH (ref 70–99)

## 2020-08-21 LAB — CBC
HCT: 48.1 % (ref 39.0–52.0)
Hemoglobin: 15.5 g/dL (ref 13.0–17.0)
MCH: 29.8 pg (ref 26.0–34.0)
MCHC: 32.2 g/dL (ref 30.0–36.0)
MCV: 92.5 fL (ref 80.0–100.0)
Platelets: 252 10*3/uL (ref 150–400)
RBC: 5.2 MIL/uL (ref 4.22–5.81)
RDW: 15.3 % (ref 11.5–15.5)
WBC: 21.2 10*3/uL — ABNORMAL HIGH (ref 4.0–10.5)
nRBC: 0 % (ref 0.0–0.2)

## 2020-08-21 LAB — BASIC METABOLIC PANEL
Anion gap: 17 — ABNORMAL HIGH (ref 5–15)
BUN: 39 mg/dL — ABNORMAL HIGH (ref 8–23)
CO2: 24 mmol/L (ref 22–32)
Calcium: 9.2 mg/dL (ref 8.9–10.3)
Chloride: 101 mmol/L (ref 98–111)
Creatinine, Ser: 1.41 mg/dL — ABNORMAL HIGH (ref 0.61–1.24)
GFR calc Af Amer: 53 mL/min — ABNORMAL LOW (ref >60)
GFR calc non Af Amer: 45 mL/min — ABNORMAL LOW (ref >60)
Glucose, Bld: 407 mg/dL — ABNORMAL HIGH (ref 70–99)
Potassium: 3.2 mmol/L — ABNORMAL LOW (ref 3.5–5.1)
Sodium: 142 mmol/L (ref 135–145)

## 2020-08-21 LAB — MAGNESIUM: Magnesium: 2.4 mg/dL (ref 1.7–2.4)

## 2020-08-21 MED ORDER — INSULIN DETEMIR 100 UNIT/ML ~~LOC~~ SOLN
12.0000 [IU] | Freq: Two times a day (BID) | SUBCUTANEOUS | Status: DC
  Administered 2020-08-21 – 2020-08-24 (×7): 12 [IU] via SUBCUTANEOUS
  Filled 2020-08-21 (×8): qty 0.12

## 2020-08-21 MED ORDER — PANTOPRAZOLE SODIUM 40 MG PO TBEC
40.0000 mg | DELAYED_RELEASE_TABLET | Freq: Every day | ORAL | Status: DC
  Administered 2020-08-21 – 2020-08-27 (×7): 40 mg via ORAL
  Filled 2020-08-21 (×7): qty 1

## 2020-08-21 MED ORDER — INSULIN ASPART 100 UNIT/ML ~~LOC~~ SOLN
10.0000 [IU] | Freq: Once | SUBCUTANEOUS | Status: AC
  Administered 2020-08-21: 10 [IU] via SUBCUTANEOUS

## 2020-08-21 MED ORDER — PRAVASTATIN SODIUM 40 MG PO TABS
40.0000 mg | ORAL_TABLET | Freq: Every day | ORAL | Status: DC
  Administered 2020-08-21 – 2020-08-26 (×6): 40 mg via ORAL
  Filled 2020-08-21 (×7): qty 1

## 2020-08-21 MED ORDER — POTASSIUM CHLORIDE 20 MEQ PO PACK
40.0000 meq | PACK | Freq: Once | ORAL | Status: AC
  Administered 2020-08-21: 40 meq via ORAL
  Filled 2020-08-21: qty 2

## 2020-08-21 MED ORDER — POTASSIUM CHLORIDE CRYS ER 20 MEQ PO TBCR
40.0000 meq | EXTENDED_RELEASE_TABLET | Freq: Once | ORAL | Status: AC
  Administered 2020-08-21: 40 meq via ORAL
  Filled 2020-08-21: qty 2

## 2020-08-21 MED ORDER — POTASSIUM CHLORIDE 10 MEQ/100ML IV SOLN
10.0000 meq | INTRAVENOUS | Status: DC
  Administered 2020-08-21: 10 meq via INTRAVENOUS
  Filled 2020-08-21 (×2): qty 100

## 2020-08-21 MED ORDER — MESALAMINE 1.2 G PO TBEC
2.4000 g | DELAYED_RELEASE_TABLET | Freq: Two times a day (BID) | ORAL | Status: DC
  Administered 2020-08-21 – 2020-08-27 (×12): 2.4 g via ORAL
  Filled 2020-08-21 (×14): qty 2

## 2020-08-21 MED ORDER — SODIUM CHLORIDE 0.9 % IV BOLUS
500.0000 mL | Freq: Once | INTRAVENOUS | Status: AC
  Administered 2020-08-21: 500 mL via INTRAVENOUS

## 2020-08-21 MED ORDER — INSULIN ASPART 100 UNIT/ML ~~LOC~~ SOLN
16.0000 [IU] | Freq: Once | SUBCUTANEOUS | Status: AC
  Administered 2020-08-21: 16 [IU] via SUBCUTANEOUS

## 2020-08-21 MED ORDER — INSULIN ASPART 100 UNIT/ML ~~LOC~~ SOLN
0.0000 [IU] | SUBCUTANEOUS | Status: DC
  Administered 2020-08-21: 11 [IU] via SUBCUTANEOUS
  Administered 2020-08-21: 3 [IU] via SUBCUTANEOUS
  Administered 2020-08-22: 7 [IU] via SUBCUTANEOUS
  Administered 2020-08-22: 3 [IU] via SUBCUTANEOUS
  Administered 2020-08-22 – 2020-08-24 (×8): 4 [IU] via SUBCUTANEOUS
  Administered 2020-08-24 (×2): 3 [IU] via SUBCUTANEOUS

## 2020-08-21 MED ORDER — NITROGLYCERIN 0.4 MG SL SUBL: 0.4000 mg | SUBLINGUAL_TABLET | SUBLINGUAL | Status: DC | PRN

## 2020-08-21 MED ORDER — DONEPEZIL HCL 5 MG PO TABS
5.0000 mg | ORAL_TABLET | Freq: Every day | ORAL | Status: DC
  Administered 2020-08-21 – 2020-08-26 (×6): 5 mg via ORAL
  Filled 2020-08-21 (×7): qty 1

## 2020-08-21 NOTE — Progress Notes (Signed)
Unable to do admission questions at this time.  Family members are not present and patient is alert and oriented to self only.

## 2020-08-21 NOTE — Plan of Care (Signed)
  Problem: Education: Goal: Knowledge of General Education information will improve Description: Including pain rating scale, medication(s)/side effects and non-pharmacologic comfort measures Outcome: Progressing   Problem: Health Behavior/Discharge Planning: Goal: Ability to manage health-related needs will improve Outcome: Progressing   Problem: Clinical Measurements: Goal: Ability to maintain clinical measurements within normal limits will improve Outcome: Progressing   Problem: Activity: Goal: Risk for activity intolerance will decrease Outcome: Progressing   Problem: Nutrition: Goal: Adequate nutrition will be maintained Outcome: Progressing   Problem: Elimination: Goal: Will not experience complications related to bowel motility Outcome: Progressing   Problem: Pain Managment: Goal: General experience of comfort will improve Outcome: Progressing   Problem: Safety: Goal: Ability to remain free from injury will improve Outcome: Progressing   Problem: Skin Integrity: Goal: Risk for impaired skin integrity will decrease Outcome: Progressing

## 2020-08-21 NOTE — Progress Notes (Signed)
Nutrition Follow-up  DOCUMENTATION CODES:   Not applicable  INTERVENTION:   -Continue MVI with minerals daily -Continue feeding assistance with meals -Continue Magic cup TID with meals, each supplement provides 290 kcal and 9 grams of protein -Continue Hormel Shake TID with meals, each supplement provides 520 kcals and 22 grams protein -Continue Nepro Shake po TID, each supplement provides 425 kcal and 19 grams protein  NUTRITION DIAGNOSIS:   Inadequate oral intake related to inability to eat as evidenced by NPO status.  Progressing; s/p MBSS on 9/28 and advanced to dysphagia 1 diet with nectar thick liquids  GOAL:   Patient will meet greater than or equal to 90% of their needs  Progressing   MONITOR:   Diet advancement, Labs, Weight trends, Skin, I & O's  REASON FOR ASSESSMENT:   Consult Assessment of nutrition requirement/status  ASSESSMENT:   Anthony [name] is a 86 y.o. male with medical history significant for rheumatoid arthritis, systolic heart failure, CAD, Crohn's disease, dementia, GERD, hyperlipidemia, as well as recent Covid infection more than 21 days ago here at [facility] with moderate improvement but general decline at SNF over the past few weeks.  9/28- s/p MBSS- advanced to dysphagia 1 diet with nectar thick liquids  Reviewed I/O's: +240 ml x 24 hours and +476 ml since admission  Pt very lethargic at time of visit. He did not respond to voice or touch. No family present to provide further history. Reviewed MAR from [facility]. Pt was not on any oral nutrition supplements PTA.   Observed breakfast tray- pt consumed only about 20% of meal tray. Per SLP note, pt mostly interested in consuming liquids only. He consumed a few bites of Magic Cup and about 75% of thickened juice and Hormel Shake.   Pt does not not meet criteria for malnutrition at this time, however, remains at high risk secondary to advanced age, dementia, and limited oral  intake.   Medications reviewed and include IV solu-medrol. Suspect this is contributing to hyperglycemia. DM coordinator has been consulted.   Labs reviewed: CBGS: [id number] (inpatient orders for glycemic control are 0-15 units insulin aspart TID with meals).   NUTRITION - FOCUSED PHYSICAL EXAM:    Most Recent Value  Orbital Region No depletion  Upper Arm Region Mild depletion  Thoracic and Lumbar Region No depletion  Buccal Region No depletion  Temple Region No depletion  Clavicle Bone Region No depletion  Clavicle and Acromion Bone Region No depletion  Scapular Bone Region No depletion  Dorsal Hand Mild depletion  Patellar Region Mild depletion  Anterior Thigh Region Mild depletion  Posterior Calf Region Mild depletion  Edema (RD Assessment) None  Hair Reviewed  Eyes Reviewed  Mouth Reviewed  Skin Reviewed  Nails Reviewed       Diet Order:   Diet Order            DIET - DYS 1 Room service appropriate? Yes with Assist; Fluid consistency: Nectar Thick  Diet effective now                 EDUCATION NEEDS:   Not appropriate for education at this time  Skin:  Skin Assessment: Reviewed RN Assessment  Last BM:  Unknown  Height:   Ht Readings from Last 1 Encounters:  08/19/20 6' 1"  (1.854 m)    Weight:   Wt Readings from Last 1 Encounters:  08/19/20 81 kg    Ideal Body Weight:  83.6 kg  BMI:  Body  mass index is 23.56 kg/m.  Estimated Nutritional Needs:   Kcal:  2000-2200  Protein:  105-120 grams  Fluid:  > 2 L    Loistine Chance, RD, LDN, [place] Registered Dietitian II Certified Diabetes Care and Education Specialist Please refer to [facility] for RD and/or RD on-call/weekend/after hours pager

## 2020-08-21 NOTE — Progress Notes (Signed)
Inpatient Diabetes Program Recommendations  AACE/ADA: New Consensus Statement on Inpatient Glycemic Control (2015)  Target Ranges:  Prepandial:   less than 140 mg/dL      Peak postprandial:   less than 180 mg/dL (1-2 hours)      Critically ill patients:  140 - 180 mg/dL   Lab Results  Component Value Date   GLUCAP 455 (H) 08/21/2020   HGBA1C 7.2 (H) 07/26/2020    Review of Glycemic Control Results for Anthony [name], Anthony [name]" (MRN [id number]) as of 08/21/2020 10:29  Ref. Range 08/20/2020 11:39 08/20/2020 16:20 08/21/2020 01:48 08/21/2020 06:33  Glucose-Capillary Latest Ref Range: 70 - 99 mg/dL 377 (H) 440 (H) 511 (HH) 455 (H)   Diabetes history: Type 2 DM Outpatient Diabetes medications: none Current orders for Inpatient glycemic control: Novolog 0-15 units TID, Novolog 16 units x 1. Solumedrol 40 mg BID  Inpatient Diabetes Program Recommendations:    In the setting of steroids, infection and now dysphagia diet, glucose trends far exceeding goals >500 mg/dL.   At this time consider,  - Adding Novolog 0-20 units Q4H - Adding Levemir 12 units BID - Adding BMET x 1 Pending results of BMET Or of glucose trends remain >400 mg/dL consider IV insulin to better determine insulin needs.  Secure chat sent to MD. Thanks, Bronson Curb, MSN, RNC-OB Diabetes Coordinator [phone] (8a-5p)

## 2020-08-21 NOTE — Plan of Care (Signed)
  Problem: Education: Goal: Knowledge of General Education information will improve Description: Including pain rating scale, medication(s)/side effects and non-pharmacologic comfort measures Outcome: Not Progressing   Problem: Health Behavior/Discharge Planning: Goal: Ability to manage health-related needs will improve Outcome: Not Progressing

## 2020-08-21 NOTE — Progress Notes (Addendum)
PROGRESS NOTE    MYLES TAVELLA  POE:[id number]  DOB: 1934-03-22  DOA: 08/19/2020 PCP: Josetta Huddle, MD Outpatient Specialists:   Hospital course:  [name] a 86 y.o.malewith medical history significantfor rheumatoid arthritis, systolic heart failure, CAD, Crohn's disease, dementia, GERD, hyperlipidemia, as well as recent Covid infection more than 21 days ago here at [facility] with moderate improvement but general decline at SNF over the past few weeks. Patient was evaluated at SNF and found to be less responsive than previous baseline although given his worsening status and baseline dementia it is unclear whether or not he had any acute changes rather than general decline. Daughter was contacted for further information and indicates no acute changes to the best of her knowledge but simply a further decline in patient's status and poor p.o. intake over the past few days. In the ED patient was evaluated noted to be afebrile, satting 99 to 100% on 2 L nasal cannula which was his discharge oxygen level from previous hospitalization. His labs remarkable for mild leukocytosis of 15.1 with elevated lactic acidosis at 2.9 and elevated creatinine at 1.3 with baseline creatinine previously around 0.9. Chest x-ray was concerning for pneumonia and hospitalist was called to admit for community-acquired pneumonia in the setting of sepsis and mental status changes.   Subjective:  Himself is without any complaints.  He states he does know he is in [facility].  Not sure how he got here or why.  Does admit to feeling quite weak and tired.  Denies overt shortness of breath.  Denies any pain.  Patient's daughters are very concerned that patient's Ativan has been stopped and that it had been decreased in the nursing home prior to him coming.  They are also concerned that he has not been continued on his mesalamine.   Objective: Vitals:   08/20/20 2141 08/21/20 0412 08/21/20  0801 08/21/20 1346  BP: 111/66 (!) 144/71 123/77 126/75  Pulse: 95 87 90 75  Resp:  17 16 17   Temp: (!) 97.5 F (36.4 C) 97.6 F (36.4 C) 98.5 F (36.9 C) 98.2 F (36.8 C)  TempSrc: Oral Oral Oral Oral  SpO2:  93% 96% 97%  Weight:      Height:        Intake/Output Summary (Last 24 hours) at 08/21/2020 1509 Last data filed at 08/21/2020 0827 Gross per 24 hour  Intake 120 ml  Output --  Net 120 ml   Filed Weights   08/19/20 1421  Weight: 81 kg     Exam:  General: Family weak appearing gentleman lying in bed looking very tired Eyes: sclera anicteric, conjuctiva mild injection bilaterally CVS: S1-S2, regular  Respiratory:  decreased air entry bilaterally secondary to decreased inspiratory effort, rales at bases  GI: NABS, soft, NT  LE: No edema.  Neuro: Patient is somewhat sleepy and tired but arousable to voice alone.  He is oriented to person and place not to time. Psych: Patient clearly has moderate dementia.   Assessment & Plan:   Acute metabolic encephalopathy secondary to pneumonia superimposed on dementia Patient is tired appearing but easily arousable with treatment of pneumonia Patient's family is very eager for him to restart on his Ativan I noted that when patients mental status is essentially back at baseline, we could restart Ativan  HCAP vs aspiration pneumonia vs post Covid pneumonia Patient is responding well to Zosyn with improvement in mental status However patient's leukocytosis is worse, unclear etiology Patient remains afebrile  Patient remains with excellent oxygenation 97% on room air MRSA previously negative  Persistent hyperglycemia secondary to uncontrolled DM Appreciate diabetes coordinator input Placed on detemir 12 twice daily and increased SSI to every 4 hours Sugars are finally coming under better control today Follow closely  AKI Patient has had a rising creatinine over the past couple days We will provide 500 cc normal saline  bolus and increase LR to 125 cc an hour IVF has been modest given known history of systolic heart failure however patient has had increased insensible losses due to sepsis and persistent hyperglycemia.   Follow lung exam closely  Hypokalemia We will replete and recheck  Dysphagia Patient was seen by SLP who note mild oropharyngeal dysphagia complicated by lethargy and respiratory impairments with continues and spontaneous cough  SLP recommends dysphagia 1 diet Patient is on nystatin swish and swallow for ongoing thrush  IBS/Crohn's disease Mesalamine restarted Patient is on IV Solu-Medrol, had been on prednisone 10 mg daily at home  CHF No evidence for acute flare at present however I am providing IV fluids given kidney dysfunction so we will need to follow lung exam very closely. Labetalol and Lasix are being held due to low blood pressure   HTN Hold carvedilol and Lasix given sepsis and low blood pressures Low threshold for restarting carvedilol once blood pressures prove  BPH Hold tamsulosin until blood pressures rebound  Polypharmacy Patient is on many medications, these need to be reviewed with PCP and/or SNF and/or family At present continue to hold melatonin, lorazepam, hydrocodone, primidone, tamsulosin, carvedilol and Lasix.  Goals for care Discussed at length with patient's daughter [name] given patient's ongoing decline since his diagnosis of Covid in September.  Patient's daughter states that she will think about quality of life versus quantity of life and will discuss with her sister.  Sepsis secondary to pneumonia Now resolved   DVT prophylaxis: Heparin Code Status: DNR Family Communication: Spoke at length with patient's daughter [name] who notes that her sister has been very unhappy with the care that patient has been getting.  In particular there concerned about Ativan being held as well as mesalamine being held.  They also want him to get "a suction  catheter" because of the breakdown on his penis from condom cath.  Disposition Plan:   Patient is from: SNF  Anticipated Discharge Location: SNF  Barriers to Discharge: Acute metabolic encephalopathy  Is patient medically stable for Discharge: No   Consultants:  None  Procedures:  None  Antimicrobials:  Zosyn   Data Reviewed:  Basic Metabolic Panel: Recent Labs  Lab 08/19/20 1549 08/19/20 2241 08/20/20 1222 08/21/20 1025  NA 139 138  --  142  K 3.7 3.6  --  3.2*  CL 98 99  --  101  CO2 25 26  --  24  GLUCOSE 372* 327*  --  407*  BUN 40* 33*  --  39*  CREATININE 1.34* 1.05 1.37* 1.41*  CALCIUM 9.3 8.5*  --  9.2   Liver Function Tests: Recent Labs  Lab 08/19/20 1549  AST 21  ALT 25  ALKPHOS 97  BILITOT 1.0  PROT 6.8  ALBUMIN 2.7*   No results for input(s): LIPASE, AMYLASE in the last 168 hours. No results for input(s): AMMONIA in the last 168 hours. CBC: Recent Labs  Lab 08/19/20 1549 08/19/20 2241 08/21/20 1025  WBC 15.1* 13.1* 21.2*  NEUTROABS 13.4*  --   --   HGB 16.6 14.5 15.5  HCT 51.4 45.3 48.1  MCV 91.8 90.4 92.5  PLT 291 234 252   Cardiac Enzymes: No results for input(s): CKTOTAL, CKMB, CKMBINDEX, TROPONINI in the last 168 hours. BNP (last 3 results) No results for input(s): PROBNP in the last 8760 hours. CBG: Recent Labs  Lab 08/20/20 1620 08/21/20 0148 08/21/20 0633 08/21/20 1042 08/21/20 1330  GLUCAP 440* 511* 455* 391* 232*    Recent Results (from the past 240 hour(s))  Culture, blood (x 2)     Status: None (Preliminary result)   Collection Time: 08/19/20  3:49 PM   Specimen: BLOOD LEFT HAND  Result Value Ref Range Status   Specimen Description BLOOD LEFT HAND  Final   Special Requests   Final    BOTTLES DRAWN AEROBIC AND ANAEROBIC Blood Culture results may not be optimal due to an inadequate volume of blood received in culture bottles   Culture   Final    NO GROWTH 2 DAYS Performed at [facility], [place] [address], [place], [place] [address]    Report Status PENDING  Incomplete  Culture, blood (x 2)     Status: None (Preliminary result)   Collection Time: 08/19/20 10:41 PM   Specimen: BLOOD LEFT ARM  Result Value Ref Range Status   Specimen Description BLOOD LEFT ARM  Final   Special Requests   Final    BOTTLES DRAWN AEROBIC ONLY Blood Culture adequate volume   Culture   Final    NO GROWTH 2 DAYS Performed at [facility], [place] [address] [place], [place] [address]    Report Status PENDING  Incomplete      Studies: DG Swallowing Func-Speech Pathology  Result Date: 08/20/2020 Completed and documented by Greggory Keen, SLP Student Supervised and reviewed by Herbie Baltimore MA CCC-SLP Objective Swallowing Evaluation: Type of Study: MBS-Modified Barium Swallow Study  Patient Details Name: JAVELLE DONIGAN MRN: [id number] Date of Birth: [birth year] Today's Date: 08/20/2020 Time: SLP Start Time (ACUTE ONLY): 1007 -SLP Stop Time (ACUTE ONLY): 1033 SLP Time Calculation (min) (ACUTE ONLY): 26 min Past Medical History: Past Medical History: Diagnosis Date . Arthritis   rheumatoid-  Dr Barkley Boards . CHF (congestive heart failure) (HCC)   chronic systolic heart failure per Dr [name] LOV note 09/23/11.   EKG 4/12, stress test  and cath from 8/12 on chart.  Clearance with LOV Dr [name] on chart . Coronary artery disease  . Crohn disease ([place])  . Dementia ([place])  . Dysrhythmia   nonsustained,asymptomatic VT per Dr [name] office note  resulting in cath . GERD (gastroesophageal reflux disease)  . Heart attack ([place]) 1996, 06/2011 . Hyperlipidemia  . Hypertension  . Pneumonia 1993 . Prostate troubles   states take alternating meds for bladder/prostate control- "age thing" . Sleep apnea   sleep study years ago- has apnea but did not qualify for CPAP Past Surgical History: Past Surgical History: Procedure Laterality Date . CARDIAC CATHETERIZATION    1996/ 2012 report on chart . COLONOSCOPY   . HIP ARTHROPLASTY Left 03/04/2019   Procedure: LEFT HIP HEMIARTHROPLASTY;  Surgeon: Mcarthur Rossetti, MD;  Location: [facility];  Service: Orthopedics;  Laterality: Left; . INGUINAL HERNIA REPAIR  11/04/2011  Procedure: HERNIA REPAIR INGUINAL ADULT;  Surgeon: Pedro Earls, MD;  Location: [facility];  Service: General;  Laterality: Right;  Right Inguinal Hernia Repair with Mesh . KNEE ARTHROSCOPY Right 1976 . MENISCUS DEBRIDEMENT Left 1996 . TRIGGER FINGER RELEASE Right 10/10/2009 . UMBILICAL HERNIA REPAIR  2009 . WRIST SURGERY Left 04/25/91 HPI: JAVIER GELL is a 86 y.o. male with history of dementia, CAD, CHF, diabetes mellitus, Crohn's disease, rheumatoid arthritis was brought to the ER 9/2 with Covid infection. Presented with acute respiratory disease secondary to Covid infection with CT scan showing bilateral infiltrates. Readmitted 9/27 from SNF due to decreased responsiveness and poor p.o. intake. Pt currently presents with sepsis secondary to PNA. MD reports likely post viral bacterial pneumonia, cannot rule out aspiration pneumonia versus pneumonitis. MBS completed 09/2018 revealed mild oropharyngeal dysphagia with pt exhibiting increased lethargy, respiratory impairments, and continuous and spontaneous cough during instrumental.  No data recorded Assessment / Plan / Recommendation CHL IP CLINICAL IMPRESSIONS 08/20/2020 Clinical Impression MBS revealed moderate oropharyngeal with pt exhibiting delayed oral transit, premature spillage, vallecular residue, and several instances of silent aspiration across POs. Pt exhibited increased lethargy throughout the study and required cueing to continue participation. Pt demonstrated reduced laryngeal/airway closure resulting in silent aspiration with thin liquid with both cup and straw sips. Given cueing to clear his throat, he produced a weak and ineffective cough. SLP attempted chin tuck with thin liquid, but pt was unable to execute maneuver and silent aspiration occurred. No aspiration was  observed with cup sips of nectar, however, silent aspiration of nectar was observed via straw due to increased volume and flow combined with inadequate airway protection. No observation of aspiration occurred with puree, but mild vallecular residue was observed. Pt demonstrated delayed oral transit and increased difficulty initiating a swallow wih a solid and the swallow was not captured by MBS. Recommend dys 1 (puree) diet and nectar thin liquids via cup (no straw). Pt should utilize small bites/sips. SLP Visit Diagnosis Dysphagia, oropharyngeal phase (R13.12) Attention and concentration deficit following -- Frontal lobe and executive function deficit following -- Impact on safety and function Severe aspiration risk   CHL IP TREATMENT RECOMMENDATION 08/20/2020 Treatment Recommendations Therapy as outlined in treatment plan below   Prognosis 08/20/2020 Prognosis for Safe Diet Advancement Fair Barriers to Reach Goals -- Barriers/Prognosis Comment (No Data) CHL IP DIET RECOMMENDATION 08/20/2020 SLP Diet Recommendations Dysphagia 1 (Puree) solids;Nectar thick liquid Liquid Administration via No straw;Cup Medication Administration Crushed with puree Compensations Small sips/bites Postural Changes Seated upright at 90 degrees   CHL IP OTHER RECOMMENDATIONS 08/20/2020 Recommended Consults -- Oral Care Recommendations Oral care BID Other Recommendations --   CHL IP FOLLOW UP RECOMMENDATIONS 08/20/2020 Follow up Recommendations Skilled Nursing facility   [facility] IP FREQUENCY AND DURATION 08/20/2020 Speech Therapy Frequency (ACUTE ONLY) min 2x/week Treatment Duration 2 weeks      CHL IP ORAL PHASE 08/20/2020 Oral Phase Impaired Oral - Pudding Teaspoon -- Oral - Pudding Cup -- Oral - Honey Teaspoon -- Oral - Honey Cup -- Oral - Nectar Teaspoon -- Oral - Nectar Cup Premature spillage Oral - Nectar Straw Premature spillage Oral - Thin Teaspoon -- Oral - Thin Cup Premature spillage Oral - Thin Straw Premature spillage Oral - Puree  Premature spillage Oral - Mech Soft -- Oral - Regular Delayed oral transit Oral - Multi-Consistency -- Oral - Pill -- Oral Phase - Comment --  CHL IP PHARYNGEAL PHASE 08/20/2020 Pharyngeal Phase Impaired Pharyngeal- Pudding Teaspoon -- Pharyngeal -- Pharyngeal- Pudding Cup -- Pharyngeal -- Pharyngeal- Honey Teaspoon -- Pharyngeal -- Pharyngeal- Honey Cup -- Pharyngeal -- Pharyngeal- Nectar Teaspoon -- Pharyngeal -- Pharyngeal- Nectar Cup -- Pharyngeal -- Pharyngeal- Nectar Straw Reduced airway/laryngeal closure Pharyngeal -- Pharyngeal- Thin Teaspoon -- Pharyngeal -- Pharyngeal- Thin Cup Penetration/Aspiration before  swallow;Penetration/Apiration after swallow Pharyngeal Material enters airway, passes BELOW cords without attempt by patient to eject out (silent aspiration) Pharyngeal- Thin Straw Penetration/Aspiration before swallow;Penetration/Aspiration during swallow Pharyngeal Material enters airway, passes BELOW cords without attempt by patient to eject out (silent aspiration) Pharyngeal- Puree Pharyngeal residue - valleculae Pharyngeal -- Pharyngeal- Mechanical Soft -- Pharyngeal -- Pharyngeal- Regular Pharyngeal residue - valleculae;Delayed swallow initiation-vallecula Pharyngeal -- Pharyngeal- Multi-consistency -- Pharyngeal -- Pharyngeal- Pill -- Pharyngeal -- Pharyngeal Comment --  CHL IP CERVICAL ESOPHAGEAL PHASE 10/07/2018 Cervical Esophageal Phase WFL Pudding Teaspoon -- Pudding Cup -- Honey Teaspoon -- Honey Cup -- Nectar Teaspoon -- Nectar Cup -- Nectar Straw -- Thin Teaspoon -- Thin Cup -- Thin Straw -- Puree -- Mechanical Soft -- Regular -- Multi-consistency -- Pill -- Cervical Esophageal Comment -- DeBlois, Katherene Ponto 08/20/2020, 1:00 PM                Scheduled Meds: . feeding supplement (NEPRO CARB STEADY)  237 mL Oral TID BM  . heparin  5,000 Units Subcutaneous Q8H  . insulin aspart  0-20 Units Subcutaneous Q4H  . insulin detemir  12 Units Subcutaneous BID  . mesalamine  2.4 g Oral  BID  . methylPREDNISolone (SOLU-MEDROL) injection  40 mg Intravenous Q12H  . multivitamin with minerals  1 tablet Oral Daily  . nystatin  5 mL Oral QID   Continuous Infusions: . lactated ringers 75 mL/hr at 08/20/20 0533  . piperacillin-tazobactam 3.375 g (08/21/20 1332)    Principal Problem:   Sepsis due to pneumonia [facility]) Active Problems:   Benign prostatic hyperplasia without urinary obstruction   Chronic combined systolic and diastolic heart failure (HCC)   Benign essential HTN   Thrush   Acute metabolic encephalopathy   Failure to thrive in adult   Unspecified protein-calorie malnutrition ([place])     Jahayra Mazo Tublu Shoua [place]ll[place], [facility]  If 7PM-7AM, please contact night-coverage [email or web address] Password [email or web address] 08/21/2020, 3:09 PM    LOS: 2 days

## 2020-08-21 NOTE — Progress Notes (Signed)
CRITICAL VALUE ALERT  Critical Value:  CBG 511  Date & Time Notied:  08/21/2020 0154  Provider Notified: On call MD - Amion  Orders Received/Actions taken: Novolog 10 units now

## 2020-08-21 NOTE — Progress Notes (Signed)
CRITICAL VALUE ALERT  Critical Value:  CBG 455  Date & Time Notied:  08/21/2020 0641  Provider Notified: On call MD through Amion  Orders Received/Actions taken: STAT Lab glucose check

## 2020-08-21 NOTE — Progress Notes (Signed)
  Speech Language Pathology Treatment: Dysphagia  Patient Details Name: Anthony [name] MRN: [id number] DOB: 1934/09/01 Today's Date: 08/21/2020 Time: 9371-6967 SLP Time Calculation (min) (ACUTE ONLY): 15 min  Assessment / Plan / Recommendation Clinical Impression  Pt seen with am meal partially consumed in room. Pt appeared to have mostly only attempted the nectar thick liquids. SLP able to encourage a few bits of puree and sips of nectar thickened water at pts request. Delayed coughing observed after sips of water. Delayed sensation of aspiration still probable. No change today to indicate any improvement. Pt reluctant to participate, not very interested in PO, continue current diet and precautions.   HPI HPI: GAYLAN FAUVER is a 86 y.o. male with history of dementia, CAD, CHF, diabetes mellitus, Crohn's disease, rheumatoid arthritis was brought to the ER 9/2 with Covid infection. Presented with acute respiratory disease secondary to Covid infection with CT scan showing bilateral infiltrates. Readmitted 9/27 from SNF due to decreased responsiveness and poor p.o. intake. Pt currently presents with sepsis secondary to PNA. MD reports likely post viral bacterial pneumonia, cannot rule out aspiration pneumonia versus pneumonitis. MBS completed 09/2018 revealed mild oropharyngeal dysphagia with pt exhibiting increased lethargy, respiratory impairments, and continuous and spontaneous cough during instrumental.      SLP Plan  Continue with current plan of care       Recommendations  Diet recommendations: Dysphagia 1 (puree);Nectar-thick liquid Liquids provided via: Teaspoon;Cup Medication Administration: Crushed with puree Compensations: Small sips/bites                Follow up Recommendations: Skilled Nursing facility SLP Visit Diagnosis: Dysphagia, oropharyngeal phase (R13.12) Plan: Continue with current plan of care       GO               Herbie Baltimore, MA [place] Pager [phone] Office [phone]  Lynann Beaver 08/21/2020, 9:31 AM

## 2020-08-22 ENCOUNTER — Encounter (HOSPITAL_COMMUNITY): Payer: Self-pay | Admitting: Internal Medicine

## 2020-08-22 DIAGNOSIS — G9341 Metabolic encephalopathy: Secondary | ICD-10-CM

## 2020-08-22 LAB — GLUCOSE, CAPILLARY
Glucose-Capillary: 106 mg/dL — ABNORMAL HIGH (ref 70–99)
Glucose-Capillary: 143 mg/dL — ABNORMAL HIGH (ref 70–99)
Glucose-Capillary: 153 mg/dL — ABNORMAL HIGH (ref 70–99)
Glucose-Capillary: 171 mg/dL — ABNORMAL HIGH (ref 70–99)
Glucose-Capillary: 197 mg/dL — ABNORMAL HIGH (ref 70–99)
Glucose-Capillary: 241 mg/dL — ABNORMAL HIGH (ref 70–99)

## 2020-08-22 LAB — CBC
HCT: 44.9 % (ref 39.0–52.0)
Hemoglobin: 14.3 g/dL (ref 13.0–17.0)
MCH: 29.9 pg (ref 26.0–34.0)
MCHC: 31.8 g/dL (ref 30.0–36.0)
MCV: 93.7 fL (ref 80.0–100.0)
Platelets: 193 10*3/uL (ref 150–400)
RBC: 4.79 MIL/uL (ref 4.22–5.81)
RDW: 15.1 % (ref 11.5–15.5)
WBC: 15.4 10*3/uL — ABNORMAL HIGH (ref 4.0–10.5)
nRBC: 0 % (ref 0.0–0.2)

## 2020-08-22 LAB — BASIC METABOLIC PANEL
Anion gap: 13 (ref 5–15)
BUN: 26 mg/dL — ABNORMAL HIGH (ref 8–23)
CO2: 28 mmol/L (ref 22–32)
Calcium: 8.7 mg/dL — ABNORMAL LOW (ref 8.9–10.3)
Chloride: 101 mmol/L (ref 98–111)
Creatinine, Ser: 1 mg/dL (ref 0.61–1.24)
GFR calc Af Amer: >60 mL/min (ref >60)
GFR calc non Af Amer: >60 mL/min (ref >60)
Glucose, Bld: 201 mg/dL — ABNORMAL HIGH (ref 70–99)
Potassium: 3.7 mmol/L (ref 3.5–5.1)
Sodium: 142 mmol/L (ref 135–145)

## 2020-08-22 NOTE — Progress Notes (Signed)
PROGRESS NOTE    Anthony [name]  HCW:[id number]  DOB: 06/24/1934  DOA: 08/19/2020 PCP: Anthony Huddle, MD Outpatient Specialists:   Hospital course:  Anthony [name] a 86 y.o.malewith medical history significantfor rheumatoid arthritis, systolic heart failure, CAD, Crohn's disease, dementia, GERD, hyperlipidemia, as well as recent Covid infection more than 21 days ago here at [facility] with moderate improvement but general decline at SNF over the past few weeks. Patient was evaluated at SNF and found to be less responsive than previous baseline although given his worsening status and baseline dementia it is unclear whether or not he had any acute changes rather than general decline. Daughter was contacted for further information and indicates no acute changes to the best of her knowledge but simply a further decline in patient's status and poor p.o. intake over the past few days. In the ED patient was evaluated noted to be afebrile, satting 99 to 100% on 2 L nasal cannula which was his discharge oxygen level from previous hospitalization. His labs remarkable for mild leukocytosis of 15.1 with elevated lactic acidosis at 2.9 and elevated creatinine at 1.3 with baseline creatinine previously around 0.9. Chest x-ray was concerning for pneumonia and hospitalist was called to admit for community-acquired pneumonia in the setting of sepsis and mental status changes.   Subjective:  Patient is quite sleepy this morning.  Attentive daughter is at bedside who note that patient is often sleepy and confused in the morning.  She notes this is baseline for him in the morning.  She hopes he will wake up more as the day goes on.   Objective: Vitals:   08/21/20 1936 08/22/20 0455 08/22/20 0817 08/22/20 1500  BP: 101/66 (!) 156/67 108/61 (!) 102/46  Pulse: 83 68 (!) 50 (!) 54  Resp: 16 17 18 17   Temp: 98.4 F (36.9 C) 98 F (36.7 C) (!) 97.3 F (36.3 C) 98 F (36.7 C)  TempSrc: Oral  Oral Oral Oral  SpO2: 96% 97% 97% 94%  Weight:      Height:        Intake/Output Summary (Last 24 hours) at 08/22/2020 1629 Last data filed at 08/22/2020 0300 Gross per 24 hour  Intake 500 ml  Output 750 ml  Net -250 ml   Filed Weights   08/19/20 1421  Weight: 81 kg     Exam:  General: Sleepy gentleman lying in bed who is arousable by voice but is clearly confused. Eyes: sclera anicteric, conjuctiva mild injection bilaterally CVS: S1-S2, regular  Respiratory:  decreased air entry bilaterally secondary to decreased inspiratory effort, rales at bases  GI: NABS, soft, NT  LE: No edema.  Neuro: Patient is somewhat sleepy and tired but arousable to voice alone.  He is oriented to person and place not to time. Psych: Patient clearly has moderate dementia.   Assessment & Plan:   Acute metabolic encephalopathy secondary to pneumonia superimposed on dementia Ongoing delirium superimposed on dementia This can take days to weeks to even months to clear I have agreed to restart patient's Ativan once his mental status is back to baseline--daughters are very concerned that he is anxious although he does not appear anxious to me.  HCAP vs aspiration pneumonia vs post Covid pneumonia Leukocytosis is improved today down to 15 from 21 yesterday Continue Zosyn  Patient remains afebrile Patient remains with excellent oxygenation 97% on room air MRSA previously negative  Persistent hyperglycemia secondary to uncontrolled DM Blood sugar under much improved control on detemir  12 twice daily and present SSI every 4 hours per recommendations of diabetes coordinator. Can consider decreasing SSI dosage to [facility] at bedtime once patient is eating more regularly.  AKI Creatinine improved and normalized with increased fluids yesterday. We will decrease LR down to 100 cc an hour given known CHF  Hypokalemia Normalized with repletion  Dysphagia Patient was seen by SLP who note mild oropharyngeal  dysphagia complicated by lethargy and respiratory impairments with continues and spontaneous cough  SLP recommends dysphagia 1 diet Patient is on nystatin swish and swallow for ongoing thrush  IBS/Crohn's disease Mesalamine restarted Patient is on IV Solu-Medrol, had been on prednisone 10 mg daily at home  CHF No evidence for acute flare at present however I am providing IV fluids given kidney dysfunction so we will need to follow lung exam very closely. Labetalol and Lasix are being held due to low blood pressure   HTN Hold carvedilol and Lasix given sepsis and low blood pressures Low threshold for restarting carvedilol once blood pressures prove  BPH Hold tamsulosin until blood pressures rebound  Polypharmacy Patient is on many medications, these need to be reviewed with PCP and/or SNF and/or family At present continue to hold melatonin, lorazepam, hydrocodone, primidone, tamsulosin, carvedilol and Lasix.  Goals for care Discussed at length with patient's daughter Anthony [name] given patient's ongoing decline since his diagnosis of Covid in September.  Patient's daughter states that she will think about quality of life versus quantity of life and will discuss with her sister.  Sepsis secondary to pneumonia Now resolved   DVT prophylaxis: Heparin Code Status: DNR Family Communication: Spoke at length with patient's daughter Anthony [name] who notes that her sister has been very unhappy with the care that patient has been getting.  In particular there concerned about Ativan being held as well as mesalamine being held.  They also want him to get "a suction catheter" because of the breakdown on his penis from condom cath.  Disposition Plan:   Patient is from: SNF  Anticipated Discharge Location: SNF  Barriers to Discharge: Acute metabolic encephalopathy  Is patient medically stable for Discharge: No   Consultants:  None  Procedures:  None  Antimicrobials:  Zosyn   Data  Reviewed:  Basic Metabolic Panel: Recent Labs  Lab 08/19/20 1549 08/19/20 2241 08/20/20 1222 08/21/20 1025 08/22/20 1235  NA 139 138  --  142 142  K 3.7 3.6  --  3.2* 3.7  CL 98 99  --  101 101  CO2 25 26  --  24 28  GLUCOSE 372* 327*  --  407* 201*  BUN 40* 33*  --  39* 26*  CREATININE 1.34* 1.05 1.37* 1.41* 1.00  CALCIUM 9.3 8.5*  --  9.2 8.7*  MG  --   --   --  2.4  --    Liver Function Tests: Recent Labs  Lab 08/19/20 1549  AST 21  ALT 25  ALKPHOS 97  BILITOT 1.0  PROT 6.8  ALBUMIN 2.7*   No results for input(s): LIPASE, AMYLASE in the last 168 hours. No results for input(s): AMMONIA in the last 168 hours. CBC: Recent Labs  Lab 08/19/20 1549 08/19/20 2241 08/21/20 1025 08/22/20 1235  WBC 15.1* 13.1* 21.2* 15.4*  NEUTROABS 13.4*  --   --   --   HGB 16.6 14.5 15.5 14.3  HCT 51.4 45.3 48.1 44.9  MCV 91.8 90.4 92.5 93.7  PLT 291 234 252 193   Cardiac Enzymes: No results  for input(s): CKTOTAL, CKMB, CKMBINDEX, TROPONINI in the last 168 hours. BNP (last 3 results) No results for input(s): PROBNP in the last 8760 hours. CBG: Recent Labs  Lab 08/21/20 2036 08/22/20 0019 08/22/20 0451 08/22/20 0824 08/22/20 1153  GLUCAP 290* 171* 153* 106* 143*    Recent Results (from the past 240 hour(s))  Culture, blood (x 2)     Status: None (Preliminary result)   Collection Time: 08/19/20  3:49 PM   Specimen: BLOOD LEFT HAND  Result Value Ref Range Status   Specimen Description BLOOD LEFT HAND  Final   Special Requests   Final    BOTTLES DRAWN AEROBIC AND ANAEROBIC Blood Culture results may not be optimal due to an inadequate volume of blood received in culture bottles   Culture   Final    NO GROWTH 3 DAYS Performed at [facility], [place] [address], [place], [place] [address]    Report Status PENDING  Incomplete  Culture, blood (x 2)     Status: None (Preliminary result)   Collection Time: 08/19/20 10:41 PM   Specimen: BLOOD LEFT ARM  Result Value Ref  Range Status   Specimen Description BLOOD LEFT ARM  Final   Special Requests   Final    BOTTLES DRAWN AEROBIC ONLY Blood Culture adequate volume   Culture   Final    NO GROWTH 3 DAYS Performed at [facility], [place] [address] [place], [place] [address]    Report Status PENDING  Incomplete      Studies: No results found.   Scheduled Meds: . donepezil  5 mg Oral QHS  . feeding supplement (NEPRO CARB STEADY)  237 mL Oral TID BM  . heparin  5,000 Units Subcutaneous Q8H  . insulin aspart  0-20 Units Subcutaneous Q4H  . insulin detemir  12 Units Subcutaneous BID  . mesalamine  2.4 g Oral BID  . methylPREDNISolone (SOLU-MEDROL) injection  40 mg Intravenous Q12H  . multivitamin with minerals  1 tablet Oral Daily  . nystatin  5 mL Oral QID  . pantoprazole  40 mg Oral Daily  . pravastatin  40 mg Oral QHS   Continuous Infusions: . lactated ringers 125 mL/hr at 08/22/20 0223  . piperacillin-tazobactam 3.375 g (08/22/20 1413)    Principal Problem:   Sepsis due to pneumonia [facility]) Active Problems:   Benign prostatic hyperplasia without urinary obstruction   Chronic combined systolic and diastolic heart failure (HCC)   Benign essential HTN   Thrush   Acute metabolic encephalopathy   Failure to thrive in adult   Unspecified protein-calorie malnutrition ([place])     Daleen Steinhaus Tublu Rianna Lukes, [facility]  If 7PM-7AM, please contact night-coverage [email or web address] Password [email or web address] 08/22/2020, 4:29 PM    LOS: 3 days

## 2020-08-23 DIAGNOSIS — E43 Unspecified severe protein-calorie malnutrition: Secondary | ICD-10-CM | POA: Diagnosis not present

## 2020-08-23 DIAGNOSIS — Z515 Encounter for palliative care: Secondary | ICD-10-CM

## 2020-08-23 DIAGNOSIS — Z7189 Other specified counseling: Secondary | ICD-10-CM

## 2020-08-23 DIAGNOSIS — J189 Pneumonia, unspecified organism: Secondary | ICD-10-CM | POA: Diagnosis not present

## 2020-08-23 DIAGNOSIS — Z20822 Contact with and (suspected) exposure to covid-19: Secondary | ICD-10-CM | POA: Diagnosis not present

## 2020-08-23 DIAGNOSIS — A419 Sepsis, unspecified organism: Secondary | ICD-10-CM | POA: Diagnosis not present

## 2020-08-23 LAB — CBC
HCT: 39.1 % (ref 39.0–52.0)
Hemoglobin: 12.6 g/dL — ABNORMAL LOW (ref 13.0–17.0)
MCH: 29.4 pg (ref 26.0–34.0)
MCHC: 32.2 g/dL (ref 30.0–36.0)
MCV: 91.4 fL (ref 80.0–100.0)
Platelets: 181 10*3/uL (ref 150–400)
RBC: 4.28 MIL/uL (ref 4.22–5.81)
RDW: 14.8 % (ref 11.5–15.5)
WBC: 14.6 10*3/uL — ABNORMAL HIGH (ref 4.0–10.5)
nRBC: 0 % (ref 0.0–0.2)

## 2020-08-23 LAB — GLUCOSE, CAPILLARY
Glucose-Capillary: 118 mg/dL — ABNORMAL HIGH (ref 70–99)
Glucose-Capillary: 128 mg/dL — ABNORMAL HIGH (ref 70–99)
Glucose-Capillary: 168 mg/dL — ABNORMAL HIGH (ref 70–99)
Glucose-Capillary: 182 mg/dL — ABNORMAL HIGH (ref 70–99)
Glucose-Capillary: 195 mg/dL — ABNORMAL HIGH (ref 70–99)
Glucose-Capillary: 199 mg/dL — ABNORMAL HIGH (ref 70–99)

## 2020-08-23 LAB — BASIC METABOLIC PANEL
Anion gap: 11 (ref 5–15)
BUN: 22 mg/dL (ref 8–23)
CO2: 25 mmol/L (ref 22–32)
Calcium: 8.4 mg/dL — ABNORMAL LOW (ref 8.9–10.3)
Chloride: 102 mmol/L (ref 98–111)
Creatinine, Ser: 0.86 mg/dL (ref 0.61–1.24)
GFR calc Af Amer: >60 mL/min (ref >60)
GFR calc non Af Amer: >60 mL/min (ref >60)
Glucose, Bld: 126 mg/dL — ABNORMAL HIGH (ref 70–99)
Potassium: 3.5 mmol/L (ref 3.5–5.1)
Sodium: 138 mmol/L (ref 135–145)

## 2020-08-23 NOTE — Progress Notes (Signed)
Palliative Medicine RN Note: Two of Mr Higinbotham daughters have called the Palliative Medicine Team office this am asking for [place] to see him. We have explained that we can only see pt's for which we have a consult order; they will ask for one when the MD rounds. I also sent a secure chat to his attending, Dr Verlon Au, requesting an order, if he feels it is appropriate or for him to let the family know why he does not feel it's appropriate.  Please note that one of his daughters was very insistent that we see him today before 5:00. Even if a consult order were placed right now, it is unlikely we will be able to see him by 5:00 today. If/when a consult order is rec'd, I will notify the providers who are on today, but it will likely be tomorrow (Saturday 10/2) before he is seen.  Marjie Skiff Iran Rowe, RN, BSN, [facility] Palliative Medicine Team 08/23/2020 10:40 AM Office [phone]

## 2020-08-23 NOTE — NC FL2 (Signed)
[place] LEVEL OF CARE SCREENING TOOL     IDENTIFICATION  Patient Name: Anthony [name] Birthdate: 04-09-1934 Sex: male Admission Date (Current Location): 08/19/2020  [facility] and [place] Number:  Herbalist and Address:  [facility] [place], [place] [address] [place], [place] [address]      Provider Number: [id number]  Attending Physician Name and Address:  Nita Sells, MD  Relative Name and Phone Number:       Current Level of Care: Hospital Recommended Level of Care: [place] Prior Approval Number:    Date Approved/Denied:   PASRR Number: [id number]  Discharge Plan: SNF    Current Diagnoses: Patient Active Problem List   Diagnosis Date Noted  . Palliative care by specialist   . Goals of care, counseling/discussion   . Acute metabolic encephalopathy 47/07/6282  . Failure to thrive in adult 08/20/2020  . Unspecified protein-calorie malnutrition ([place]) 08/20/2020  . Sepsis due to pneumonia ([place]) 08/19/2020  . Acute respiratory disease due to COVID-19 virus 07/25/2020  . Hypokalemia 03/23/2020  . S/P hip hemiarthroplasty 04/19/2019  . Pressure injury of skin 03/05/2019  . Suspected COVID-19 virus infection 03/01/2019  . Acute respiratory failure with hypoxia ([place]) 03/01/2019  . Hip fracture ([place]) 03/01/2019  . Fall 03/01/2019  . Displaced fracture of base of neck of left femur, initial encounter for closed fracture ([place])   . Fever   . Nonspecific interstitial pneumonia ([place])   . Febrile illness   . Thrush   . Hypoxia   . Sepsis ([place]) 10/04/2018  . Fatty liver 04/06/2018  . Acute pancreatitis 04/04/2018  . Abdominal pain, right lower quadrant 05/28/2015  . Absolute anemia 05/28/2015  . Aortic atherosclerosis ([place]) 05/28/2015  . Altered blood in stool 05/28/2015  . Chronic systolic heart failure ([place]) 05/28/2015  . Syncope and collapse 05/28/2015  . Arteriosclerosis of coronary artery 05/28/2015   . Cough 05/28/2015  . Crohn's disease of large bowel ([place]) 05/28/2015  . Diarrhea 05/28/2015  . Displacement of cervical intervertebral disc without myelopathy 05/28/2015  . Benign essential HTN 05/28/2015  . Adenopathy 05/28/2015  . Hypercholesterolemia without hypertriglyceridemia 05/28/2015  . Hypo-osmolality and hyponatremia 05/28/2015  . Postinflammatory pulmonary fibrosis ([place]) 05/28/2015  . Healed myocardial infarct 05/28/2015  . Peptic esophagitis 05/28/2015  . 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 Paroxysmal ventricular tachycardia ([place]) 05/28/2015  . Decreased body weight 05/28/2015  . Weakness 11/05/2014  . Nausea with vomiting 11/05/2014  . Generalized weakness   . Dehydration   . Difficulty walking   . Pulmonary infiltrates 07/16/2014  . Chronic combined systolic and diastolic heart failure ([place]) 05/31/2014  . Essential hypertension 05/31/2014  .  H/O Right inguinal hernia repair 01/15/2012  . Small fat containing right inguinal hernia that has been painful 08/05/2011  . Finger wound, simple, open 10/12/2008  . Benign prostatic hyperplasia without urinary obstruction 04/30/2008  . Synovitis and tenosynovitis 04/30/2008  . HLD (hyperlipidemia) 04/12/2008  . COLONIC POLYPS 11/21/2007  . MI 11/21/2007  . Coronary atherosclerosis 11/21/2007  . GERD 11/21/2007  . Rheumatoid arthritis ([place]) 11/21/2007  . SLEEP APNEA, MILD 11/21/2007    Orientation RESPIRATION BLADDER Height & Weight     Self  O2 Incontinent Weight: 178 lb 9.2 oz (81 kg) Height:  6' 1"  (185.4 cm)  BEHAVIORAL SYMPTOMS/MOOD NEUROLOGICAL BOWEL NUTRITION STATUS      Incontinent    AMBULATORY STATUS COMMUNICATION OF NEEDS Skin   Extensive Assist Verbally  Personal Care Assistance Level of Assistance  Dressing, Feeding, Bathing Bathing Assistance: Maximum assistance   Dressing Assistance: Maximum assistance     Functional Limitations Info  Sight          SPECIAL CARE  FACTORS FREQUENCY                       Contractures Contractures Info: Present    Additional Factors Info  Code Status Code Status Info: DNR Allergies Info: NKDA           Current Medications (08/23/2020):  This is the current hospital active medication list Current Facility-Administered Medications  Medication Dose Route Frequency Provider Last Rate Last Admin  . donepezil (ARICEPT) tablet 5 mg  5 mg Oral QHS Bonnell Public Tublu, MD   5 mg at 08/22/20 2143  . feeding supplement (NEPRO CARB STEADY) liquid 237 mL  237 mL Oral TID BM Little Ishikawa, MD   237 mL at 08/23/20 1432  . guaiFENesin-dextromethorphan (ROBITUSSIN DM) 100-10 MG/5ML syrup 5 mL  5 mL Oral Q4H PRN Little Ishikawa, MD   5 mL at 08/21/20 1937  . heparin injection 5,000 Units  5,000 Units Subcutaneous Q8H Little Ishikawa, MD   5,000 Units at 08/23/20 1425  . insulin aspart (novoLOG) injection 0-20 Units  0-20 Units Subcutaneous Q4H Vashti Hey, MD   4 Units at 08/23/20 1202  . insulin detemir (LEVEMIR) injection 12 Units  12 Units Subcutaneous BID Vashti Hey, MD   12 Units at 08/23/20 [phone]  . ipratropium-albuterol (DUONEB) 0.5-2.5 (3) MG/3ML nebulizer solution 3 mL  3 mL Nebulization Q2H PRN Little Ishikawa, MD      . lactated ringers infusion   Intravenous Continuous Nita Sells, MD 50 mL/hr at 08/23/20 1117 Rate Change at 08/23/20 1117  . mesalamine (LIALDA) EC tablet 2.4 g  2.4 g Oral BID Bonnell Public Tublu, MD   2.4 g at 08/23/20 0943  . methylPREDNISolone sodium succinate (SOLU-MEDROL) 40 mg/mL injection 40 mg  40 mg Intravenous Q12H Little Ishikawa, MD   40 mg at 08/23/20 0636  . multivitamin with minerals tablet 1 tablet  1 tablet Oral Daily Little Ishikawa, MD   1 tablet at 08/23/20 [phone]  . nitroGLYCERIN (NITROSTAT) SL tablet 0.4 mg  0.4 mg Sublingual Q5 min PRN Bonnell Public Tublu, MD      . nystatin (MYCOSTATIN)  100000 UNIT/ML suspension 500,000 Units  5 mL Oral QID Little Ishikawa, MD   500,000 Units at 08/23/20 1425  . pantoprazole (PROTONIX) EC tablet 40 mg  40 mg Oral Daily Bonnell Public Tublu, MD   40 mg at 08/23/20 0944  . piperacillin-tazobactam (ZOSYN) IVPB 3.375 g  3.375 g Intravenous Q8H Holli Humbles C, MD 12.5 mL/hr at 08/23/20 1431 3.375 g at 08/23/20 1431  . pravastatin (PRAVACHOL) tablet 40 mg  40 mg Oral QHS Bonnell Public Tublu, MD   40 mg at 08/22/20 2143  . Resource ThickenUp Clear   Oral PRN Little Ishikawa, MD         Discharge Medications: Please see discharge summary for a list of discharge medications.  Relevant Imaging Results:  Relevant Lab Results:   Additional Information [id number]  Amador Cunas, [place]

## 2020-08-23 NOTE — Progress Notes (Signed)
Late Sports administrator [facility])  Received request from [facility] for hospice services at home after discharge.  Chart and pt information under review by [facility] physician.  Hospice eligibility pending at this time.  Hospital liaison spoke with [facility], daughter,  to initiate education related to hospice philosophy and services and to answer any questions at this time.  [name] verbalized understanding of information given, reported familiarity with hospice with he mother currently receiving hospices services at a facility in [place].  Per discussion the plan is to discharge back to [facility] possibly on Sunday.   [place] send signed and completed DNR home with pt/family.  Please provide prescriptions at discharge as needed to ensure ongoing symptom management until pt can be admitted onto hospice services.    DME needs discussed. Family denies any new DME needs at this time.  ACC information and contact numbers given to [facility].  Above information shared withJosie, TOC Manager.  Please call with any questions or concerns.  Thank you for the opportunity to participate in this pt's care.  Domenic Moras, BSN, RN [employer] [phone] (24h on call)

## 2020-08-23 NOTE — TOC Initial Note (Signed)
Transition of Care [facility]) - Initial/Assessment Note    Patient Details  Name: Anthony [name] MRN: [id number] Date of Birth: [birth year]  Transition of Care [facility]) CM/SW Contact:    Amador Cunas, [place] Phone Number: 08/23/2020, 4:14 PM  Clinical Narrative:  Pt from [facility] where he is a LTC/SNF resident. Pt's dtr [name] has met with Palliative Care and plans for pt to return to [facility] with hospice services and is requesting Manufacturing engineer ([place]). Referral made to [place] with [place] who will f/u with pt's dtr [name]. SW will follow.   Wandra Feinstein, MSW, [place] (coverage)      Expected Discharge Plan: [place] Barriers to Discharge: Continued Medical Work up   Patient Goals and CMS Choice        Expected Discharge Plan and Services Expected Discharge Plan: [place] Acute Care Choice: [place] Living arrangements for the past 2 months: [place]                                      Prior Living Arrangements/Services Living arrangements for the past 2 months: [place] Lives with:: Facility Resident Patient language and need for interpreter reviewed:: No Do you feel safe going back to the place where you live?: Yes      Need for Family Participation in Patient Care: Yes (Comment) Care giver support system in place?: Yes (comment)   Criminal Activity/Legal Involvement Pertinent to Current Situation/Hospitalization: No - Comment as needed  Activities of Daily Living Home Assistive Devices/Equipment: Other (Comment) ADL Screening (condition at time of admission) Patient's cognitive ability adequate to safely complete daily activities?: No Is the patient deaf or have difficulty hearing?: No Does the patient have difficulty seeing, even when wearing glasses/contacts?: No Does the patient have difficulty concentrating, remembering, or  making decisions?: Yes Patient able to express need for assistance with ADLs?: Yes Does the patient have difficulty dressing or bathing?: Yes Independently performs ADLs?: No Is this a change from baseline?: Pre-admission baseline Does the patient have difficulty walking or climbing stairs?: Yes Weakness of Legs: Both Weakness of Arms/Hands: Both  Permission Sought/Granted Permission sought to share information with : Chartered certified accountant granted to share information with : Yes, Verbal Permission Granted     Permission granted to share info w AGENCY: Manufacturing engineer, [employer]        Emotional Assessment       Orientation: : Fluctuating Orientation (Suspected and/or reported [place])      Admission diagnosis:  Sepsis due to pneumonia ([place]) [J18.9, A41.9] Community acquired pneumonia, unspecified laterality [J18.9] Patient Active Problem List   Diagnosis Date Noted  . Palliative care by specialist   . Goals of care, counseling/discussion   . Acute metabolic encephalopathy 93/73/4287  . Failure to thrive in adult 08/20/2020  . Unspecified protein-calorie malnutrition ([place]) 08/20/2020  . Sepsis due to pneumonia ([place]) 08/19/2020  . Acute respiratory disease due to COVID-19 virus 07/25/2020  . Hypokalemia 03/23/2020  . S/P hip hemiarthroplasty 04/19/2019  . Pressure injury of skin 03/05/2019  . Suspected COVID-19 virus infection 03/01/2019  . Acute respiratory failure with hypoxia ([place]) 03/01/2019  . Hip fracture ([place]) 03/01/2019  . Fall 03/01/2019  . Displaced fracture of base of neck of left femur, initial encounter for closed fracture ([place])   . Fever   .  Nonspecific interstitial pneumonia ([place])   . Febrile illness   . Thrush   . Hypoxia   . Sepsis ([place]) 10/04/2018  . Fatty liver 04/06/2018  . Acute pancreatitis 04/04/2018  . Abdominal pain, right lower quadrant 05/28/2015  . Absolute anemia 05/28/2015  . Aortic  atherosclerosis ([place]) 05/28/2015  . Altered blood in stool 05/28/2015  . Chronic systolic heart failure ([place]) 05/28/2015  . Syncope and collapse 05/28/2015  . Arteriosclerosis of coronary artery 05/28/2015  . Cough 05/28/2015  . Crohn's disease of large bowel ([place]) 05/28/2015  . Diarrhea 05/28/2015  . Displacement of cervical intervertebral disc without myelopathy 05/28/2015  . Benign essential HTN 05/28/2015  . Adenopathy 05/28/2015  . Hypercholesterolemia without hypertriglyceridemia 05/28/2015  . Hypo-osmolality and hyponatremia 05/28/2015  . Postinflammatory pulmonary fibrosis ([place]) 05/28/2015  . Healed myocardial infarct 05/28/2015  . Peptic esophagitis 05/28/2015  . 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 Paroxysmal ventricular tachycardia ([place]) 05/28/2015  . Decreased body weight 05/28/2015  . Weakness 11/05/2014  . Nausea with vomiting 11/05/2014  . Generalized weakness   . Dehydration   . Difficulty walking   . Pulmonary infiltrates 07/16/2014  . Chronic combined systolic and diastolic heart failure ([place]) 05/31/2014  . Essential hypertension 05/31/2014  .  H/O Right inguinal hernia repair 01/15/2012  . Small fat containing right inguinal hernia that has been painful 08/05/2011  . Finger wound, simple, open 10/12/2008  . Benign prostatic hyperplasia without urinary obstruction 04/30/2008  . Synovitis and tenosynovitis 04/30/2008  . HLD (hyperlipidemia) 04/12/2008  . COLONIC POLYPS 11/21/2007  . MI 11/21/2007  . Coronary atherosclerosis 11/21/2007  . GERD 11/21/2007  . Rheumatoid arthritis ([place]) 11/21/2007  . SLEEP APNEA, MILD 11/21/2007   PCP:  Josetta Huddle, MD Pharmacy:  No Pharmacies Listed    Social Determinants of Health (SDOH) Interventions    Readmission Risk Interventions Readmission Risk Prevention Plan 03/25/2020 03/06/2019  Transportation Screening Complete Complete  PCP or Specialist Appt within 3-5 Days Complete -  HRI or Home Care Consult Complete -  Social  Work Consult for [place] Planning/Counseling Complete -  Palliative Care Screening Not Applicable -  Medication Review Press photographer) Complete Complete  PCP or Specialist appointment within 3-5 days of discharge - Complete  HRI or [place] - Complete  SW Recovery Care/Counseling Consult - Complete  [place] - Complete  Some recent data might be hidden
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
## 2020-08-23 NOTE — Progress Notes (Signed)
PROGRESS NOTE    Anthony [name]  ZWC:[id number] DOB: 11-27-1933 DOA: 08/19/2020 PCP: Josetta Huddle, MD  Brief Narrative:  86 year old skilled nursing resident white male Rheumatoid arthritis on chronic prednisone-previously on methotrexate HFrEF with CAD NSVT Crohn's disease Long-term memory issues and dementia Hospitalized 9/2 through 07/29/2020 for acute respiratory distress secondary to coronavirus 19-at that admission found to have steroid-induced hyperglycemia  Evaluated in the emergency room 9/27 secondary to less responsiveness than prior with worsening of his baseline dementia On admission found to have WBC 15.1 lactic acidosis 2.9 creatinine 1.3 (baseline 0.9) CXR?  Pneumonia  Assessment & Plan:   Principal Problem:   Sepsis due to pneumonia [facility]) Active Problems:   Benign prostatic hyperplasia without urinary obstruction   Chronic combined systolic and diastolic heart failure (HCC)   Benign essential HTN   Thrush   Acute metabolic encephalopathy   Failure to thrive in adult   Unspecified protein-calorie malnutrition ([place])   1. Toxic metabolic encephalopathy secondary to infection a.  seems improved after treatment with antibiotics and minimization of polypharmacy b. Monitor trends, continue Aricept at bedtime 2. HCAP versus aspiration a. Continue Zosyn for 1 more day and then transition to Augmentin on 10/2 to complete likely on 08/27/19/2021 7 days b. Periodic chest x-ray as needed c. Speech therapy seen the patient and recommend dysphagia 1 nectar thick liquids and will need to continue this at skilled 3. Persistent hyperglycemia with previous steroid-induced hyperglycemia 4. Continues on Solu-Medrol 40 IV twice daily a. Blood sugars are ranging from 1 18-1 99 b. Continuing Levemir 12 units c. Adjust as needed glycemic control in the next several days 5. AKI a. Significantly improved from admission continuing saline with at lower rate of 50 cc/h b. Check labs  again in a.m. reviewed 6. Prior rheumatoid arthritis Crohn's disease-previous methotrexate/prednisone a. Patient resumed on Lialda 2.4 twice daily b. Not currently on methotrexate 7. HFrEF and HTN in the setting of prior CAD a. Continue secondary prevention Pravachol 40, Nitrostat b. Family insistent on patient being on telemetry which has been reordered 8. BPH a. Continue Flomax 0.4 at bedtime 9. Polypharmacy a. Multiple prior to admission meds have been held which contribute to polypharmacy including primidone 50 at bedtime melatonin 6 at bedtime  DVT prophylaxis: Lovenox Code Status: Full Family Communication: Discussed with daughter at the bedside Disposition:   Status is: Inpatient  Remains inpatient appropriate because:Hemodynamically unstable, Persistent severe electrolyte disturbances and Altered mental status   Dispo: The patient is from: Home              Anticipated d/c is to: SNF              Anticipated d/c date is: 3 days              Patient currently is not medically stable to d/c.       Consultants:   Multiple  Procedures: None  Antimicrobials: Zosyn   Subjective: Awake alert somewhat coherent no distress Seems to become somnolent however. No chest pain no fever Seems very weak bilaterally unable to raise legs off bed  Objective: Vitals:   08/22/20 1500 08/22/20 2029 08/23/20 0454 08/23/20 0742  BP: (!) 102/46 (!) 106/55 (!) 108/58 109/67  Pulse: (!) 54 (!) 50 60 (!) 56  Resp: 17 17  15   Temp: 98 F (36.7 C) (!) 97.5 F (36.4 C) (!) 97.3 F (36.3 C) (!) 97.5 F (36.4 C)  TempSrc: Oral Axillary Oral Oral  SpO2: 94%  96% 91% 93%  Weight:      Height:        Intake/Output Summary (Last 24 hours) at 08/23/2020 0748 Last data filed at 08/23/2020 0020 Gross per 24 hour  Intake 120 ml  Output 250 ml  Net -130 ml   Filed Weights   08/19/20 1421  Weight: 81 kg    Examination:  General exam: EOMI NCAT no focal deficit--motor is limited  by effort- Chest anterior lung fields clear Respiratory system: As above Cardiovascular system: S1-S2 no murmur rub or gallop Gastrointestinal system: Soft nontender no rebound no guarding. Central nervous system: Neurologically intact no focal deficit but movement limited by power Extremities: No lower extremity edema Skin: No rash no edema Psychiatry: Euthymic congruent  Data Reviewed: I have personally reviewed following labs and imaging studies   Radiology Studies: No results found. BUNs/creatinine 22/0.8 White count 21.2-->14.6  Scheduled Meds: . donepezil  5 mg Oral QHS  . feeding supplement (NEPRO CARB STEADY)  237 mL Oral TID BM  . heparin  5,000 Units Subcutaneous Q8H  . insulin aspart  0-20 Units Subcutaneous Q4H  . insulin detemir  12 Units Subcutaneous BID  . mesalamine  2.4 g Oral BID  . methylPREDNISolone (SOLU-MEDROL) injection  40 mg Intravenous Q12H  . multivitamin with minerals  1 tablet Oral Daily  . nystatin  5 mL Oral QID  . pantoprazole  40 mg Oral Daily  . pravastatin  40 mg Oral QHS   Continuous Infusions: . lactated ringers 100 mL/hr at 08/22/20 1747  . piperacillin-tazobactam 3.375 g (08/23/20 0636)     LOS: 4 days    Time spent: 25  Nita Sells, MD [facility] To contact the attending provider between 7A-7P or the covering provider during after hours 7P-7A, please log into the web site [email or web address] and access using universal [place] password for that web site. If you do not have the password, please call the hospital operator.  08/23/2020, 7:48 AM

## 2020-08-24 DIAGNOSIS — Z7189 Other specified counseling: Secondary | ICD-10-CM | POA: Diagnosis not present

## 2020-08-24 DIAGNOSIS — A419 Sepsis, unspecified organism: Secondary | ICD-10-CM | POA: Diagnosis not present

## 2020-08-24 DIAGNOSIS — J189 Pneumonia, unspecified organism: Secondary | ICD-10-CM | POA: Diagnosis not present

## 2020-08-24 DIAGNOSIS — Z515 Encounter for palliative care: Secondary | ICD-10-CM | POA: Diagnosis not present

## 2020-08-24 LAB — COMPREHENSIVE METABOLIC PANEL
ALT: 34 U/L (ref 0–44)
AST: 23 U/L (ref 15–41)
Albumin: 2 g/dL — ABNORMAL LOW (ref 3.5–5.0)
Alkaline Phosphatase: 62 U/L (ref 38–126)
Anion gap: 10 (ref 5–15)
BUN: 19 mg/dL (ref 8–23)
CO2: 26 mmol/L (ref 22–32)
Calcium: 8.4 mg/dL — ABNORMAL LOW (ref 8.9–10.3)
Chloride: 104 mmol/L (ref 98–111)
Creatinine, Ser: 0.9 mg/dL (ref 0.61–1.24)
GFR calc Af Amer: >60 mL/min (ref >60)
GFR calc non Af Amer: >60 mL/min (ref >60)
Glucose, Bld: 147 mg/dL — ABNORMAL HIGH (ref 70–99)
Potassium: 3.5 mmol/L (ref 3.5–5.1)
Sodium: 140 mmol/L (ref 135–145)
Total Bilirubin: 0.6 mg/dL (ref 0.3–1.2)
Total Protein: 5.2 g/dL — ABNORMAL LOW (ref 6.5–8.1)

## 2020-08-24 LAB — CBC
HCT: 41.4 % (ref 39.0–52.0)
Hemoglobin: 13.2 g/dL (ref 13.0–17.0)
MCH: 29.3 pg (ref 26.0–34.0)
MCHC: 31.9 g/dL (ref 30.0–36.0)
MCV: 91.8 fL (ref 80.0–100.0)
Platelets: 173 10*3/uL (ref 150–400)
RBC: 4.51 MIL/uL (ref 4.22–5.81)
RDW: 14.8 % (ref 11.5–15.5)
WBC: 12.4 10*3/uL — ABNORMAL HIGH (ref 4.0–10.5)
nRBC: 0.2 % (ref 0.0–0.2)

## 2020-08-24 LAB — GLUCOSE, CAPILLARY
Glucose-Capillary: 141 mg/dL — ABNORMAL HIGH (ref 70–99)
Glucose-Capillary: 142 mg/dL — ABNORMAL HIGH (ref 70–99)
Glucose-Capillary: 162 mg/dL — ABNORMAL HIGH (ref 70–99)
Glucose-Capillary: 215 mg/dL — ABNORMAL HIGH (ref 70–99)

## 2020-08-24 LAB — CULTURE, BLOOD (ROUTINE X 2)
Culture: NO GROWTH
Culture: NO GROWTH
Special Requests: ADEQUATE

## 2020-08-24 MED ORDER — LORAZEPAM 0.5 MG PO TABS
0.5000 mg | ORAL_TABLET | Freq: Every day | ORAL | Status: DC
  Administered 2020-08-25 – 2020-08-26 (×3): 0.5 mg via ORAL
  Filled 2020-08-24 (×3): qty 1

## 2020-08-24 MED ORDER — INSULIN DETEMIR 100 UNIT/ML ~~LOC~~ SOLN
6.0000 [IU] | Freq: Two times a day (BID) | SUBCUTANEOUS | Status: DC
  Administered 2020-08-24: 6 [IU] via SUBCUTANEOUS
  Filled 2020-08-24 (×3): qty 0.06

## 2020-08-24 MED ORDER — INSULIN ASPART 100 UNIT/ML ~~LOC~~ SOLN
0.0000 [IU] | Freq: Three times a day (TID) | SUBCUTANEOUS | Status: DC
  Administered 2020-08-25: 3 [IU] via SUBCUTANEOUS
  Administered 2020-08-25: 2 [IU] via SUBCUTANEOUS
  Administered 2020-08-25: 5 [IU] via SUBCUTANEOUS
  Administered 2020-08-26: 3 [IU] via SUBCUTANEOUS
  Administered 2020-08-26: 5 [IU] via SUBCUTANEOUS
  Administered 2020-08-26 – 2020-08-27 (×2): 3 [IU] via SUBCUTANEOUS

## 2020-08-24 MED ORDER — PREDNISONE 20 MG PO TABS
60.0000 mg | ORAL_TABLET | Freq: Every day | ORAL | Status: DC
  Administered 2020-08-25 – 2020-08-27 (×3): 60 mg via ORAL
  Filled 2020-08-24: qty 6
  Filled 2020-08-24 (×2): qty 3

## 2020-08-24 NOTE — TOC Progression Note (Addendum)
Transition of Care [facility]) - Progression Note    Patient Details  Name: Anthony [name] MRN: [id number] Date of Birth: 08-27-1934  Transition of Care [facility]) CM/SW [place], [place] Phone Number: 08/24/2020, 10:50 AM  Clinical Narrative:    CSW received a consult from [facility] in palliative to speak with family concerning SNF/ Hospice.   CSW met with pt and pt's daughter Anthony [name] at bedside with Anthony [name] on speaker phone. Pt will return to [facility] with hospice at disposition. CSW is waiting a return call from charge RN at facility.  11:11am: CSW received return phone call from facility.  Pt's SNF does not have admission coordinator on the weekend to admit pt. Contact Elyse Hsu at ext [phone] on Monday. TOC Team will continue to assist with disposition planning.   Expected Discharge Plan: Skilled Nursing Facility Barriers to Discharge: Continued Medical Work up  Expected Discharge Plan and Services Expected Discharge Plan: [place] Choice: [place] arrangements for the past 2 months: [place]                                       Social Determinants of Health (SDOH) Interventions    Readmission Risk Interventions Readmission Risk Prevention Plan 03/25/2020 03/06/2019  Transportation Screening Complete Complete  PCP or Specialist Appt within 3-5 Days Complete -  HRI or Home Care Consult Complete -  Social Work Consult for [place] Planning/Counseling Complete -  Palliative Care Screening Not Applicable -  Medication Review Press photographer) Complete Complete  PCP or Specialist appointment within 3-5 days of discharge - Complete  HRI or [place] - Complete  SW Recovery Care/Counseling Consult - Complete  [place] - Complete  Some recent data might be hidden

## 2020-08-24 NOTE — Progress Notes (Signed)
Manufacturing engineer [facility])  Received request from [facility] for hospice services at home after discharge.  Chart and pt information under review by [facility] physician.  Hospice eligibility approved.   Hospital liaison spoke with [facility], daughter,  to initiate education related to hospice philosophy and services and to answer any questions at this time. [name] verbalized understanding of information given, reported familiarity with hospice with he mother currently receiving hospices services at a facility in [place]. Per discussion the plan is to discharge back to [facility] possibly on Sunday. [name] would like sister [name] to be at bedside with patient once discharged to [facility] for [facility] initial assessment visit.   Please send signed and completed DNR home with pt/family.  Please provide prescriptions at discharge as needed to ensure ongoing symptom management until pt can be admitted onto hospice services.    DME needs discussed. Family denies any new DME needs at this time.  Thank you for the opportunity to participate in this pt's care.  Clementeen Hoof, BSN, [facility] (in [place]) [phone]

## 2020-08-24 NOTE — Evaluation (Signed)
Occupational Therapy Evaluation Patient Details Name: Anthony [name] MRN: [id number] DOB: 14-Feb-1934 Today's Date: 08/24/2020    History of Present Illness Anthony [name] is a 86 y.o. male with medical history significant for rheumatoid arthritis, systolic heart failure, CAD, Crohn's disease, dementia, GERD, hyperlipidemia, as well as recent Covid infection more than 21 days ago here at [facility] with moderate improvement but general decline at SNF over the past few weeks. Pt returns to [facility] with sepsis due to PNA.    Clinical Impression   PTA pt residing at [facility] reports not having been out of bed/had therapy in the last month (since having COVID). Daughter present to confirm and provide detailed history due to pts baseline Alzheimer's disease. At time of eval, pt able to complete bed mobility with max A +2 and maintain sitting EOB ~5 mins with mod-max-total A due to fatigue. Once back in bed, pt incontinent of stool- requiring max A +2 to roll in bed and total A for peri care. Given current status, recommend pt return to SNF and receive therapy services to regain strength and ADL independence. Will continue to follow per POC listed below.    Follow Up Recommendations  SNF;Supervision/Assistance - 24 hour    Equipment Recommendations  None recommended by OT    Recommendations for Other Services       Precautions / Restrictions Precautions Precautions: Fall Precaution Comments: pt's daughter reports that pt fell once at [facility] and once at [facility], since he had covid Restrictions Weight Bearing Restrictions: No      Mobility Bed Mobility Overal bed mobility: Needs Assistance Bed Mobility: Supine to Sit;Sit to Supine;Rolling Rolling: Max assist;+2 for physical assistance   Supine to sit: Max assist;+2 for physical assistance Sit to supine: Total assist;+2 for physical assistance   General bed mobility comments: pt able to follow commands to reach for opposite rail and able  to hold self in SL with use of rail and mod A. Limited by generalized weakness and full body stiffness from being in bed. Max A +2 for LE's off EOB and trunk elevation into sitting. Max A for scooting hips to EOB though pt attempting to assist with use of UE's  Transfers                 General transfer comment: pt with fatigue and dizziness in sitting, not yet ready to attempt transfer    Balance Overall balance assessment: Needs assistance;History of Falls Sitting-balance support: Bilateral upper extremity supported;Feet supported Sitting balance-Leahy Scale: Poor Sitting balance - Comments: began needing mod A to maintain sitting with stiffness B hips and posterior lean. With fatigue progressed to max A and then total.  Postural control: Posterior lean                                 ADL either performed or assessed with clinical judgement   ADL                                         General ADL Comments: Pt is currently max-total A for all ADL. Pt incontinent of stool this session requiring max A +2 to roll in bed and total A for peri care. Was able to sit EOB this date with max A +2 and max-total sitting balance support. Has not been out  of bed in about a month     Vision Baseline Vision/History: Wears glasses Wears Glasses: At all times Patient Visual Report: No change from baseline       Perception     Praxis      Pertinent Vitals/Pain Pain Assessment: Faces Faces Pain Scale: Hurts little more Pain Location: perineal area Pain Descriptors / Indicators: Sore Pain Intervention(s): Limited activity within patient's tolerance;Monitored during session;Repositioned     Hand Dominance Right   Extremity/Trunk Assessment Upper Extremity Assessment Upper Extremity Assessment: Generalized weakness   Lower Extremity Assessment Lower Extremity Assessment: Generalized weakness   Cervical / Trunk Assessment Cervical / Trunk Assessment:  Kyphotic   Communication Communication Communication: No difficulties   Cognition Arousal/Alertness: Awake/alert Behavior During Therapy: WFL for tasks assessed/performed Overall Cognitive Status: History of cognitive impairments - at baseline                                 General Comments: baseline history of alzheimers. Is pleasant and conversive in session. Requires increased time and cueing to problem solve basic mobility tasks   General Comments  O2 sats mid 90's in sitting on RA, dropped to 88% in supine after session and O2 reapplied. HR 72 bpm. pt soiled of stool and urine. PT/OT cleaned pt and changed bedding, RN changed male purewick after session.     Exercises Exercises: General Lower Extremity General Exercises - Lower Extremity Ankle Circles/Pumps: AROM;Both;5 reps;Supine Long Arc Quad: AROM;Both;5 reps;Seated Heel Slides: AAROM;Both;5 reps;Supine Straight Leg Raises: AAROM;Both;5 reps;Supine   Shoulder Instructions      Home Living Family/patient expects to be discharged to:: Skilled nursing facility                                 Additional Comments: pt's daughter reports he has not had therapy at [facility] since last hospital admission      Prior Functioning/Environment Level of Independence: Needs assistance  Gait / Transfers Assistance Needed: hasn't been out of bed since he had covid in early Sept ADL's / Homemaking Assistance Needed: Requires assist for ADLs at baseline   Comments: was ambulatory May 2021        OT Problem List: Decreased strength;Decreased knowledge of use of DME or AE;Decreased knowledge of precautions;Decreased activity tolerance;Decreased cognition;Decreased safety awareness;Impaired balance (sitting and/or standing)      OT Treatment/Interventions: Self-care/ADL training;Therapeutic exercise;Patient/family education;Balance training;Energy conservation;Therapeutic activities;DME and/or AE  instruction;Cognitive remediation/compensation    OT Goals(Current goals can be found in the care plan section) Acute Rehab OT Goals Patient Stated Goal: none stated. Daughter would like pt to get therapy when he returns to SNF OT Goal Formulation: With patient/family Time For Goal Achievement: 09/07/20 Potential to Achieve Goals: Good  OT Frequency: Min 2X/week   Barriers to D/C:            Co-evaluation PT/OT/SLP Co-Evaluation/Treatment: Yes Reason for Co-Treatment: For patient/therapist safety;To address functional/ADL transfers PT goals addressed during session: Mobility/safety with mobility;Balance OT goals addressed during session: ADL's and self-care;Strengthening/ROM      AM-PAC OT "6 Clicks" Daily Activity     Outcome Measure Help from another person eating meals?: A Lot Help from another person taking care of personal grooming?: A Lot Help from another person toileting, which includes using toliet, bedpan, or urinal?: Total Help from another person bathing (including washing, rinsing, drying)?: Total Help  from another person to put on and taking off regular upper body clothing?: Total Help from another person to put on and taking off regular lower body clothing?: Total 6 Click Score: 8   End of Session Nurse Communication: Mobility status;Other (comment) (needs new male purewick)  Activity Tolerance: Patient tolerated treatment well Patient left: in bed;with call bell/phone within reach;with family/visitor present  OT Visit Diagnosis: Unsteadiness on feet (R26.81);Other abnormalities of gait and mobility (R26.89);Muscle weakness (generalized) (M62.81);Other symptoms and signs involving cognitive function                Time: 5072-2575 OT Time Calculation (min): 33 min Charges:  OT General Charges $OT Visit: 1 Visit OT Evaluation $OT Eval Moderate Complexity: Remy, MSOT, OTR/L [facility] Office Number: [phone] Pager:  [phone]  Zenovia Jarred 08/24/2020, 1:57 PM

## 2020-08-24 NOTE — Evaluation (Signed)
Physical Therapy Evaluation Patient Details Name: Anthony [name] MRN: [id number] DOB: 18-Nov-1934 Today's Date: 08/24/2020   History of Present Illness  Anthony [name] is a 86 y.o. male with medical history significant for rheumatoid arthritis, systolic heart failure, CAD, Crohn's disease, dementia, GERD, hyperlipidemia, as well as recent Covid infection more than 21 days ago here at [facility] with moderate improvement but general decline at SNF over the past few weeks. Pt returns to [facility] with sepsis due to PNA.   Clinical Impression  Pt admitted with above diagnosis. Pt daughter present during eval and relays that pt has not had therapy at SNF since he had covid and she would really like him to have it at d/c. PT/OT agree that he would benefit greatly from this. As pt has not been out of bed in a month, focused today on getting to EOB and tolerating upright position. Max A +2 for sup>sit and initial sitting. Pt fatigued in sitting and needed tot A to return to supine. Maintained sitting >10 mins.  Pt currently with functional limitations due to the deficits listed below (see PT Problem List). Pt will benefit from skilled PT to increase their independence and safety with mobility to allow discharge to the venue listed below.       Follow Up Recommendations SNF;Supervision/Assistance - 24 hour    Equipment Recommendations  None recommended by PT    Recommendations for Other Services       Precautions / Restrictions Precautions Precautions: Fall Precaution Comments: pt's daughter reports that pt fell once at [facility] and once at [facility], since he had covid Restrictions Weight Bearing Restrictions: No      Mobility  Bed Mobility Overal bed mobility: Needs Assistance Bed Mobility: Supine to Sit;Sit to Supine;Rolling Rolling: Max assist;+2 for physical assistance   Supine to sit: Max assist;+2 for physical assistance Sit to supine: Total assist;+2 for physical assistance   General  bed mobility comments: pt able to follow commands to reach for opposite rail and able to hold self in SL with use of rail and mod A. Limited by generalized weakness and full body stiffness from being in bed. Max A +2 for LE's off EOB and trunk elevation into sitting. Max A for scooting hips to EOB though pt attempting to assist with use of UE's  Transfers                 General transfer comment: pt with fatigue and dizziness in sitting, not yet ready to attempt transfer  Ambulation/Gait             General Gait Details: unable  Stairs            Wheelchair Mobility    Modified Rankin (Stroke Patients Only)       Balance Overall balance assessment: Needs assistance;History of Falls Sitting-balance support: Bilateral upper extremity supported;Feet supported Sitting balance-Leahy Scale: Poor Sitting balance - Comments: began needing mod A to maintain sitting with stiffness B hips and posterior lean. With fatigue progressed to max A and then total.  Postural control: Posterior lean                                   Pertinent Vitals/Pain Pain Assessment: Faces Faces Pain Scale: Hurts little more Pain Location: perineal area Pain Descriptors / Indicators: Sore Pain Intervention(s): Limited activity within patient's tolerance;Monitored during session    Home Living Family/patient expects  to be discharged to:: Skilled nursing facility                 Additional Comments: pt's daughter reports he has not had therapy at [facility] since last hospital admission    Prior Function Level of Independence: Needs assistance   Gait / Transfers Assistance Needed: hasn't been out of bed since he had covid in early Sept  ADL's / Homemaking Assistance Needed: Requires assist for ADLs at baseline  Comments: was ambulatory May 2021     Hand Dominance   Dominant Hand: Right    Extremity/Trunk Assessment   Upper Extremity Assessment Upper  Extremity Assessment: Defer to OT evaluation    Lower Extremity Assessment Lower Extremity Assessment: Generalized weakness    Cervical / Trunk Assessment Cervical / Trunk Assessment: Kyphotic  Communication   Communication: No difficulties  Cognition Arousal/Alertness: Awake/alert Behavior During Therapy: WFL for tasks assessed/performed Overall Cognitive Status: History of cognitive impairments - at baseline                                 General Comments: Alzheimers but can participate in conversation and aware of what is going on around him      General Comments General comments (skin integrity, edema, etc.): O2 sats mid 90's in sitting on RA, dropped to 88% in supine after session and O2 reapplied. HR 72 bpm. pt soiled of stool and urine. PT/OT cleaned pt and changed bedding, RN changed male purewick after session.     Exercises General Exercises - Lower Extremity Ankle Circles/Pumps: AROM;Both;5 reps;Supine Long Arc Quad: AROM;Both;5 reps;Seated Heel Slides: AAROM;Both;5 reps;Supine Straight Leg Raises: AAROM;Both;5 reps;Supine   Assessment/Plan    PT Assessment Patient needs continued PT services  PT Problem List Decreased strength;Decreased range of motion;Decreased activity tolerance;Decreased balance;Decreased mobility;Decreased cognition;Decreased knowledge of precautions;Pain;Decreased skin integrity;Cardiopulmonary status limiting activity       PT Treatment Interventions DME instruction;Functional mobility training;Therapeutic activities;Therapeutic exercise;Balance training;Neuromuscular re-education;Patient/family education;Cognitive remediation    PT Goals (Current goals can be found in the Care Plan section)  Acute Rehab PT Goals Patient Stated Goal: none stated. Daughter would like pt to get therapy when he returns to SNF PT Goal Formulation: With patient/family Time For Goal Achievement: 09/07/20 Potential to Achieve Goals: Fair     Frequency Min 2X/week   Barriers to discharge        Co-evaluation PT/OT/SLP Co-Evaluation/Treatment: Yes Reason for Co-Treatment: Complexity of the patient's impairments (multi-system involvement);Necessary to address cognition/behavior during functional activity;For patient/therapist safety PT goals addressed during session: Mobility/safety with mobility;Balance         AM-PAC PT "6 Clicks" Mobility  Outcome Measure Help needed turning from your back to your side while in a flat bed without using bedrails?: A Lot Help needed moving from lying on your back to sitting on the side of a flat bed without using bedrails?: Total Help needed moving to and from a bed to a chair (including a wheelchair)?: Total Help needed standing up from a chair using your arms (e.g., wheelchair or bedside chair)?: Total Help needed to walk in hospital room?: Total Help needed climbing 3-5 steps with a railing? : Total 6 Click Score: 7    End of Session Equipment Utilized During Treatment: Oxygen Activity Tolerance: Patient limited by fatigue Patient left: in bed;with call bell/phone within reach;with bed alarm set;with family/visitor present Nurse Communication: Mobility status PT Visit Diagnosis: History of falling (Z91.81);Muscle  weakness (generalized) (M62.81)    Time: 5301-0404 PT Time Calculation (min) (ACUTE ONLY): 44 min   Charges:   PT Evaluation $PT Eval Moderate Complexity: [place]  Pager [phone] Office [place] 08/24/2020, 11:47 AM

## 2020-08-24 NOTE — Progress Notes (Signed)
PROGRESS NOTE  Anthony [name] JKD:[id number] DOB: [birth year] DOA: 08/19/2020 PCP: Anthony Huddle, MD  Brief History   86 year old skilled nursing resident white male Rheumatoid arthritis on chronic prednisone-previously on methotrexate HFrEF with CAD NSVT Crohn's disease Long-term memory issues and dementia Hospitalized 9/2 through 07/29/2020 for acute respiratory distress secondary to coronavirus 19-at that admission found to have steroid-induced hyperglycemia  Evaluated in the emergency room 9/27 secondary to less responsiveness than prior with worsening of his baseline dementia On admission found to have WBC 15.1 lactic acidosis 2.9 creatinine 1.3 (baseline 0.9). CXR: demonstrated pneumonia.  The patient had bradycardia on 08/23/2020.   Plan is for the patient to discharge to [facility] with Hospice.  Consultants  . Palliative Care . Hospice  Antibiotics   Anti-infectives (From admission, onward)   Start     Dose/Rate Route Frequency Ordered Stop   08/19/20 2200  piperacillin-tazobactam (ZOSYN) IVPB 3.375 g        3.375 g 12.5 mL/hr over 240 Minutes Intravenous Every 8 hours 08/19/20 1832     08/19/20 1615  cefTRIAXone (ROCEPHIN) 1 g in sodium chloride 0.9 % 100 mL IVPB        1 g 200 mL/hr over 30 Minutes Intravenous  Once 08/19/20 1601 08/19/20 1912   08/19/20 1615  azithromycin (ZITHROMAX) 500 mg in sodium chloride 0.9 % 250 mL IVPB        500 mg 250 mL/hr over 60 Minutes Intravenous  Once 08/19/20 1601 08/19/20 1821    .   Subjective  The patient is resting in bed. No new complaints.  Objective   Vitals:  Vitals:   08/24/20 0440 08/24/20 1241  BP: (!) 114/52 137/78  Pulse: (!) 42 71  Resp: 14 18  Temp: (!) 97.4 F (36.3 C) 98.2 F (36.8 C)  SpO2: 97% 97%    Exam:  Constitutional:  . The patient is awake and alert. No acute distress. Respiratory:  . No increased work of breathing. . No wheezes, rales, or rhonchi . No tactile  fremitus Cardiovascular:  . Regular rate and rhythm, HR of 70. Marland Kitchen No murmurs, ectopy, or gallups. . No lateral PMI. No thrills. Abdomen:  . Abdomen is soft, non-tender, non-distended . No hernias, masses, or organomegaly . Normoactive bowel sounds.  Musculoskeletal:  . No cyanosis, clubbing, or edema Skin:  . No rashes, lesions, ulcers . palpation of skin: no induration or nodules Neurologic:  . CN 2-12 intact . Sensation all 4 extremities intact Psychiatric:  . Mental status o Mood, affect appropriate o Orientation to person, place, time  . judgment and insight appear intact  I have personally reviewed the following:   Today's Data  . Vitals, CBC, CMP  Scheduled Meds: . donepezil  5 mg Oral QHS  . feeding supplement (NEPRO CARB STEADY)  237 mL Oral TID BM  . heparin  5,000 Units Subcutaneous Q8H  . insulin aspart  0-20 Units Subcutaneous Q4H  . insulin detemir  12 Units Subcutaneous BID  . mesalamine  2.4 g Oral BID  . methylPREDNISolone (SOLU-MEDROL) injection  40 mg Intravenous Q12H  . multivitamin with minerals  1 tablet Oral Daily  . nystatin  5 mL Oral QID  . pantoprazole  40 mg Oral Daily  . pravastatin  40 mg Oral QHS   Continuous Infusions: . lactated ringers 50 mL/hr at 08/23/20 1117  . piperacillin-tazobactam 3.375 g (08/24/20 1517)    Principal Problem:   Sepsis due to pneumonia [facility]) Active Problems:  Benign prostatic hyperplasia without urinary obstruction   Chronic combined systolic and diastolic heart failure (HCC)   Benign essential HTN   Thrush   Acute metabolic encephalopathy   Failure to thrive in adult   Unspecified protein-calorie malnutrition ([place])   Palliative care by specialist   Goals of care, counseling/discussion   LOS: 5 days   A & P   Toxic metabolic encephalopathy secondary to infection a.  seems improved after treatment with antibiotics and minimization of polypharmacy b. Monitor trends, continue Aricept at bedtime HCAP  versus aspiration a. Continue Zosyn for 1 more day and then transition to Augmentin on 10/2 to complete likely on 08/27/19/2021 7 days b. Periodic chest x-ray as needed c. Speech therapy seen the patient and recommend dysphagia 1 nectar thick liquids and will need to continue this at skilled Persistent hyperglycemia with previous steroid-induced hyperglycemia 1. Taper off of IV solumedrol. Monitor sugars and adjust insulin.  2. AKI a. Significantly improved from admission continuing saline with at lower rate of 50 cc/h b. Check labs again in a.m. reviewed 3. Prior rheumatoid arthritis Crohn's disease-previous methotrexate/prednisone a. Patient resumed on Lialda 2.4 twice daily b. Not currently on methotrexate 4. HFrEF and HTN in the setting of prior CAD a. Continue secondary prevention Pravachol 40, Nitrostat b. Family insistent on patient being on telemetry which has been reordered 5. BPH a. Continue Flomax 0.4 at bedtime 6. Polypharmacy a. Multiple prior to admission meds have been held which contribute to polypharmacy including primidone 50 at bedtime melatonin 6 at bedtime  DVT prophylaxis: Lovenox Code Status: Full Family Communication: Discussed with daughter at the bedside Disposition: To SNF with Hospice when bed is available, and patient is appropriate for discharge from a medical standpoint.  Status is: Inpatient  Remains inpatient appropriate because:Need to taper steroids and adjust insulin coverage.  Dispo: The patient is from: Home  Anticipated d/c is to: SNF  Anticipated d/c date is: 3 days  Patient currently is notmedically stable to d/c, and is without safe discharge.   Anthony Gallacher, DO [facility] Direct contact: see [email or web address]  7PM-7AM contact night coverage as above 08/24/2020, 5:03 PM  LOS: 5 days

## 2020-08-24 NOTE — Progress Notes (Signed)
   Palliative Medicine Inpatient Follow Up Note  Reason for consult:  Goals Discussion  HPI:  Per intake H&P --> Anthony [name] a 86 y.o.malewith medical history significantfor rheumatoid arthritis, systolic heart failure, CAD, Crohn's disease, dementia, GERD, hyperlipidemia, as well as recent Covid infection more than 21 days ago here at [facility] with moderate improvement but general decline at SNF over the past few weeks. Patient was evaluated at SNF and found to be less responsive than previous baseline although given his worsening status and baseline dementia it is unclear whether or not he had any acute changes rather than general decline. Daughter was contacted for further information and indicates no acute changes to the best of her knowledge but simply a further decline in patient's status and poor p.o. intake over the past few days. In the ED patient was evaluated noted to be afebrile, satting 99 to 100% on 2 L nasal cannula which was his discharge oxygen level from previous hospitalization. His labs remarkable for mild leukocytosis of 15.1 with elevated lactic acidosis at 2.9 and elevated creatinine at 1.3 with baseline creatinine previously around 0.9. Chest x-ray was concerning for pneumonia and hospitalist was called to admit for community-acquired pneumonia in the setting of sepsis and mental status changes.  Palliative care was asked to consult in the setting of ongoing failure to thrive.  Today's Discussion (08/24/2020): Chart reviewed. Patient resting in bed this morning. Patients daughter, [name] present at bedside. We reviewed that Anthony [name] has been and remains to have very little PO intake. She shares that she has been trying to provide food and water but that Anthony [name] is taking in very little. [name] shares that she plans to be at bedside throughout the day which I informed floor staff of.   Patient should transition back to [facility] on [address]  as early as this weekend.   Offered support through therapeutic listening.  Discussed the importance of continued conversation with family and their  medical providers regarding overall plan of care and treatment options, ensuring decisions are within the context of the patients values and GOCs.  Questions and concerns addressed   Decision Maker: [name] (Daughter) [phone]  SUMMARY OF RECOMMENDATIONS DNAR/DNI  Gold DNR placed on front of chart  [place]: Transfer back to [facility] on [address]  Time Spent: 25 Greater than 50% of the time was spent in counseling and coordination of care ______________________________________________________________________________________ [place] Team Team Cell Phone: [phone] Please utilize secure chat with additional questions, if there is no response within 30 minutes please call the above phone number  Palliative Medicine Team providers are available by phone from 7am to 7pm daily and can be reached through the team cell phone.  Should this patient require assistance outside of these hours, please call the patient's attending physician.

## 2020-08-24 NOTE — Plan of Care (Signed)
  Problem: Pain Managment: Goal: General experience of comfort will improve Outcome: Progressing   Problem: Safety: Goal: Ability to remain free from injury will improve Outcome: Progressing   Problem: Skin Integrity: Goal: Risk for impaired skin integrity will decrease Outcome: Progressing   

## 2020-08-25 DIAGNOSIS — Z20822 Contact with and (suspected) exposure to covid-19: Secondary | ICD-10-CM | POA: Diagnosis not present

## 2020-08-25 DIAGNOSIS — E43 Unspecified severe protein-calorie malnutrition: Secondary | ICD-10-CM | POA: Diagnosis not present

## 2020-08-25 DIAGNOSIS — J189 Pneumonia, unspecified organism: Secondary | ICD-10-CM | POA: Diagnosis not present

## 2020-08-25 DIAGNOSIS — Z515 Encounter for palliative care: Secondary | ICD-10-CM | POA: Diagnosis not present

## 2020-08-25 DIAGNOSIS — A419 Sepsis, unspecified organism: Secondary | ICD-10-CM | POA: Diagnosis not present

## 2020-08-25 DIAGNOSIS — Z7189 Other specified counseling: Secondary | ICD-10-CM | POA: Diagnosis not present

## 2020-08-25 LAB — GLUCOSE, CAPILLARY
Glucose-Capillary: 61 mg/dL — ABNORMAL LOW (ref 70–99)
Glucose-Capillary: 121 mg/dL — ABNORMAL HIGH (ref 70–99)
Glucose-Capillary: 248 mg/dL — ABNORMAL HIGH (ref 70–99)
Glucose-Capillary: 181 mg/dL — ABNORMAL HIGH (ref 70–99)
Glucose-Capillary: 228 mg/dL — ABNORMAL HIGH (ref 70–99)

## 2020-08-25 LAB — COMPREHENSIVE METABOLIC PANEL
ALT: 31 U/L (ref 0–44)
AST: 19 U/L (ref 15–41)
Albumin: 1.9 g/dL — ABNORMAL LOW (ref 3.5–5.0)
Alkaline Phosphatase: 65 U/L (ref 38–126)
Anion gap: 10 (ref 5–15)
BUN: 17 mg/dL (ref 8–23)
CO2: 24 mmol/L (ref 22–32)
Calcium: 8.2 mg/dL — ABNORMAL LOW (ref 8.9–10.3)
Chloride: 103 mmol/L (ref 98–111)
Creatinine, Ser: 0.78 mg/dL (ref 0.61–1.24)
GFR calc Af Amer: >60 mL/min (ref >60)
GFR calc non Af Amer: >60 mL/min (ref >60)
Glucose, Bld: 143 mg/dL — ABNORMAL HIGH (ref 70–99)
Potassium: 3.4 mmol/L — ABNORMAL LOW (ref 3.5–5.1)
Sodium: 137 mmol/L (ref 135–145)
Total Bilirubin: 0.7 mg/dL (ref 0.3–1.2)
Total Protein: 4.8 g/dL — ABNORMAL LOW (ref 6.5–8.1)

## 2020-08-25 MED ORDER — METOPROLOL TARTRATE 5 MG/5ML IV SOLN: 5.0000 mg | Freq: Three times a day (TID) | INTRAVENOUS | Status: DC

## 2020-08-25 MED ORDER — AMIODARONE IV BOLUS ONLY 150 MG/100ML
150.0000 mg | Freq: Once | INTRAVENOUS | Status: AC
  Administered 2020-08-25: 150 mg via INTRAVENOUS
  Filled 2020-08-25: qty 100

## 2020-08-25 MED ORDER — SODIUM CHLORIDE 0.9 % IV BOLUS
500.0000 mL | Freq: Once | INTRAVENOUS | Status: AC
  Administered 2020-08-25: 500 mL via INTRAVENOUS

## 2020-08-25 MED ORDER — CARVEDILOL 3.125 MG PO TABS
3.1250 mg | ORAL_TABLET | Freq: Two times a day (BID) | ORAL | Status: DC
  Administered 2020-08-25 – 2020-08-26 (×3): 3.125 mg via ORAL
  Filled 2020-08-25 (×4): qty 1

## 2020-08-25 MED ORDER — AMOXICILLIN-POT CLAVULANATE 875-125 MG PO TABS
1.0000 | ORAL_TABLET | Freq: Two times a day (BID) | ORAL | Status: DC
  Administered 2020-08-25 – 2020-08-27 (×4): 1 via ORAL
  Filled 2020-08-25 (×4): qty 1

## 2020-08-25 MED ORDER — LACTATED RINGERS IV BOLUS
500.0000 mL | Freq: Once | INTRAVENOUS | Status: AC
  Administered 2020-08-25: 500 mL via INTRAVENOUS

## 2020-08-25 MED ORDER — DEXTROSE 50 % IV SOLN
INTRAVENOUS | Status: AC
  Filled 2020-08-25: qty 50

## 2020-08-25 MED ORDER — ACETAMINOPHEN 325 MG PO TABS: 650.0000 mg | ORAL_TABLET | ORAL | Status: DC | PRN

## 2020-08-25 MED ORDER — AMIODARONE HCL IN DEXTROSE 360-4.14 MG/200ML-% IV SOLN
60.0000 mg/h | INTRAVENOUS | Status: DC
  Filled 2020-08-25: qty 200

## 2020-08-25 MED ORDER — AMIODARONE HCL IN DEXTROSE 360-4.14 MG/200ML-% IV SOLN
30.0000 mg/h | INTRAVENOUS | Status: DC
  Filled 2020-08-25: qty 200

## 2020-08-25 NOTE — Progress Notes (Signed)
Glucose 61 reported this morning- pt hard to arouse- gave 1/2 amp D50 -iv which brought him awake- followed up with a Boost Brezee- which he drank 3/4 of. notification sent to Dr. Benny Lennert.informed day shift RN to follow up.

## 2020-08-25 NOTE — Progress Notes (Signed)
   Palliative Medicine Inpatient Follow Up Note  Reason for consult:  Goals Discussion  HPI:  Per intake H&P --> Anthony [name] a 86 y.o.malewith medical history significantfor rheumatoid arthritis, systolic heart failure, CAD, Crohn's disease, dementia, GERD, hyperlipidemia, as well as recent Covid infection more than 21 days ago here at [facility] with moderate improvement but general decline at SNF over the past few weeks. Patient was evaluated at SNF and found to be less responsive than previous baseline although given his worsening status and baseline dementia it is unclear whether or not he had any acute changes rather than general decline. Daughter was contacted for further information and indicates no acute changes to the best of her knowledge but simply a further decline in patient's status and poor p.o. intake over the past few days. In the ED patient was evaluated noted to be afebrile, satting 99 to 100% on 2 L nasal cannula which was his discharge oxygen level from previous hospitalization. His labs remarkable for mild leukocytosis of 15.1 with elevated lactic acidosis at 2.9 and elevated creatinine at 1.3 with baseline creatinine previously around 0.9. Chest x-ray was concerning for pneumonia and hospitalist was called to admit for community-acquired pneumonia in the setting of sepsis and mental status changes.  Palliative care was asked to consult in the setting of ongoing failure to thrive.  Today's Discussion (08/25/2020): Chart reviewed. (+) hypoglycemic episode this morning given 1/2 amp of D50 w/ improvement. PO's remain to be exceptionally poor as per review of daily intake --> albumin this morning of 1.9. Ongoing FTT - hospice remains appropriate.   Patient resting in bed this morning exemplifies no nonverbal s/s of distress. Patients daughter, Anthony [name] sleeping at bedside.   Patient should transition back to [facility] on [address] as early as  this week.  Offered support through therapeutic listening.  Discussed the importance of continued conversation with family and their  medical providers regarding overall plan of care and treatment options, ensuring decisions are within the context of the patients values and GOCs.  Questions and concerns addressed   Decision Maker: Anthony [name] (Daughter) [phone]  SUMMARY OF RECOMMENDATIONS DNAR/DNI  Gold DNR placed on front of chart  [place]: Transfer back to [facility] on [address]  Time Spent: 15 Greater than 50% of the time was spent in counseling and coordination of care ______________________________________________________________________________________ [place] Team Team Cell Phone: [phone] Please utilize secure chat with additional questions, if there is no response within 30 minutes please call the above phone number  Palliative Medicine Team providers are available by phone from 7am to 7pm daily and can be reached through the team cell phone.  Should this patient require assistance outside of these hours, please call the patient's attending physician.

## 2020-08-25 NOTE — Progress Notes (Signed)
PROGRESS NOTE  Anthony [name] YKD:[id number] DOB: [birth year] DOA: 08/19/2020 PCP: Josetta Huddle, MD  Brief History   86 year old skilled nursing resident white male Rheumatoid arthritis on chronic prednisone-previously on methotrexate HFrEF with CAD NSVT Crohn's disease Long-term memory issues and dementia Hospitalized 9/2 through 07/29/2020 for acute respiratory distress secondary to coronavirus 19-at that admission found to have steroid-induced hyperglycemia  Evaluated in the emergency room 9/27 secondary to less responsiveness than prior with worsening of his baseline dementia On admission found to have WBC 15.1 lactic acidosis 2.9 creatinine 1.3 (baseline 0.9). CXR: demonstrated pneumonia.  The patient had bradycardia on 08/23/2020. Today the patient went into atrial fibrillation with hypotension and heart rate in the 130's. This was treated with IV fluid bolus and amiodarone bolus. As blood pressure has recovered, he has been restarted on Cored 3.125 bid.  Plan is for the patient to discharge to [facility] with Hospice.  Consultants  . Palliative Care . Hospice  Antibiotics   Anti-infectives (From admission, onward)   Start     Dose/Rate Route Frequency Ordered Stop   08/19/20 2200  piperacillin-tazobactam (ZOSYN) IVPB 3.375 g        3.375 g 12.5 mL/hr over 240 Minutes Intravenous Every 8 hours 08/19/20 1832     08/19/20 1615  cefTRIAXone (ROCEPHIN) 1 g in sodium chloride 0.9 % 100 mL IVPB        1 g 200 mL/hr over 30 Minutes Intravenous  Once 08/19/20 1601 08/19/20 1912   08/19/20 1615  azithromycin (ZITHROMAX) 500 mg in sodium chloride 0.9 % 250 mL IVPB        500 mg 250 mL/hr over 60 Minutes Intravenous  Once 08/19/20 1601 08/19/20 1821      Subjective  The patient is resting in bed. No new complaints.  Objective   Vitals:  Vitals:   08/25/20 1254 08/25/20 1613  BP: 105/63 105/63  Pulse: 69 75  Resp: 18   Temp: 98.1 F (36.7 C)   SpO2:       Exam:  Constitutional:  . The patient is awake and alert. No acute distress. Respiratory:  . No increased work of breathing. . No wheezes, rales, or rhonchi . No tactile fremitus Cardiovascular:  . Regular rate and rhythm, HR of 70. Marland Kitchen No murmurs, ectopy, or gallups. . No lateral PMI. No thrills. Abdomen:  . Abdomen is soft, non-tender, non-distended . No hernias, masses, or organomegaly . Normoactive bowel sounds.  Musculoskeletal:  . No cyanosis, clubbing, or edema Skin:  . No rashes, lesions, ulcers . palpation of skin: no induration or nodules Neurologic:  . CN 2-12 intact . Sensation all 4 extremities intact Psychiatric:  . Mental status o Mood, affect appropriate o Orientation to person, place, time  . judgment and insight appear intact  I have personally reviewed the following:   Today's Data  . Vitals, CMP  Scheduled Meds: . carvedilol  3.125 mg Oral BID WC  . dextrose      . donepezil  5 mg Oral QHS  . feeding supplement (NEPRO CARB STEADY)  237 mL Oral TID BM  . heparin  5,000 Units Subcutaneous Q8H  . insulin aspart  0-15 Units Subcutaneous TID WC  . LORazepam  0.5 mg Oral QHS  . mesalamine  2.4 g Oral BID  . multivitamin with minerals  1 tablet Oral Daily  . nystatin  5 mL Oral QID  . pantoprazole  40 mg Oral Daily  . pravastatin  40 mg Oral  QHS  . predniSONE  60 mg Oral Q breakfast   Continuous Infusions: . lactated ringers 75 mL/hr at 08/25/20 1619  . piperacillin-tazobactam 3.375 g (08/25/20 1414)    Principal Problem:   Sepsis due to pneumonia [facility]) Active Problems:   Benign prostatic hyperplasia without urinary obstruction   Chronic combined systolic and diastolic heart failure (HCC)   Benign essential HTN   Thrush   Acute metabolic encephalopathy   Failure to thrive in adult   Unspecified protein-calorie malnutrition ([place])   Palliative care by specialist   Goals of care, counseling/discussion   LOS: 6 days   A & P   Toxic  metabolic encephalopathy secondary to infection: Mental status seems improved after treatment with antibiotics and minimization of polypharmacy. Monitor trends, continue Aricept at bedtime.  HCAP versus aspiration: Zosyn stopped after 7 days of therapy. Augmentin has been started and will continue for another 7 days. Periodic chest x-ray as needed. Speech therapy seen the patient and recommend dysphagia 1 nectar thick liquids and will need to continue this at skilled.  Atrial fibrillation with RVR: Confirmed with EKG. The patient was placed on telemetry. He was given a bolus of amiodarone and bolus of IV fluids. He has converted back to NSR. Blood pressure improved after IV fluids. The patient has been restarted on coreg at 12/ the dose he had been on at home. Monitor.  Persistent hyperglycemia with previous steroid-induced hyperglycemia. The patient has been tapered off of IV solumedrol to oral prednisone daily. Monitor sugars and adjust insulin. Long acting insulin has been discontinued due to episode of hypoglycemia. Monitor.  AKI: Significantly improved from admission continuing saline with at lower rate of 50 cc/h. Check labs again in a.m. reviewed.  Prior rheumatoid arthritis Crohn's disease-previous methotrexate/prednisone:  Patient has been resumed on Lialda 2.4 twice daily. Not currently on methotrexate.  HFrEF and HTN in the setting of prior CAD: Continue secondary prevention Pravachol 40, Nitrostat. Family insistent on patient being on telemetry which has been reordered.  BPH: Continue Flomax 0.4 at bedtime.  Polypharmacy: Multiple prior to admission meds have been held which contribute to polypharmacy including primidone 50 at bedtime melatonin 6 at bedtime.  I have seen and examined this patient myself. I have spent 48 minutes in his evaluation and care.  DVT prophylaxis: Lovenox Code Status: Full Family Communication: Discussed with daughter at the bedside Disposition: To SNF  with Hospice when bed is available, and patient is appropriate for discharge from a medical standpoint.  Status is: Inpatient  Remains inpatient appropriate because:Need to taper steroids and adjust insulin coverage.  Dispo: The patient is from: Home  Anticipated d/c is to: SNF  Anticipated d/c date is: 3 days  Patient currently is notmedically stable to d/c, and is without safe discharge.   Tavia Stave, DO [facility] Direct contact: see [email or web address]  7PM-7AM contact night coverage as above 08/25/2020, 6:06 PM  LOS: 5 days

## 2020-08-25 NOTE — Progress Notes (Signed)
°   08/25/20 1253  Assess: MEWS Score  BP 99/62  Pulse Rate (!) 116  Resp 20  Level of Consciousness Alert  SpO2 97 %  O2 Device Nasal Cannula  O2 Flow Rate (L/min) 2 L/min  Assess: MEWS Score  MEWS Temp 0  MEWS Systolic 1  MEWS Pulse 2  MEWS RR 0  MEWS LOC 0  MEWS Score 3  MEWS Score Color Yellow  Assess: if the MEWS score is Yellow or Red  Were vital signs taken at a resting state? Yes  Focused Assessment No change from prior assessment  Early Detection of Sepsis Score *See Row Information* Low  MEWS guidelines implemented *See Row Information* Yes  Treat  MEWS Interventions Administered prn meds/treatments;Escalated (See documentation below)  Pain Scale 0-10  Pain Score 0  Take Vital Signs  Increase Vital Sign Frequency  Yellow: Q 2hr X 2 then Q 4hr X 2, if remains yellow, continue Q 4hrs  Escalate  MEWS: Escalate Yellow: discuss with charge nurse/RN and consider discussing with provider and RRT  Notify: Charge Nurse/RN  Name of Charge Nurse/RN Notified Sharyn Lull RN  Date Charge Nurse/RN Notified 08/25/20  Time Charge Nurse/RN Notified 1245  Notify: Provider  Provider Name/Title Swayze, Ava  Date Provider Notified 08/25/20  Time Provider Notified 1200  Notification Type Page  Notification Reason Change in status  Response See new orders  Date of Provider Response 08/25/20  Time of Provider Response 6754  Notify: Rapid Response  Name of Rapid Response RN Notified Hella RN  Date Rapid Response Notified 08/25/20  Time Rapid Response Notified 1205

## 2020-08-25 NOTE — Plan of Care (Signed)
  Problem: Education: Goal: Knowledge of General Education information will improve Description Including pain rating scale, medication(s)/side effects and non-pharmacologic comfort measures Outcome: Progressing   Problem: Health Behavior/Discharge Planning: Goal: Ability to manage health-related needs will improve Outcome: Progressing   

## 2020-08-26 DIAGNOSIS — A419 Sepsis, unspecified organism: Secondary | ICD-10-CM | POA: Diagnosis not present

## 2020-08-26 DIAGNOSIS — Z515 Encounter for palliative care: Secondary | ICD-10-CM | POA: Diagnosis not present

## 2020-08-26 DIAGNOSIS — Z7189 Other specified counseling: Secondary | ICD-10-CM | POA: Diagnosis not present

## 2020-08-26 DIAGNOSIS — J189 Pneumonia, unspecified organism: Secondary | ICD-10-CM | POA: Diagnosis not present

## 2020-08-26 LAB — GLUCOSE, CAPILLARY
Glucose-Capillary: 151 mg/dL — ABNORMAL HIGH (ref 70–99)
Glucose-Capillary: 196 mg/dL — ABNORMAL HIGH (ref 70–99)
Glucose-Capillary: 218 mg/dL — ABNORMAL HIGH (ref 70–99)
Glucose-Capillary: 264 mg/dL — ABNORMAL HIGH (ref 70–99)

## 2020-08-26 LAB — COMPREHENSIVE METABOLIC PANEL
ALT: 28 U/L (ref 0–44)
AST: 18 U/L (ref 15–41)
Albumin: 2 g/dL — ABNORMAL LOW (ref 3.5–5.0)
Alkaline Phosphatase: 61 U/L (ref 38–126)
Anion gap: 7 (ref 5–15)
BUN: 10 mg/dL (ref 8–23)
CO2: 29 mmol/L (ref 22–32)
Calcium: 8.2 mg/dL — ABNORMAL LOW (ref 8.9–10.3)
Chloride: 103 mmol/L (ref 98–111)
Creatinine, Ser: 0.78 mg/dL (ref 0.61–1.24)
GFR calc Af Amer: >60 mL/min (ref >60)
GFR calc non Af Amer: >60 mL/min (ref >60)
Glucose, Bld: 176 mg/dL — ABNORMAL HIGH (ref 70–99)
Potassium: 2.8 mmol/L — ABNORMAL LOW (ref 3.5–5.1)
Sodium: 139 mmol/L (ref 135–145)
Total Bilirubin: 0.7 mg/dL (ref 0.3–1.2)
Total Protein: 5.1 g/dL — ABNORMAL LOW (ref 6.5–8.1)

## 2020-08-26 LAB — MAGNESIUM: Magnesium: 2 mg/dL (ref 1.7–2.4)

## 2020-08-26 LAB — SARS CORONAVIRUS 2 BY RT PCR (HOSPITAL ORDER, PERFORMED IN ~~LOC~~ HOSPITAL LAB): SARS Coronavirus 2: POSITIVE — AB

## 2020-08-26 MED ORDER — POTASSIUM CHLORIDE 10 MEQ/100ML IV SOLN
10.0000 meq | INTRAVENOUS | Status: AC
  Administered 2020-08-26 (×3): 10 meq via INTRAVENOUS
  Filled 2020-08-26 (×4): qty 100

## 2020-08-26 NOTE — Plan of Care (Signed)

## 2020-08-26 NOTE — Progress Notes (Signed)
PROGRESS NOTE  Anthony [name] CXK:[id number] DOB: 1934-03-23 DOA: 08/19/2020 PCP: Josetta Huddle, MD  Brief History   86 year old skilled nursing resident white male Rheumatoid arthritis on chronic prednisone-previously on methotrexate HFrEF with CAD NSVT Crohn's disease Long-term memory issues and dementia Hospitalized 9/2 through 07/29/2020 for acute respiratory distress secondary to coronavirus 19-at that admission found to have steroid-induced hyperglycemia  Evaluated in the emergency room 9/27 secondary to less responsiveness than prior with worsening of his baseline dementia On admission found to have WBC 15.1 lactic acidosis 2.9 creatinine 1.3 (baseline 0.9). CXR: demonstrated pneumonia.  The patient had bradycardia on 08/23/2020. On 08/25/2020 the patient went into atrial fibrillation with hypotension and heart rate in the 130's. This was treated with IV fluid bolus and amiodarone bolus. As blood pressure has recovered, he has been restarted on Cored 3.125 bid. He converted to NSR.   Plan is for the patient to discharge to [facility] with Hospice.  Consultants  . Palliative Care . Hospice  Antibiotics   Anti-infectives (From admission, onward)   Start     Dose/Rate Route Frequency Ordered Stop   08/25/20 2200  amoxicillin-clavulanate (AUGMENTIN) 875-125 MG per tablet 1 tablet        1 tablet Oral 2 times daily 08/25/20 1800     08/19/20 2200  piperacillin-tazobactam (ZOSYN) IVPB 3.375 g  Status:  Discontinued        3.375 g 12.5 mL/hr over 240 Minutes Intravenous Every 8 hours 08/19/20 1832 08/25/20 1800   08/19/20 1615  cefTRIAXone (ROCEPHIN) 1 g in sodium chloride 0.9 % 100 mL IVPB        1 g 200 mL/hr over 30 Minutes Intravenous  Once 08/19/20 1601 08/19/20 1912   08/19/20 1615  azithromycin (ZITHROMAX) 500 mg in sodium chloride 0.9 % 250 mL IVPB        500 mg 250 mL/hr over 60 Minutes Intravenous  Once 08/19/20 1601 08/19/20 1821      Subjective  The  patient is resting in bed. No new complaints.  Objective   Vitals:  Vitals:   08/26/20 0447 08/26/20 0805  BP: (!) 164/81 111/63  Pulse: 79 69  Resp:  17  Temp: 97.8 F (36.6 C) (!) 97.5 F (36.4 C)  SpO2: 99% 97%    Exam:  Constitutional:  . The patient is awake and alert. No acute distress. Respiratory:  . No increased work of breathing. . No wheezes, rales, or rhonchi . No tactile fremitus Cardiovascular:  . Regular rate and rhythm, HR of 70. Marland Kitchen No murmurs, ectopy, or gallups. . No lateral PMI. No thrills. Abdomen:  . Abdomen is soft, non-tender, non-distended . No hernias, masses, or organomegaly . Normoactive bowel sounds.  Musculoskeletal:  . No cyanosis, clubbing, or edema Skin:  . No rashes, lesions, ulcers . palpation of skin: no induration or nodules Neurologic:  . CN 2-12 intact . Sensation all 4 extremities intact Psychiatric:  . Mental status o Mood, affect appropriate o Orientation to person, place, time  . judgment and insight appear intact  I have personally reviewed the following:   Today's Data  . Vitals, CMP  Scheduled Meds: . amoxicillin-clavulanate  1 tablet Oral BID  . carvedilol  3.125 mg Oral BID WC  . donepezil  5 mg Oral QHS  . feeding supplement (NEPRO CARB STEADY)  237 mL Oral TID BM  . heparin  5,000 Units Subcutaneous Q8H  . insulin aspart  0-15 Units Subcutaneous TID WC  . LORazepam  0.5 mg Oral QHS  . mesalamine  2.4 g Oral BID  . multivitamin with minerals  1 tablet Oral Daily  . nystatin  5 mL Oral QID  . pantoprazole  40 mg Oral Daily  . pravastatin  40 mg Oral QHS  . predniSONE  60 mg Oral Q breakfast   Continuous Infusions: . lactated ringers 75 mL/hr at 08/25/20 1619    Principal Problem:   Sepsis due to pneumonia [facility]) Active Problems:   Benign prostatic hyperplasia without urinary obstruction   Chronic combined systolic and diastolic heart failure (HCC)   Benign essential HTN   Thrush   Acute metabolic  encephalopathy   Failure to thrive in adult   Unspecified protein-calorie malnutrition ([place])   Palliative care by specialist   Goals of care, counseling/discussion   LOS: 7 days   A & P   Toxic metabolic encephalopathy secondary to infection: Mental status seems improved after treatment with antibiotics and minimization of polypharmacy. Monitor trends, continue Aricept at bedtime.  HCAP versus aspiration: Zosyn stopped after 7 days of therapy. Augmentin has been started and will continue for another 7 days. Periodic chest x-ray as needed. Speech therapy seen the patient and recommend dysphagia 1 nectar thick liquids and will need to continue this at skilled.  Atrial fibrillation with RVR: Confirmed with EKG. The patient was placed on telemetry. He was given a bolus of amiodarone and bolus of IV fluids. He has converted back to NSR. Blood pressure improved after IV fluids. The patient has been restarted on coreg at 12/ the dose he had been on at home. Blood pressure appears to be tolerating it well so far. Monitor overnight.  Persistent hyperglycemia with previous steroid-induced hyperglycemia. The patient has been tapered off of IV solumedrol to oral prednisone daily. Monitor sugars and adjust insulin. Long acting insulin has been discontinued due to episode of hypoglycemia. Monitor.  AKI: Significantly improved from admission continuing saline with at lower rate of 50 cc/h. Check labs again in a.m. reviewed.  Prior rheumatoid arthritis Crohn's disease-previous methotrexate/prednisone:  Patient has been resumed on Lialda 2.4 twice daily. Not currently on methotrexate.  HFrEF and HTN in the setting of prior CAD: Continue secondary prevention Pravachol 40, Nitrostat. Family insistent on patient being on telemetry which has been reordered.  BPH: Continue Flomax 0.4 at bedtime.  Polypharmacy: Multiple prior to admission meds have been held which contribute to polypharmacy including primidone 50  at bedtime melatonin 6 at bedtime.  I have seen and examined this patient myself. I have spent 38 minutes in his evaluation and care.  DVT prophylaxis: Lovenox Code Status: Full Family Communication: Discussed with daughter at the bedside Disposition: To SNF with Hospice when bed is available, and patient is appropriate for discharge from a medical standpoint.  Status is: Inpatient  Remains inpatient appropriate because:Need to taper steroids and adjust insulin coverage.  Dispo: The patient is from: Home  Anticipated d/c is to: SNF  Anticipated d/c date is: 1 days  Patient currently is not medically stable to d/c.  Malley Hauter, DO [facility] Direct contact: see [email or web address]  7PM-7AM contact night coverage as above 08/26/2020, 3:00 PM  LOS: 5 days

## 2020-08-26 NOTE — Progress Notes (Signed)
[facility] ([facility])  Pt has been referred for hospice services after discharge and has been approved as eligible by [facility] MD.    [place] Parents send signed and completed DNR home with pt/family.  Please provide prescriptions at discharge as needed to ensure ongoing symptom management until pt can be admitted onto hospice services.  [facility] information and contact numbers given to [facility].   Please call with any questions or concerns.  Thank you for the opportunity to participate in this pt's care.  Domenic Moras, BSN, RN [employer] [phone] (24h on call)

## 2020-08-26 NOTE — TOC Progression Note (Signed)
Transition of Care [facility]) - Progression Note    Patient Details  Name: MILES LEYDA MRN: [id number] Date of Birth: 1934/05/01  Transition of Care [facility]) CM/SW [place], [place] Phone Number: 08/26/2020, 12:08 PM  Clinical Narrative:     CSW requested covid test from MD. MD stated they wanted to keep pt one more night for observation. CSW contacted [facility] to inform them of pts pending discharge.   Expected Discharge Plan: [place] Barriers to Discharge: Continued Medical Work up  Expected Discharge Plan and Services Expected Discharge Plan: [place] Choice: [place] arrangements for the past 2 months: [place] Determinants of Health (SDOH) Interventions    Readmission Risk Interventions Readmission Risk Prevention Plan 03/25/2020 03/06/2019  Transportation Screening Complete Complete  PCP or Specialist Appt within 3-5 Days Complete -  HRI or [place] Complete -  Social Work Consult for [place] Planning/Counseling Complete -  [place] Screening Not Applicable -  Medication Review [employer]) Complete Complete  PCP or Specialist appointment within 3-5 days of discharge - Complete  HRI or [place] - Complete  SW Recovery Care/Counseling Consult - Complete  [place] - Complete  Some recent data might be hidden   Emeterio Reeve, Latanya Presser, [place] Social Worker [phone]

## 2020-08-27 DIAGNOSIS — J189 Pneumonia, unspecified organism: Secondary | ICD-10-CM | POA: Diagnosis not present

## 2020-08-27 DIAGNOSIS — A419 Sepsis, unspecified organism: Secondary | ICD-10-CM | POA: Diagnosis not present

## 2020-08-27 LAB — BASIC METABOLIC PANEL
Anion gap: 10 (ref 5–15)
BUN: 12 mg/dL (ref 8–23)
CO2: 23 mmol/L (ref 22–32)
Calcium: 8.1 mg/dL — ABNORMAL LOW (ref 8.9–10.3)
Chloride: 106 mmol/L (ref 98–111)
Creatinine, Ser: 0.7 mg/dL (ref 0.61–1.24)
GFR calc Af Amer: >60 mL/min (ref >60)
GFR calc non Af Amer: >60 mL/min (ref >60)
Glucose, Bld: 252 mg/dL — ABNORMAL HIGH (ref 70–99)
Potassium: 3.2 mmol/L — ABNORMAL LOW (ref 3.5–5.1)
Sodium: 139 mmol/L (ref 135–145)

## 2020-08-27 LAB — GLUCOSE, CAPILLARY
Glucose-Capillary: 185 mg/dL — ABNORMAL HIGH (ref 70–99)
Glucose-Capillary: 212 mg/dL — ABNORMAL HIGH (ref 70–99)

## 2020-08-27 MED ORDER — PREDNISONE 20 MG PO TABS: ORAL_TABLET | ORAL | 0 refills | Status: AC

## 2020-08-27 MED ORDER — CARVEDILOL 3.125 MG PO TABS: 3.1250 mg | ORAL_TABLET | Freq: Two times a day (BID) | ORAL | 0 refills | Status: AC

## 2020-08-27 MED ORDER — ADULT MULTIVITAMIN W/MINERALS CH: 1.0000 | ORAL_TABLET | Freq: Every day | ORAL | 0 refills | Status: AC

## 2020-08-27 MED ORDER — AMOXICILLIN-POT CLAVULANATE 875-125 MG PO TABS: 1.0000 | ORAL_TABLET | Freq: Two times a day (BID) | ORAL | 0 refills | Status: AC

## 2020-08-27 MED ORDER — RESOURCE THICKENUP CLEAR PO POWD: 1.0000 g | ORAL | 0 refills | Status: AC | PRN

## 2020-08-27 MED ORDER — LANTUS SOLOSTAR 100 UNIT/ML ~~LOC~~ SOPN: 4.0000 [IU] | PEN_INJECTOR | Freq: Every day | SUBCUTANEOUS | 11 refills | Status: DC

## 2020-08-27 MED ORDER — NYSTATIN 100000 UNIT/ML MT SUSP: 5.0000 mL | Freq: Four times a day (QID) | OROMUCOSAL | 0 refills | Status: AC

## 2020-08-27 NOTE — TOC Transition Note (Signed)
Transition of Care [facility]) - CM/SW Discharge Note   Patient Details  Name: Anthony [name] MRN: [id number] Date of Birth: September 27, 1934  Transition of Care [facility]) CM/SW Contact:  Gabrielle Dare Phone Number: 08/27/2020, 10:37 AM   Clinical Narrative:    Patient will Discharge To: [facility] Anticipated DC Date:08/27/20 Family Notified:yes, daughter [name] [phone] Transport By: Corey Harold   Per MD patient ready for DC to [facility] . RN, patient, patient's family, and facility notified of DC. Assessment, Fl2/Pasrr, and Discharge Summary sent to facility. RN given number for report [phone], Room # 15). DC packet on chart. Ambulance transport requested for patient for 12:00pm  CSW signing off.  Reed Breech LCSWA [phone]     Final next level of care: Skilled Nursing Facility Barriers to Discharge: No Barriers Identified   Patient Goals and CMS Choice        Discharge Placement              Patient [facility] bed at: [facility] Patient to be transferred to facility by: [place] Name of family member notified: [name], Daughter Patient and family notified of of transfer: 08/27/20  Discharge Plan and Services     Post Acute Care Choice: [place]                               Social Determinants of Health (SDOH) Interventions     Readmission Risk Interventions Readmission Risk Prevention Plan 03/25/2020 03/06/2019  Transportation Screening Complete Complete  PCP or Specialist Appt within 3-5 Days Complete -  HRI or Home Care Consult Complete -  Social Work Consult for [place] Planning/Counseling Complete -  Palliative Care Screening Not Applicable -  Medication Review Press photographer) Complete Complete  PCP or Specialist appointment within 3-5 days of discharge - Complete  HRI or [place] - Complete  SW Recovery Care/Counseling Consult - Complete  [place] - Complete  Some recent data might be hidden

## 2020-08-27 NOTE — Care Management Important Message (Signed)
Important Message  Patient Details  Name: Anthony [name] MRN: [id number] Date of Birth: [birth year]   Medicare Important Message Given:  Yes - Important Message mailed due to current National Emergency  Verbal consent obtained due to current National Emergency  Relationship to patient: Self Contact Name: Thurman Coyer Call Date: 08/27/20  Time: 1143 Phone: [phone] Outcome: Spoke with contact Important Message mailed to: Patient address on file    Delorse Lek 08/27/2020, 11:44 AM

## 2020-08-27 NOTE — Progress Notes (Signed)
Physical Therapy Treatment Patient Details Name: Anthony [name] MRN: [id number] DOB: [birth year] Today's Date: 08/27/2020    History of Present Illness Anthony [name] is a 86 y.o. male with medical history significant for rheumatoid arthritis, systolic heart failure, CAD, Crohn's disease, dementia, GERD, hyperlipidemia, as well as recent Covid infection more than 21 days ago here at [facility] with moderate improvement but general decline at SNF over the past few weeks. Pt returns to [facility] with sepsis due to PNA.     PT Comments    Pt supine in bed and required encouragement from his daughter and PTA to participate in PT session.  Pt continues to benefit from skilled rehab in a post acute setting to improve activity tolerance and strength.  Plan for d/c to snf this pm.     Follow Up Recommendations  SNF;Supervision/Assistance - 24 hour     Equipment Recommendations  None recommended by PT    Recommendations for Other Services       Precautions / Restrictions Precautions Precautions: Fall Precaution Comments: pt's daughter reports that pt fell once at [facility] and once at [facility], since he had covid Restrictions Weight Bearing Restrictions: No    Mobility  Bed Mobility Overal bed mobility: Needs Assistance Bed Mobility: Rolling;Sidelying to Sit;Sit to Sidelying Rolling: Max assist Sidelying to sit: Total assist     Sit to sidelying: Max assist;Total assist General bed mobility comments: Pt performed rolling to R and L side with cues for hand placement with multimodal cues.  He required max-total assistance to rise into sitting, maintain sitting and return back to bed.  Transfers Overall transfer level:  (NT not ready for transfers at this time.)                  Ambulation/Gait                 Stairs             Wheelchair Mobility    Modified Rankin (Stroke Patients Only)       Balance Overall balance assessment: Needs assistance;History  of Falls Sitting-balance support: Bilateral upper extremity supported;Feet supported Sitting balance-Leahy Scale: Poor Sitting balance - Comments: Pt required moderate assistance- max assistance to maintain balance.  He presents with posterior lean so focused on forward weight shifting and multipe reps.  He does have a slow righting response in sitting. Postural control: Posterior lean                                  Cognition Arousal/Alertness: Awake/alert Behavior During Therapy: WFL for tasks assessed/performed Overall Cognitive Status: History of cognitive impairments - at baseline                                 General Comments: baseline history of alzheimers. Is pleasant and conversive in session. Requires increased time and cueing to problem solve basic mobility tasks.  He did have a moment where he became tearful but his wife passed away this weekend.      Exercises Other Exercises Other Exercises: Cervical flexion/extension in sitting.  Limited ROM x 10 reps.    General Comments        Pertinent Vitals/Pain Pain Assessment: Faces Faces Pain Scale: Hurts even more Pain Location: perineal area and buttocks Pain Descriptors / Indicators: Sore Pain Intervention(s): Monitored during session;Repositioned  Home Living                      Prior Function            PT Goals (current goals can now be found in the care plan section) Acute Rehab PT Goals Patient Stated Goal: none stated. Daughter would like pt to get therapy when he returns to SNF Potential to Achieve Goals: Fair Progress towards PT goals: Progressing toward goals    Frequency    Min 2X/week      PT Plan Current plan remains appropriate    Co-evaluation              AM-PAC PT "6 Clicks" Mobility   Outcome Measure  Help needed turning from your back to your side while in a flat bed without using bedrails?: Total Help needed moving from lying on  your back to sitting on the side of a flat bed without using bedrails?: Total Help needed moving to and from a bed to a chair (including a wheelchair)?: Total Help needed standing up from a chair using your arms (e.g., wheelchair or bedside chair)?: Total Help needed to walk in hospital room?: Total Help needed climbing 3-5 steps with a railing? : Total 6 Click Score: 6    End of Session Equipment Utilized During Treatment: Oxygen Activity Tolerance: Patient limited by fatigue Patient left: in bed;with call bell/phone within reach;with bed alarm set;with family/visitor present Nurse Communication: Mobility status (informed nursing of red irritated skin) PT Visit Diagnosis: History of falling (Z91.81);Muscle weakness (generalized) (M62.81)     Time: 1030-1104 PT Time Calculation (min) (ACUTE ONLY): 34 min  Charges:  $Therapeutic Activity: 23-37 mins                     Anthony [name] , PTA Acute Rehabilitation Services Pager [phone] Office [phone]     Anthony [name] Anthony [name] 08/27/2020, 11:12 AM

## 2020-08-27 NOTE — Discharge Summary (Signed)
Physician Discharge Summary  Anthony [name] VQQ:[id number] DOB: 1934-03-08 DOA: 08/19/2020  PCP: Josetta Huddle, MD  Admit date: 08/19/2020 Discharge date: 08/27/2020  Recommendations for Outpatient Follow-up:  1. Discharge to SNF with [place] information for after-discharge care    Destination    [facility] .   Service: Skilled Nursing Contact information: [address] [place] [phone]                   Discharge Diagnoses: Principal diagnosis is #1 1. TME, dementia 2. HCAP/Aspiration pneumonia 3. Hyperglycemia due to steroids 4. AKI 5. Polypharmacy 6. Hx Ra 7. Crohn's 8. HFrEF 9. Hypertension 10. CAD 43. BPH 12. AF with RVR  Discharge Condition: Fair  Disposition: SNF with Hospice  Diet recommendation: Modified carbohydrates, heart healthy  Filed Weights   08/19/20 1421  Weight: 81 kg    History of present illness:  Anthony [name] is a 86 y.o. male with medical history significant for rheumatoid arthritis, systolic heart failure, CAD, Crohn's disease, dementia, GERD, hyperlipidemia, as well as recent Covid infection more than 21 days ago here at [facility] with moderate improvement but general decline at SNF over the past few weeks.  Patient was evaluated at SNF and found to be less responsive than previous baseline although given his worsening status and baseline dementia it is unclear whether or not he had any acute changes rather than general decline.  Daughter was contacted for further information and indicates no acute changes to the best of her knowledge but simply a further decline in patient's status and poor p.o. intake over the past few days.  ED Course: In the ED patient was evaluated noted to be afebrile, satting 99 to 100% on 2 L nasal cannula which was his discharge oxygen level from previous hospitalization.  His labs remarkable for mild  leukocytosis of 15.1 with elevated lactic acidosis at 2.9 and elevated creatinine at 1.3 with baseline creatinine previously around 0.9.  Chest x-ray was concerning for pneumonia and hospitalist was called to admit for community-acquired pneumonia in the setting of sepsis and mental status changes.  Hospital Course:  86 year old skilled nursing resident white male Rheumatoid arthritis on chronic prednisone-previously on methotrexate HFrEF with CAD NSVT Crohn's disease Long-term memory issues and dementia Hospitalized 9/2 through 07/29/2020 for acute respiratory distress secondary to coronavirus 19-at that admission found to have steroid-induced hyperglycemia  Evaluated in the emergency room 9/27 secondary to less responsiveness than prior with worsening of his baseline dementia On admission found to have WBC 15.1 lactic acidosis 2.9 creatinine 1.3 (baseline 0.9). CXR: demonstrated pneumonia.  The patient had bradycardia on 08/23/2020. On 08/25/2020 the patient went into atrial fibrillation with hypotension and heart rate in the 130's. This was treated with IV fluid bolus and amiodarone bolus. As blood pressure has recovered, he has been restarted on Cored 3.125 bid. He converted to NSR.   Plan is for the patient to [facility] with H[facility].  Today's assessment: S: The patient is resting comfortably. No new complaints. O: Vitals:  Vitals:   08/27/20 0500 08/27/20 0738  BP: 109/66 122/71  Pulse: 72 (!) 50  Resp: 18 17  Temp: 97.7 F (36.5 C) (!) 97.5 F (36.4 C)  SpO2: 94% 98%   Exam:  Constitutional:   The patient is awake and alert. No acute distress. Respiratory:   No increased work of breathing.  No wheezes, rales,  or rhonchi  No tactile fremitus Cardiovascular:   Regular rate and rhythm, HR of 70.  No murmurs, ectopy, or gallups.  No lateral PMI. No thrills. Abdomen:   Abdomen is soft, non-tender, non-distended  No hernias, masses, or  organomegaly  Normoactive bowel sounds.  Musculoskeletal:   No cyanosis, clubbing, or edema Skin:   No rashes, lesions, ulcers  palpation of skin: no induration or nodules Neurologic:   CN 2-12 intact  Sensation all 4 extremities intact Psychiatric:   Mental status ? Mood, affect appropriate ? Orientation to person, place, time   judgment and insight appear intact  Discharge Instructions  Discharge Instructions    Activity as tolerated - No restrictions   Complete by: As directed    Call MD for:  persistant nausea and vomiting   Complete by: As directed    Call MD for:  severe uncontrolled pain   Complete by: As directed    Diet - low sodium heart healthy   Complete by: As directed    Discharge instructions   Complete by: As directed    Discharge to SNF [facility]) with [facility]. End of life care.   Increase activity slowly   Complete by: As directed      Allergies as of 08/27/2020   No Known Allergies     Medication List    STOP taking these medications   B-12 500 MCG Tabs   furosemide 40 MG tablet Commonly known as: LASIX   guaiFENesin 600 MG 12 hr tablet Commonly known as: MUCINEX   HYDROcodone-acetaminophen 5-325 MG tablet Commonly known as: NORCO/VICODIN   Magnesium 200 MG Tabs   melatonin 3 MG Tabs tablet   oxybutynin 5 MG tablet Commonly known as: DITROPAN   pravastatin 40 MG tablet Commonly known as: PRAVACHOL   PreviDent 5000 Plus 1.1 % Crea dental cream Generic drug: sodium fluoride   primidone 50 MG tablet Commonly known as: MYSOLINE   tamsulosin 0.4 MG Caps capsule Commonly known as: FLOMAX   vitamin A 10000 UNIT capsule   Vitamin D3 50 MCG (2000 UT) capsule   vitamin E 180 MG (400 UNITS) capsule     TAKE these medications   acetaminophen 325 MG tablet Commonly known as: TYLENOL Take 650 mg by mouth See admin instructions. Take 2 tablets (650 mg) by mouth three times daily for pain management, may  also take 2 tablets (650 mg) every 6 hours as needed for mild pain/fever   amoxicillin-clavulanate 875-125 MG tablet Commonly known as: AUGMENTIN Take 1 tablet by mouth 2 (two) times daily for 4 days.   ascorbic acid 500 MG tablet Commonly known as: VITAMIN C Take 1,000 mg by mouth daily. COVID prophylaxis   b complex vitamins tablet Take 1 tablet by mouth daily.   BIOTENE MOISTURIZING MOUTH MT Use as directed 2 sprays in the mouth or throat every 6 (six) hours as needed (dry mouth).   carvedilol 3.125 MG tablet Commonly known as: COREG Take 1 tablet (3.125 mg total) by mouth 2 (two) times daily with a meal. What changed:   medication strength  how much to take  how to take this  when to take this  additional instructions   docusate sodium 100 MG capsule Commonly known as: COLACE Take 1 capsule (100 mg total) by mouth 2 (two) times daily.   donepezil 5 MG tablet Commonly known as: ARICEPT Take 5 mg by mouth at bedtime.   feeding supplement (ENSURE ENLIVE) Liqd Take 237 mLs  by mouth 2 (two) times daily between meals.   guaiFENesin-dextromethorphan 100-10 MG/5ML syrup Commonly known as: ROBITUSSIN DM Take 5 mLs by mouth every 4 (four) hours as needed for cough.   ipratropium-albuterol 0.5-2.5 (3) MG/3ML Soln Commonly known as: DUONEB Take 3 mLs by nebulization every 6 (six) hours as needed (shortness of breath/wheezing).   Lantus SoloStar 100 UNIT/ML Solostar Pen Generic drug: insulin glargine Inject 4 Units into the skin daily.   LORazepam 0.5 MG tablet Commonly known as: ATIVAN Take 0.5 mg by mouth in the morning and at bedtime. Give at 4pm and at bedtime   mesalamine 1.2 g EC tablet Commonly known as: LIALDA Take 2.4 g by mouth 2 (two) times daily.   multivitamin with minerals Tabs tablet Take 1 tablet by mouth daily. Start taking on: August 28, 2020   nitroGLYCERIN 0.4 MG SL tablet Commonly known as: NITROSTAT Place 0.4 mg under the tongue every 5  (five) minutes as needed for chest pain.   nystatin 100000 UNIT/ML suspension Commonly known as: MYCOSTATIN Take 5 mLs (500,000 Units total) by mouth 4 (four) times daily.   omeprazole 40 MG capsule Commonly known as: PRILOSEC Take 40 mg by mouth at bedtime.   predniSONE 20 MG tablet Commonly known as: DELTASONE Take 2.5 tablets (50 mg total) by mouth daily with breakfast for 3 days, THEN 2 tablets (40 mg total) daily with breakfast for 3 days, THEN 1.5 tablets (30 mg total) daily with breakfast for 3 days, THEN 1 tablet (20 mg total) daily with breakfast for 3 days, THEN 0.5 tablets (10 mg total) daily with breakfast for 3 days. Start taking on: August 28, 2020 What changed:   medication strength  See the new instructions.   Resource ThickenUp Clear Powd Take 1 g by mouth as needed.      No Known Allergies  The results of significant diagnostics from this hospitalization (including imaging, microbiology, ancillary and laboratory) are listed below for reference.    Significant Diagnostic Studies: DG Chest Port 1 View  Result Date: 08/19/2020 CLINICAL DATA:  Weakness EXAM: PORTABLE CHEST 1 VIEW COMPARISON:  07/25/2020 FINDINGS: Cardiac silhouette is within normal limits and is accentuated by low lung volumes and portable technique. Aortic atherosclerosis. Low lung volumes with bibasilar opacities. No visible pleural effusions. No pneumothorax. No acute osseous abnormality. IMPRESSION: Bibasilar opacities, which may represent atelectasis, aspiration and/or pneumonia. Electronically Signed   By: Margaretha Sheffield MD   On: 08/19/2020 15:01   DG Swallowing Func-Speech Pathology  Result Date: 08/20/2020 Completed and documented by Greggory Keen, SLP Student Supervised and reviewed by Herbie Baltimore MA CCC-SLP Objective Swallowing Evaluation: Type of Study: MBS-Modified Barium Swallow Study  Patient Details Name: MAJESTY OEHLERT MRN: [id number] Date of Birth: 11/27/1933 Today's Date:  08/20/2020 Time: SLP Start Time (ACUTE ONLY): 1007 -SLP Stop Time (ACUTE ONLY): 1033 SLP Time Calculation (min) (ACUTE ONLY): 26 min Past Medical History: Past Medical History: Diagnosis Date . Arthritis   rheumatoid-  Dr Barkley Boards . CHF (congestive heart failure) (HCC)   chronic systolic heart failure per Dr [name] LOV note 09/23/11.   EKG 4/12, stress test  and cath from 8/12 on chart.  Clearance with LOV Dr [name] on chart . Coronary artery disease  . Crohn disease ([place])  . Dementia ([place])  . Dysrhythmia   nonsustained,asymptomatic VT per Dr [name] office note  resulting in cath . GERD (gastroesophageal reflux disease)  . Heart attack ([place]) 1996, 06/2011 . Hyperlipidemia  . Hypertension  .  Pneumonia 1993 . Prostate troubles   states take alternating meds for bladder/prostate control- "age thing" . Sleep apnea   sleep study years ago- has apnea but did not qualify for CPAP Past Surgical History: Past Surgical History: Procedure Laterality Date . CARDIAC CATHETERIZATION    1996/ 2012 report on chart . COLONOSCOPY   . HIP ARTHROPLASTY Left 03/04/2019  Procedure: LEFT HIP HEMIARTHROPLASTY;  Surgeon: Mcarthur Rossetti, MD;  Location: [place];  Service: Orthopedics;  Laterality: Left; . INGUINAL HERNIA REPAIR  11/04/2011  Procedure: HERNIA REPAIR INGUINAL ADULT;  Surgeon: Pedro Earls, MD;  Location: [facility];  Service: General;  Laterality: Right;  Right Inguinal Hernia Repair with Mesh . KNEE ARTHROSCOPY Right 1976 . MENISCUS DEBRIDEMENT Left 1996 . TRIGGER FINGER RELEASE Right 10/10/2009 . UMBILICAL HERNIA REPAIR  2009 . WRIST SURGERY Left 04/25/91 HPI: MAHARI STRAHM is a 86 y.o. male with history of dementia, CAD, CHF, diabetes mellitus, Crohn's disease, rheumatoid arthritis was brought to the ER 9/2 with Covid infection. Presented with acute respiratory disease secondary to Covid infection with CT scan showing bilateral infiltrates. Readmitted 9/27 from SNF due to decreased responsiveness and poor p.o. intake. Pt  currently presents with sepsis secondary to PNA. MD reports likely post viral bacterial pneumonia, cannot rule out aspiration pneumonia versus pneumonitis. MBS completed 09/2018 revealed mild oropharyngeal dysphagia with pt exhibiting increased lethargy, respiratory impairments, and continuous and spontaneous cough during instrumental.  No data recorded Assessment / Plan / Recommendation CHL IP CLINICAL IMPRESSIONS 08/20/2020 Clinical Impression MBS revealed moderate oropharyngeal with pt exhibiting delayed oral transit, premature spillage, vallecular residue, and several instances of silent aspiration across POs. Pt exhibited increased lethargy throughout the study and required cueing to continue participation. Pt demonstrated reduced laryngeal/airway closure resulting in silent aspiration with thin liquid with both cup and straw sips. Given cueing to clear his throat, he produced a weak and ineffective cough. SLP attempted chin tuck with thin liquid, but pt was unable to execute maneuver and silent aspiration occurred. No aspiration was observed with cup sips of nectar, however, silent aspiration of nectar was observed via straw due to increased volume and flow combined with inadequate airway protection. No observation of aspiration occurred with puree, but mild vallecular residue was observed. Pt demonstrated delayed oral transit and increased difficulty initiating a swallow wih a solid and the swallow was not captured by MBS. Recommend dys 1 (puree) diet and nectar thin liquids via cup (no straw). Pt should utilize small bites/sips. SLP Visit Diagnosis Dysphagia, oropharyngeal phase (R13.12) Attention and concentration deficit following -- Frontal lobe and executive function deficit following -- Impact on safety and function Severe aspiration risk   CHL IP TREATMENT RECOMMENDATION 08/20/2020 Treatment Recommendations Therapy as outlined in treatment plan below   Prognosis 08/20/2020 Prognosis for Safe Diet  Advancement [place] Barriers to Reach Goals -- Barriers/Prognosis Comment (No Data) CHL IP DIET RECOMMENDATION 08/20/2020 SLP Diet Recommendations Dysphagia 1 (Puree) solids;Nectar thick liquid Liquid Administration via No straw;Cup Medication Administration Crushed with puree Compensations Small sips/bites Postural Changes Seated upright at 90 degrees   CHL IP OTHER RECOMMENDATIONS 08/20/2020 Recommended Consults -- Oral Care Recommendations Oral care BID Other Recommendations --   CHL IP FOLLOW UP RECOMMENDATIONS 08/20/2020 Follow up Recommendations Skilled Nursing facility   [facility] IP FREQUENCY AND DURATION 08/20/2020 Speech Therapy Frequency (ACUTE ONLY) min 2x/week Treatment Duration 2 weeks      CHL IP ORAL PHASE 08/20/2020 Oral Phase Impaired Oral - Pudding Teaspoon -- Oral - Pudding  Cup -- Oral - Honey Teaspoon -- Oral - Honey Cup -- Oral - Nectar Teaspoon -- Oral - Nectar Cup Premature spillage Oral - Nectar Straw Premature spillage Oral - Thin Teaspoon -- Oral - Thin Cup Premature spillage Oral - Thin Straw Premature spillage Oral - Puree Premature spillage Oral - Mech Soft -- Oral - Regular Delayed oral transit Oral - Multi-Consistency -- Oral - Pill -- Oral Phase - Comment --  CHL IP PHARYNGEAL PHASE 08/20/2020 Pharyngeal Phase Impaired Pharyngeal- Pudding Teaspoon -- Pharyngeal -- Pharyngeal- Pudding Cup -- Pharyngeal -- Pharyngeal- Honey Teaspoon -- Pharyngeal -- Pharyngeal- Honey Cup -- Pharyngeal -- Pharyngeal- Nectar Teaspoon -- Pharyngeal -- Pharyngeal- Nectar Cup -- Pharyngeal -- Pharyngeal- Nectar Straw Reduced airway/laryngeal closure Pharyngeal -- Pharyngeal- Thin Teaspoon -- Pharyngeal -- Pharyngeal- Thin Cup Penetration/Aspiration before swallow;Penetration/Apiration after swallow Pharyngeal Material enters airway, passes BELOW cords without attempt by patient to eject out (silent aspiration) Pharyngeal- Thin Straw Penetration/Aspiration before swallow;Penetration/Aspiration during swallow Pharyngeal  Material enters airway, passes BELOW cords without attempt by patient to eject out (silent aspiration) Pharyngeal- Puree Pharyngeal residue - valleculae Pharyngeal -- Pharyngeal- Mechanical Soft -- Pharyngeal -- Pharyngeal- Regular Pharyngeal residue - valleculae;Delayed swallow initiation-vallecula Pharyngeal -- Pharyngeal- Multi-consistency -- Pharyngeal -- Pharyngeal- Pill -- Pharyngeal -- Pharyngeal Comment --  CHL IP CERVICAL ESOPHAGEAL PHASE 10/07/2018 Cervical Esophageal Phase WFL Pudding Teaspoon -- Pudding Cup -- Honey Teaspoon -- Honey Cup -- Nectar Teaspoon -- Nectar Cup -- Nectar Straw -- Thin Teaspoon -- Thin Cup -- Thin Straw -- Puree -- Mechanical Soft -- Regular -- Multi-consistency -- Pill -- Cervical Esophageal Comment -- DeBlois, Katherene Ponto 08/20/2020, 1:00 PM               Microbiology: Recent Results (from the past 240 hour(s))  Culture, blood (x 2)     Status: None   Collection Time: 08/19/20  3:49 PM   Specimen: BLOOD LEFT HAND  Result Value Ref Range Status   Specimen Description BLOOD LEFT HAND  Final   Special Requests   Final    BOTTLES DRAWN AEROBIC AND ANAEROBIC Blood Culture results may not be optimal due to an inadequate volume of blood received in culture bottles   Culture   Final    NO GROWTH 5 DAYS Performed at [facility], [place] [address], [place], [place] [address]    Report Status 08/24/2020 FINAL  Final  Culture, blood (x 2)     Status: None   Collection Time: 08/19/20 10:41 PM   Specimen: BLOOD LEFT ARM  Result Value Ref Range Status   Specimen Description BLOOD LEFT ARM  Final   Special Requests   Final    BOTTLES DRAWN AEROBIC ONLY Blood Culture adequate volume   Culture   Final    NO GROWTH 5 DAYS Performed at [facility], [place] [address], [place], [place] [address]    Report Status 08/24/2020 FINAL  Final  SARS Coronavirus 2 by RT PCR (hospital order, performed in [facility] hospital lab) Nasopharyngeal Nasopharyngeal Swab      Status: Abnormal   Collection Time: 08/26/20 12:20 PM   Specimen: Nasopharyngeal Swab  Result Value Ref Range Status   SARS Coronavirus 2 POSITIVE (A) NEGATIVE Final    Comment: RESULT CALLED TO, READ BACK BY AND VERIFIED WITH: K,DUFFY @1335  08/26/20 EB (NOTE) SARS-CoV-2 target nucleic acids are DETECTED  SARS-CoV-2 RNA is generally detectable in upper respiratory specimens  during the acute phase of infection.  Positive results  are indicative  of the presence of the identified virus, but do not rule out bacterial infection or co-infection with other pathogens not detected by the test.  Clinical correlation with patient history and  other diagnostic information is necessary to determine patient infection status.  The expected result is negative.  Fact Sheet for Patients:   StrictlyIdeas.no   Fact Sheet for Healthcare Providers:   BankingDealers.co.za    This test is not yet approved or cleared by the [place] FDA and  has been authorized for detection and/or diagnosis of SARS-CoV-2 by FDA under an Emergency Use Authorization (EUA).  This EUA will remain in effect (meaning this test can be  used) for the duration of  the COVID-19 declaration under Section 564(b)(1) of the Act, 21 U.S.C. section 360-bbb-3(b)(1), unless the authorization is terminated or revoked sooner.  Performed at [facility], [place] [address] [place], [place] [address]      Labs: Basic Metabolic Panel: Recent Labs  Lab 08/21/20 1025 08/22/20 1235 08/23/20 0511 08/24/20 0331 08/25/20 0052 08/26/20 0416 08/27/20 0329  NA 142   < > 138 140 137 139 139  K 3.2*   < > 3.5 3.5 3.4* 2.8* 3.2*  CL 101   < > 102 104 103 103 106  CO2 24   < > 25 26 24 29 23   GLUCOSE 407*   < > 126* 147* 143* 176* 252*  BUN 39*   < > 22 19 17 10 12   CREATININE 1.41*   < > 0.86 0.90 0.78 0.78 0.70  CALCIUM 9.2   < > 8.4* 8.4* 8.2* 8.2* 8.1*  MG 2.4  --   --   --   --   2.0  --    < > = values in this interval not displayed.   Liver Function Tests: Recent Labs  Lab 08/24/20 0331 08/25/20 0052 08/26/20 0416  AST 23 19 18   ALT 34 31 28  ALKPHOS 62 65 61  BILITOT 0.6 0.7 0.7  PROT 5.2* 4.8* 5.1*  ALBUMIN 2.0* 1.9* 2.0*   No results for input(s): LIPASE, AMYLASE in the last 168 hours. No results for input(s): AMMONIA in the last 168 hours. CBC: Recent Labs  Lab 08/21/20 1025 08/22/20 1235 08/23/20 0511 08/24/20 0331  WBC 21.2* 15.4* 14.6* 12.4*  HGB 15.5 14.3 12.6* 13.2  HCT 48.1 44.9 39.1 41.4  MCV 92.5 93.7 91.4 91.8  PLT 252 193 181 173   Cardiac Enzymes: No results for input(s): CKTOTAL, CKMB, CKMBINDEX, TROPONINI in the last 168 hours. BNP: BNP (last 3 results) No results for input(s): BNP in the last 8760 hours.  ProBNP (last 3 results) No results for input(s): PROBNP in the last 8760 hours.  CBG: Recent Labs  Lab 08/26/20 0651 08/26/20 1125 08/26/20 1615 08/26/20 2010 08/27/20 0627  GLUCAP 151* 196* 218* 264* 185*    Principal Problem:   Sepsis due to pneumonia [facility]) Active Problems:   Benign prostatic hyperplasia without urinary obstruction   Chronic combined systolic and diastolic heart failure (HCC)   Benign essential HTN   Thrush   Acute metabolic encephalopathy   Failure to thrive in adult   Unspecified protein-calorie malnutrition ([place])   Palliative care by specialist   Goals of care, counseling/discussion   Time coordinating discharge: 38 minutes.  Signed:        Jerlisa Diliberto, DO [facility]  08/27/2020, 10:27 AM

## 2020-08-28 DIAGNOSIS — R531 Weakness: Secondary | ICD-10-CM | POA: Diagnosis not present

## 2020-08-28 DIAGNOSIS — R0602 Shortness of breath: Secondary | ICD-10-CM | POA: Diagnosis not present

## 2020-08-28 DIAGNOSIS — J189 Pneumonia, unspecified organism: Secondary | ICD-10-CM | POA: Diagnosis not present

## 2020-08-28 DIAGNOSIS — R630 Anorexia: Secondary | ICD-10-CM | POA: Diagnosis not present

## 2020-08-28 DIAGNOSIS — R627 Adult failure to thrive: Secondary | ICD-10-CM | POA: Diagnosis not present

## 2020-08-29 DIAGNOSIS — I110 Hypertensive heart disease with heart failure: Secondary | ICD-10-CM | POA: Diagnosis not present

## 2020-09-02 DIAGNOSIS — D72829 Elevated white blood cell count, unspecified: Secondary | ICD-10-CM | POA: Diagnosis not present

## 2020-09-02 DIAGNOSIS — R531 Weakness: Secondary | ICD-10-CM | POA: Diagnosis not present

## 2020-09-02 DIAGNOSIS — R627 Adult failure to thrive: Secondary | ICD-10-CM | POA: Diagnosis not present

## 2020-09-02 DIAGNOSIS — R630 Anorexia: Secondary | ICD-10-CM | POA: Diagnosis not present

## 2020-09-02 DIAGNOSIS — J189 Pneumonia, unspecified organism: Secondary | ICD-10-CM | POA: Diagnosis not present

## 2020-09-03 DIAGNOSIS — M6281 Muscle weakness (generalized): Secondary | ICD-10-CM | POA: Diagnosis not present

## 2020-09-03 DIAGNOSIS — R627 Adult failure to thrive: Secondary | ICD-10-CM | POA: Diagnosis not present

## 2020-09-05 DIAGNOSIS — I4891 Unspecified atrial fibrillation: Secondary | ICD-10-CM | POA: Diagnosis not present

## 2020-09-05 DIAGNOSIS — J189 Pneumonia, unspecified organism: Secondary | ICD-10-CM | POA: Diagnosis not present

## 2020-09-05 DIAGNOSIS — J9601 Acute respiratory failure with hypoxia: Secondary | ICD-10-CM | POA: Diagnosis not present

## 2020-09-13 ENCOUNTER — Emergency Department (HOSPITAL_COMMUNITY)

## 2020-09-13 ENCOUNTER — Inpatient Hospital Stay (HOSPITAL_COMMUNITY)
Admission: EM | Admit: 2020-09-13 | Discharge: 2020-09-23 | DRG: 175 | Disposition: E | Source: Skilled Nursing Facility | Attending: Family Medicine | Admitting: Family Medicine

## 2020-09-13 DIAGNOSIS — I10 Essential (primary) hypertension: Secondary | ICD-10-CM | POA: Diagnosis not present

## 2020-09-13 DIAGNOSIS — I2609 Other pulmonary embolism with acute cor pulmonale: Secondary | ICD-10-CM

## 2020-09-13 DIAGNOSIS — Z7401 Bed confinement status: Secondary | ICD-10-CM | POA: Diagnosis not present

## 2020-09-13 DIAGNOSIS — I110 Hypertensive heart disease with heart failure: Secondary | ICD-10-CM | POA: Diagnosis present

## 2020-09-13 DIAGNOSIS — R079 Chest pain, unspecified: Secondary | ICD-10-CM

## 2020-09-13 DIAGNOSIS — Z96642 Presence of left artificial hip joint: Secondary | ICD-10-CM | POA: Diagnosis present

## 2020-09-13 DIAGNOSIS — Z8261 Family history of arthritis: Secondary | ICD-10-CM | POA: Diagnosis not present

## 2020-09-13 DIAGNOSIS — K828 Other specified diseases of gallbladder: Secondary | ICD-10-CM | POA: Diagnosis not present

## 2020-09-13 DIAGNOSIS — M4856XA Collapsed vertebra, not elsewhere classified, lumbar region, initial encounter for fracture: Secondary | ICD-10-CM | POA: Diagnosis not present

## 2020-09-13 DIAGNOSIS — Z825 Family history of asthma and other chronic lower respiratory diseases: Secondary | ICD-10-CM | POA: Diagnosis not present

## 2020-09-13 DIAGNOSIS — Z7189 Other specified counseling: Secondary | ICD-10-CM | POA: Diagnosis not present

## 2020-09-13 DIAGNOSIS — J9811 Atelectasis: Secondary | ICD-10-CM | POA: Diagnosis not present

## 2020-09-13 DIAGNOSIS — E785 Hyperlipidemia, unspecified: Secondary | ICD-10-CM | POA: Diagnosis not present

## 2020-09-13 DIAGNOSIS — R778 Other specified abnormalities of plasma proteins: Secondary | ICD-10-CM | POA: Diagnosis present

## 2020-09-13 DIAGNOSIS — R0781 Pleurodynia: Secondary | ICD-10-CM | POA: Diagnosis not present

## 2020-09-13 DIAGNOSIS — M069 Rheumatoid arthritis, unspecified: Secondary | ICD-10-CM | POA: Diagnosis present

## 2020-09-13 DIAGNOSIS — Z8701 Personal history of pneumonia (recurrent): Secondary | ICD-10-CM | POA: Diagnosis not present

## 2020-09-13 DIAGNOSIS — Z794 Long term (current) use of insulin: Secondary | ICD-10-CM

## 2020-09-13 DIAGNOSIS — Z8616 Personal history of COVID-19: Secondary | ICD-10-CM

## 2020-09-13 DIAGNOSIS — Z87891 Personal history of nicotine dependence: Secondary | ICD-10-CM | POA: Diagnosis not present

## 2020-09-13 DIAGNOSIS — I5042 Chronic combined systolic (congestive) and diastolic (congestive) heart failure: Secondary | ICD-10-CM | POA: Diagnosis present

## 2020-09-13 DIAGNOSIS — K5090 Crohn's disease, unspecified, without complications: Secondary | ICD-10-CM | POA: Diagnosis not present

## 2020-09-13 DIAGNOSIS — Z789 Other specified health status: Secondary | ICD-10-CM

## 2020-09-13 DIAGNOSIS — K219 Gastro-esophageal reflux disease without esophagitis: Secondary | ICD-10-CM

## 2020-09-13 DIAGNOSIS — Z8249 Family history of ischemic heart disease and other diseases of the circulatory system: Secondary | ICD-10-CM

## 2020-09-13 DIAGNOSIS — I2510 Atherosclerotic heart disease of native coronary artery without angina pectoris: Secondary | ICD-10-CM | POA: Diagnosis present

## 2020-09-13 DIAGNOSIS — Z66 Do not resuscitate: Secondary | ICD-10-CM

## 2020-09-13 DIAGNOSIS — M4854XA Collapsed vertebra, not elsewhere classified, thoracic region, initial encounter for fracture: Secondary | ICD-10-CM | POA: Diagnosis not present

## 2020-09-13 DIAGNOSIS — R404 Transient alteration of awareness: Secondary | ICD-10-CM | POA: Diagnosis not present

## 2020-09-13 DIAGNOSIS — I252 Old myocardial infarction: Secondary | ICD-10-CM

## 2020-09-13 DIAGNOSIS — Z515 Encounter for palliative care: Secondary | ICD-10-CM

## 2020-09-13 DIAGNOSIS — E119 Type 2 diabetes mellitus without complications: Secondary | ICD-10-CM | POA: Diagnosis not present

## 2020-09-13 DIAGNOSIS — F0390 Unspecified dementia without behavioral disturbance: Secondary | ICD-10-CM | POA: Diagnosis not present

## 2020-09-13 DIAGNOSIS — R000 Tachycardia, unspecified: Secondary | ICD-10-CM

## 2020-09-13 DIAGNOSIS — R0989 Other specified symptoms and signs involving the circulatory and respiratory systems: Secondary | ICD-10-CM | POA: Diagnosis not present

## 2020-09-13 DIAGNOSIS — I2699 Other pulmonary embolism without acute cor pulmonale: Principal | ICD-10-CM | POA: Diagnosis present

## 2020-09-13 DIAGNOSIS — R109 Unspecified abdominal pain: Secondary | ICD-10-CM | POA: Diagnosis not present

## 2020-09-13 DIAGNOSIS — J189 Pneumonia, unspecified organism: Secondary | ICD-10-CM | POA: Diagnosis not present

## 2020-09-13 DIAGNOSIS — R7989 Other specified abnormal findings of blood chemistry: Secondary | ICD-10-CM | POA: Diagnosis present

## 2020-09-13 DIAGNOSIS — E1165 Type 2 diabetes mellitus with hyperglycemia: Secondary | ICD-10-CM | POA: Diagnosis not present

## 2020-09-13 DIAGNOSIS — R52 Pain, unspecified: Secondary | ICD-10-CM | POA: Diagnosis not present

## 2020-09-13 DIAGNOSIS — R0789 Other chest pain: Secondary | ICD-10-CM | POA: Diagnosis not present

## 2020-09-13 LAB — HEPATIC FUNCTION PANEL
ALT: 28 U/L (ref 0–44)
AST: 24 U/L (ref 15–41)
Albumin: 3 g/dL — ABNORMAL LOW (ref 3.5–5.0)
Alkaline Phosphatase: 73 U/L (ref 38–126)
Bilirubin, Direct: 0.3 mg/dL — ABNORMAL HIGH (ref 0.0–0.2)
Indirect Bilirubin: 1.1 mg/dL — ABNORMAL HIGH (ref 0.3–0.9)
Total Bilirubin: 1.4 mg/dL — ABNORMAL HIGH (ref 0.3–1.2)
Total Protein: 7.1 g/dL (ref 6.5–8.1)

## 2020-09-13 LAB — CBC
HCT: 51.9 % (ref 39.0–52.0)
Hemoglobin: 16.8 g/dL (ref 13.0–17.0)
MCH: 29.9 pg (ref 26.0–34.0)
MCHC: 32.4 g/dL (ref 30.0–36.0)
MCV: 92.3 fL (ref 80.0–100.0)
Platelets: 225 10*3/uL (ref 150–400)
RBC: 5.62 MIL/uL (ref 4.22–5.81)
RDW: 17.8 % — ABNORMAL HIGH (ref 11.5–15.5)
WBC: 15.7 10*3/uL — ABNORMAL HIGH (ref 4.0–10.5)
nRBC: 0.1 % (ref 0.0–0.2)

## 2020-09-13 LAB — TROPONIN I (HIGH SENSITIVITY)
Troponin I (High Sensitivity): 25 ng/L — ABNORMAL HIGH (ref <18)
Troponin I (High Sensitivity): 22 ng/L — ABNORMAL HIGH (ref <18)

## 2020-09-13 LAB — LIPASE, BLOOD: Lipase: 37 U/L (ref 11–51)

## 2020-09-13 LAB — BASIC METABOLIC PANEL
Anion gap: 14 (ref 5–15)
BUN: 14 mg/dL (ref 8–23)
CO2: 25 mmol/L (ref 22–32)
Calcium: 9.4 mg/dL (ref 8.9–10.3)
Chloride: 96 mmol/L — ABNORMAL LOW (ref 98–111)
Creatinine, Ser: 0.83 mg/dL (ref 0.61–1.24)
GFR, Estimated: >60 mL/min (ref >60)
Glucose, Bld: 227 mg/dL — ABNORMAL HIGH (ref 70–99)
Potassium: 4.8 mmol/L (ref 3.5–5.1)
Sodium: 135 mmol/L (ref 135–145)

## 2020-09-13 LAB — D-DIMER, QUANTITATIVE: D-Dimer, Quant: 1.68 ug/mL-FEU — ABNORMAL HIGH (ref 0.00–0.50)

## 2020-09-13 MED ORDER — CARVEDILOL 3.125 MG PO TABS
6.2500 mg | ORAL_TABLET | Freq: Once | ORAL | Status: AC
  Administered 2020-09-13: 6.25 mg via ORAL
  Filled 2020-09-13: qty 2

## 2020-09-13 MED ORDER — ONDANSETRON HCL 4 MG/2ML IJ SOLN
4.0000 mg | Freq: Once | INTRAMUSCULAR | Status: AC
  Administered 2020-09-13: 4 mg via INTRAVENOUS
  Filled 2020-09-13: qty 2

## 2020-09-13 MED ORDER — MORPHINE SULFATE (PF) 2 MG/ML IV SOLN
2.0000 mg | Freq: Once | INTRAVENOUS | Status: AC
  Administered 2020-09-13: 2 mg via INTRAVENOUS
  Filled 2020-09-13: qty 1

## 2020-09-13 MED ORDER — MORPHINE SULFATE (PF) 4 MG/ML IV SOLN
4.0000 mg | Freq: Once | INTRAVENOUS | Status: AC
  Administered 2020-09-13: 4 mg via INTRAVENOUS
  Filled 2020-09-13: qty 1

## 2020-09-13 NOTE — ED Triage Notes (Signed)
Pt bib [facility] from [facility].   Pt daughter called ems for pt rib pain. Upon arrival pt a&ox3, and not endorsing rib pain   VSS

## 2020-09-13 NOTE — ED Provider Notes (Signed)
[facility] EMERGENCY DEPARTMENT Provider Note   CSN: [id number] Arrival date & time: 09/15/2020  2013     History Chief Complaint  Patient presents with  . Chest Pain  . Shoulder Pain    Anthony [name] is a 86 y.o. male.  86 yo M with a chief complaints of chest pain.  This is right-sided.  Patient denies any trauma denies cough.  Worse with deep breathing.  Denies abdominal pain nausea or vomiting or fever.  Family states the patient has been bedbound for the past couple months or so.  Was recently in the hospital for COVID-19 infection.  Chronically on 2 L of oxygen.  The history is provided by the patient.  Chest Pain Pain location:  R chest Pain quality: sharp and shooting   Pain radiates to:  R shoulder Pain severity:  Moderate Onset quality:  Gradual Duration:  1 day Timing:  Intermittent Progression:  Worsening Chronicity:  Recurrent Relieved by:  Nothing Worsened by:  Nothing Ineffective treatments:  None tried Associated symptoms: shortness of breath   Associated symptoms: no abdominal pain, no fever, no headache, no palpitations and no vomiting   Shoulder Pain Associated symptoms: no fever        Past Medical History:  Diagnosis Date  . Arthritis    rheumatoid-  Dr Barkley Boards  . CHF (congestive heart failure) (HCC)    chronic systolic heart failure per Dr [name] LOV note 09/23/11.   EKG 4/12, stress test  and cath from 8/12 on chart.  Clearance with LOV Dr [name] on chart  . Coronary artery disease   . Crohn disease ([place])   . Dementia ([place])   . Dysrhythmia    nonsustained,asymptomatic VT per Dr [name] office note  resulting in cath  . GERD (gastroesophageal reflux disease)   . Heart attack ([place]) 1996, 06/2011  . Hyperlipidemia   . Hypertension   . Pneumonia 1993  . Prostate troubles    states take alternating meds for bladder/prostate control- "age thing"  . Sleep apnea    sleep study years ago- has apnea but did not qualify for  CPAP    Patient Active Problem List   Diagnosis Date Noted  . Palliative care by specialist   . Goals of care, counseling/discussion   . Acute metabolic encephalopathy 59/56/3875  . Failure to thrive in adult 08/20/2020  . Unspecified protein-calorie malnutrition ([place]) 08/20/2020  . Sepsis due to pneumonia ([place]) 08/19/2020  . Acute respiratory disease due to COVID-19 virus 07/25/2020  . Hypokalemia 03/23/2020  . S/P hip hemiarthroplasty 04/19/2019  . Pressure injury of skin 03/05/2019  . Suspected COVID-19 virus infection 03/01/2019  . Acute respiratory failure with hypoxia ([place]) 03/01/2019  . Hip fracture ([place]) 03/01/2019  . Fall 03/01/2019  . Displaced fracture of base of neck of left femur, initial encounter for closed fracture ([place])   . Fever   . Nonspecific interstitial pneumonia ([place])   . Febrile illness   . Thrush   . Hypoxia   . Sepsis ([place]) 10/04/2018  . Fatty liver 04/06/2018  . Acute pancreatitis 04/04/2018  . Abdominal pain, right lower quadrant 05/28/2015  . Absolute anemia 05/28/2015  . Aortic atherosclerosis ([place]) 05/28/2015  . Altered blood in stool 05/28/2015  . Chronic systolic heart failure ([place]) 05/28/2015  . Syncope and collapse 05/28/2015  . Arteriosclerosis of coronary artery 05/28/2015  . Cough 05/28/2015  . Crohn's disease of large bowel ([place]) 05/28/2015  . Diarrhea 05/28/2015  . Displacement  of cervical intervertebral disc without myelopathy 05/28/2015  . Benign essential HTN 05/28/2015  . Adenopathy 05/28/2015  . Hypercholesterolemia without hypertriglyceridemia 05/28/2015  . Hypo-osmolality and hyponatremia 05/28/2015  . Postinflammatory pulmonary fibrosis ([place]) 05/28/2015  . Healed myocardial infarct 05/28/2015  . Peptic esophagitis 05/28/2015  . 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 Paroxysmal ventricular tachycardia ([place]) 05/28/2015  . Decreased body weight 05/28/2015  . Weakness 11/05/2014  . Nausea with vomiting 11/05/2014  . Generalized weakness    . Dehydration   . Difficulty walking   . Pulmonary infiltrates 07/16/2014  . Chronic combined systolic and diastolic heart failure ([place]) 05/31/2014  . Essential hypertension 05/31/2014  .  H/O Right inguinal hernia repair 01/15/2012  . Small fat containing right inguinal hernia that has been painful 08/05/2011  . Finger wound, simple, open 10/12/2008  . Benign prostatic hyperplasia without urinary obstruction 04/30/2008  . Synovitis and tenosynovitis 04/30/2008  . HLD (hyperlipidemia) 04/12/2008  . COLONIC POLYPS 11/21/2007  . MI 11/21/2007  . Coronary atherosclerosis 11/21/2007  . GERD 11/21/2007  . Rheumatoid arthritis ([place]) 11/21/2007  . SLEEP APNEA, MILD 11/21/2007    Past Surgical History:  Procedure Laterality Date  . CARDIAC CATHETERIZATION     1996/ 2012 report on chart  . COLONOSCOPY    . HIP ARTHROPLASTY Left 03/04/2019   Procedure: LEFT HIP HEMIARTHROPLASTY;  Surgeon: Mcarthur Rossetti, MD;  Location: [facility];  Service: Orthopedics;  Laterality: Left;  . INGUINAL HERNIA REPAIR  11/04/2011   Procedure: HERNIA REPAIR INGUINAL ADULT;  Surgeon: Pedro Earls, MD;  Location: [facility];  Service: General;  Laterality: Right;  Right Inguinal Hernia Repair with Mesh  . KNEE ARTHROSCOPY Right 1976  . MENISCUS DEBRIDEMENT Left 1996  . TRIGGER FINGER RELEASE Right 10/10/2009  . UMBILICAL HERNIA REPAIR  2009  . WRIST SURGERY Left 04/25/91       Family History  Problem Relation Age of Onset  . Heart disease Mother   . Rheum arthritis Mother   . Emphysema Father     Social History   Tobacco Use  . Smoking status: Former Smoker    Packs/day: 3.00    Years: 20.00    Pack years: 60.00    Types: Cigarettes    Quit date: 11/01/1970    Years since quitting: 49.9  . Smokeless tobacco: Never Used  . Tobacco comment: quit 1967  Vaping Use  . Vaping Use: Never used  Substance Use Topics  . Alcohol use: No  . Drug use: No    Home Medications Prior to Admission  medications   Medication Sig Start Date End Date Taking? Authorizing Provider  acetaminophen (TYLENOL) 325 MG tablet Take 650 mg by mouth See admin instructions. Take 2 tablets (650 mg) by mouth three times daily for pain management, may also take 2 tablets (650 mg) every 6 hours as needed for mild pain/fever    [provider]  Artificial Saliva (BIOTENE MOISTURIZING MOUTH MT) Use as directed 2 sprays in the mouth or throat every 6 (six) hours as needed (dry mouth).    [provider]  ascorbic acid (VITAMIN C) 500 MG tablet Take 1,000 mg by mouth daily. COVID prophylaxis    [provider]  b complex vitamins tablet Take 1 tablet by mouth daily.    [provider]  carvedilol (COREG) 3.125 MG tablet Take 1 tablet (3.125 mg total) by mouth 2 (two) times daily with a meal. 08/27/20   Swayze, Ava, DO  docusate sodium (COLACE) 100  MG capsule Take 1 capsule (100 mg total) by mouth 2 (two) times daily. 03/07/19   Regalado, Belkys A, MD  donepezil (ARICEPT) 5 MG tablet Take 5 mg by mouth at bedtime.    [provider]  feeding supplement, ENSURE ENLIVE, (ENSURE ENLIVE) LIQD Take 237 mLs by mouth 2 (two) times daily between meals. Patient not taking: Reported on 03/23/2020 04/07/18   Debbe Odea, MD  guaiFENesin-dextromethorphan (ROBITUSSIN DM) 100-10 MG/5ML syrup Take 5 mLs by mouth every 4 (four) hours as needed for cough. 10/13/18   Arrien, Jimmy Picket, MD  insulin glargine (LANTUS SOLOSTAR) 100 UNIT/ML Solostar Pen Inject 4 Units into the skin daily. 08/27/20   Swayze, Ava, DO  ipratropium-albuterol (DUONEB) 0.5-2.5 (3) MG/3ML SOLN Take 3 mLs by nebulization every 6 (six) hours as needed (shortness of breath/wheezing).    [provider]  LORazepam (ATIVAN) 0.5 MG tablet Take 0.5 mg by mouth in the morning and at bedtime. Give at 4pm and at bedtime    [provider]  Maltodextrin-Xanthan Gum (RESOURCE THICKENUP CLEAR) POWD Take 1 g by mouth  as needed. 08/27/20   Swayze, Ava, DO  mesalamine (LIALDA) 1.2 g EC tablet Take 2.4 g by mouth 2 (two) times daily.     [provider]  Multiple Vitamin (MULTIVITAMIN WITH MINERALS) TABS tablet Take 1 tablet by mouth daily. 08/28/20   Swayze, Ava, DO  nitroGLYCERIN (NITROSTAT) 0.4 MG SL tablet Place 0.4 mg under the tongue every 5 (five) minutes as needed for chest pain.     [provider]  nystatin (MYCOSTATIN) 100000 UNIT/ML suspension Take 5 mLs (500,000 Units total) by mouth 4 (four) times daily. 08/27/20   Swayze, Ava, DO  omeprazole (PRILOSEC) 40 MG capsule Take 40 mg by mouth at bedtime.    [provider]    Allergies    Patient has no known allergies.  Review of Systems   Review of Systems  Constitutional: Negative for chills and fever.  HENT: Negative for congestion and facial swelling.   Eyes: Negative for discharge and visual disturbance.  Respiratory: Positive for shortness of breath.   Cardiovascular: Positive for chest pain. Negative for palpitations.  Gastrointestinal: Negative for abdominal pain, diarrhea and vomiting.  Musculoskeletal: Negative for arthralgias and myalgias.  Skin: Negative for color change and rash.  Neurological: Negative for tremors, syncope and headaches.  Psychiatric/Behavioral: Negative for confusion and dysphoric mood.    Physical Exam Updated Vital Signs BP (!) 140/94   Pulse (!) 135   Temp 97.7 F (36.5 C) (Axillary)   Resp (!) 26   SpO2 99%   Physical Exam Vitals and nursing note reviewed.  Constitutional:      Appearance: He is well-developed.  HENT:     Head: Normocephalic and atraumatic.  Eyes:     Pupils: Pupils are equal, round, and reactive to light.  Neck:     Vascular: No JVD.  Cardiovascular:     Rate and Rhythm: Normal rate and regular rhythm.     Heart sounds: No murmur heard.  No friction rub. No gallop.   Pulmonary:     Effort: No respiratory distress.     Breath sounds: No wheezing.    Abdominal:     General: There is no distension.     Tenderness: There is no guarding or rebound.  Musculoskeletal:        General: Normal range of motion.     Cervical back: Normal range of motion and neck supple.  Skin:    Coloration: Skin is not pale.     Findings: No rash.  Neurological:     Mental Status: He is alert and oriented to person, place, and time.  Psychiatric:        Behavior: Behavior normal.     ED Results / Procedures / Treatments   Labs (all labs ordered are listed, but only abnormal results are displayed) Labs Reviewed  BASIC METABOLIC PANEL - Abnormal; Notable for the following components:      Result Value   Chloride 96 (*)    Glucose, Bld 227 (*)    All other components within normal limits  CBC - Abnormal; Notable for the following components:   WBC 15.7 (*)    RDW 17.8 (*)    All other components within normal limits  HEPATIC FUNCTION PANEL - Abnormal; Notable for the following components:   Albumin 3.0 (*)    Total Bilirubin 1.4 (*)    Bilirubin, Direct 0.3 (*)    Indirect Bilirubin 1.1 (*)    All other components within normal limits  D-DIMER, QUANTITATIVE (NOT AT [facility]) - Abnormal; Notable for the following components:   D-Dimer, Quant 1.68 (*)    All other components within normal limits  TROPONIN I (HIGH SENSITIVITY) - Abnormal; Notable for the following components:   Troponin I (High Sensitivity) 25 (*)    All other components within normal limits  TROPONIN I (HIGH SENSITIVITY) - Abnormal; Notable for the following components:   Troponin I (High Sensitivity) 22 (*)    All other components within normal limits  LIPASE, BLOOD    EKG EKG Interpretation  Date/Time:  Friday September 13 2020 20:16:56 EDT Ventricular Rate:  117 PR Interval:    QRS Duration: 102 QT Interval:  324 QTC Calculation: 452 R Axis:   -155 Text Interpretation: Sinus tachycardia Consider right ventricular hypertrophy Since last tracing rate faster Otherwise no  significant change Confirmed by Deno Etienne [phone]) on 08/28/2020 8:33:20 PM   Radiology DG Chest 2 View  Result Date: 08/31/2020 CLINICAL DATA:  Rib pain EXAM: CHEST - 2 VIEW COMPARISON:  08/19/2020 FINDINGS: Overall inspiratory effort is poor. Cardiac shadow is stable. Basilar atelectasis on the right is noted. No focal infiltrate or effusion is seen. No acute bony abnormality is noted. IMPRESSION: Right basilar atelectasis new from the prior exam. Electronically Signed   By: Inez Catalina M.D.   On: 09/04/2020 21:36    Procedures Procedures (including critical care time)  Medications Ordered in ED Medications  morphine 2 MG/ML injection 2 mg (2 mg Intravenous Given 08/29/2020 2106)  ondansetron (ZOFRAN) injection 4 mg (4 mg Intravenous Given 08/29/2020 2105)  morphine 4 MG/ML injection 4 mg (4 mg Intravenous Given 09/21/2020 2338)  ondansetron (ZOFRAN) injection 4 mg (4 mg Intravenous Given 08/27/2020 2338)  carvedilol (COREG) tablet 6.25 mg (6.25 mg Oral Given 09/15/2020 2338)    ED Course  I have reviewed the triage vital signs and the nursing notes.  Pertinent labs & imaging results that were available during my care of the patient were reviewed by me and considered in my medical decision making (see chart for details).    MDM Rules/Calculators/A&P                          86 yo M with a chief complaints of right-sided chest pain.  Described as sharp.  I am unable to reproduce it on exam.  No obvious abdominal  pain.  No rash.  Will obtain a chest x-ray.  He is significantly tachycardic will obtain a D-dimer lab work reassess.  Patient is persistently tachycardic.  Ongoing pain symptoms.  Will give another dose of pain medicine.  Mild troponin elevation.  D-dimer is positive.  Will obtain a CT angiogram of the chest.  With him having some epigastric and right upper quadrant discomfort will extend to the abdomen and pelvis.  Chest x-ray with poor inspiratory effort, possible right lower  lobe infiltrate though read as likely atelectasis by radiology.  Signed out to Dr. Leonette Monarch, please see their note for further details of care in the ED.  The patients results and plan were reviewed and discussed.   Any x-rays performed were independently reviewed by myself.   Differential diagnosis were considered with the presenting HPI.  Medications  morphine 2 MG/ML injection 2 mg (2 mg Intravenous Given 09/10/2020 2106)  ondansetron (ZOFRAN) injection 4 mg (4 mg Intravenous Given 09/17/2020 2105)  morphine 4 MG/ML injection 4 mg (4 mg Intravenous Given 09/15/2020 2338)  ondansetron (ZOFRAN) injection 4 mg (4 mg Intravenous Given 08/27/2020 2338)  carvedilol (COREG) tablet 6.25 mg (6.25 mg Oral Given 08/27/2020 2338)    Vitals:   09/02/2020 2018 09/03/2020 2245 09/04/2020 2300 09/11/2020 2330  BP: 136/88 (!) 137/97 (!) 152/132 (!) 140/94  Pulse: (!) 119 (!) 130 (!) 131 (!) 135  Resp: (!) 25 (!) 28 (!) 28 (!) 26  Temp: 97.7 F (36.5 C)     TempSrc: Axillary     SpO2: 100% 98% 98% 99%    Final diagnoses:  Right-sided chest pain  Sinus tachycardia    Admission/ observation were discussed with the admitting physician, patient and/or family and they are comfortable with the plan.     Final Clinical Impression(s) / ED Diagnoses Final diagnoses:  Right-sided chest pain  Sinus tachycardia    Rx / DC Orders ED Discharge Orders    None       Deno Etienne, DO 09/14/20 0006

## 2020-09-14 ENCOUNTER — Other Ambulatory Visit (HOSPITAL_COMMUNITY): Payer: PPO

## 2020-09-14 ENCOUNTER — Emergency Department (HOSPITAL_COMMUNITY)

## 2020-09-14 ENCOUNTER — Encounter (HOSPITAL_COMMUNITY): Payer: Self-pay | Admitting: Internal Medicine

## 2020-09-14 DIAGNOSIS — F0390 Unspecified dementia without behavioral disturbance: Secondary | ICD-10-CM | POA: Diagnosis present

## 2020-09-14 DIAGNOSIS — R000 Tachycardia, unspecified: Secondary | ICD-10-CM | POA: Diagnosis present

## 2020-09-14 DIAGNOSIS — J189 Pneumonia, unspecified organism: Secondary | ICD-10-CM | POA: Diagnosis present

## 2020-09-14 DIAGNOSIS — I252 Old myocardial infarction: Secondary | ICD-10-CM | POA: Diagnosis not present

## 2020-09-14 DIAGNOSIS — Z794 Long term (current) use of insulin: Secondary | ICD-10-CM

## 2020-09-14 DIAGNOSIS — I10 Essential (primary) hypertension: Secondary | ICD-10-CM | POA: Diagnosis not present

## 2020-09-14 DIAGNOSIS — E119 Type 2 diabetes mellitus without complications: Secondary | ICD-10-CM | POA: Diagnosis present

## 2020-09-14 DIAGNOSIS — Z515 Encounter for palliative care: Secondary | ICD-10-CM | POA: Diagnosis not present

## 2020-09-14 DIAGNOSIS — I2609 Other pulmonary embolism with acute cor pulmonale: Secondary | ICD-10-CM

## 2020-09-14 DIAGNOSIS — I2510 Atherosclerotic heart disease of native coronary artery without angina pectoris: Secondary | ICD-10-CM | POA: Diagnosis present

## 2020-09-14 DIAGNOSIS — Z8249 Family history of ischemic heart disease and other diseases of the circulatory system: Secondary | ICD-10-CM | POA: Diagnosis not present

## 2020-09-14 DIAGNOSIS — E785 Hyperlipidemia, unspecified: Secondary | ICD-10-CM | POA: Diagnosis present

## 2020-09-14 DIAGNOSIS — Z7189 Other specified counseling: Secondary | ICD-10-CM

## 2020-09-14 DIAGNOSIS — I5042 Chronic combined systolic (congestive) and diastolic (congestive) heart failure: Secondary | ICD-10-CM

## 2020-09-14 DIAGNOSIS — K219 Gastro-esophageal reflux disease without esophagitis: Secondary | ICD-10-CM

## 2020-09-14 DIAGNOSIS — Z96642 Presence of left artificial hip joint: Secondary | ICD-10-CM | POA: Diagnosis present

## 2020-09-14 DIAGNOSIS — Z8616 Personal history of COVID-19: Secondary | ICD-10-CM | POA: Diagnosis not present

## 2020-09-14 DIAGNOSIS — R0989 Other specified symptoms and signs involving the circulatory and respiratory systems: Secondary | ICD-10-CM | POA: Insufficient documentation

## 2020-09-14 DIAGNOSIS — Z789 Other specified health status: Secondary | ICD-10-CM | POA: Diagnosis not present

## 2020-09-14 DIAGNOSIS — Z8261 Family history of arthritis: Secondary | ICD-10-CM | POA: Diagnosis not present

## 2020-09-14 DIAGNOSIS — M4854XA Collapsed vertebra, not elsewhere classified, thoracic region, initial encounter for fracture: Secondary | ICD-10-CM | POA: Diagnosis not present

## 2020-09-14 DIAGNOSIS — Z7401 Bed confinement status: Secondary | ICD-10-CM | POA: Diagnosis not present

## 2020-09-14 DIAGNOSIS — Z8701 Personal history of pneumonia (recurrent): Secondary | ICD-10-CM | POA: Diagnosis not present

## 2020-09-14 DIAGNOSIS — R778 Other specified abnormalities of plasma proteins: Secondary | ICD-10-CM

## 2020-09-14 DIAGNOSIS — M4856XA Collapsed vertebra, not elsewhere classified, lumbar region, initial encounter for fracture: Secondary | ICD-10-CM | POA: Diagnosis not present

## 2020-09-14 DIAGNOSIS — M069 Rheumatoid arthritis, unspecified: Secondary | ICD-10-CM | POA: Diagnosis present

## 2020-09-14 DIAGNOSIS — R109 Unspecified abdominal pain: Secondary | ICD-10-CM | POA: Diagnosis not present

## 2020-09-14 DIAGNOSIS — R0781 Pleurodynia: Secondary | ICD-10-CM | POA: Diagnosis not present

## 2020-09-14 DIAGNOSIS — K5090 Crohn's disease, unspecified, without complications: Secondary | ICD-10-CM | POA: Diagnosis present

## 2020-09-14 DIAGNOSIS — R7989 Other specified abnormal findings of blood chemistry: Secondary | ICD-10-CM | POA: Diagnosis present

## 2020-09-14 DIAGNOSIS — K828 Other specified diseases of gallbladder: Secondary | ICD-10-CM | POA: Diagnosis not present

## 2020-09-14 DIAGNOSIS — Z825 Family history of asthma and other chronic lower respiratory diseases: Secondary | ICD-10-CM | POA: Diagnosis not present

## 2020-09-14 DIAGNOSIS — I2699 Other pulmonary embolism without acute cor pulmonale: Secondary | ICD-10-CM | POA: Diagnosis present

## 2020-09-14 DIAGNOSIS — J9811 Atelectasis: Secondary | ICD-10-CM | POA: Diagnosis not present

## 2020-09-14 DIAGNOSIS — Z66 Do not resuscitate: Secondary | ICD-10-CM | POA: Diagnosis present

## 2020-09-14 DIAGNOSIS — I110 Hypertensive heart disease with heart failure: Secondary | ICD-10-CM | POA: Diagnosis present

## 2020-09-14 DIAGNOSIS — Z87891 Personal history of nicotine dependence: Secondary | ICD-10-CM | POA: Diagnosis not present

## 2020-09-14 LAB — CBC WITH DIFFERENTIAL/PLATELET
Abs Immature Granulocytes: 0.32 10*3/uL — ABNORMAL HIGH (ref 0.00–0.07)
Basophils Absolute: 0.1 10*3/uL (ref 0.0–0.1)
Basophils Relative: 0 %
Eosinophils Absolute: 0 10*3/uL (ref 0.0–0.5)
Eosinophils Relative: 0 %
HCT: 45.5 % (ref 39.0–52.0)
Hemoglobin: 14.8 g/dL (ref 13.0–17.0)
Immature Granulocytes: 2 %
Lymphocytes Relative: 6 %
Lymphs Abs: 1.1 10*3/uL (ref 0.7–4.0)
MCH: 30.1 pg (ref 26.0–34.0)
MCHC: 32.5 g/dL (ref 30.0–36.0)
MCV: 92.5 fL (ref 80.0–100.0)
Monocytes Absolute: 1.2 10*3/uL — ABNORMAL HIGH (ref 0.1–1.0)
Monocytes Relative: 6 %
Neutro Abs: 16.1 10*3/uL — ABNORMAL HIGH (ref 1.7–7.7)
Neutrophils Relative %: 86 %
Platelets: 245 10*3/uL (ref 150–400)
RBC: 4.92 MIL/uL (ref 4.22–5.81)
RDW: 17.2 % — ABNORMAL HIGH (ref 11.5–15.5)
WBC: 18.8 10*3/uL — ABNORMAL HIGH (ref 4.0–10.5)
nRBC: 0.2 % (ref 0.0–0.2)

## 2020-09-14 LAB — CBG MONITORING, ED
Glucose-Capillary: 211 mg/dL — ABNORMAL HIGH (ref 70–99)
Glucose-Capillary: 164 mg/dL — ABNORMAL HIGH (ref 70–99)

## 2020-09-14 LAB — COMPREHENSIVE METABOLIC PANEL
ALT: 26 U/L (ref 0–44)
AST: 14 U/L — ABNORMAL LOW (ref 15–41)
Albumin: 2.7 g/dL — ABNORMAL LOW (ref 3.5–5.0)
Alkaline Phosphatase: 67 U/L (ref 38–126)
Anion gap: 14 (ref 5–15)
BUN: 15 mg/dL (ref 8–23)
CO2: 24 mmol/L (ref 22–32)
Calcium: 9.4 mg/dL (ref 8.9–10.3)
Chloride: 98 mmol/L (ref 98–111)
Creatinine, Ser: 0.96 mg/dL (ref 0.61–1.24)
GFR, Estimated: >60 mL/min (ref >60)
Glucose, Bld: 217 mg/dL — ABNORMAL HIGH (ref 70–99)
Potassium: 4.5 mmol/L (ref 3.5–5.1)
Sodium: 136 mmol/L (ref 135–145)
Total Bilirubin: 1.4 mg/dL — ABNORMAL HIGH (ref 0.3–1.2)
Total Protein: 6.3 g/dL — ABNORMAL LOW (ref 6.5–8.1)

## 2020-09-14 LAB — MAGNESIUM: Magnesium: 1.8 mg/dL (ref 1.7–2.4)

## 2020-09-14 LAB — HEPARIN LEVEL (UNFRACTIONATED): Heparin Unfractionated: 0.68 IU/mL (ref 0.30–0.70)

## 2020-09-14 LAB — GLUCOSE, CAPILLARY: Glucose-Capillary: 154 mg/dL — ABNORMAL HIGH (ref 70–99)

## 2020-09-14 LAB — C-REACTIVE PROTEIN: CRP: 37.6 mg/dL — ABNORMAL HIGH (ref <1.0)

## 2020-09-14 LAB — PROCALCITONIN: Procalcitonin: 0.31 ng/mL

## 2020-09-14 LAB — HIV ANTIBODY (ROUTINE TESTING W REFLEX): HIV Screen 4th Generation wRfx: NONREACTIVE

## 2020-09-14 MED ORDER — IOHEXOL 350 MG/ML SOLN
100.0000 mL | Freq: Once | INTRAVENOUS | Status: AC | PRN
  Administered 2020-09-14: 100 mL via INTRAVENOUS

## 2020-09-14 MED ORDER — NITROGLYCERIN 0.4 MG SL SUBL: 0.4000 mg | SUBLINGUAL_TABLET | SUBLINGUAL | Status: DC | PRN

## 2020-09-14 MED ORDER — PANTOPRAZOLE SODIUM 40 MG PO TBEC: 40.0000 mg | DELAYED_RELEASE_TABLET | Freq: Every day | ORAL | Status: DC

## 2020-09-14 MED ORDER — SODIUM CHLORIDE 0.9 % IV SOLN
500.0000 mg | Freq: Every day | INTRAVENOUS | Status: DC
  Administered 2020-09-14: 500 mg via INTRAVENOUS
  Filled 2020-09-14: qty 500

## 2020-09-14 MED ORDER — ONDANSETRON HCL 4 MG PO TABS: 4.0000 mg | ORAL_TABLET | Freq: Four times a day (QID) | ORAL | Status: DC | PRN

## 2020-09-14 MED ORDER — GLYCOPYRROLATE 0.2 MG/ML IJ SOLN
0.4000 mg | Freq: Once | INTRAMUSCULAR | Status: AC
  Administered 2020-09-14: 0.4 mg via INTRAVENOUS
  Filled 2020-09-14: qty 2

## 2020-09-14 MED ORDER — LORAZEPAM 2 MG/ML IJ SOLN
1.0000 mg | INTRAMUSCULAR | Status: DC | PRN
  Administered 2020-09-14 – 2020-09-17 (×14): 1 mg via INTRAVENOUS
  Filled 2020-09-14 (×14): qty 1

## 2020-09-14 MED ORDER — MORPHINE SULFATE (PF) 2 MG/ML IV SOLN: 1.0000 mg | INTRAVENOUS | Status: DC | PRN

## 2020-09-14 MED ORDER — INSULIN ASPART 100 UNIT/ML ~~LOC~~ SOLN
0.0000 [IU] | Freq: Three times a day (TID) | SUBCUTANEOUS | Status: DC
  Administered 2020-09-14: 5 [IU] via SUBCUTANEOUS
  Administered 2020-09-14: 3 [IU] via SUBCUTANEOUS

## 2020-09-14 MED ORDER — BIOTENE DRY MOUTH MT LIQD
15.0000 mL | Freq: Two times a day (BID) | OROMUCOSAL | Status: DC
  Administered 2020-09-14 – 2020-09-17 (×5): 15 mL via TOPICAL

## 2020-09-14 MED ORDER — POLYETHYLENE GLYCOL 3350 17 G PO PACK: 17.0000 g | PACK | Freq: Every day | ORAL | Status: DC | PRN

## 2020-09-14 MED ORDER — DOCUSATE SODIUM 100 MG PO CAPS: 100.0000 mg | ORAL_CAPSULE | Freq: Two times a day (BID) | ORAL | Status: DC

## 2020-09-14 MED ORDER — MORPHINE SULFATE (PF) 2 MG/ML IV SOLN
2.0000 mg | INTRAVENOUS | Status: DC | PRN
  Administered 2020-09-14: 2 mg via INTRAVENOUS
  Filled 2020-09-14: qty 1

## 2020-09-14 MED ORDER — ACETAMINOPHEN 650 MG RE SUPP
650.0000 mg | Freq: Four times a day (QID) | RECTAL | Status: DC | PRN
  Administered 2020-09-15 – 2020-09-16 (×3): 650 mg via RECTAL
  Filled 2020-09-14 (×3): qty 1

## 2020-09-14 MED ORDER — HALOPERIDOL LACTATE 5 MG/ML IJ SOLN: 2.0000 mg | Freq: Four times a day (QID) | INTRAMUSCULAR | Status: DC | PRN

## 2020-09-14 MED ORDER — MORPHINE SULFATE (PF) 2 MG/ML IV SOLN
1.0000 mg | INTRAVENOUS | Status: DC | PRN
  Administered 2020-09-17: 2 mg via INTRAVENOUS
  Filled 2020-09-14 (×2): qty 1

## 2020-09-14 MED ORDER — HEPARIN (PORCINE) 25000 UT/250ML-% IV SOLN
1100.0000 [IU]/h | INTRAVENOUS | Status: DC
  Administered 2020-09-14: 1100 [IU]/h via INTRAVENOUS
  Filled 2020-09-14 (×2): qty 250

## 2020-09-14 MED ORDER — LORAZEPAM 1 MG PO TABS: 0.5000 mg | ORAL_TABLET | Freq: Two times a day (BID) | ORAL | Status: DC

## 2020-09-14 MED ORDER — CARVEDILOL 3.125 MG PO TABS: 3.1250 mg | ORAL_TABLET | Freq: Two times a day (BID) | ORAL | Status: DC

## 2020-09-14 MED ORDER — SODIUM CHLORIDE 0.9 % IV SOLN
2.0000 g | Freq: Every day | INTRAVENOUS | Status: DC
  Administered 2020-09-14: 2 g via INTRAVENOUS
  Filled 2020-09-14: qty 20

## 2020-09-14 MED ORDER — ONDANSETRON HCL 4 MG/2ML IJ SOLN: 4.0000 mg | Freq: Four times a day (QID) | INTRAMUSCULAR | Status: DC | PRN

## 2020-09-14 MED ORDER — LORAZEPAM 2 MG/ML IJ SOLN
1.0000 mg | INTRAMUSCULAR | Status: DC | PRN
  Administered 2020-09-14: 1 mg via INTRAVENOUS
  Filled 2020-09-14: qty 1

## 2020-09-14 MED ORDER — ACETAMINOPHEN 325 MG PO TABS: 650.0000 mg | ORAL_TABLET | Freq: Four times a day (QID) | ORAL | Status: DC | PRN

## 2020-09-14 MED ORDER — MESALAMINE 1.2 G PO TBEC
2.4000 g | DELAYED_RELEASE_TABLET | Freq: Two times a day (BID) | ORAL | Status: DC
  Filled 2020-09-14: qty 2

## 2020-09-14 MED ORDER — POLYVINYL ALCOHOL 1.4 % OP SOLN
1.0000 [drp] | Freq: Four times a day (QID) | OPHTHALMIC | Status: DC | PRN
  Filled 2020-09-14: qty 15

## 2020-09-14 MED ORDER — HEPARIN BOLUS VIA INFUSION
5000.0000 [IU] | Freq: Once | INTRAVENOUS | Status: AC
  Administered 2020-09-14: 5000 [IU] via INTRAVENOUS
  Filled 2020-09-14: qty 5000

## 2020-09-14 MED ORDER — GLYCOPYRROLATE 0.2 MG/ML IJ SOLN
0.2000 mg | INTRAMUSCULAR | Status: DC | PRN
  Administered 2020-09-18: 0.2 mg via INTRAVENOUS
  Filled 2020-09-14: qty 1

## 2020-09-14 MED ORDER — LACTATED RINGERS IV BOLUS
250.0000 mL | Freq: Once | INTRAVENOUS | Status: AC
  Administered 2020-09-14: 250 mL via INTRAVENOUS

## 2020-09-14 MED ORDER — DONEPEZIL HCL 5 MG PO TABS: 5.0000 mg | ORAL_TABLET | Freq: Every day | ORAL | Status: DC

## 2020-09-14 MED ORDER — MORPHINE SULFATE (PF) 4 MG/ML IV SOLN
4.0000 mg | Freq: Once | INTRAVENOUS | Status: AC
  Administered 2020-09-14: 4 mg via INTRAVENOUS
  Filled 2020-09-14: qty 1

## 2020-09-14 MED ORDER — LACTATED RINGERS IV SOLN: INTRAVENOUS | Status: DC

## 2020-09-14 MED ORDER — MORPHINE SULFATE (PF) 2 MG/ML IV SOLN
2.0000 mg | INTRAVENOUS | Status: DC
  Administered 2020-09-14 – 2020-09-18 (×30): 2 mg via INTRAVENOUS
  Filled 2020-09-14 (×29): qty 1

## 2020-09-14 MED ORDER — IPRATROPIUM-ALBUTEROL 0.5-2.5 (3) MG/3ML IN SOLN: 3.0000 mL | Freq: Four times a day (QID) | RESPIRATORY_TRACT | Status: DC | PRN

## 2020-09-14 NOTE — Progress Notes (Addendum)
Brief Palliative Care Progress Note:  Received page from patient's primary RN stating family would like to speak with me.   Went to patient's bedside - two daughters, [name] and [name], were present.  Spoke with family: they are agreeable to discontinue heparin drip and CBG/insulin - orders discontinued. Answered questions around symptom management plan, - including pain, anxiety, and shortness of breath.  Family also states they no longer want transfer to [facility], prefer hospital death. [facility] hospice liaison notified.  Anticipate hospital death at this time, likely hours to days. PMT will continue to follow holistically.  Thank you for allowing PMT to assist in the care of this patient.  Archita Lomeli M. [name] [facility] Palliative Medicine Team Team Phone: [phone]  NO CHARGE

## 2020-09-14 NOTE — Consult Note (Signed)
Consultation Note Date: 09/14/2020   Patient Name: Anthony [name]  DOB: [birth year]  MRN: [id number]  Age / Sex: 86 y.o., male  PCP: Josetta Huddle, MD Referring Physician: Geradine Girt, DO  Reason for Consultation: Disposition, Establishing goals of care, Non pain symptom management, Pain control, Psychosocial/spiritual support and Terminal Care  HPI/Patient Profile: 86 y.o. male  with past medical history of hypertension, CHF, hyperlipidemia, dementia, GERD, Crohn's disease, diabetes mellitus type 2 presented to the ED on 09/14/20 from [facility] due to family concerns for chest pain and shortness of breath. Patient recently had COVID19 infection on 07/25/20 and was also recently hospitalized at [facility] from 9/27-10/5/21 for pneumonia. He was discharged on 08/27/20 back to [facility] with home hospice services with [facility] [employer].  Patient's daughters initially revoked hospice services in ED to allow work up and treatment. Patient was found to have acute PE and bilateral pneumonia. Family allowed a time limited trial of treatment with watchful waiting and patient showed no improvements.  Family face treatment option decisions, advanced directive decisions, and anticipatory care needs.  Clinical Assessment and Goals of Care: I have reviewed medical records including EPIC notes, labs, and imaging. Received report from primary RN - no acute concerns. RN was on her way to administer ativan and morphine.  Went to visit patient at bedside - daughter/Anthony [name] was present. Patient was lying in bed - he did not respond to voice or gentle touch, he was not able to participate in conversation, he was moaning - RN administered ativan and morphine. No respiratory distress, increased work of breathing, or secretions noted.   Met with patient's daughter/Anthony [name] at bedside  to discuss diagnosis, prognosis, GOC, EOL  wishes, disposition, and options.  I introduced Palliative Medicine as specialized medical care for people living with serious illness. It focuses on providing relief from the symptoms and stress of a serious illness. The goal is to improve quality of life for both the patient and the family.  Prior to hospitalization, the patient was receiving home hospice support at [facility] expressed understanding of comfort measures. Daughters brought patient to the ED because they felt the patient's pain was not being well managed at [facility] and wanted initial work up when he arrived.    We discussed patient's current illness and what it means in the larger context of patient's on-going co-morbidities.  Natural disease trajectory and expectations at EOL were discussed. I attempted to elicit values and goals of care important to the patient. The difference between aggressive medical intervention and comfort care was considered in light of the patient's goals of care. Anthony [name] had a clear understanding of the patient's current medical situation. She expressed sadness and became tearful, telling me that her mother/patient's wife passed just several weeks ago and how this situation was hard for her. Emotional support and therapeutic listening was provided.   We talked about transition to comfort measures in house and what that would entail inclusive of medications to control pain, dyspnea, agitation, nausea,  itching, and hiccups. We discussed stopping all uneccessary measures such as blood draws, needle sticks, oxygen, antibiotics, CBGs/insulin, cardiac monitoring, and frequent vital signs - with the goal of comfort rather than cure/prolonging life. Detailed education was provided around why discontinuing artificial hydration is recommended at end of life as well as the body's natural process of not wanting to eat or drink at end of life - Anthony [name] expressed understanding.  Discussed transferring the  patient to [facility] - he seems to be stable at this time for transfer, but educated that assessments were ongoing and his window of opportunity for transfer might pass, where at that time we would continue comfort care for the patient in house. Anthony [name] was agreeable to [facility] transfer as long as the patient seemed stable.  Education provided on current [place] EOL visitation policy.   Visit also consisted of discussions dealing with the complex and emotionally intense issues of symptom management and palliative care in the setting of serious and life-threatening illness. Palliative care team will continue to support patient, patient's family, and medical team.  Discussed with family the importance of continued conversation with each other and the medical providers regarding overall plan of care and treatment options, ensuring decisions are within the context of the patient's values and GOCs.    Questions and concerns were addressed. The family was encouraged to call with questions and/or concerns. PMT card was provided.    Primary Decision Maker: NEXT OF KIN  Patient has two daughters: Anthony [name] Anthony [name]    SUMMARY OF RECOMMENDATIONS: Full comfort measures initiated, patient likely has hours to days Continue DNR/DNI as previously documented Transfer to [facility] when bed becomes available - TOC consult placed; TOC and hospice liaison notifed Added orders to ensure EOL comfort and to reflect full comfort measures, as well as discontinued orders that were not focused on comfort Unrestricted visitation orders were placed per current [place] EOL visitation policy  Provide frequent assessments and administer PRN medications as clinically necessary to ensure EOL comfort  PMT will continue to follow holistically  Code Status/Advance Care Planning:  DNR  Palliative Prophylaxis:   Aspiration, Bowel Regimen, Delirium Protocol, Frequent Pain Assessment,  Oral Care and Turn Reposition  Additional Recommendations (Limitations, Scope, Preferences):  Full Comfort Care  Psycho-social/Spiritual:   Desire for further Chaplaincy support:no Created space and opportunity for family to express thoughts and feelings regarding patient's current medical situation.   Emotional support provided.  Prognosis:   Hours - Days  Discharge Planning: Hospice facility      Primary Diagnoses: Present on Admission: . Acute pulmonary embolism ([place]) . Chronic combined systolic and diastolic heart failure ([place]) . Essential hypertension . Dementia without behavioral disturbance ([place]) . Pleuritic chest pain . Elevated troponin level not due myocardial infarction   I have reviewed the medical record, interviewed the patient and family, and examined the patient. The following aspects are pertinent.  Past Medical History:  Diagnosis Date  . Arthritis    rheumatoid-  Dr Barkley Boards  . CHF (congestive heart failure) (HCC)    chronic systolic heart failure per Dr [name] LOV note 09/23/11.   EKG 4/12, stress test  and cath from 8/12 on chart.  Clearance with LOV Dr [name] on chart  . Coronary artery disease   . Crohn disease ([place])   . Dementia ([place])   . Dysrhythmia    nonsustained,asymptomatic VT per Dr [name] office note  resulting in cath  . GERD (gastroesophageal  reflux disease)   . Heart attack ([place]) 1996, 06/2011  . Hyperlipidemia   . Hypertension   . Pneumonia 1993  . Prostate troubles    states take alternating meds for bladder/prostate control- "age thing"  . Sleep apnea    sleep study years ago- has apnea but did not qualify for CPAP   Social History   Socioeconomic History  . Marital status: Married    Spouse name: Not on file  . Number of children: Not on file  . Years of education: Not on file  . Highest education level: Not on file  Occupational History  . Not on file  Tobacco Use  . Smoking status: Former Smoker    Packs/day: 3.00     Years: 20.00    Pack years: 60.00    Types: Cigarettes    Quit date: 11/01/1970    Years since quitting: 49.9  . Smokeless tobacco: Never Used  . Tobacco comment: quit 1967  Vaping Use  . Vaping Use: Never used  Substance and Sexual Activity  . Alcohol use: No  . Drug use: No  . Sexual activity: Not on file  Other Topics Concern  . Not on file  Social History Narrative  . Not on file   Social Determinants of Health   Financial Resource Strain:   . Difficulty of Paying Living Expenses: Not on file  Food Insecurity:   . Worried About Charity fundraiser in the Last Year: Not on file  . Ran Out of Food in the Last Year: Not on file  Transportation Needs:   . Lack of Transportation (Medical): Not on file  . Lack of Transportation (Non-Medical): Not on file  Physical Activity:   . Days of Exercise per Week: Not on file  . Minutes of Exercise per Session: Not on file  Stress:   . Feeling of Stress : Not on file  Social Connections:   . Frequency of Communication with Friends and Family: Not on file  . Frequency of Social Gatherings with Friends and Family: Not on file  . Attends Religious Services: Not on file  . Active Member of Clubs or Organizations: Not on file  . Attends Archivist Meetings: Not on file  . Marital Status: Not on file   Family History  Problem Relation Age of Onset  . Heart disease Mother   . Rheum arthritis Mother   . Emphysema Father    Scheduled Meds: . insulin aspart  0-15 Units Subcutaneous TID AC & HS   Continuous Infusions: . azithromycin Stopped (09/14/20 0739)  . cefTRIAXone (ROCEPHIN)  IV Stopped (09/14/20 0622)  . heparin 1,100 Units/hr (09/14/20 0310)  . lactated ringers 100 mL/hr at 09/14/20 0622   PRN Meds:.acetaminophen **OR** acetaminophen, ipratropium-albuterol, LORazepam, morphine injection, nitroGLYCERIN, ondansetron **OR** ondansetron (ZOFRAN) IV Medications Prior to Admission:  Prior to Admission medications     Medication Sig Start Date End Date Taking? Authorizing Provider  acetaminophen (TYLENOL) 325 MG tablet Take 650 mg by mouth See admin instructions. Take 2 tablets (650 mg) by mouth three times daily for pain management, may also take 2 tablets (650 mg) every 6 hours as needed for mild pain/fever   Yes [provider]  Artificial Saliva (BIOTENE MOISTURIZING MOUTH MT) Use as directed 2 sprays in the mouth or throat every 6 (six) hours as needed (dry mouth).   Yes [provider]  carvedilol (COREG) 3.125 MG tablet Take 1 tablet (3.125 mg total) by mouth 2 (  two) times daily with a meal. 08/27/20  Yes Swayze, Ava, DO  cholecalciferol (VITAMIN D3) 25 MCG (1000 UNIT) tablet Take 1,000 Units by mouth daily.   Yes [provider]  docusate sodium (COLACE) 100 MG capsule Take 1 capsule (100 mg total) by mouth 2 (two) times daily. 03/07/19  Yes Regalado, Belkys A, MD  donepezil (ARICEPT) 5 MG tablet Take 5 mg by mouth at bedtime.   Yes [provider]  guaiFENesin-dextromethorphan (ROBITUSSIN DM) 100-10 MG/5ML syrup Take 5 mLs by mouth every 4 (four) hours as needed for cough. 10/13/18  Yes Arrien, Jimmy Picket, MD  insulin aspart (NOVOLOG FLEXPEN RELION) 100 UNIT/ML FlexPen Inject 0-10 Units into the skin daily. 71-150, 0 units 151-200, 2 units 201-250, 4 units 251-300, 6 units 301-350, 8 units 351-400, 10 units If greater than 400, call DR    Yes [provider]  ipratropium-albuterol (DUONEB) 0.5-2.5 (3) MG/3ML SOLN Take 3 mLs by nebulization every 6 (six) hours as needed (shortness of breath/wheezing).   Yes [provider]  LORazepam (ATIVAN) 0.5 MG tablet Take 0.5 mg by mouth in the morning and at bedtime. Give at 6pm and at bedtime   Yes [provider]  Maltodextrin-Xanthan Gum (RESOURCE THICKENUP CLEAR) POWD Take 1 g by mouth as needed. Patient taking differently: Take 1 g by mouth daily as needed. Every thing except Water.Thin Water  08/27/20  Yes Swayze, Ava, DO  melatonin 3 MG TABS tablet Take 3 mg by mouth at bedtime.   Yes [provider]  Menthol-Zinc Oxide (CALMOSEPTINE) 0.44-20.6 % OINT Apply 1 application topically 2 (two) times daily. Apply to Groin area   Yes [provider]  mesalamine (LIALDA) 1.2 g EC tablet Take 2.4 g by mouth 2 (two) times daily.    Yes [provider]  Multiple Vitamin (MULTIVITAMIN WITH MINERALS) TABS tablet Take 1 tablet by mouth daily. 08/28/20  Yes Swayze, Ava, DO  nitroGLYCERIN (NITROSTAT) 0.4 MG SL tablet Place 0.4 mg under the tongue every 5 (five) minutes as needed for chest pain.    Yes [provider]  nystatin (MYCOSTATIN) 100000 UNIT/ML suspension Take 5 mLs (500,000 Units total) by mouth 4 (four) times daily. 08/27/20  Yes Swayze, Ava, DO  omeprazole (PRILOSEC) 40 MG capsule Take 40 mg by mouth at bedtime.   Yes [provider]  OXYGEN Inhale 2 L into the lungs continuous.   Yes [provider]  traMADol (ULTRAM) 50 MG tablet Take 50 mg by mouth 2 (two) times daily.   Yes [provider]  trimethoprim-polymyxin b (POLYTRIM) ophthalmic solution Place 2 drops into both eyes in the morning and at bedtime. 1030 am & 2000   Yes [provider]   No Known Allergies Review of Systems  Unable to perform ROS: Acuity of condition    Physical Exam Vitals and nursing note reviewed.  Constitutional:      General: He is not in acute distress.    Appearance: He is ill-appearing.  Pulmonary:     Effort: No respiratory distress.  Skin:    General: Skin is warm and dry.  Neurological:     Mental Status: He is unresponsive.     Motor: Weakness present.  Psychiatric:        Speech: He is noncommunicative.     Vital Signs: BP 101/66   Pulse (!) 115   Temp 100.3 F (37.9 C) (Axillary)   Resp (!) 27   SpO2 95%  Pain Scale:  0-10   Pain Score: 3    SpO2: SpO2: 95 % O2 Device:SpO2: 95 % O2 Flow Rate: .   IO:  Intake/output summary: No intake or output data in the 24 hours ending 09/14/20 1139  LBM:   Baseline Weight:   Most recent weight:       Palliative Assessment/Data: PPS 10%     Time In: 1145 Time Out: 1258 Time Total: 73 minutes  Greater than 50%  of this time was spent counseling and coordinating care related to the above assessment and plan.  Signed by: Lin Landsman, NP   Please contact Palliative Medicine Team phone at [phone] for questions and concerns.  For individual provider: See Shea Evans

## 2020-09-14 NOTE — ED Notes (Signed)
Pt transferred to 6N with isolation precautions, pt had an incontinence episode on the way to the unit, diaper removed on the unit, no nursing staff respond to the room after be notified 2 times by unit secretary that the pt was in the unit. Unit NT and secretary notified about pt been left on the room resting comfortable on bed with 3 bed rails up.

## 2020-09-14 NOTE — H&P (Addendum)
History and Physical    Anthony [name] HUD:[id number] DOB: [birth year] DOA: 08/31/2020  PCP: Josetta Huddle, MD  Patient coming from: Home   Chief Complaint:  Chief Complaint  Patient presents with  . Chest Pain  . Shoulder Pain     HPI:    86 year old male with past medical history of rheumatoid arthritis, systolic and diastolic congestive heart failure (Echo 03/2020 EF 45-50%), hyperlipidemia, Crohn's disease, dementia, gastroesophageal reflux disease, diabetes mellitus type 2 who presents to [facility] emergency department via EMS from [facility] assisted-living facility due to family concerns for chest pain and shortness of breath.  Of note, patient was recently hospitalized at [facility] from 9/27-10/5 for bilateral lower lobe infiltrates with leukocytosis concerning for pneumonia.  Patient was eventually discharged on 10/5 back to [facility] with home health hospice services.  Patient is an extremely poor historian due to known history of advanced dementia.  Majority the history is been obtained from the daughter who is at the bedside.  According to the daughter, patient has been exhibiting progressively worsening shortness of breath and chest discomfort over the past several days.  Chest discomfort is located in the right chest, worse with deep inspiration.  Additional descriptors are unable to be provided due to patient's advanced dementia.  Daughter reports that she and her sister wish to revoke the patient's hospice services for now during the course of this hospitalization.  Upon evaluation in the emergency department, patient has been found to be tachypneic and tachycardic with ongoing signs of discomfort.  Patient was found to have an elevated D-dimer of 1.68 with slight elevation of troponin of 22 and therefore CT angiogram of the chest was performed revealing pulmonary emboli of the right main pulmonary artery with extension into the right upper lobe  and right lower lobe with additional consolidation of the right greater than left lung bases.  Patient was initiated on supplemental oxygen as well as heparin infusion.  The hospitalist group was then called to assess the patient for admission the hospital.    Review of Systems:   Review of Systems  Unable to perform ROS: Dementia    Past Medical History:  Diagnosis Date  . Arthritis    rheumatoid-  Dr Barkley Boards  . CHF (congestive heart failure) (HCC)    chronic systolic heart failure per Dr [name] LOV note 09/23/11.   EKG 4/12, stress test  and cath from 8/12 on chart.  Clearance with LOV Dr [name] on chart  . Coronary artery disease   . Crohn disease ([place])   . Dementia ([place])   . Dysrhythmia    nonsustained,asymptomatic VT per Dr [name] office note  resulting in cath  . GERD (gastroesophageal reflux disease)   . Heart attack ([place]) 1996, 06/2011  . Hyperlipidemia   . Hypertension   . Pneumonia 1993  . Prostate troubles    states take alternating meds for bladder/prostate control- "age thing"  . Sleep apnea    sleep study years ago- has apnea but did not qualify for CPAP    Past Surgical History:  Procedure Laterality Date  . CARDIAC CATHETERIZATION     1996/ 2012 report on chart  . COLONOSCOPY    . HIP ARTHROPLASTY Left 03/04/2019   Procedure: LEFT HIP HEMIARTHROPLASTY;  Surgeon: Mcarthur Rossetti, MD;  Location: [facility];  Service: Orthopedics;  Laterality: Left;  . INGUINAL HERNIA REPAIR  11/04/2011   Procedure: HERNIA REPAIR INGUINAL ADULT;  Surgeon: Pedro Earls, MD;  Location: [facility];  Service: General;  Laterality: Right;  Right Inguinal Hernia Repair with Mesh  . KNEE ARTHROSCOPY Right 1976  . MENISCUS DEBRIDEMENT Left 1996  . TRIGGER FINGER RELEASE Right 10/10/2009  . UMBILICAL HERNIA REPAIR  2009  . WRIST SURGERY Left 04/25/91     reports that he quit smoking about 49 years ago. His smoking use included cigarettes. He has a 60.00 pack-year smoking history. He  has never used smokeless tobacco. He reports that he does not drink alcohol and does not use drugs.  No Known Allergies  Family History  Problem Relation Age of Onset  . Heart disease Mother   . Rheum arthritis Mother   . Emphysema Father      Prior to Admission medications   Medication Sig Start Date End Date Taking? Authorizing Provider  traMADol (ULTRAM) 50 MG tablet Take 50 mg by mouth 2 (two) times daily.   Yes [provider]  acetaminophen (TYLENOL) 325 MG tablet Take 650 mg by mouth See admin instructions. Take 2 tablets (650 mg) by mouth three times daily for pain management, may also take 2 tablets (650 mg) every 6 hours as needed for mild pain/fever    [provider]  Artificial Saliva (BIOTENE MOISTURIZING MOUTH MT) Use as directed 2 sprays in the mouth or throat every 6 (six) hours as needed (dry mouth).    [provider]  ascorbic acid (VITAMIN C) 500 MG tablet Take 1,000 mg by mouth daily. COVID prophylaxis    [provider]  b complex vitamins tablet Take 1 tablet by mouth daily.    [provider]  carvedilol (COREG) 3.125 MG tablet Take 1 tablet (3.125 mg total) by mouth 2 (two) times daily with a meal. 08/27/20   Swayze, Ava, DO  docusate sodium (COLACE) 100 MG capsule Take 1 capsule (100 mg total) by mouth 2 (two) times daily. 03/07/19   Regalado, Belkys A, MD  donepezil (ARICEPT) 5 MG tablet Take 5 mg by mouth at bedtime.    [provider]  feeding supplement, ENSURE ENLIVE, (ENSURE ENLIVE) LIQD Take 237 mLs by mouth 2 (two) times daily between meals. Patient not taking: Reported on 03/23/2020 04/07/18   Debbe Odea, MD  guaiFENesin-dextromethorphan (ROBITUSSIN DM) 100-10 MG/5ML syrup Take 5 mLs by mouth every 4 (four) hours as needed for cough. 10/13/18   Arrien, Jimmy Picket, MD  insulin glargine (LANTUS SOLOSTAR) 100 UNIT/ML Solostar Pen Inject 4 Units into the skin daily. 08/27/20   Swayze, Ava, DO   ipratropium-albuterol (DUONEB) 0.5-2.5 (3) MG/3ML SOLN Take 3 mLs by nebulization every 6 (six) hours as needed (shortness of breath/wheezing).    [provider]  LORazepam (ATIVAN) 0.5 MG tablet Take 0.5 mg by mouth in the morning and at bedtime. Give at 4pm and at bedtime    [provider]  Maltodextrin-Xanthan Gum (RESOURCE THICKENUP CLEAR) POWD Take 1 g by mouth as needed. 08/27/20   Swayze, Ava, DO  mesalamine (LIALDA) 1.2 g EC tablet Take 2.4 g by mouth 2 (two) times daily.     [provider]  Multiple Vitamin (MULTIVITAMIN WITH MINERALS) TABS tablet Take 1 tablet by mouth daily. 08/28/20   Swayze, Ava, DO  nitroGLYCERIN (NITROSTAT) 0.4 MG SL tablet Place 0.4 mg under the tongue every 5 (five) minutes as needed for chest pain.     [provider]  nystatin (MYCOSTATIN) 100000 UNIT/ML suspension Take 5 mLs (500,000 Units total) by mouth 4 (four) times daily.  08/27/20   Swayze, Ava, DO  omeprazole (PRILOSEC) 40 MG capsule Take 40 mg by mouth at bedtime.    [provider]    Physical Exam: Vitals:   09/14/20 0000 09/14/20 0245 09/14/20 0300 09/14/20 0315  BP: 129/85  (!) 136/95 103/74  Pulse: (!) 133 (!) 126  (!) 129  Resp: (!) 29 (!) 26 (!) 24 (!) 23  Temp:      TempSrc:      SpO2: 95% 95%  97%     Constitutional: Lethargic but arousable, confused, in respiratory distress. Skin: no rashes, no lesions, good skin turgor noted. Eyes: Pupils are equally reactive to light.  No evidence of scleral icterus or conjunctival pallor.  ENMT: Dry mucous membranes noted.  Posterior pharynx clear of any exudate or lesions.   Neck: normal, supple, no masses, no thyromegaly.  No evidence of jugular venous distension.   Respiratory: Notable bibasilar rales with scattered rhonchi bilaterally.  No evidence of wheezing.  Patient is extremely tachypneic without evidence of accessory muscle use.  Cardiovascular: Tachycardic rate with regular rhythm, no murmurs  / rubs / gallops. No extremity edema. 2+ pedal pulses. No carotid bruits.  Chest:   Nontender without crepitus or deformity.   Back:   Nontender without crepitus or deformity. Abdomen: Abdomen is soft and nontender.  No evidence of intra-abdominal masses.  Positive bowel sounds noted in all quadrants.   Musculoskeletal: No joint deformity upper and lower extremities. Good ROM, no contractures. Normal muscle tone.  Neurologic: Patient lethargic but arousable, responsive to verbal and painful stimuli, spontaneously moving all 4 extremities. Psychiatric: Unable to perform psychiatric assessment due to mentation.  Patient currently does not seem to possess insight as to his current situation   Labs on Admission: I have personally reviewed following labs and imaging studies -   CBC: Recent Labs  Lab 09/17/2020 2058  WBC 15.7*  HGB 16.8  HCT 51.9  MCV 92.3  PLT 355   Basic Metabolic Panel: Recent Labs  Lab 09/07/2020 2058  NA 135  K 4.8  CL 96*  CO2 25  GLUCOSE 227*  BUN 14  CREATININE 0.83  CALCIUM 9.4   GFR: CrCl cannot be calculated (Unknown ideal weight.). Liver Function Tests: Recent Labs  Lab 09/12/2020 2058  AST 24  ALT 28  ALKPHOS 73  BILITOT 1.4*  PROT 7.1  ALBUMIN 3.0*   Recent Labs  Lab 09/04/2020 2058  LIPASE 37   No results for input(s): AMMONIA in the last 168 hours. Coagulation Profile: No results for input(s): INR, PROTIME in the last 168 hours. Cardiac Enzymes: No results for input(s): CKTOTAL, CKMB, CKMBINDEX, TROPONINI in the last 168 hours. BNP (last 3 results) No results for input(s): PROBNP in the last 8760 hours. HbA1C: No results for input(s): HGBA1C in the last 72 hours. CBG: No results for input(s): GLUCAP in the last 168 hours. Lipid Profile: No results for input(s): CHOL, HDL, LDLCALC, TRIG, CHOLHDL, LDLDIRECT in the last 72 hours. Thyroid Function Tests: No results for input(s): TSH, T4TOTAL, FREET4, T3FREE, THYROIDAB in the last 72  hours. Anemia Panel: No results for input(s): VITAMINB12, FOLATE, FERRITIN, TIBC, IRON, RETICCTPCT in the last 72 hours. Urine analysis:    Component Value Date/Time   COLORURINE YELLOW 03/23/2020 1507   APPEARANCEUR CLEAR 03/23/2020 1507   LABSPEC >1.046 (H) 03/23/2020 1507   PHURINE 6.0 03/23/2020 [place] 03/23/2020 1507   HGBUR NEGATIVE 03/23/2020 [place] 03/23/2020 1507  KETONESUR 5 (A) 03/23/2020 1507   PROTEINUR NEGATIVE 03/23/2020 1507   UROBILINOGEN 0.2 11/05/2014 0303   NITRITE NEGATIVE 03/23/2020 [place] 03/23/2020 1507    Radiological Exams on Admission - Personally Reviewed: DG Chest 2 View  Result Date: 09/15/2020 CLINICAL DATA:  Rib pain EXAM: CHEST - 2 VIEW COMPARISON:  08/19/2020 FINDINGS: Overall inspiratory effort is poor. Cardiac shadow is stable. Basilar atelectasis on the right is noted. No focal infiltrate or effusion is seen. No acute bony abnormality is noted. IMPRESSION: Right basilar atelectasis new from the prior exam. Electronically Signed   By: Inez Catalina M.D.   On: 09/15/2020 21:36   CT Angio Chest PE W and/or Wo Contrast  Result Date: 09/14/2020 CLINICAL DATA:  Chest and abdominal pain, rib pain EXAM: CT ANGIOGRAPHY CHEST CT ABDOMEN AND PELVIS WITH CONTRAST TECHNIQUE: Multidetector CT imaging of the chest was performed using the standard protocol during bolus administration of intravenous contrast. Multiplanar CT image reconstructions and MIPs were obtained to evaluate the vascular anatomy. Multidetector CT imaging of the abdomen and pelvis was performed using the standard protocol during bolus administration of intravenous contrast. CONTRAST:  168m OMNIPAQUE IOHEXOL 350 MG/ML SOLN COMPARISON:  CT abdomen and pelvis 03/23/2020.  CT chest 07/25/2020 FINDINGS: CTA CHEST FINDINGS Cardiovascular: There is good opacification of the central and segmental pulmonary arteries. Filling defects are  demonstrated in the distal right main pulmonary artery, extending into right upper lobe and right lower lobe branches. Changes are consistent with acute pulmonary embolus. Elevated RV to LV ratio at 1.7 suggesting right heart strain. No pericardial effusions. Normal caliber thoracic aorta. Calcification of the aorta and coronary arteries. Mediastinum/Nodes: Large left thyroid goiter extending into the retrosternal region, measuring about 4.3 cm maximal diameter. No change since previous study. Esophagus is decompressed. No significant lymphadenopathy. Lungs/Pleura: Consolidation and atelectasis in both lung bases, greater on the right. No pleural effusions. No pneumothorax. Airways are patent. Musculoskeletal: Degenerative changes in the spine. No destructive bone lesions. Compression of the T12 vertebra is chronic. Review of the MIP images confirms the above findings. CT ABDOMEN and PELVIS FINDINGS Hepatobiliary: Normal appearance of the liver. Gallbladder is distended. No stones or wall thickening. No bile duct dilatation. Pancreas: Unremarkable. No pancreatic ductal dilatation or surrounding inflammatory changes. Spleen: Normal in size without focal abnormality. Adrenals/Urinary Tract: No adrenal gland nodules. Scarring in the upper pole of the left kidney. Nephrograms are symmetrical. No solid mass lesions. No hydronephrosis or hydroureter. Bladder is normal. Stomach/Bowel: Stomach, small bowel, and colon are not abnormally distended. Scattered stool throughout the colon. No inflammatory changes or wall thickening. Appendix is normal. Vascular/Lymphatic: Aortic atherosclerosis. No enlarged abdominal or pelvic lymph nodes. Flat IVC may indicate hypovolemia. Hypodense filling defects in the right common femoral vein likely represents deep venous thrombosis. Reproductive: Prostate is unremarkable. Other: No free air or free fluid in the abdomen. Abdominal wall musculature appears intact. Musculoskeletal: Superior  endplate compression at L3 appears chronic. No destructive bone lesions. Degenerative changes in the spine and right hip. Previous left hip arthroplasty. Review of the MIP images confirms the above findings. IMPRESSION: 1. Positive for acute pulmonary embolus with CT evidence of right heart strain (RV/LV Ratio = 1.7) consistent with at least submassive (intermediate risk) PE. The presence of right heart strain has been associated with an increased risk of morbidity and mortality. 2. Consolidation and atelectasis in both lung bases, greater on the right. 3. Large left thyroid goiter extending into the retrosternal region.  4. No acute process demonstrated in the abdomen or pelvis. 5. Flat IVC may indicate hypovolemia. 6. Chronic compression fractures at T12 and L3. 7. Aortic atherosclerosis. 8. DVT seen in the right common femoral vein. Aortic Atherosclerosis (ICD10-I70.0). Critical Value/emergent results were called by telephone at the time of interpretation on 09/14/2020 at 1:06 am to provider Dr. Stark Jock, who verbally acknowledged these results. Electronically Signed   By: Lucienne Capers M.D.   On: 09/14/2020 01:12   CT ABDOMEN PELVIS W CONTRAST  Result Date: 09/14/2020 CLINICAL DATA:  Chest and abdominal pain, rib pain EXAM: CT ANGIOGRAPHY CHEST CT ABDOMEN AND PELVIS WITH CONTRAST TECHNIQUE: Multidetector CT imaging of the chest was performed using the standard protocol during bolus administration of intravenous contrast. Multiplanar CT image reconstructions and MIPs were obtained to evaluate the vascular anatomy. Multidetector CT imaging of the abdomen and pelvis was performed using the standard protocol during bolus administration of intravenous contrast. CONTRAST:  166m OMNIPAQUE IOHEXOL 350 MG/ML SOLN COMPARISON:  CT abdomen and pelvis 03/23/2020.  CT chest 07/25/2020 FINDINGS: CTA CHEST FINDINGS Cardiovascular: There is good opacification of the central and segmental pulmonary arteries. Filling defects  are demonstrated in the distal right main pulmonary artery, extending into right upper lobe and right lower lobe branches. Changes are consistent with acute pulmonary embolus. Elevated RV to LV ratio at 1.7 suggesting right heart strain. No pericardial effusions. Normal caliber thoracic aorta. Calcification of the aorta and coronary arteries. Mediastinum/Nodes: Large left thyroid goiter extending into the retrosternal region, measuring about 4.3 cm maximal diameter. No change since previous study. Esophagus is decompressed. No significant lymphadenopathy. Lungs/Pleura: Consolidation and atelectasis in both lung bases, greater on the right. No pleural effusions. No pneumothorax. Airways are patent. Musculoskeletal: Degenerative changes in the spine. No destructive bone lesions. Compression of the T12 vertebra is chronic. Review of the MIP images confirms the above findings. CT ABDOMEN and PELVIS FINDINGS Hepatobiliary: Normal appearance of the liver. Gallbladder is distended. No stones or wall thickening. No bile duct dilatation. Pancreas: Unremarkable. No pancreatic ductal dilatation or surrounding inflammatory changes. Spleen: Normal in size without focal abnormality. Adrenals/Urinary Tract: No adrenal gland nodules. Scarring in the upper pole of the left kidney. Nephrograms are symmetrical. No solid mass lesions. No hydronephrosis or hydroureter. Bladder is normal. Stomach/Bowel: Stomach, small bowel, and colon are not abnormally distended. Scattered stool throughout the colon. No inflammatory changes or wall thickening. Appendix is normal. Vascular/Lymphatic: Aortic atherosclerosis. No enlarged abdominal or pelvic lymph nodes. Flat IVC may indicate hypovolemia. Hypodense filling defects in the right common femoral vein likely represents deep venous thrombosis. Reproductive: Prostate is unremarkable. Other: No free air or free fluid in the abdomen. Abdominal wall musculature appears intact. Musculoskeletal:  Superior endplate compression at L3 appears chronic. No destructive bone lesions. Degenerative changes in the spine and right hip. Previous left hip arthroplasty. Review of the MIP images confirms the above findings. IMPRESSION: 1. Positive for acute pulmonary embolus with CT evidence of right heart strain (RV/LV Ratio = 1.7) consistent with at least submassive (intermediate risk) PE. The presence of right heart strain has been associated with an increased risk of morbidity and mortality. 2. Consolidation and atelectasis in both lung bases, greater on the right. 3. Large left thyroid goiter extending into the retrosternal region. 4. No acute process demonstrated in the abdomen or pelvis. 5. Flat IVC may indicate hypovolemia. 6. Chronic compression fractures at T12 and L3. 7. Aortic atherosclerosis. 8. DVT seen in the right common femoral vein. Aortic  Atherosclerosis (ICD10-I70.0). Critical Value/emergent results were called by telephone at the time of interpretation on 09/14/2020 at 1:06 am to provider Dr. Stark Jock, who verbally acknowledged these results. Electronically Signed   By: Lucienne Capers M.D.   On: 09/14/2020 01:12    EKG: Personally reviewed.  Rhythm is sinus tachycardia with heart rate of heart rate of 117 bpm.  No dynamic ST segment changes appreciated.  Assessment/Plan Principal Problem:   Acute pulmonary embolism ([place])   Patient presenting with respiratory distress and chest pain in the setting of suffering from COVID-19 infection several weeks ago  CT angiogram of the chest identifies submassive right-sided pulmonary embolism, notable in the right main pulmonary artery with extension into the right upper and lower lobes with evidence of right heart strain  Despite evidence of right heart strain on CT imaging, troponin is only minimally elevated, patient was not requiring oxygen on arrival and patient is currently hemodynamically stable making patient not a candidate for thrombolysis or  thrombectomy.  If patient clinically worsens, will reach out to PCCM to discuss options.  Patient has been initiated on intravenous heparin infusion  Providing supplemental oxygen for bouts of hypoxia  As needed opiate-based analgesics for substantial pleuritic chest pain  Admitting patient to progressive unit for close monitoring  Echocardiogram ordered for the morning to evaluate extent of right heart strain  Active Problems: Pneumonia of both lower lobes due to infectious organism   Patient additionally found to have persisting bilateral lower lobe infiltrates on both chest x-ray and CT imaging  Patient found to have substantial leukocytosis of 15.7  This may be persisting bilateral lower lobe pneumonia which was incompletely treated during hospitalization earlier this month.  Placing patient back on intravenous ceftriaxone and azithromycin  Gentle hydration with intravenous fluids  Blood cultures obtained    Chronic combined systolic and diastolic heart failure (HCC)   No clinical evidence of volume overload    Essential hypertension   Continue home regimen of antihypertensive therapy as blood pressure tolerates    Goals of care, counseling/discussion   I had a long discussion with the daughter at the bedside concerning goals of care for this patient  Daughter has confirmed the patient is currently a DNR  Daughter has also confirmed that she wishes for hospice to be temporarily revoked for the patient to be admitted and care to be provided for patient's acute pulmonary embolism  I informed the daughter that the patient's prognosis is tenuous at this point and that based on his clinical course over the next 12 to 24 hours, goals of care may need to be revisited in the possibility of comfort measures may need to be reconsidered.  Will place order for palliative care consultation to continue this conversation.    Dementia without behavioral disturbance  (HCC)   Minimize uncomfortable stimuli as much as possible  Encourage family to remain at bedside is much as possible  Continue home regimen of Aricept    Pleuritic chest pain   Patient presenting with substantial chest pain secondary to acute pulmonary emboli  Providing patient with apparent obesity analgesics for this substantial pain  Patient does exhibit slight elevation of troponin, unlikely to be secondary to plaque rupture    Elevated troponin level not due myocardial infarction   Slight elevation of troponin in the setting of acute pulmonary embolism without dynamic ST segment changes  Chest pain and elevated troponin are most likely pleuritic and not secondary to plaque rupture.  Monitoring patient on  telemetry    Type 2 diabetes mellitus without complication, with long-term current use of insulin (HCC)   Accu-Cheks before every meal and nightly with sliding scale insulin  Holding home regimen of scheduled Lantus therapy considering patient's tenuous oral intake    GERD without esophagitis    Continue home regimen of PPI   Code Status:  DNR Family Communication: Daughter is at bedside and has been updated on plan of care  Status is: Inpatient  Remains inpatient appropriate because:IV treatments appropriate due to intensity of illness or inability to take PO and Inpatient level of care appropriate due to severity of illness   Dispo: The patient is from: ALF              Anticipated d/c is to: ALF              Anticipated d/c date is: 3 days              Patient currently is not medically stable to d/c.        Vernelle Emerald MD [facility] Pager [phone]  If 7PM-7AM, please contact night-coverage [email or web address] Use universal [place] password for that web site. If you do not have the password, please call the hospital operator.  09/14/2020, 5:25 AM

## 2020-09-14 NOTE — ED Notes (Signed)
Breakfast ordered 

## 2020-09-14 NOTE — ED Notes (Signed)
Patient transported to CT 

## 2020-09-14 NOTE — Progress Notes (Addendum)
[facility] ED [facility])  Hospitalized Hospice Patient   Anthony [name] is a current hospice patient who resides at [facility]. He was admitted onto service on 08/28/20 with a terminal diagnosis of Alzheimer's Dementia. Pt has been c/o right-sided chest pain intermittently for several days and was seen twice on 09/06/2020 by his hospice case Physiological scientist. Daughters activated EMS due to increased concern about pain and SOB. ACC was made aware prior to transport.  Pt transported by EMS to [facility] ED for evaluation and found to have pulmonary embolus with right heart strain.  Per Dr. Tomasa Hosteller, [facility] MD, this is a related hospital admission.  Visited with patient and daughter Anthony [name] at bedside. Pt did not rouse to voice or gentle touch.  Respirations noted to be intermittently wet and tachypnic; some moaning noted.  Report exchanged with Luetta Nutting, NP with PMT, who had met with family at bedside.  Pt on full comfort care orders.  Anthony reports no PO intake since Thursday, decreased LOC today.  Residential hospice discussed. Anthony appreciative of this option, adamant that pt not return to [facility].  No bed at [facility] available at this time.   Update at 6:40pm:  At this time family is asking for pt to remain at [facility] for a hospital death. V/S: 100.3 axillary, 101/66, 115HR, 27RR, 95% 2L Diagnostics: CTA: IMPRESSION: 1. Positive for acute pulmonary embolus with CT evidence of right heart strain (RV/LV Ratio = 1.7) consistent with at least submassive (intermediate risk) PE. The presence of right heart strain has been associated with an increased risk of morbidity and mortality. 2. Consolidation and atelectasis in both lung bases, greater on the right. 3. Large left thyroid goiter extending into the retrosternal region. 4. No acute process demonstrated in the abdomen or pelvis. 5. Flat IVC may indicate hypovolemia. 6. Chronic compression fractures at T12 and L3. 7. Aortic atherosclerosis. 8. DVT  seen in the right common femoral vein. IVs/PRNs: morphine 31m PIV q3h; lorazepam 18mPIV PRN anxiety x 3 doses; azithromycin 500 mg PIV; rocephin 2g PIV; heparin 1100 units/hr PIV (dc'ed); LR 10071mr (dc'ed) Labs: Albumin 2.7, trop 22, CRP 37.6, WBC 18.8, NEUT# 16.1, Abs Immature Gran 0.32  Problem List: Comfort at End of Life: full comfort care orders in place, morphine 2mg67mhedule q3h for pain/dyspnea Pulmonary Embolus:   heparin drip discontinued at this time, full comfort care.  D/C plan: hospital death expected  GOC: comfort at end of life, DNR Family: visited with [place] IDT: hospice team updated  Thank you for the opportunity to participate in this patient's care.  ChriDomenic MorasN, RN [facility] [phone] on call)

## 2020-09-14 NOTE — Progress Notes (Signed)
ANTICOAGULATION CONSULT NOTE - Initial Consult   Pharmacy Consult for Heparin  Indication: pulmonary embolus  No Known Allergies   Vital Signs: Temp: 97.7 F (36.5 C) (10/22 2018) Temp Source: Axillary (10/22 2018) BP: 129/85 (10/23 0000) Pulse Rate: 133 (10/23 0000)  Labs: Recent Labs    09/17/2020 2058 08/24/2020 2222  HGB 16.8  --   HCT 51.9  --   PLT 225  --   CREATININE 0.83  --   TROPONINIHS 25* 22*    CrCl cannot be calculated (Unknown ideal weight.).   Medical History: Past Medical History:  Diagnosis Date  . Arthritis    rheumatoid-  Dr Barkley Boards  . CHF (congestive heart failure) (HCC)    chronic systolic heart failure per Dr [name] LOV note 09/23/11.   EKG 4/12, stress test  and cath from 8/12 on chart.  Clearance with LOV Dr [name] on chart  . Coronary artery disease   . Crohn disease ([place])   . Dementia ([place])   . Dysrhythmia    nonsustained,asymptomatic VT per Dr [name] office note  resulting in cath  . GERD (gastroesophageal reflux disease)   . Heart attack ([place]) 1996, 06/2011  . Hyperlipidemia   . Hypertension   . Pneumonia 1993  . Prostate troubles    states take alternating meds for bladder/prostate control- "age thing"  . Sleep apnea    sleep study years ago- has apnea but did not qualify for CPAP    Assessment: 86 y/o M with multiple medical issues, s/p COVID infection, now with new onset PE, CBC/renal function ok, PTA meds reviewed.   Goal of Therapy:  Heparin level 0.3-0.7 units/ml Monitor platelets by anticoagulation protocol: Yes   Plan:  Heparin 5000 units BOLUS Start heparin drip at 1100 units/hr 1030 heparin level Daily CBC/HL Monitor for bleeding  Narda Bonds, PharmD, BCPS Clinical Pharmacist Phone: [phone]

## 2020-09-14 NOTE — Progress Notes (Signed)
Patient admitted after midnight, please see H&P.  Discussed with daughter [name] at bedside- plan is to change to comfort focused-- will confirm with [facility] when she arrives.  As he is not eating think he would be appropriate for residential hospice like [facility].  Defer to palliative care who has already been consulted. JV

## 2020-09-14 NOTE — ED Provider Notes (Signed)
I assumed care of this patient.  Please see previous provider note for further details of Hx, PE.  Briefly patient is a 86 y.o. male who presented chest pain and shoulder pain. Noted to be tachycardic. Pending CTA to assess for PNA vs PE.  CTA consistent with right sided PE with evidence of right heart strain.  Patient was started on heparin drip.  Case discussed with hospitalist service who will admit the patient for further work-up and management.  Daughter at bedside and patient were updated on findings and plan.  Patient is currently hemodynamically stable, but remains tachycardic.  Satting well on his 2 L nasal cannula.  .Critical Care Performed by: Fatima Blank, MD Authorized by: Fatima Blank, MD     CRITICAL CARE Performed by: Grayce Sessions Dallyn Bergland Total critical care time: 30 minutes Critical care time was exclusive of separately billable procedures and treating other patients. Critical care was necessary to treat or prevent imminent or life-threatening deterioration. Critical care was time spent personally by me on the following activities: development of treatment plan with patient and/or surrogate as well as nursing, discussions with consultants, evaluation of patient's response to treatment, examination of patient, obtaining history from patient or surrogate, ordering and performing treatments and interventions, ordering and review of laboratory studies, ordering and review of radiographic studies, pulse oximetry and re-evaluation of patient's condition.        Fatima Blank, MD 09/14/20 [phone]

## 2020-09-14 NOTE — Care Management (Signed)
Discussed with pallative NP  Patient changed to comfort care, awaiting bed a [facility]. , this may be tomorrow.  Receiving rubinol for secretions.medications for comfort Family at bedside.

## 2020-09-15 DIAGNOSIS — Z66 Do not resuscitate: Secondary | ICD-10-CM

## 2020-09-15 DIAGNOSIS — I5042 Chronic combined systolic (congestive) and diastolic (congestive) heart failure: Secondary | ICD-10-CM | POA: Diagnosis not present

## 2020-09-15 DIAGNOSIS — I2609 Other pulmonary embolism with acute cor pulmonale: Secondary | ICD-10-CM | POA: Diagnosis not present

## 2020-09-15 DIAGNOSIS — Z789 Other specified health status: Secondary | ICD-10-CM

## 2020-09-15 DIAGNOSIS — Z7189 Other specified counseling: Secondary | ICD-10-CM | POA: Diagnosis not present

## 2020-09-15 DIAGNOSIS — F0390 Unspecified dementia without behavioral disturbance: Secondary | ICD-10-CM | POA: Diagnosis not present

## 2020-09-15 NOTE — Progress Notes (Signed)
Daily Progress Note   Patient Name: Anthony [name]       Date: 09/15/2020 DOB: 1934/07/05  Age: 86 y.o. MRN#: [id number] Attending Physician: Geradine Girt, DO Primary Care Physician: Josetta Huddle, MD Admit Date: 09/17/2020  Reason for Consultation/Follow-up: Non pain symptom management, Pain control, Psychosocial/spiritual support and Terminal Care  Subjective: Chart review performed. Received report from primary RN - no acute concerns. Patient developed fever overnight.  Went to visit patient at bedside - daughter/Anthony [name] was present. Patient was lying in bed - he did not wake to voice or gentle touch. No signs or non-verbal gestures of pain or discomfort noted. No respiratory distress, increased work of breathing, or secretions noted.   Emotional support and therapeutic listening provided to Anthony [name] as she reflected on sounds/activities/items that bring the patient joy. Anthony [name] states that current scheduled and PRN medications are keeping the patient comfortable and she has no concerns - patient seems comfortable during visit today.   Visit also consisted of discussions dealing with the complex and emotionally intense issues of symptom management and palliative care in the setting of serious and life-threatening illness - including discussion of fever as part of the body's natural transition at end of life and that we support with tylenol. Education also provided per Anthony [name]'s request on orders that were placed for bladder scan and possible need for foley catheter for EOL comfort - she expressed appreciation and understanding. Palliative care team will continue to support patient, patient's family, and medical team.  Questions and concerns were addressed. [name] was encouraged to call with questions  and/or concerns.    Length of Stay: 1  Current Medications: Scheduled Meds:  . antiseptic oral rinse  15 mL Topical BID  .  morphine injection  2 mg Intravenous Q3H    Continuous Infusions:   PRN Meds: acetaminophen **OR** acetaminophen, glycopyrrolate, haloperidol lactate, ipratropium-albuterol, LORazepam, morphine injection, ondansetron **OR** ondansetron (ZOFRAN) IV, polyvinyl alcohol  Physical Exam Vitals and nursing note reviewed.  Constitutional:      General: He is not in acute distress.    Appearance: He is ill-appearing.  Pulmonary:     Effort: No respiratory distress.  Skin:    General: Skin is warm and dry.  Neurological:     Mental Status: He is lethargic.     Motor:  Weakness present.  Psychiatric:        Speech: He is noncommunicative.        Cognition and Memory: Cognition is impaired. Memory is impaired.             Vital Signs: BP 117/75 (BP Location: Left Arm)   Pulse (!) 131   Temp (!) 103 F (39.4 C) (Oral)   Resp 17   SpO2 95%  SpO2: SpO2: 95 % O2 Device: O2 Device: Nasal Cannula O2 Flow Rate: O2 Flow Rate (L/min): 3 L/min  Intake/output summary:   Intake/Output Summary (Last 24 hours) at 09/15/2020 1041 Last data filed at 09/15/2020 3833 Gross per 24 hour  Intake 1179.18 ml  Output 50 ml  Net 1129.18 ml   LBM:   Baseline Weight:   Most recent weight:         Palliative Assessment/Data: PPS 10%    Flowsheet Rows     Most Recent Value  Intake Tab  Referral Department Hospitalist  Unit at Time of Referral ER  Palliative Care Primary Diagnosis Pulmonary  Date Notified 09/14/20  Palliative Care Type Return patient Palliative Care  Reason for referral Clarify Goals of Care, End of Life Care Assistance  Date of Admission 09/14/20  Date first seen by Palliative Care 09/14/20  # of days Palliative referral response time 0 Day(s)  # of days IP prior to Palliative referral 0  Clinical Assessment  Psychosocial & Spiritual  Assessment  Palliative Care Outcomes  Patient/Family meeting held? Yes  Who was at the meeting? daughter  Palliative Care Outcomes Improved pain interventions, Improved non-pain symptom therapy, Clarified goals of care, Counseled regarding hospice, Provided end of life care assistance, Provided psychosocial or spiritual support, Changed to focus on comfort, Transitioned to hospice  Patient/Family wishes: Interventions discontinued/not started  Antibiotics, Tube feedings/TPN, BiPAP, Transfusion, Vasopressors, Transfer out of ICU, PEG, Mechanical Ventilation      Patient Active Problem List   Diagnosis Date Noted  . Acute pulmonary embolism ([place]) 09/14/2020  . Dementia without behavioral disturbance ([place]) 09/14/2020  . Pleuritic chest pain 09/14/2020  . Elevated troponin level not due myocardial infarction 09/14/2020  . Type 2 diabetes mellitus without complication, with long-term current use of insulin ([place]) 09/14/2020  . Pulmonary emboli ([place]) 09/14/2020  . Terminal care   . Encounter for hospice care discussion   . Terminal respiratory secretions   . Palliative care by specialist   . Goals of care, counseling/discussion   . Acute metabolic encephalopathy 38/32/9191  . Failure to thrive in adult 08/20/2020  . Unspecified protein-calorie malnutrition ([place]) 08/20/2020  . Pneumonia of both lower lobes due to infectious organism 08/19/2020  . Acute respiratory disease due to COVID-19 virus 07/25/2020  . Hypokalemia 03/23/2020  . S/P hip hemiarthroplasty 04/19/2019  . Pressure injury of skin 03/05/2019  . Suspected COVID-19 virus infection 03/01/2019  . Hip fracture ([place]) 03/01/2019  . Fall 03/01/2019  . Displaced fracture of base of neck of left femur, initial encounter for closed fracture ([place])   . Fever   . Nonspecific interstitial pneumonia ([place])   . Febrile illness   . Thrush   . Hypoxia   . Sepsis ([place]) 10/04/2018  . Fatty liver 04/06/2018  . Acute pancreatitis 04/04/2018    . Abdominal pain, right lower quadrant 05/28/2015  . Absolute anemia 05/28/2015  . Aortic atherosclerosis ([place]) 05/28/2015  . Altered blood in stool 05/28/2015  . Chronic systolic heart failure ([place]) 05/28/2015  . Syncope and collapse 05/28/2015  .  Arteriosclerosis of coronary artery 05/28/2015  . Cough 05/28/2015  . Crohn's disease of large bowel ([place]) 05/28/2015  . Diarrhea 05/28/2015  . Displacement of cervical intervertebral disc without myelopathy 05/28/2015  . Benign essential HTN 05/28/2015  . Adenopathy 05/28/2015  . Hypercholesterolemia without hypertriglyceridemia 05/28/2015  . Hypo-osmolality and hyponatremia 05/28/2015  . Postinflammatory pulmonary fibrosis ([place]) 05/28/2015  . Healed myocardial infarct 05/28/2015  . Peptic esophagitis 05/28/2015  . 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 Paroxysmal ventricular tachycardia ([place]) 05/28/2015  . Decreased body weight 05/28/2015  . Weakness 11/05/2014  . Nausea with vomiting 11/05/2014  . Generalized weakness   . Dehydration   . Difficulty walking   . Pulmonary infiltrates 07/16/2014  . Chronic combined systolic and diastolic heart failure ([place]) 05/31/2014  . Essential hypertension 05/31/2014  .  H/O Right inguinal hernia repair 01/15/2012  . Small fat containing right inguinal hernia that has been painful 08/05/2011  . Finger wound, simple, open 10/12/2008  . Benign prostatic hyperplasia without urinary obstruction 04/30/2008  . Synovitis and tenosynovitis 04/30/2008  . HLD (hyperlipidemia) 04/12/2008  . COLONIC POLYPS 11/21/2007  . MI 11/21/2007  . Coronary atherosclerosis 11/21/2007  . GERD without esophagitis 11/21/2007  . Rheumatoid arthritis ([place]) 11/21/2007  . SLEEP APNEA, MILD 11/21/2007    Palliative Care Assessment & Plan   Patient Profile: 86 y.o. male  with past medical history of hypertension, CHF, hyperlipidemia, dementia, GERD, Crohn's disease, diabetes mellitus type 2 presented to the ED on 09/14/20 from  [facility] due to family concerns for chest pain and shortness of breath. Patient recently had COVID19 infection on 07/25/20 and was also recently hospitalized at [facility] from 9/27-10/5/21 for pneumonia. He was discharged on 08/27/20 back to [facility] with home hospice services with [employer].  Patient's daughters initially revoked hospice services in ED to allow work up and treatment. Patient was found to have acute PE and bilateral pneumonia. Family allowed a time limited trial of treatment with watchful waiting and patient showed no improvements.   Assessment: Acute pulmonary embolism Systolic and diastolic heart failure Hypertension Pneumonia to bilateral lower lobes Dementia Type 2 diabetes mellitus Terminal care  Recommendations/Plan:  Continue full comfort care  Continue DNR/DNI as previously documented  Family no longer wants transfer to [employer] - anticipate hospital death, likely hours to days  Continue current scheduled and PRN end of life medication regimen   PMT will continue to follow holistically  Goals of Care and Additional Recommendations:  Limitations on Scope of Treatment: Full Comfort Care  Code Status:    Code Status Orders  (From admission, onward)         Start     Ordered   09/14/20 1152  Do not attempt resuscitation (DNR)  Continuous       Question Answer Comment  In the event of cardiac or respiratory ARREST Do not call a "code blue"   In the event of cardiac or respiratory ARREST Do not perform Intubation, CPR, defibrillation or ACLS   In the event of cardiac or respiratory ARREST Use medication by any route, position, wound care, and other measures to relive pain and suffering. May use oxygen, suction and manual treatment of airway obstruction as needed for comfort.   Comments MOST form in chart      09/14/20 1205        Code Status History    Date Active Date Inactive Code Status Order ID Comments User Context    09/14/2020 0412 09/14/2020 1205 DNR [id number]  Vernelle Emerald, MD ED   09/14/2020 [phone] 09/14/2020 0412 DNR [id number]  Vernelle Emerald, MD ED   08/23/2020 1437 08/27/2020 1754 DNR [id number]  Rosezella Rumpf, NP Inpatient   07/25/2020 2237 07/29/2020 1721 DNR [id number]  Rise Patience, MD ED   03/23/2020 1725 03/25/2020 1950 DNR [id number]  Norval Morton, MD ED   03/01/2019 0355 03/08/2019 1839 DNR [id number]  Shela Leff, MD ED   10/06/2018 1446 10/13/2018 1842 DNR [id number]  Asencion Gowda, NP Inpatient   10/04/2018 2003 10/06/2018 1446 Full Code [id number]  Merton Border, MD ED   04/04/2018 2331 04/07/2018 1803 Full Code [id number]  Bennie Pierini, MD ED   11/05/2014 0541 11/06/2014 1550 Full Code [id number]  Rise Patience, MD Inpatient   Advance Care Planning Activity    Advance Directive Documentation     Most Recent Value  Type of Advance Directive Out of facility DNR (pink MOST or yellow form)  Pre-existing out of facility DNR order (yellow form or pink MOST form) --  "MOST" Form in Place? --       Prognosis:   Hours - Days  Discharge Planning:  Anticipated Hospital Death  Care plan was discussed with primary RN, daughter/Anthony [name]  Thank you for allowing the Palliative Medicine Team to assist in the care of this patient.   Total Time 30 minutes Prolonged Time Billed  no       Greater than 50%  of this time was spent counseling and coordinating care related to the above assessment and plan.  Lin Landsman, NP  Please contact Palliative Medicine Team phone at [phone] for questions and concerns.

## 2020-09-15 NOTE — Progress Notes (Signed)
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 He was admitted onto service on 08/28/20 with a terminal diagnosis of Alzheimer's Dementia. Pt has been c/o right-sided chest pain intermittently for several days and was seen twice on 09/15/2020 by his hospice case Physiological scientist. Daughters activated EMS due to increased concern about pain and SOB. [facility] was made aware prior to transport. Pt transported by EMS to [facility] ED for evaluation and found to have pulmonary embolus with right heart strain. Per Dr. Tomasa Hosteller, [facility] MD, this is a related hospital admission.  Visited patient at bedside with daughter present. Patient was sleeping and appeared comfortable. He did not arouse to my voice. Elevated temp this morning.  Bladder scan and possible need for foley catheter for EOL comfort. Family has decided they do not want to move him as this creates more discomfort for the patient. Anticpated hospital death.   VS: 103 oral, 117/75, 131, 17, 96% on 3Lnc I/O: 1179/50  No new labs.   Medications: morphine 82m Q3hrs IV, Tylenol suppository 6578mPRN @ 1254, Ativan 38m66mV PRN @ [id number],6060,0459roblem List: Comfort at End of Life: full comfort care orders in place, morphine 2mg39mhedule q3h for pain/dyspnea Pulmonary Embolus:  heparin drip discontinued at this time, full comfort care.  D/C plan: hospital death expected  GOC: comfort at end of life, DNR Family: visited with [place] IDT: hospice team updated  Anthony [name], CCM       [facility] [place] [facility]     [phone]

## 2020-09-15 NOTE — Progress Notes (Signed)
Patient's daughter [name] would like her father to continue to be relaxed and comfortable and for [place] to hold off on doing bladder scan at this time.

## 2020-09-15 NOTE — Progress Notes (Signed)
Progress Note    [name]  JOI:[id number] DOB: [birth year]  DOA: 08/27/2020 PCP: Josetta Huddle, MD    Brief Narrative:     Medical records reviewed and are as summarized below:  Anthony [name] is an 86 y.o. male with past medical history of rheumatoid arthritis, systolic and diastolic congestive heart failure (Echo 03/2020 EF 45-50%), hyperlipidemia, Crohn's disease, dementia, gastroesophageal reflux disease, diabetes mellitus type 2 who presents to [facility] emergency department via EMS from [facility] assisted-living facility due to family concerns for chest pain and shortness of breath.  Of note, patient was recently hospitalized at [facility] from 9/27-10/5 for bilateral lower lobe infiltrates with leukocytosis concerning for pneumonia.  Patient was eventually discharged on 10/5 back to [facility] with home health hospice services. Found to have PE.  Has been transitioned to comfort care on 10/23.   Assessment/Plan:   Principal Problem:   Acute pulmonary embolism (HCC) Active Problems:   GERD without esophagitis   Chronic combined systolic and diastolic heart failure (HCC)   Essential hypertension   Pneumonia of both lower lobes due to infectious organism   Goals of care, counseling/discussion   Dementia without behavioral disturbance (HCC)   Pleuritic chest pain   Elevated troponin level not due myocardial infarction   Type 2 diabetes mellitus without complication, with long-term current use of insulin (HCC)   Pulmonary emboli (HCC)      Acute pulmonary embolism (HCC) in light of recent COVID 19 infection  CT angiogram of the chest identifies submassive right-sided pulmonary embolism, notable in the right main pulmonary artery with extension into the right upper and lower lobes with evidence of right heart strain  Transition to comfort measures   Pneumonia of both lower lobes due to infectious organism  transition to comfort  measures   I spoke with daughter in the AM of 10/23 and he was changed to comfort focused.  Palliative care also met with family and they do not wish for transfer to residential hospice.  Plan is to remain in hospital for comfort focused care and in-hospital death.   Family Communication/Anticipated D/C date and plan/Code Status   Daughter [name] at bedside       Medical Consultants:    Palliative care  Subjective:   In bed, appears comfortable  Objective:    Vitals:   09/14/20 1620 09/14/20 1628 09/14/20 1703 09/15/20 0525  BP:   111/71 117/75  Pulse: (!) 121  (!) 123 (!) 131  Resp: (!) _0 Temp:  99 F (37.2 C) 99.3 F (37.4 C) (!) 103 F (39.4 C)  TempSrc:  Oral Axillary Oral  SpO2: 92%  95% 95%    Intake/Output Summary (Last 24 hours) at 09/15/2020 0919 Last data filed at 09/15/2020 0525 Gross per 24 hour  Intake 1179.18 ml  Output 50 ml  Net 1129.18 ml   There were no vitals filed for this visit.  Exam: In bed, comfortable appearing Snoring even respirations  Data Reviewed:   I have personally reviewed following labs and imaging studies:  Labs: Labs show the following:   Basic Metabolic Panel: Recent Labs  Lab 08/24/2020 2058 09/14/20 0512  NA 135 136  K 4.8 4.5  CL 96* 98  CO2 25 24  GLUCOSE 227* 217*  BUN 14 15  CREATININE 0.83 0.96  CALCIUM 9.4 9.4  MG  --  1.8   GFR CrCl cannot be calculated (Unknown ideal weight.). Liver  Function Tests: Recent Labs  Lab 09/14/2020 2058 09/14/20 0512  AST 24 14*  ALT 28 26  ALKPHOS 73 67  BILITOT 1.4* 1.4*  PROT 7.1 6.3*  ALBUMIN 3.0* 2.7*   Recent Labs  Lab 09/19/2020 2058  LIPASE 37   No results for input(s): AMMONIA in the last 168 hours. Coagulation profile No results for input(s): INR, PROTIME in the last 168 hours.  CBC: Recent Labs  Lab 09/07/2020 2058 09/14/20 0512  WBC 15.7* 18.8*  NEUTROABS  --  16.1*  HGB 16.8 14.8  HCT 51.9 45.5  MCV 92.3 92.5  PLT 225 245    Cardiac Enzymes: No results for input(s): CKTOTAL, CKMB, CKMBINDEX, TROPONINI in the last 168 hours. BNP (last 3 results) No results for input(s): PROBNP in the last 8760 hours. CBG: Recent Labs  Lab 09/14/20 0740 09/14/20 1330 09/14/20 1709  GLUCAP 211* 164* 154*   D-Dimer: Recent Labs    09/03/2020 2058  DDIMER 1.68*   Hgb A1c: No results for input(s): HGBA1C in the last 72 hours. Lipid Profile: No results for input(s): CHOL, HDL, LDLCALC, TRIG, CHOLHDL, LDLDIRECT in the last 72 hours. Thyroid function studies: No results for input(s): TSH, T4TOTAL, T3FREE, THYROIDAB in the last 72 hours.  Invalid input(s): FREET3 Anemia work up: No results for input(s): VITAMINB12, FOLATE, FERRITIN, TIBC, IRON, RETICCTPCT in the last 72 hours. Sepsis Labs: Recent Labs  Lab 08/27/2020 2058 09/14/20 0512  PROCALCITON  --  0.31  WBC 15.7* 18.8*    Microbiology No results found for this or any previous visit (from the past 240 hour(s)).  Procedures and diagnostic studies:  DG Chest 2 View  Result Date: 08/28/2020 CLINICAL DATA:  Rib pain EXAM: CHEST - 2 VIEW COMPARISON:  08/19/2020 FINDINGS: Overall inspiratory effort is poor. Cardiac shadow is stable. Basilar atelectasis on the right is noted. No focal infiltrate or effusion is seen. No acute bony abnormality is noted. IMPRESSION: Right basilar atelectasis new from the prior exam. Electronically Signed   By: Inez Catalina M.D.   On: 08/27/2020 21:36   CT Angio Chest PE W and/or Wo Contrast  Result Date: 09/14/2020 CLINICAL DATA:  Chest and abdominal pain, rib pain EXAM: CT ANGIOGRAPHY CHEST CT ABDOMEN AND PELVIS WITH CONTRAST TECHNIQUE: Multidetector CT imaging of the chest was performed using the standard protocol during bolus administration of intravenous contrast. Multiplanar CT image reconstructions and MIPs were obtained to evaluate the vascular anatomy. Multidetector CT imaging of the abdomen and pelvis was performed using the  standard protocol during bolus administration of intravenous contrast. CONTRAST:  153m OMNIPAQUE IOHEXOL 350 MG/ML SOLN COMPARISON:  CT abdomen and pelvis 03/23/2020.  CT chest 07/25/2020 FINDINGS: CTA CHEST FINDINGS Cardiovascular: There is good opacification of the central and segmental pulmonary arteries. Filling defects are demonstrated in the distal right main pulmonary artery, extending into right upper lobe and right lower lobe branches. Changes are consistent with acute pulmonary embolus. Elevated RV to LV ratio at 1.7 suggesting right heart strain. No pericardial effusions. Normal caliber thoracic aorta. Calcification of the aorta and coronary arteries. Mediastinum/Nodes: Large left thyroid goiter extending into the retrosternal region, measuring about 4.3 cm maximal diameter. No change since previous study. Esophagus is decompressed. No significant lymphadenopathy. Lungs/Pleura: Consolidation and atelectasis in both lung bases, greater on the right. No pleural effusions. No pneumothorax. Airways are patent. Musculoskeletal: Degenerative changes in the spine. No destructive bone lesions. Compression of the T12 vertebra is chronic. Review of the MIP images confirms the above  findings. CT ABDOMEN and PELVIS FINDINGS Hepatobiliary: Normal appearance of the liver. Gallbladder is distended. No stones or wall thickening. No bile duct dilatation. Pancreas: Unremarkable. No pancreatic ductal dilatation or surrounding inflammatory changes. Spleen: Normal in size without focal abnormality. Adrenals/Urinary Tract: No adrenal gland nodules. Scarring in the upper pole of the left kidney. Nephrograms are symmetrical. No solid mass lesions. No hydronephrosis or hydroureter. Bladder is normal. Stomach/Bowel: Stomach, small bowel, and colon are not abnormally distended. Scattered stool throughout the colon. No inflammatory changes or wall thickening. Appendix is normal. Vascular/Lymphatic: Aortic atherosclerosis. No  enlarged abdominal or pelvic lymph nodes. Flat IVC may indicate hypovolemia. Hypodense filling defects in the right common femoral vein likely represents deep venous thrombosis. Reproductive: Prostate is unremarkable. Other: No free air or free fluid in the abdomen. Abdominal wall musculature appears intact. Musculoskeletal: Superior endplate compression at L3 appears chronic. No destructive bone lesions. Degenerative changes in the spine and right hip. Previous left hip arthroplasty. Review of the MIP images confirms the above findings. IMPRESSION: 1. Positive for acute pulmonary embolus with CT evidence of right heart strain (RV/LV Ratio = 1.7) consistent with at least submassive (intermediate risk) PE. The presence of right heart strain has been associated with an increased risk of morbidity and mortality. 2. Consolidation and atelectasis in both lung bases, greater on the right. 3. Large left thyroid goiter extending into the retrosternal region. 4. No acute process demonstrated in the abdomen or pelvis. 5. Flat IVC may indicate hypovolemia. 6. Chronic compression fractures at T12 and L3. 7. Aortic atherosclerosis. 8. DVT seen in the right common femoral vein. Aortic Atherosclerosis (ICD10-I70.0). Critical Value/emergent results were called by telephone at the time of interpretation on 09/14/2020 at 1:06 am to provider Dr. Stark Jock, who verbally acknowledged these results. Electronically Signed   By: Lucienne Capers M.D.   On: 09/14/2020 01:12   CT ABDOMEN PELVIS W CONTRAST  Result Date: 09/14/2020 CLINICAL DATA:  Chest and abdominal pain, rib pain EXAM: CT ANGIOGRAPHY CHEST CT ABDOMEN AND PELVIS WITH CONTRAST TECHNIQUE: Multidetector CT imaging of the chest was performed using the standard protocol during bolus administration of intravenous contrast. Multiplanar CT image reconstructions and MIPs were obtained to evaluate the vascular anatomy. Multidetector CT imaging of the abdomen and pelvis was performed  using the standard protocol during bolus administration of intravenous contrast. CONTRAST:  154m OMNIPAQUE IOHEXOL 350 MG/ML SOLN COMPARISON:  CT abdomen and pelvis 03/23/2020.  CT chest 07/25/2020 FINDINGS: CTA CHEST FINDINGS Cardiovascular: There is good opacification of the central and segmental pulmonary arteries. Filling defects are demonstrated in the distal right main pulmonary artery, extending into right upper lobe and right lower lobe branches. Changes are consistent with acute pulmonary embolus. Elevated RV to LV ratio at 1.7 suggesting right heart strain. No pericardial effusions. Normal caliber thoracic aorta. Calcification of the aorta and coronary arteries. Mediastinum/Nodes: Large left thyroid goiter extending into the retrosternal region, measuring about 4.3 cm maximal diameter. No change since previous study. Esophagus is decompressed. No significant lymphadenopathy. Lungs/Pleura: Consolidation and atelectasis in both lung bases, greater on the right. No pleural effusions. No pneumothorax. Airways are patent. Musculoskeletal: Degenerative changes in the spine. No destructive bone lesions. Compression of the T12 vertebra is chronic. Review of the MIP images confirms the above findings. CT ABDOMEN and PELVIS FINDINGS Hepatobiliary: Normal appearance of the liver. Gallbladder is distended. No stones or wall thickening. No bile duct dilatation. Pancreas: Unremarkable. No pancreatic ductal dilatation or surrounding inflammatory changes. Spleen: Normal in  size without focal abnormality. Adrenals/Urinary Tract: No adrenal gland nodules. Scarring in the upper pole of the left kidney. Nephrograms are symmetrical. No solid mass lesions. No hydronephrosis or hydroureter. Bladder is normal. Stomach/Bowel: Stomach, small bowel, and colon are not abnormally distended. Scattered stool throughout the colon. No inflammatory changes or wall thickening. Appendix is normal. Vascular/Lymphatic: Aortic atherosclerosis.  No enlarged abdominal or pelvic lymph nodes. Flat IVC may indicate hypovolemia. Hypodense filling defects in the right common femoral vein likely represents deep venous thrombosis. Reproductive: Prostate is unremarkable. Other: No free air or free fluid in the abdomen. Abdominal wall musculature appears intact. Musculoskeletal: Superior endplate compression at L3 appears chronic. No destructive bone lesions. Degenerative changes in the spine and right hip. Previous left hip arthroplasty. Review of the MIP images confirms the above findings. IMPRESSION: 1. Positive for acute pulmonary embolus with CT evidence of right heart strain (RV/LV Ratio = 1.7) consistent with at least submassive (intermediate risk) PE. The presence of right heart strain has been associated with an increased risk of morbidity and mortality. 2. Consolidation and atelectasis in both lung bases, greater on the right. 3. Large left thyroid goiter extending into the retrosternal region. 4. No acute process demonstrated in the abdomen or pelvis. 5. Flat IVC may indicate hypovolemia. 6. Chronic compression fractures at T12 and L3. 7. Aortic atherosclerosis. 8. DVT seen in the right common femoral vein. Aortic Atherosclerosis (ICD10-I70.0). Critical Value/emergent results were called by telephone at the time of interpretation on 09/14/2020 at 1:06 am to provider Dr. Stark Jock, who verbally acknowledged these results. Electronically Signed   By: Lucienne Capers M.D.   On: 09/14/2020 01:12    Medications:   . antiseptic oral rinse  15 mL Topical BID  .  morphine injection  2 mg Intravenous Q3H   Continuous Infusions:   LOS: 1 day   Geradine Girt  [facility]   How to contact the [facility] Attending or Consulting provider [place] or covering provider during after hours [place], for this patient?  1. Check the care team in [facility] and look for a) attending/consulting [facility] provider listed and b) the [facility] team listed 2. Log into [email or web address] and  use [place]'s universal password to access. If you do not have the password, please contact the hospital operator. 3. Locate the [facility] provider you are looking for under [facility] and page to a number that you can be directly reached. 4. If you still have difficulty reaching the provider, please page the [facility] (Director on Call) for the Hospitalists listed on amion for assistance.  09/15/2020, 9:19 AM

## 2020-09-16 DIAGNOSIS — F0390 Unspecified dementia without behavioral disturbance: Secondary | ICD-10-CM | POA: Diagnosis not present

## 2020-09-16 DIAGNOSIS — Z515 Encounter for palliative care: Secondary | ICD-10-CM

## 2020-09-16 DIAGNOSIS — Z66 Do not resuscitate: Secondary | ICD-10-CM

## 2020-09-16 DIAGNOSIS — I2609 Other pulmonary embolism with acute cor pulmonale: Secondary | ICD-10-CM | POA: Diagnosis not present

## 2020-09-16 NOTE — Plan of Care (Signed)
  Problem: Education: Goal: Knowledge of the prescribed therapeutic regimen will improve Outcome: Progressing   

## 2020-09-16 NOTE — Progress Notes (Signed)
Daily Progress Note   Patient Name: Anthony [name]       Date: 09/16/2020 DOB: 08-22-1934  Age: 86 y.o. MRN#: [id number] Attending Physician: Geradine Girt, DO Primary Care Physician: Josetta Huddle, MD Admit Date: 08/31/2020  Reason for Consultation/Follow-up: end of life care, symptom check  Subjective: Patient appears comfortable - respirations are even and unlabored. Daughter [name] is at bedside. [name] expresses at length her dissatisfaction with hospice services as well as the care her father received at [facility]. Emotional support and therapeutic listening was provided.   Visit also included discussion and counseling on expectations at EOL. I provided [name] with a copy of "Gone from my sight" to provide additional education on the dying process.  During my visit, the bedside RN checks his temperature (it is 101.5).  I assist her with repositioning the patient while she administers a tylenol suppository.  I provided reassurance to [name] that the palliative care team will continue to support the patient and family. [name] expresses appreciation to PMT and the medical team for the care provided to her father.    Length of Stay: 2  Current Medications: Scheduled Meds:  . antiseptic oral rinse  15 mL Topical BID  .  morphine injection  2 mg Intravenous Q3H    Continuous Infusions:   PRN Meds: acetaminophen **OR** acetaminophen, glycopyrrolate, haloperidol lactate, ipratropium-albuterol, LORazepam, morphine injection, ondansetron **OR** ondansetron (ZOFRAN) IV, polyvinyl alcohol  Physical Exam Vitals reviewed.  Constitutional:      General: He is not in acute distress. Pulmonary:     Effort: Pulmonary effort is normal.     Comments: Respirations even and unlabored Skin:     General: Skin is warm and dry.  Neurological:     Comments: Does not respond to voice or light touch             Vital Signs: BP 116/76 (BP Location: Left Arm)   Pulse (!) 138   Temp (!) 101.5 F (38.6 C) (Axillary)   Resp (!) 22   SpO2 95%  SpO2: SpO2: 95 % O2 Device: O2 Device: Nasal Cannula O2 Flow Rate: O2 Flow Rate (L/min): 3 L/min  Intake/output summary:   Intake/Output Summary (Last 24 hours) at 09/16/2020 1331 Last data filed at 09/16/2020 0108 Gross per 24 hour  Intake 0 ml  Output 400 ml  Net -400 ml        Palliative Assessment/Data: PPS 10%      Palliative Care Assessment & Plan   Patient Profile: 86 y.o.malewith past medical history of hypertension, CHF, hyperlipidemia, dementia, GERD, Crohn's disease, diabetes mellitus type 2 presented to the ED on 09/14/20 from [facility] due to family concerns for chest pain and shortness of breath. Patient recently had COVID19 infection on 07/25/20 and was also recently hospitalized at [facility] from 9/27-10/5/21 for pneumonia. He was discharged on 08/27/20 back to [facility] with home hospice services with [facility].  Patient's daughters initially revoked hospice services in ED to allow work up and treatment.Patient was found to have acute PE and bilateral pneumonia.Family allowed a time limited trial of treatment withwatchful waiting and patient showed no improvements.   Assessment: Acute pulmonary embolism Systolic and diastolic heart failure Hypertension Pneumonia to bilateral lower lobes Dementia Type 2 diabetes mellitus Terminal care  Recommendations/Plan:  Continue full comfort care  Continue symptom management utilizing prn medications as ordered  Family declines transfer to [facility] has expressed their wish that hospice not be involved in patient's care  PMT will continue to follow holistically   Goals of Care and Additional Recommendations:  Limitations on  Scope of Treatment: Full Comfort Care  Code Status: DNR/DNI  Prognosis:   Hours - Days  Discharge Planning:  Anticipated Hospital Death  Care plan was discussed with bedside RN  Thank you for allowing the Palliative Medicine Team to assist in the care of this patient.   Total Time 35 minutes Prolonged Time Billed  no       Greater than 50%  of this time was spent counseling and coordinating care related to the above assessment and plan.  Lavena Bullion, NP  Please contact Palliative Medicine Team phone at [phone] for questions and concerns.

## 2020-09-16 NOTE — Progress Notes (Signed)
Progress Note    Anthony [name]  OZH:[id number] DOB: 10-09-1934  DOA: 09/03/2020 PCP: Anthony Huddle, MD    Brief Narrative:     Medical records reviewed and are as summarized below:  Anthony [name] is an 86 y.o. male with past medical history of rheumatoid arthritis, systolic and diastolic congestive heart failure (Echo 03/2020 EF 45-50%), hyperlipidemia, Crohn's disease, dementia, gastroesophageal reflux disease, diabetes mellitus type 2 who presents to [facility] emergency department via EMS from [facility] assisted-living facility due to family concerns for chest pain and shortness of breath.  Of note, patient was recently hospitalized at [facility] from 9/27-10/5 for bilateral lower lobe infiltrates with leukocytosis concerning for pneumonia.  Patient was eventually discharged on 10/5 back to [facility] with home health hospice services. Found to have PE.  Has been transitioned to comfort care on 10/23.   Assessment/Plan:   Principal Problem:   Acute pulmonary embolism (HCC) Active Problems:   GERD without esophagitis   Chronic combined systolic and diastolic heart failure (HCC)   Essential hypertension   Pneumonia of both lower lobes due to infectious organism   Goals of care, counseling/discussion   Dementia without behavioral disturbance (HCC)   Pleuritic chest pain   Elevated troponin level not due myocardial infarction   Type 2 diabetes mellitus without complication, with long-term current use of insulin (HCC)   Pulmonary emboli (HCC)   DNR (do not resuscitate)   DNI (do not intubate)      Acute pulmonary embolism ([place]) in light of recent COVID 19 infection  CT angiogram of the chest identifies submassive right-sided pulmonary embolism, notable in the right main pulmonary artery with extension into the right upper and lower lobes with evidence of right heart strain  Transition to comfort measures   Pneumonia of both lower lobes  due to infectious organism  transition to comfort measures  Comfort focused, anticipate an in hospital death   Family Communication/Anticipated D/C date and plan/Code Status   Daughter Anthony [name] at bedside       Medical Consultants:    Palliative care  Subjective:   Fever again this AM  Objective:    Vitals:   09/14/20 1628 09/14/20 1703 09/15/20 0525 09/16/20 0453  BP:  111/71 117/75 116/76  Pulse:  (!) 123 (!) 131 (!) 138  Resp:  20 17 (!) 22  Temp: 99 F (37.2 C) 99.3 F (37.4 C) (!) 103 F (39.4 C) (!) 103 F (39.4 C)  TempSrc: Oral Axillary Oral   SpO2:  95% 95% 95%    Intake/Output Summary (Last 24 hours) at 09/16/2020 0854 Last data filed at 09/16/2020 0108 Gross per 24 hour  Intake 0 ml  Output 550 ml  Net -550 ml   There were no vitals filed for this visit.  Exam: In bed, comfortable appearing Snoring respirations tachycardic  Data Reviewed:   I have personally reviewed following labs and imaging studies:  Labs: Labs show the following:   Basic Metabolic Panel: Recent Labs  Lab 08/24/2020 2058 09/14/20 0512  NA 135 136  K 4.8 4.5  CL 96* 98  CO2 25 24  GLUCOSE 227* 217*  BUN 14 15  CREATININE 0.83 0.96  CALCIUM 9.4 9.4  MG  --  1.8   GFR CrCl cannot be calculated (Unknown ideal weight.). Liver Function Tests: Recent Labs  Lab 08/29/2020 2058 09/14/20 0512  AST 24 14*  ALT 28 26  ALKPHOS 73 67  BILITOT 1.4*  1.4*  PROT 7.1 6.3*  ALBUMIN 3.0* 2.7*   Recent Labs  Lab 09/01/2020 2058  LIPASE 37   No results for input(s): AMMONIA in the last 168 hours. Coagulation profile No results for input(s): INR, PROTIME in the last 168 hours.  CBC: Recent Labs  Lab 09/17/2020 2058 09/14/20 0512  WBC 15.7* 18.8*  NEUTROABS  --  16.1*  HGB 16.8 14.8  HCT 51.9 45.5  MCV 92.3 92.5  PLT 225 245   Cardiac Enzymes: No results for input(s): CKTOTAL, CKMB, CKMBINDEX, TROPONINI in the last 168 hours. BNP (last 3 results) No results  for input(s): PROBNP in the last 8760 hours. CBG: Recent Labs  Lab 09/14/20 0740 09/14/20 1330 09/14/20 1709  GLUCAP 211* 164* 154*   D-Dimer: Recent Labs    09/02/2020 2058  DDIMER 1.68*   Hgb A1c: No results for input(s): HGBA1C in the last 72 hours. Lipid Profile: No results for input(s): CHOL, HDL, LDLCALC, TRIG, CHOLHDL, LDLDIRECT in the last 72 hours. Thyroid function studies: No results for input(s): TSH, T4TOTAL, T3FREE, THYROIDAB in the last 72 hours.  Invalid input(s): FREET3 Anemia work up: No results for input(s): VITAMINB12, FOLATE, FERRITIN, TIBC, IRON, RETICCTPCT in the last 72 hours. Sepsis Labs: Recent Labs  Lab 09/15/2020 2058 09/14/20 0512  PROCALCITON  --  0.31  WBC 15.7* 18.8*    Microbiology Recent Results (from the past 240 hour(s))  Culture, blood (routine x 2)     Status: None (Preliminary result)   Collection Time: 09/14/20  5:22 AM   Specimen: BLOOD LEFT HAND  Result Value Ref Range Status   Specimen Description BLOOD LEFT HAND  Final   Special Requests   Final    BOTTLES DRAWN AEROBIC AND ANAEROBIC Blood Culture results may not be optimal due to an inadequate volume of blood received in culture bottles   Culture   Final    NO GROWTH 1 DAY Performed at [facility], [place] [address] [place], [place] [address]    Report Status PENDING  Incomplete  Culture, blood (routine x 2)     Status: None (Preliminary result)   Collection Time: 09/14/20  5:36 AM   Specimen: BLOOD RIGHT HAND  Result Value Ref Range Status   Specimen Description BLOOD RIGHT HAND  Final   Special Requests   Final    BOTTLES DRAWN AEROBIC AND ANAEROBIC Blood Culture adequate volume   Culture   Final    NO GROWTH 1 DAY Performed at [facility], [place] [address] [place], [place] [address]    Report Status PENDING  Incomplete    Procedures and diagnostic studies:  No results found.  Medications:   . antiseptic oral rinse  15 mL Topical BID  .  morphine  injection  2 mg Intravenous Q3H   Continuous Infusions:   LOS: 2 days   Anthony [name]  [facility]   How to contact the [facility] Attending or Consulting provider [place] or covering provider during after hours [place], for this patient?  1. Check the care team in [facility] and look for a) attending/consulting TRH provider listed and b) the [facility] team listed 2. Log into [email or web address] and use [place]'s universal password to access. If you do not have the password, please contact the hospital operator. 3. Locate the [facility] provider you are looking for under [facility] and page to a number that you can be directly reached. 4. If you still have difficulty  reaching the provider, please page the [facility] (Director on Call) for the Hospitalists listed on amion for assistance.  09/16/2020, 8:54 AM

## 2020-09-17 ENCOUNTER — Other Ambulatory Visit: Payer: Self-pay

## 2020-09-17 DIAGNOSIS — J189 Pneumonia, unspecified organism: Secondary | ICD-10-CM | POA: Diagnosis not present

## 2020-09-17 DIAGNOSIS — I2609 Other pulmonary embolism with acute cor pulmonale: Secondary | ICD-10-CM | POA: Diagnosis not present

## 2020-09-17 DIAGNOSIS — Z515 Encounter for palliative care: Secondary | ICD-10-CM | POA: Diagnosis not present

## 2020-09-17 DIAGNOSIS — F0390 Unspecified dementia without behavioral disturbance: Secondary | ICD-10-CM | POA: Diagnosis not present

## 2020-09-17 NOTE — Progress Notes (Signed)
Progress Note    FALLOU HULBERT  RDE:[id number] DOB: [birth year]  DOA: 08/25/2020 PCP: Josetta Huddle, MD    Brief Narrative:     Medical records reviewed and are as summarized below:  Anthony [name] is an 86 y.o. male with past medical history of rheumatoid arthritis, systolic and diastolic congestive heart failure (Echo 03/2020 EF 45-50%), hyperlipidemia, Crohn's disease, dementia, gastroesophageal reflux disease, diabetes mellitus type 2 who presents to [facility] emergency department via EMS from [facility] assisted-living facility due to family concerns for chest pain and shortness of breath.  Of note, patient was recently hospitalized at [facility] from 9/27-10/5 for bilateral lower lobe infiltrates with leukocytosis concerning for pneumonia.  Patient was eventually discharged on 10/5 back to [facility] with home health hospice services. Found to have PE.  Has been transitioned to comfort care on 10/23.  Will be an in-hospital death.     Assessment/Plan:   Principal Problem:   Acute pulmonary embolism (HCC) Active Problems:   GERD without esophagitis   Chronic combined systolic and diastolic heart failure (HCC)   Essential hypertension   Pneumonia of both lower lobes due to infectious organism   Goals of care, counseling/discussion   Dementia without behavioral disturbance (HCC)   Pleuritic chest pain   Elevated troponin level not due myocardial infarction   Type 2 diabetes mellitus without complication, with long-term current use of insulin (HCC)   Pulmonary emboli (HCC)   DNR (do not resuscitate)   DNI (do not intubate)      Acute pulmonary embolism ([place]) in light of recent COVID 19 infection  CT angiogram of the chest identifies submassive right-sided pulmonary embolism, notable in the right main pulmonary artery with extension into the right upper and lower lobes with evidence of right heart strain  Transition to comfort  measures  Pneumonia of both lower lobes due to infectious organism  transition to comfort measures  Comfort focused, anticipate an in hospital death   Family Communication/Anticipated D/C date and plan/Code Status   Daughter [name] at bedside       Medical Consultants:    Palliative care  Subjective:   Appears comfortable  Objective:    Vitals:   09/16/20 0453 09/16/20 1305 09/17/20 0455 09/17/20 0804  BP: 116/76  109/72 112/71  Pulse: (!) 138  (!) 124 (!) 127  Resp: (!) 22  (!) 6   Temp: (!) 103 F (39.4 C) (!) 101.5 F (38.6 C) 98 F (36.7 C) 100.2 F (37.9 C)  TempSrc:  Axillary  Axillary  SpO2: 95%  93%     Intake/Output Summary (Last 24 hours) at 09/17/2020 1054 Last data filed at 09/17/2020 6314 Gross per 24 hour  Intake 0 ml  Output 500 ml  Net -500 ml   There were no vitals filed for this visit.  Exam: In bed, appears comfortable Not responsive to voice or touch Tachycardic Lung essential clear anteriorly   Data Reviewed:   I have personally reviewed following labs and imaging studies:  Labs: Labs show the following:   Basic Metabolic Panel: Recent Labs  Lab 08/24/2020 2058 09/14/20 0512  NA 135 136  K 4.8 4.5  CL 96* 98  CO2 25 24  GLUCOSE 227* 217*  BUN 14 15  CREATININE 0.83 0.96  CALCIUM 9.4 9.4  MG  --  1.8   GFR CrCl cannot be calculated (Unknown ideal weight.). Liver Function Tests: Recent Labs  Lab 09/19/2020 2058 09/14/20 9702  AST 24 14*  ALT 28 26  ALKPHOS 73 67  BILITOT 1.4* 1.4*  PROT 7.1 6.3*  ALBUMIN 3.0* 2.7*   Recent Labs  Lab 09/05/2020 2058  LIPASE 37   No results for input(s): AMMONIA in the last 168 hours. Coagulation profile No results for input(s): INR, PROTIME in the last 168 hours.  CBC: Recent Labs  Lab 09/12/2020 2058 09/14/20 0512  WBC 15.7* 18.8*  NEUTROABS  --  16.1*  HGB 16.8 14.8  HCT 51.9 45.5  MCV 92.3 92.5  PLT 225 245   Cardiac Enzymes: No results for input(s):  CKTOTAL, CKMB, CKMBINDEX, TROPONINI in the last 168 hours. BNP (last 3 results) No results for input(s): PROBNP in the last 8760 hours. CBG: Recent Labs  Lab 09/14/20 0740 09/14/20 1330 09/14/20 1709  GLUCAP 211* 164* 154*   D-Dimer: No results for input(s): DDIMER in the last 72 hours. Hgb A1c: No results for input(s): HGBA1C in the last 72 hours. Lipid Profile: No results for input(s): CHOL, HDL, LDLCALC, TRIG, CHOLHDL, LDLDIRECT in the last 72 hours. Thyroid function studies: No results for input(s): TSH, T4TOTAL, T3FREE, THYROIDAB in the last 72 hours.  Invalid input(s): FREET3 Anemia work up: No results for input(s): VITAMINB12, FOLATE, FERRITIN, TIBC, IRON, RETICCTPCT in the last 72 hours. Sepsis Labs: Recent Labs  Lab 09/12/2020 2058 09/14/20 0512  PROCALCITON  --  0.31  WBC 15.7* 18.8*    Microbiology Recent Results (from the past 240 hour(s))  Culture, blood (routine x 2)     Status: None (Preliminary result)   Collection Time: 09/14/20  5:22 AM   Specimen: BLOOD LEFT HAND  Result Value Ref Range Status   Specimen Description BLOOD LEFT HAND  Final   Special Requests   Final    BOTTLES DRAWN AEROBIC AND ANAEROBIC Blood Culture results may not be optimal due to an inadequate volume of blood received in culture bottles   Culture   Final    NO GROWTH 3 DAYS Performed at [facility], [place] [address] [place], [place] [address]    Report Status PENDING  Incomplete  Culture, blood (routine x 2)     Status: None (Preliminary result)   Collection Time: 09/14/20  5:36 AM   Specimen: BLOOD RIGHT HAND  Result Value Ref Range Status   Specimen Description BLOOD RIGHT HAND  Final   Special Requests   Final    BOTTLES DRAWN AEROBIC AND ANAEROBIC Blood Culture adequate volume   Culture   Final    NO GROWTH 3 DAYS Performed at [facility], [place] [address] [place],  [address]    Report Status PENDING  Incomplete    Procedures and diagnostic  studies:  No results found.  Medications:   . antiseptic oral rinse  15 mL Topical BID  .  morphine injection  2 mg Intravenous Q3H   Continuous Infusions:   LOS: 3 days   Geradine Girt  [facility]   How to contact the [facility] Attending or Consulting provider [place] or covering provider during after hours [place], for this patient?  1. Check the care team in [facility] and look for a) attending/consulting TRH provider listed and b) the [facility] team listed 2. Log into [email or web address] and use [place]'s universal password to access. If you do not have the password, please contact the hospital operator. 3. Locate the [facility] provider you are looking for under [facility] and page to a number  that you can be directly reached. 4. If you still have difficulty reaching the provider, please page the [facility] (Director on Call) for the Hospitalists listed on amion for assistance.  09/17/2020, 10:54 AM

## 2020-09-17 NOTE — Progress Notes (Signed)
Daily Progress Note   Patient Name: Anthony [name]       Date: 09/17/2020 DOB: 10-25-1934  Age: 86 y.o. MRN#: [id number] Attending Physician: Geradine Girt, DO Primary Care Physician: Josetta Huddle, MD Admit Date: 09/02/2020  Reason for Follow-up: terminal care, symptom check  Subjective: Patient appears comfortable. Daughter [name] is at bedside. She expresses appreciation that her father has been kept comfortable and for the care he has received on 6N.   I note that patient is still on 3L oxygen via nasal cannula. I provide counseling that oxygen does not promote comfort at EOL and can prolong the dying process, and recommend that it be removed. [name] agrees and shares that he never liked wearing oxygen anyway.   I also called daughter/[name] and provided update via phone. She was also agreeable to removing oxygen.   Length of Stay: 3  Current Medications: Scheduled Meds:  . antiseptic oral rinse  15 mL Topical BID  .  morphine injection  2 mg Intravenous Q3H       PRN Meds: acetaminophen **OR** acetaminophen, glycopyrrolate, haloperidol lactate, ipratropium-albuterol, LORazepam, morphine injection, ondansetron **OR** ondansetron (ZOFRAN) IV, polyvinyl alcohol  Physical Exam Vitals reviewed.  Pulmonary:     Effort: Pulmonary effort is normal.     Comments: Respirations even and unlabored Neurological:     Comments: Unresponsive to voice and light touch             Vital Signs: BP 112/71 (BP Location: Right Arm)   Pulse (!) 127   Temp 100.2 F (37.9 C) (Axillary)   Resp (!) 6   SpO2 93%  SpO2: SpO2: 93 % O2 Device: O2 Device: Nasal Cannula O2 Flow Rate: O2 Flow Rate (L/min): 3 L/min  Intake/output summary:   Intake/Output Summary (Last 24 hours) at 09/17/2020  1835 Last data filed at 09/17/2020 1659 Gross per 24 hour  Intake 0 ml  Output 800 ml  Net -800 ml         Palliative Assessment/Data: PPS 10%     Palliative Care Assessment & Plan   Patient Profile: 86 y.o.malewith past medical history of hypertension, CHF, hyperlipidemia, dementia, GERD, Crohn's disease, diabetes mellitus type 2 presented to the ED on 09/14/20 from [facility] due to family concerns for chest pain and shortness of breath. Patient recently  had COVID19 infection on 07/25/20 and was also recently hospitalized at [facility] from 9/27-10/5/21 for pneumonia. He was discharged on 08/27/20 back to [facility] with home hospice services with [facility].  Patient's daughters initially revoked hospice services in ED to allow work up and treatment.Patient was found to have acute PE and bilateral pneumonia.Family allowed a time limited trial of treatment withwatchful waiting and patient showed no improvements.   Assessment: Acute pulmonary embolism Systolic and diastolic heart failure Hypertension Pneumonia to bilateral lower lobes Dementia Type 2 diabetes mellitus Terminal care  Recommendations/Plan:  Continue full comfort care  D/C oxygen  Continue symptom management utilizing prn medications as ordered  PMT will continue to follow hoslitically  Goals of Care and Additional Recommendations:  Limitations on Scope of Treatment: Full Comfort Care  Code Status: DNR/DNI  Prognosis:   Hours - Days  Discharge Planning:  Anticipated Hospital Death  Care plan was discussed with bedside RN  Thank you for allowing the Palliative Medicine Team to assist in the care of this patient.   Total Time 15 minutes Prolonged Time Billed  no       Greater than 50%  of this time was spent counseling and coordinating care related to the above assessment and plan.  Lavena Bullion, NP  Please contact Palliative Medicine Team phone at [phone] for  questions and concerns.

## 2020-09-17 NOTE — Progress Notes (Addendum)
Manufacturing engineer [facility]) Hospitalized Hospice Patient  Anthony [name] is a current hospice patient who resides at [facility]. He was admitted onto service on 08/28/20 with a terminal diagnosis of Alzheimer's Dementia. Pt has been c/o right-sided chest pain intermittently for several days and was seen twice on 09/03/2020 by his hospice case Physiological scientist. Daughters activated EMS due to increased concern about pain and SOB. [facility] was made aware prior to transport. Pt transported by EMS to [facility] ED for evaluation and found to have pulmonary embolus with right heart strain. Per Dr. Tomasa Hosteller, [facility] MD, this is a related hospital admission.   Daughter [name] present in the room and requests that a visit not be made to her fathers room at this time. Report exchanged with hospital staff. Contacted [place], she advised that her father has been given hours > days to live and a hospital death is anticipated. She also requests that we not visit her father at this time.   V/S: 100.2 ax, 112/71, HR 127 Labs & diagnostics: full comfort measures IVs/PRNs: morphine infusion currently @ 2 mg/hr IV  Problem List: - pt transitioning- symptoms are well managed and family does not have any complaints/concerns about the care the hospital staff if providing  [place]: clear, full comfort D/C planning: hospital death, at this time family wishes to stay under hospice services but request no visitation Family: discussed with Stana Bunting IDT: hospice team updated  Thank you, Venia Carbon RN, BSN, [place] [facility] Liaison

## 2020-09-18 DIAGNOSIS — Z66 Do not resuscitate: Secondary | ICD-10-CM | POA: Diagnosis not present

## 2020-09-18 DIAGNOSIS — I2609 Other pulmonary embolism with acute cor pulmonale: Secondary | ICD-10-CM

## 2020-09-18 DIAGNOSIS — F0390 Unspecified dementia without behavioral disturbance: Secondary | ICD-10-CM

## 2020-09-19 LAB — CULTURE, BLOOD (ROUTINE X 2)
Culture: NO GROWTH
Culture: NO GROWTH
Special Requests: ADEQUATE

## 2020-09-23 NOTE — Death Summary Note (Addendum)
Death Summary  Anthony [name] [id number]:[id number] DOB: [birth year] DOA: September 20, 2020  PCP: Josetta Huddle, MD   Admit date: 09/20/20 Date of Death: Sep 25, 2020  Final Diagnoses:    Principal Problem:   Acute pulmonary embolism ([place]) Bilateral pneumonia Comfort measures only DNR/DNI   Active Problems:   GERD without esophagitis   Chronic combined systolic and diastolic heart failure (HCC)   Essential hypertension   Pneumonia of both lower lobes due to infectious organism   Goals of care, counseling/discussion   Dementia without behavioral disturbance (HCC)   Pleuritic chest pain   Elevated troponin level not due myocardial infarction   Type 2 diabetes mellitus without complication, with long-term current use of insulin (HCC)   Pulmonary emboli (HCC)   DNR (do not resuscitate)   DNI (do not intubate)      History of present illness:  86 year old male with history of rheumatoid arthritis, systolic and diastolic heart failure, hyperlipidemia, Crohn's disease, dementia, GERD, diabetes mellitus type 2 who was brought to [facility] by EMS for chest pain and shortness of breath.  Patient was recently hospitalized at [facility] from 08/19/2020 to 08/27/2020 for bilateral lower lobe infiltrates with leukocytosis concerning for pneumonia.  Patient was found to have pulmonary embolism and was transitioned to comfort care on 09/14/2020.  Hospital Course:   Acute pulmonary embolism-CTA chest showed submassive right-sided pulmonary embolism in the right main pulmonary artery with extension to right upper and lower lobes with evidence of right heart strain.  Patient was transitioned to comfort care only. Recent COVID-19 pneumonia-patient had recent COVID-19 pneumonia, transition to comfort measures only.  Palliative care was consulted for end-of-life care.  Patient today expired at 8:25 AM.    Time of death 8:25 AM.  Signed:  Oswald Hillock  [facility] 25-Sep-2020, 9:04  AM

## 2020-09-23 NOTE — Progress Notes (Signed)
In response to consult for PMT spiritual care, this chaplain reviewed the Pt. chart and learned the Pt. expired earlier this morning.  The chaplain phoned the unit and understands family is no longer bedside.

## 2020-09-23 NOTE — Progress Notes (Signed)
Patient expired. Daughter Anthony [name] at bedside. This RN and charge nurse verified and called time of death at 63. MD notified and made aware.

## 2020-09-23 DEATH — deceased
